# Patient Record
Sex: Male | Born: 1940
Health system: Southern US, Community
[De-identification: ages and names within clinical notes are randomized; demographics above are authoritative.]

## PROBLEM LIST (undated history)

## (undated) DIAGNOSIS — Z87442 Personal history of urinary calculi: Secondary | ICD-10-CM

## (undated) DIAGNOSIS — N400 Enlarged prostate without lower urinary tract symptoms: Secondary | ICD-10-CM

## (undated) DIAGNOSIS — I10 Essential (primary) hypertension: Secondary | ICD-10-CM

## (undated) DIAGNOSIS — N200 Calculus of kidney: Secondary | ICD-10-CM

## (undated) DIAGNOSIS — I2510 Atherosclerotic heart disease of native coronary artery without angina pectoris: Secondary | ICD-10-CM

## (undated) DIAGNOSIS — C801 Malignant (primary) neoplasm, unspecified: Secondary | ICD-10-CM

## (undated) DIAGNOSIS — Z85820 Personal history of malignant melanoma of skin: Secondary | ICD-10-CM

## (undated) DIAGNOSIS — N201 Calculus of ureter: Secondary | ICD-10-CM

## (undated) DIAGNOSIS — E119 Type 2 diabetes mellitus without complications: Secondary | ICD-10-CM

## (undated) DIAGNOSIS — Z9889 Other specified postprocedural states: Secondary | ICD-10-CM

## (undated) DIAGNOSIS — E785 Hyperlipidemia, unspecified: Secondary | ICD-10-CM

## (undated) HISTORY — DX: Calculus of kidney: N20.0

## (undated) HISTORY — PX: CARDIAC CATHETERIZATION: SHX172

## (undated) HISTORY — DX: Hyperlipidemia, unspecified: E78.5

## (undated) HISTORY — PX: TONSILLECTOMY: SUR1361

## (undated) HISTORY — DX: Malignant (primary) neoplasm, unspecified: C80.1

## (undated) HISTORY — DX: Essential (primary) hypertension: I10

## (undated) HISTORY — DX: Benign prostatic hyperplasia without lower urinary tract symptoms: N40.0

## (undated) NOTE — *Deleted (*Deleted)
Advanced Heart Failure Clinic Note   Referring Physician: PCP: Karie Schwalbe, MD PCP-Cardiologist: Nanetta Batty, MD  [facility]: Dr. Gala Romney   Reason for Visit: [facility] F/u, Chronic Systolic Heart Failure/ Ischemic Cardiomyopathy    HPI: Mr. Barret is a 25 y/o male w/ HTN, HLD, T2DM admitted 5/21 for LHC in the setting of new CP and abnormal NST. LHC showed severe multivessel CAD and he was referred for CABG. Preop echo showed moderately reduced LVEF, 35-40%, RV normal. No significant valvular disease. Underwent CABG x 3 (LIMA-LAD, SVG-diag, SVG-PLB) on 5/14 by Dr. Donata Clay.     Post operative course c/b atrial fibrillation + post cardiotomy shock requiring amiodarone and milrinone. Post op echo showed EF ~35% and mildly reduced RV systolic function.   I last saw him in 7/21.  He had been feeling great until he had mechanical fall with head trauma. We sent him for CT which was ok.    Echo 5/21: LVEF 35% w/ diffuse hypokinesis, RV systolic function mildly reduced   Review of Systems: Pertinent +/- outline in HPI    Past Medical History:  Diagnosis Date  . BPH (benign prostatic hypertrophy)   . Cancer (HCC)    melanoma  . Coronary artery disease   . History of kidney stones   . History of melanoma excision OCT 2010  . Hyperlipidemia   . Hypertension   . Left ureteral calculus   . Nephrolithiasis    bilateral  . Type 2 diabetes mellitus (HCC)     Current Outpatient Medications  Medication Sig Dispense Refill  . alum hydroxide-mag trisilicate (GAVISCON) 80-20 MG CHEW chewable tablet Chew 2 tablets by mouth 3 (three) times daily as needed for indigestion or heartburn.    Marland Kitchen aspirin EC 81 MG EC tablet Take 1 tablet (81 mg total) by mouth daily.    . Cholecalciferol (VITAMIN D3) 250 MCG (10000 UT) TABS Take 1 tablet by mouth 2 (two) times daily.    . Cyanocobalamin (VITAMIN B-12) 1000 MCG SUBL Place under the tongue.    . finasteride (PROSCAR) 5 MG tablet Take 1  tablet (5 mg total) by mouth daily. 90 tablet 3  . metFORMIN (GLUCOPHAGE-XR) 500 MG 24 hr tablet Take 1 tablet (500 mg total) by mouth 2 (two) times daily. 180 tablet 3  . Multiple Vitamin (MULTIVITAMIN) tablet Take 1 tablet by mouth daily.    Marland Kitchen omeprazole (PRILOSEC) 20 MG capsule Take 20 mg by mouth as needed.    . Potassium Citrate 15 MEQ (1620 MG) TBCR Take 1 tablet by mouth in the morning and at bedtime.    . rosuvastatin (CRESTOR) 10 MG tablet Take 10 mg by mouth daily.    . sacubitril-valsartan (ENTRESTO) 24-26 MG Take 1 tablet by mouth 2 (two) times daily. 60 tablet 3  . spironolactone (ALDACTONE) 25 MG tablet Take 1 tablet (25 mg total) by mouth daily. 30 tablet 3  . timolol (TIMOPTIC) 0.5 % ophthalmic solution Place 1 drop into both eyes 2 (two) times daily.      No current facility-administered medications for this encounter.    Allergies  Allergen Reactions  . Atorvastatin Other (See Comments)    myalgias  . Morphine And Related Other (See Comments)     hypotension  . Levofloxacin Rash  . Septra [Sulfamethoxazole-Trimethoprim] Rash      Social History   Socioeconomic History  . Marital status: Married    Spouse name: Not on file  . Number of  children: 2  . Years of education: Not on file  . Highest education level: Not on file  Occupational History  . 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 Occupation: Buys very distressed houses and rehabilitates  . Occupation: Does mediation  Tobacco Use  . Smoking status: Never Smoker  . Smokeless tobacco: Never Used  Substance and Sexual Activity  . Alcohol use: No  . Drug use: No  . Sexual activity: Not on file  Other Topics Concern  . Not on file  Social History Narrative   Has living will   Wife is health care POA---alternate is committee of physicians   Would accept resuscitation---wouldn't accept prolonged ventilation   No tube feeds if cognitively unaware   Social Determinants of Health   Financial  Resource Strain:   . Difficulty of Paying Living Expenses: Not on file  Food Insecurity:   . Worried About Programme researcher, broadcasting/film/video in the Last Year: Not on file  . Ran Out of Food in the Last Year: Not on file  Transportation Needs:   . Lack of Transportation (Medical): Not on file  . Lack of Transportation (Non-Medical): Not on file  Physical Activity:   . Days of Exercise per Week: Not on file  . Minutes of Exercise per Session: Not on file  Stress:   . Feeling of Stress : Not on file  Social Connections:   . Frequency of Communication with Friends and Family: Not on file  . Frequency of Social Gatherings with Friends and Family: Not on file  . Attends Religious Services: Not on file  . Active Member of Clubs or Organizations: Not on file  . Attends Banker Meetings: Not on file  . Marital Status: Not on file  Intimate Partner Violence:   . Fear of Current or Ex-Partner: Not on file  . Emotionally Abused: Not on file  . Physically Abused: Not on file  . Sexually Abused: Not on file      Family History  Problem Relation Age of Onset  . Hypertension Mother   . Diabetes Father   . Hypertension Father   . Colon polyps Brother   . Coronary artery disease Neg Hx   . Cancer Neg Hx   . Esophageal cancer Neg Hx   . Stomach cancer Neg Hx   . Rectal cancer Neg Hx     There were no vitals filed for this visit.   PHYSICAL EXAM: General:  Elderly male  HEENT: mild facial asymmetry with bruising on right temporal region Neck: supple. no JVD. Carotids 2+ bilat; no bruits. No lymphadenopathy or thyromegaly appreciated. Cor: PMI nondisplaced. Regular rate & rhythm. No rubs, gallops or murmurs. Lungs: clear Abdomen: soft, nontender, nondistended. No hepatosplenomegaly. No bruits or masses. Good bowel sounds. Extremities: no cyanosis, clubbing, rash, edema Neuro: alert & oriented x 3, cranial nerves grossly intact. moves all 4 extremities w/o difficulty. Affect pleasant.  Mild swaying with Romberg. Difficulty with heel-to-toe walk. FTN ok. No drift    ASSESSMENT & PLAN:  1. CAD - s/p CABG x 3 5/14 (LIMA-LAD, SVG-dig, SVG- PLB) - no s/s angina today - continue ASA + statin  - no ? blocker w/ recent shock. Can likely start soon - Attending CR   2.Chronic Systolic HF/ Ischemic Cardiomyopathy  - Echo 5/21 LVEF 35%-40%, RV systolic function mildly reduced, in the setting of multivessel CAD s/p CABG - Developed Post Cardiotomy Cardiogenic Shock following CABG and required short course of milrinone.   -  Volume status stable. Euvolemic on exam - NYHA Class II (prior to head injury) - Continue digoxin 0.125 mg. - Continue Entresto 24-26 bid. - Continue Spironolactone 25 mg daily  - Continue PRN Lasix 20 mg  - no ? blocker yet w/ recent shock  - consider addition of SGLT2i once Neuro situation stable    4. Post-operative Afib - maintaining NSR on amiodarone  - continue amio 100 daily (continuing for PVC suppression)  - no longer on Eliquis (d/c by PCP due to melena, now resolved). Colonoscopy 0/21 ok,    5. PVCs - noted to have frequent PVCs pos operatively  -  suppressed w/ amiodarone. Continue 100 mg daily   6. Head injury with imbalance  - unclear if due to ICH or post-concussive state. Will need stat head CT today.    Arvilla Meres, MD 05/12/20

---

## 1996-07-17 HISTORY — PX: PROSTATE BIOPSY: SHX241

## 1999-01-10 ENCOUNTER — Encounter: Payer: Self-pay | Admitting: General Surgery

## 1999-01-11 ENCOUNTER — Encounter: Payer: Self-pay | Admitting: General Surgery

## 1999-01-11 ENCOUNTER — Ambulatory Visit (HOSPITAL_COMMUNITY): Admission: RE | Admit: 1999-01-11 | Discharge: 1999-01-12 | Payer: Self-pay | Admitting: General Surgery

## 1999-01-15 HISTORY — PX: EXTRACORPOREAL SHOCK WAVE LITHOTRIPSY: SHX1557

## 1999-01-15 HISTORY — PX: CHOLECYSTECTOMY: SHX55

## 1999-01-28 ENCOUNTER — Encounter: Payer: Self-pay | Admitting: Urology

## 1999-01-28 ENCOUNTER — Ambulatory Visit (HOSPITAL_COMMUNITY): Admission: RE | Admit: 1999-01-28 | Discharge: 1999-01-28 | Payer: Self-pay | Admitting: Urology

## 1999-02-03 ENCOUNTER — Encounter: Payer: Self-pay | Admitting: Urology

## 1999-02-03 ENCOUNTER — Ambulatory Visit (HOSPITAL_COMMUNITY): Admission: RE | Admit: 1999-02-03 | Discharge: 1999-02-03 | Payer: Self-pay | Admitting: Urology

## 1999-04-21 ENCOUNTER — Encounter: Payer: Self-pay | Admitting: Urology

## 1999-04-21 ENCOUNTER — Ambulatory Visit (HOSPITAL_COMMUNITY): Admission: RE | Admit: 1999-04-21 | Discharge: 1999-04-21 | Payer: Self-pay | Admitting: Urology

## 2004-01-04 ENCOUNTER — Ambulatory Visit (HOSPITAL_COMMUNITY): Admission: RE | Admit: 2004-01-04 | Discharge: 2004-01-04 | Payer: Self-pay | Admitting: Urology

## 2004-02-03 ENCOUNTER — Ambulatory Visit (HOSPITAL_COMMUNITY): Admission: RE | Admit: 2004-02-03 | Discharge: 2004-02-03 | Payer: Self-pay | Admitting: *Deleted

## 2004-07-17 HISTORY — PX: ORIF CLAVICLE FRACTURE: SUR924

## 2005-06-15 ENCOUNTER — Emergency Department (HOSPITAL_COMMUNITY): Admission: EM | Admit: 2005-06-15 | Discharge: 2005-06-15 | Payer: Self-pay | Admitting: Emergency Medicine

## 2007-02-20 ENCOUNTER — Encounter: Admission: RE | Admit: 2007-02-20 | Discharge: 2007-02-20 | Payer: Self-pay | Admitting: Urology

## 2007-02-22 ENCOUNTER — Ambulatory Visit (HOSPITAL_BASED_OUTPATIENT_CLINIC_OR_DEPARTMENT_OTHER): Admission: RE | Admit: 2007-02-22 | Discharge: 2007-02-22 | Payer: Self-pay | Admitting: Urology

## 2007-02-22 HISTORY — PX: OTHER SURGICAL HISTORY: SHX169

## 2007-09-11 ENCOUNTER — Encounter: Admission: RE | Admit: 2007-09-11 | Discharge: 2007-09-11 | Payer: Self-pay | Admitting: Orthopaedic Surgery

## 2009-04-16 DIAGNOSIS — Z85820 Personal history of malignant melanoma of skin: Secondary | ICD-10-CM

## 2009-04-16 HISTORY — DX: Other specified postprocedural states: Z85.820

## 2009-09-14 LAB — HM DIABETES EYE EXAM

## 2009-12-17 ENCOUNTER — Encounter: Payer: Self-pay | Admitting: Internal Medicine

## 2010-01-11 ENCOUNTER — Ambulatory Visit: Payer: Self-pay | Admitting: Internal Medicine

## 2010-01-11 DIAGNOSIS — Z87442 Personal history of urinary calculi: Secondary | ICD-10-CM | POA: Insufficient documentation

## 2010-01-11 DIAGNOSIS — E785 Hyperlipidemia, unspecified: Secondary | ICD-10-CM | POA: Insufficient documentation

## 2010-01-11 DIAGNOSIS — I10 Essential (primary) hypertension: Secondary | ICD-10-CM | POA: Insufficient documentation

## 2010-01-11 DIAGNOSIS — E1121 Type 2 diabetes mellitus with diabetic nephropathy: Secondary | ICD-10-CM

## 2010-02-17 ENCOUNTER — Encounter: Payer: Self-pay | Admitting: Internal Medicine

## 2010-07-05 ENCOUNTER — Telehealth (INDEPENDENT_AMBULATORY_CARE_PROVIDER_SITE_OTHER): Payer: Self-pay | Admitting: *Deleted

## 2010-07-06 ENCOUNTER — Ambulatory Visit: Payer: Self-pay | Admitting: Internal Medicine

## 2010-07-06 LAB — CONVERTED CEMR LAB
Albumin: 4 g/dL (ref 3.5–5.2)
Basophils Absolute: 0 10*3/uL (ref 0.0–0.1)
CO2: 32 meq/L (ref 19–32)
Chloride: 102 meq/L (ref 96–112)
Cholesterol: 206 mg/dL — ABNORMAL HIGH (ref 0–200)
Creatinine,U: 182.7 mg/dL
Eosinophils Relative: 2.1 % (ref 0.0–5.0)
GFR calc non Af Amer: 58.47 mL/min — ABNORMAL LOW (ref >60.00)
HCT: 46.2 % (ref 39.0–52.0)
HDL: 33.8 mg/dL — ABNORMAL LOW (ref >39.00)
Hemoglobin: 15.7 g/dL (ref 13.0–17.0)
Lymphocytes Relative: 22.2 % (ref 12.0–46.0)
Lymphs Abs: 1.3 10*3/uL (ref 0.7–4.0)
Microalb Creat Ratio: 1.7 mg/g (ref 0.0–30.0)
Microalb, Ur: 3.1 mg/dL — ABNORMAL HIGH (ref 0.0–1.9)
Monocytes Relative: 9.5 % (ref 3.0–12.0)
Neutro Abs: 4 10*3/uL (ref 1.4–7.7)
PSA: 3.87 ng/mL (ref 0.10–4.00)
Potassium: 3.5 meq/L (ref 3.5–5.1)
RDW: 15.5 % — ABNORMAL HIGH (ref 11.5–14.6)
Sodium: 141 meq/L (ref 135–145)
TSH: 2.12 microintl units/mL (ref 0.35–5.50)
Total CHOL/HDL Ratio: 6
Total Protein: 6.6 g/dL (ref 6.0–8.3)
Triglycerides: 202 mg/dL — ABNORMAL HIGH (ref 0.0–149.0)
VLDL: 40.4 mg/dL — ABNORMAL HIGH (ref 0.0–40.0)
WBC: 6.1 10*3/uL (ref 4.5–10.5)

## 2010-07-13 ENCOUNTER — Telehealth (INDEPENDENT_AMBULATORY_CARE_PROVIDER_SITE_OTHER): Payer: Self-pay | Admitting: *Deleted

## 2010-07-13 ENCOUNTER — Ambulatory Visit: Payer: Self-pay | Admitting: Internal Medicine

## 2010-07-13 DIAGNOSIS — N400 Enlarged prostate without lower urinary tract symptoms: Secondary | ICD-10-CM

## 2010-07-13 LAB — HM DIABETES FOOT EXAM

## 2010-08-10 ENCOUNTER — Telehealth: Payer: Self-pay | Admitting: Internal Medicine

## 2010-08-16 NOTE — Letter (Signed)
Summary: Records Dated 02-20-07 thru 12-21-09/[facility]  Records Dated 02-20-07 thru 12-21-09/[facility]   Imported By: Lanelle Bal 03/17/2010 13:24:35  _____________________________________________________________________  External Attachment:    Type:   Image     Comment:   External Document

## 2010-08-16 NOTE — Assessment & Plan Note (Signed)
Summary: NEW PT TO EST/CLE   Vital Signs:  Patient profile:   70 year old male Height:      67 inches Weight:      171 pounds BMI:     26.88 Temp:     98.3 degrees F oral Pulse rate:   80 / minute Pulse rhythm:   regular BP sitting:   122 / 72  (left arm) Cuff size:   large  Vitals Entered By: Mervin Hack CMA Duncan Dull) (January 11, 2010 11:44 AM) CC: new patient to establish care   History of Present Illness: Lost doctor to retirement Lives close to here and doctor out at [place]  DM for 10-13 years working very hard to keep it under control  had slipped with eating and A1c up to 7.7% No hypoglycemic spells  Last chol showed slightly elevated triglycerides tried fish oil but had myalgias will be trying a different brand now  BPH  prostate biopsy negative in the 1990's unchanged nodule apparently no sig nocturia no sig daytime urgency  Several BP meds wonders if he takes too much No dizziness, etc  Multiple episodes of kidney stones Nothing recent  Preventive Screening-Counseling & Management  Alcohol-Tobacco     Smoking Status: never  Allergies (verified): 1)  ! Levaquin (Levofloxacin)  Past History:  Past Medical History: Diabetes mellitus, type II Hyperlipidemia Hypertension Nephrolithiasis, hx of-------------------------Dr Ottelin Melanoma  Past Surgical History: 1998 Prostate biopsy--negative 2000 Lithotripsy twice 2000 CHolecystectomy 2006 Left clavicle and rib fractures 2008 Kidney stones  2009 Right shoulder injury 2010 Melanoma right temple 2010 UTI  Family History: Dad died of lung problems @77   Had DM Mom died of metastatic breast cancer @79  2 brothers-- 1 died from cigarette, alcohol problems, etc No CAD ??HTN in parents also No colon or prostate cancer  Social History: 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 His daughter killed in MVA Never Smoked Alcohol use-no Occupation:  employed Smoking Status:  never  Review of Systems General:  weight down 5# recently from working harder sleeps fine wears seat belt. Eyes:  Denies double vision and vision loss-1 eye. ENT:  Denies decreased hearing and ringing in ears; teeth okay--regular with dentist. CV:  Denies chest pain or discomfort, difficulty breathing at night, difficulty breathing while lying down, fainting, lightheadness, palpitations, and shortness of breath with exertion. Resp:  Complains of cough; denies shortness of breath; occ cough from PND. GI:  Denies abdominal pain, bloody stools, change in bowel habits, dark tarry stools, indigestion, nausea, and vomiting. GU:  Denies nocturia, urinary frequency, and urinary hesitancy. MS:  Complains of muscle; denies joint pain and joint swelling; left leg pain for years--not clearly claudicaiton. Derm:  Complains of lesion(s); denies rash; new dark spot where melanoma was--going to derm today fungal toenails better with Vick's vaporub. Neuro:  Denies headaches, numbness, tingling, and weakness. Psych:  Denies anxiety and depression. Heme:  Denies abnormal bruising and enlarge lymph nodes. Allergy:  Denies seasonal allergies and sneezing.  Physical Exam  General:  alert and normal appearance.   Eyes:  pupils equal, pupils round, and pupils reactive to light.   Mouth:  no erythema and no exudates.   Neck:  supple, no masses, no thyromegaly, no carotid bruits, and no cervical lymphadenopathy.   Lungs:  normal respiratory effort and normal breath sounds.   Heart:  normal rate, regular rhythm, no murmur, and no  gallop.   Abdomen:  soft, non-tender, and no masses.   Msk:  normal ROM and no joint swelling.   Pulses:  1+ in feet Extremities:  no edema Neurologic:  alert & oriented X3, strength normal in all extremities, and gait normal.   Skin:  no rashes and no ulcerations.   Axillary  Nodes:  No palpable lymphadenopathy Psych:  normally interactive, good eye contact, not anxious appearing, and not depressed appearing.    Diabetes Management Exam:    Foot Exam (with socks and/or shoes not present):       Sensory-Pinprick/Light touch:          Left medial foot (L-4): normal          Left dorsal foot (L-5): normal          Left lateral foot (S-1): normal          Right medial foot (L-4): normal          Right dorsal foot (L-5): normal          Right lateral foot (S-1): normal       Inspection:          Left foot: abnormal             Comments: mild callous          Right foot: abnormal             Comments: mild callous       Nails:          Left foot: fungal infection          Right foot: fungal infection    Eye Exam:       Eye Exam done elsewhere          Date: 09/14/2009          Results: Early glaucoma, 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 Take 1 by mouth once daily    Metformin Hcl 500 Mg Tabs (Metformin hcl) .Marland Kitchen... Take 1 by mouth two times a day    Aspirin 81 Mg Tabs (Aspirin) .Marland Kitchen... Take 1 by mouth once daily  Problem # 2:  HYPERTENSION (ICD-401.9) Assessment: Unchanged good control ??wean in future  His updated medication list for this problem includes:    Triamterene-hctz 37.5-25 Mg Tabs (Triamterene-hctz) .Marland Kitchen... Take 1 by mouth once daily    Micardis 80 Mg Tabs (Telmisartan) .Marland Kitchen... Take 1 by mouth once daily    Dilt-xr 240 Mg Xr24h-cap (Diltiazem hcl) .Marland Kitchen... Take 1 by mouth once daily  BP today: 122/72  Problem # 3:  HYPERLIPIDEMIA (ICD-272.4) Assessment: Unchanged last LDL 81  His updated medication list for this problem includes:    Lipitor 10 Mg Tabs (Atorvastatin calcium)  .Marland Kitchen... Take 1 by mouth once daily  Problem # 4:  NEPHROLITHIASIS, HX OF (ICD-V13.01) Assessment: Comment Only on thiazide lots of fluids sees Dr Vernie Ammons  Complete Medication List: 1)  Triamterene-hctz 37.5-25 Mg Tabs (Triamterene-hctz) .... Take 1 by mouth once daily 2)  Finasteride 5 Mg Tabs (Finasteride) .... Take 1 by mouth once daily 3)  Micardis 80 Mg Tabs (Telmisartan) .... Take 1 by mouth once daily 4)  Metformin Hcl 500 Mg Tabs (  Metformin hcl) .... Take 1 by mouth two times a day 5)  Lipitor 10 Mg Tabs (Atorvastatin calcium) .... Take 1 by mouth once daily 6)  Aspirin 81 Mg Tabs (Aspirin) .... Take 1 by mouth once daily 7)  Dilt-xr 240 Mg Xr24h-cap (Diltiazem hcl) .... Take 1 by mouth once daily 8)  Klor-con 10 10 Meq Cr-tabs (Potassium chloride) .... Take 1 by mouth three times a day  Other Orders: Tdap => 80yrs IM (16109) Admin 1st Vaccine (60454) Admin 1st Vaccine [facility]) [phone])  Patient Instructions: 1)  Please get last 3 years of records from prior physician--plus colonoscopy and other radiology tests 2)  Please schedule a follow-up appointment in 6 months .   Current Allergies (reviewed today): ! LEVAQUIN (LEVOFLOXACIN)   Tetanus/Td Immunization History:    Tetanus/Td # 1:  Tdap (01/11/2010)  Influenza Immunization History:    Influenza # 1:  Historical (05/17/2009)  Pneumovax Immunization History:    Pneumovax # 1:  Historical (02/20/2006)  Hepatitis A Immunization History:    Hep A # 1:  Historical (06/17/2003)    Hep A # 2:  Historical (02/15/2004)  Other Immunization History:    Zostavax # 1:  Zostavax (06/28/2005)  Tetanus/Td Vaccine    Vaccine Type: Tdap    Site: left deltoid    Mfr: GlaxoSmithKline    Dose: 0.5 ml    Route: IM    Given by: Mervin Hack CMA (AAMA)    Exp. Date: 10/08/2011    Lot #: [id number]    VIS given: 06/04/07 version given January 11, 2010.   Colonoscopy  Procedure date:  01/18/2004  Findings:       Results:  Normal.

## 2010-08-16 NOTE — Progress Notes (Signed)
Summary: Patient Problem & Med List  Patient Problem & Med List   Imported By: Lanelle Bal 01/18/2010 12:07:32  _____________________________________________________________________  External Attachment:    Type:   Image     Comment:   External Document

## 2010-08-16 NOTE — Letter (Signed)
Summary: Patient Questionnaire  Patient Questionnaire   Imported By: Beau Fanny 01/12/2010 11:03:45  _____________________________________________________________________  External Attachment:    Type:   Image     Comment:   External Document

## 2010-08-18 NOTE — Progress Notes (Signed)
Summary: refill request for micardis  Phone Note Refill Request Call back at Home Phone [phone] Message from:  Patient  Refills Requested: Medication #1:  MICARDIS 80 MG TABS take 1 by mouth once daily Pt's insurance has changed and he is asking for a written script to send to mail order.  Please call when ready.  Initial call taken by: Lowella Petties CMA, AAMA,  August 10, 2010 4:02 PM  Follow-up for Phone Call        Rx written Follow-up by: Cindee Salt MD,  August 11, 2010 12:33 PM  Additional Follow-up for Phone Call Additional follow up Details #1::        left message on machine that rx ready for pick-up  Additional Follow-up by: DeShannon Smith CMA Duncan Dull),  August 11, 2010 1:56 PM    Prescriptions: MICARDIS 80 MG TABS (TELMISARTAN) take 1 by mouth once daily  #90 x 3   Entered and Authorized by:   Cindee Salt MD   Signed by:   Cindee Salt MD on 08/11/2010   Method used:   Print then Give to Patient   RxID:   [id number]

## 2010-08-18 NOTE — Progress Notes (Signed)
----   Converted from flag ---- ---- 07/04/2010 1:32 PM, Cindee Salt MD wrote: lipid, hepatic--- 272.4 renal, TSH, CBC with diff--  401.9 HgbA1c, urine microal-- 250.00  ---- 07/04/2010 12:46 PM, Liane Comber CMA (AAMA) wrote: Lab orders please! Good Morning! This pt is scheduled for cpx labs Wed, which labs to draw and dx codes to use? Pt says you gave him permission to have labs prior to appt. Thanks Tasha ------------------------------

## 2010-08-18 NOTE — Assessment & Plan Note (Signed)
Summary: 6 MONTH FOLLOW UP/[facility]   Vital Signs:  Patient profile:   70 year old male Weight:      170 pounds Temp:     98.3 degrees F oral BP sitting:   140 / 82  (left arm) Cuff size:   large  Vitals Entered By: Mervin Hack CMA Duncan Dull) (July 13, 2010 11:02 AM) CC: 6  month follow-up   History of Present Illness: Doing fairly well  Has a cluster of kidney stones on right may need surgery to get them out has failed past lithotripsy  Hopes to cut out maltodextrin in sugar free products Doesn't regularly check sugars No hypoglycemia   stopped the lipitor due to myalgias feels better now that he is off that [facility]  Has noted that his semen production has gone down  Wonders about cutting down on proscar Voids fine started years ago as start of prostate prevention strategy  BP has been fairly low (like at other doctors) No chest pain No SOB  Allergies: 1)  ! Levaquin (Levofloxacin) 2)  Lipitor (Atorvastatin Calcium)  Past History:  Past medical, surgical, family and social histories (including risk factors) reviewed for relevance to current acute and chronic problems.  Past Medical History: Diabetes mellitus, type II Hyperlipidemia Hypertension Nephrolithiasis, hx of-------------------------Dr Dahlstedt Melanoma Benign prostatic hypertrophy  Past Surgical History: 1998 Prostate biopsy--negative 2000 Lithotripsy twice 2000 CHolecystectomy 2006 Left clavicle and rib fractures 2008 Kidney stones  2009 Right shoulder injury 2010 Melanoma right temple 2010 UTI 2010 Melanoma -left forehead  Family History: Reviewed history from 01/11/2010 and no changes required. Dad died of lung problems @77   Had DM Mom died of metastatic breast cancer @79  2 brothers-- 1 died from cigarette, alcohol problems, etc No CAD ??HTN in parents also No colon or prostate cancer  Social History: Reviewed history from 01/11/2010  and no changes required. 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 His daughter killed in MVA Never Smoked Alcohol use-no  Review of Systems       weight is stable sleeping well  Physical Exam  General:  alert and normal appearance.   Neck:  supple, no masses, no thyromegaly, no carotid bruits, and no cervical lymphadenopathy.   Lungs:  normal respiratory effort, no intercostal retractions, no accessory muscle use, and normal breath sounds.   Heart:  normal rate, regular rhythm, no murmur, and no gallop.   Pulses:  1+ in feet Extremities:  no edema Skin:  no suspicious lesions and no ulcerations.   Psych:  normally interactive, good eye contact, not anxious appearing, and not depressed appearing.    Diabetes Management Exam:    Foot Exam (with socks and/or shoes not present):       Sensory-Pinprick/Light touch:          Left medial foot (L-4): normal          Left dorsal foot (L-5): normal          Left lateral foot (S-1): normal          Right medial foot (L-4): normal          Right dorsal foot (L-5): normal          Right lateral foot (S-1): normal       Inspection:          Left foot: abnormal  Comments: early plantar callous          Right foot: normal       Nails:          Left foot: thickened          Right foot: thickened   Impression & Recommendations:  Problem # 1:  DIABETES MELLITUS, TYPE II (ICD-250.00) Assessment Unchanged good control he will continue to work on lifestyle  His updated medication list for this problem includes:    Micardis 80 Mg Tabs (Telmisartan) .Marland Kitchen... Take 1 by mouth once daily    Metformin Hcl 500 Mg Tabs (Metformin hcl) .Marland Kitchen... Take 1 by mouth two times a day    Aspirin 81 Mg Tabs (Aspirin) .Marland Kitchen... Take 1 by mouth once daily  Labs Reviewed: Creat: 1.3 (07/06/2010)     Last Eye Exam: Early glaucoma, no  retinopathy (09/14/2009) Reviewed HgBA1c results: 6.8 (07/06/2010)  Problem # 2:  HYPERTENSION (ICD-401.9) Assessment: Unchanged reasonable control no changes  His updated medication list for this problem includes:    Triamterene-hctz 37.5-25 Mg Tabs (Triamterene-hctz) .Marland Kitchen... Take 1 by mouth once daily    Micardis 80 Mg Tabs (Telmisartan) .Marland Kitchen... Take 1 by mouth once daily    Dilt-xr 240 Mg Xr24h-cap (Diltiazem hcl) .Marland Kitchen... Take 1 by mouth once daily  BP today: 140/82 Prior BP: 122/72 (01/11/2010)  Labs Reviewed: K+: 3.5 (07/06/2010) Creat: : 1.3 (07/06/2010)   Chol: 206 (07/06/2010)   HDL: 33.80 (07/06/2010)   TG: 202.0 (07/06/2010)  Problem # 3:  HYPERLIPIDEMIA (ICD-272.4) Assessment: Comment Only clear cut myalgias on lipitor will work on lifestyle  consider pravastatin next time if LDL still >130  The following medications were removed from the medication list:    Lipitor 10 Mg Tabs (Atorvastatin calcium) .Marland Kitchen... Take 1 by mouth once daily  Labs Reviewed: SGOT: 26 (07/06/2010)   SGPT: 28 (07/06/2010)   HDL:33.80 (07/06/2010)  Chol:206 (07/06/2010)  Trig:202.0 (07/06/2010)  Problem # 4:  BENIGN PROSTATIC HYPERTROPHY (ICD-600.00) Assessment: Improved no symptoms will try to wean finasteride  Complete Medication List: 1)  Triamterene-hctz 37.5-25 Mg Tabs (Triamterene-hctz) .... Take 1 by mouth once daily 2)  Finasteride 5 Mg Tabs (Finasteride) .... Take 1 by mouth once every other day 3)  Micardis 80 Mg Tabs (Telmisartan) .... Take 1 by mouth once daily 4)  Metformin Hcl 500 Mg Tabs (Metformin hcl) .... Take 1 by mouth two times a day 5)  Aspirin 81 Mg Tabs (Aspirin) .... Take 1 by mouth once daily 6)  Dilt-xr 240 Mg Xr24h-cap (Diltiazem hcl) .... Take 1 by mouth once daily 7)  Klor-con 10 10 Meq Cr-tabs (Potassium chloride) .... Take 1 by mouth three times a day  Patient Instructions: 1)  Okay to try the finasteride every other day 2)  Please schedule a follow-up  appointment in 6 months for Medicare Wellness visit   Orders Added: 1)  Est. Patient Level IV [14782]    Current Allergies (reviewed today): ! LEVAQUIN (LEVOFLOXACIN) LIPITOR (ATORVASTATIN CALCIUM)

## 2010-08-18 NOTE — Progress Notes (Signed)
Summary: Repeated PSA per pts request-pt aware may hv to pay out of pocke  Phone Note Outgoing Call   Summary of Call: Pt request a PSA repeated in . Per Dr. Alphonsus Sias if not elevated ins will not pay.  Told pt per Dr. Alphonsus Sias, pt wants the test and is willing to pay out of pocket.Daine Gip  July 13, 2010 1:57 PM  Initial call taken by: Daine Gip,  July 13, 2010 1:58 PM

## 2010-08-20 ENCOUNTER — Emergency Department (HOSPITAL_COMMUNITY): Payer: Medicare Other

## 2010-08-20 ENCOUNTER — Emergency Department (HOSPITAL_COMMUNITY)
Admission: EM | Admit: 2010-08-20 | Discharge: 2010-08-20 | Disposition: A | Discharge status: Home / Self Care | Payer: Medicare Other | Attending: Emergency Medicine | Admitting: Emergency Medicine

## 2010-08-20 ENCOUNTER — Encounter: Payer: Self-pay | Admitting: Internal Medicine

## 2010-08-20 DIAGNOSIS — E119 Type 2 diabetes mellitus without complications: Secondary | ICD-10-CM | POA: Insufficient documentation

## 2010-08-20 DIAGNOSIS — I10 Essential (primary) hypertension: Secondary | ICD-10-CM | POA: Insufficient documentation

## 2010-08-20 DIAGNOSIS — N1330 Unspecified hydronephrosis: Secondary | ICD-10-CM | POA: Insufficient documentation

## 2010-08-20 DIAGNOSIS — N201 Calculus of ureter: Secondary | ICD-10-CM | POA: Insufficient documentation

## 2010-08-20 DIAGNOSIS — R109 Unspecified abdominal pain: Secondary | ICD-10-CM | POA: Insufficient documentation

## 2010-08-20 LAB — CBC
MCHC: 35.1 g/dL (ref 30.0–36.0)
Platelets: 311 10*3/uL (ref 150–400)
RDW: 14.8 % (ref 11.5–15.5)
WBC: 10.1 10*3/uL (ref 4.0–10.5)

## 2010-08-20 LAB — POCT I-STAT, CHEM 8
Creatinine, Ser: 1.6 mg/dL — ABNORMAL HIGH (ref 0.4–1.5)
Glucose, Bld: 226 mg/dL — ABNORMAL HIGH (ref 70–99)
HCT: 47 % (ref 39.0–52.0)
Hemoglobin: 16 g/dL (ref 13.0–17.0)
Potassium: 3.6 mEq/L (ref 3.5–5.1)
Sodium: 139 mEq/L (ref 135–145)
TCO2: 25 mmol/L (ref 0–100)

## 2010-08-20 LAB — DIFFERENTIAL
Basophils Absolute: 0 10*3/uL (ref 0.0–0.1)
Basophils Relative: 0 % (ref 0–1)
Eosinophils Relative: 0 % (ref 0–5)
Lymphocytes Relative: 11 % — ABNORMAL LOW (ref 12–46)
Monocytes Absolute: 0.7 10*3/uL (ref 0.1–1.0)

## 2010-08-20 LAB — URINALYSIS, ROUTINE W REFLEX MICROSCOPIC
Nitrite: POSITIVE — AB
Protein, ur: NEGATIVE mg/dL
Specific Gravity, Urine: 1.024 (ref 1.005–1.030)
Urobilinogen, UA: 0.2 mg/dL (ref 0.0–1.0)

## 2010-08-20 LAB — URINE MICROSCOPIC-ADD ON

## 2010-08-21 LAB — URINE CULTURE: Culture: NO GROWTH

## 2010-08-29 ENCOUNTER — Telehealth: Payer: Self-pay | Admitting: Internal Medicine

## 2010-09-05 ENCOUNTER — Other Ambulatory Visit: Payer: Self-pay | Admitting: Urology

## 2010-09-05 DIAGNOSIS — N200 Calculus of kidney: Secondary | ICD-10-CM

## 2010-09-07 NOTE — Progress Notes (Signed)
Summary: CD of tests  Phone Note Call from Patient Call back at Home Phone [phone]   Caller: Patient Call For: Cindee Salt MD Summary of Call: Pt dropped off CD of chest x-ray and CT of abdomen, per pt he would like for Dr.Gelisa Tieken to look at to make sure everything is ok. Pt states he passed a kidney stone and now [facility] say they see something on the CT? pt would like a phone call, CD on your desk.  ( I didn't speak with pt, front desk spoke with pt) Initial call taken by: Mervin Hack CMA Duncan Dull),  August 29, 2010 9:44 AM  Follow-up for Phone Call        called home--wife gave me his cell number--  [phone]   He passed the obstructing stone on the left has urology appt tomorrow to discuss plans (surgery to clean out right, ?lithotripsy on left?)  Was told to hold breath on CT scan, lung abnormalities are probably just atelectasis Would just recheck CXR at some point Follow-up by: Cindee Salt MD,  August 29, 2010 2:12 PM

## 2010-09-21 ENCOUNTER — Encounter (HOSPITAL_COMMUNITY): Payer: Medicare Other

## 2010-09-21 ENCOUNTER — Other Ambulatory Visit: Payer: Self-pay | Admitting: Urology

## 2010-09-21 DIAGNOSIS — Z01810 Encounter for preprocedural cardiovascular examination: Secondary | ICD-10-CM | POA: Insufficient documentation

## 2010-09-21 LAB — BASIC METABOLIC PANEL
CO2: 29 mEq/L (ref 19–32)
Calcium: 9.6 mg/dL (ref 8.4–10.5)
GFR calc Af Amer: >60 mL/min (ref >60)
GFR calc non Af Amer: >60 mL/min (ref >60)
Potassium: 3.7 mEq/L (ref 3.5–5.1)
Sodium: 142 mEq/L (ref 135–145)

## 2010-09-21 LAB — SURGICAL PCR SCREEN: MRSA, PCR: NEGATIVE

## 2010-09-21 LAB — CBC
Hemoglobin: 14.4 g/dL (ref 13.0–17.0)
MCHC: 33.3 g/dL (ref 30.0–36.0)
WBC: 6.1 10*3/uL (ref 4.0–10.5)

## 2010-09-26 ENCOUNTER — Telehealth: Payer: Self-pay | Admitting: Internal Medicine

## 2010-09-28 ENCOUNTER — Ambulatory Visit (HOSPITAL_COMMUNITY): Payer: Medicare Other

## 2010-09-28 ENCOUNTER — Ambulatory Visit (HOSPITAL_COMMUNITY)
Admission: RE | Admit: 2010-09-28 | Discharge: 2010-09-30 | Disposition: A | Discharge status: Home / Self Care | Payer: Medicare Other | Source: Ambulatory Visit | Attending: Urology | Admitting: Urology

## 2010-09-28 ENCOUNTER — Ambulatory Visit (HOSPITAL_COMMUNITY)
Admission: RE | Admit: 2010-09-28 | Discharge: 2010-09-28 | Disposition: A | Discharge status: Home / Self Care | Payer: Medicare Other | Source: Ambulatory Visit | Attending: Urology | Admitting: Urology

## 2010-09-28 DIAGNOSIS — I10 Essential (primary) hypertension: Secondary | ICD-10-CM | POA: Insufficient documentation

## 2010-09-28 DIAGNOSIS — N200 Calculus of kidney: Secondary | ICD-10-CM

## 2010-09-28 DIAGNOSIS — E119 Type 2 diabetes mellitus without complications: Secondary | ICD-10-CM | POA: Insufficient documentation

## 2010-09-28 DIAGNOSIS — Z7982 Long term (current) use of aspirin: Secondary | ICD-10-CM | POA: Insufficient documentation

## 2010-09-28 DIAGNOSIS — Z79899 Other long term (current) drug therapy: Secondary | ICD-10-CM | POA: Insufficient documentation

## 2010-09-28 DIAGNOSIS — R82998 Other abnormal findings in urine: Secondary | ICD-10-CM | POA: Insufficient documentation

## 2010-09-28 DIAGNOSIS — Z01818 Encounter for other preprocedural examination: Secondary | ICD-10-CM | POA: Insufficient documentation

## 2010-09-28 DIAGNOSIS — Z01812 Encounter for preprocedural laboratory examination: Secondary | ICD-10-CM | POA: Insufficient documentation

## 2010-09-28 HISTORY — PX: NEPHROLITHOTOMY: SUR881

## 2010-09-28 LAB — TYPE AND SCREEN: Antibody Screen: NEGATIVE

## 2010-09-28 LAB — GLUCOSE, CAPILLARY
Glucose-Capillary: 138 mg/dL — ABNORMAL HIGH (ref 70–99)
Glucose-Capillary: 196 mg/dL — ABNORMAL HIGH (ref 70–99)

## 2010-09-28 LAB — ABO/RH: ABO/RH(D): O POS

## 2010-09-28 MED ORDER — IOHEXOL 300 MG/ML  SOLN: 50.0000 mL | Freq: Once | INTRAMUSCULAR | Status: AC | PRN

## 2010-09-29 ENCOUNTER — Ambulatory Visit (HOSPITAL_COMMUNITY): Payer: Medicare Other

## 2010-09-29 LAB — CBC
HCT: 35.3 % — ABNORMAL LOW (ref 39.0–52.0)
MCH: 30.1 pg (ref 26.0–34.0)
MCHC: 33.7 g/dL (ref 30.0–36.0)
MCV: 89.4 fL (ref 78.0–100.0)
Platelets: 227 10*3/uL (ref 150–400)
RDW: 15 % (ref 11.5–15.5)
WBC: 9.9 10*3/uL (ref 4.0–10.5)

## 2010-10-04 NOTE — Progress Notes (Signed)
Summary:   DILT-XR 240  Phone Note Refill Request Message [facility]:  [phone] on September 26, 2010 9:12 AM  Refills Requested: Medication #1:  DILT-XR 240 MG XR24H-CAP take 1 by mouth once daily   Last Refilled: 05/27/2010 E-Scribe Request, we've never filled before, ok to refill?   Method Requested: Electronic Initial call taken by: Mervin Hack CMA Duncan Dull),  September 26, 2010 9:13 AM  Follow-up for Phone Call        yes, okay to fill for 1 year Follow-up by: Cindee Salt MD,  September 26, 2010 10:01 AM  Additional Follow-up for Phone Call Additional follow up Details #1::        Rx faxed to pharmacy Additional Follow-up by: DeShannon Smith CMA Duncan Dull),  September 26, 2010 1:57 PM    Prescriptions: DILT-XR 240 MG XR24H-CAP (DILTIAZEM HCL) take 1 by mouth once daily  #90 x 3   Entered by:   Mervin Hack CMA (AAMA)   Authorized by:   Cindee Salt MD   Signed by:   Mervin Hack CMA (AAMA) on 09/26/2010   Method used:   [facility]        [facility]  [address] [phone] ([facility]ail)       [address]       [place], [place]  [address]       Ph: [phone] or [phone]       Fax: [phone]   RxID:   [id number]

## 2010-11-01 NOTE — Op Note (Signed)
NAME:  James Braun, James Braun NO.:  [id number]  MEDICAL RECORD NO.:  [id number]           PATIENT TYPE:  O  LOCATION:  DAYL                         FACILITY:  [facility]  PHYSICIAN:  Bertram Millard. Nikodem Leadbetter, M.D.DATE OF BIRTH:  1940/10/16  DATE OF PROCEDURE: DATE OF DISCHARGE:                              OPERATIVE REPORT   PREOPERATIVE DIAGNOSIS:  Multiple right renal calculi.  POSTOPERATIVE DIAGNOSIS:  Multiple right renal calculi.  PRINCIPAL PROCEDURE:  Right percutaneous nephrolithotomy.  SURGEON:  Bertram Millard. Elveria Lauderbaugh, M.D.  ANESTHESIA:  General endotracheal.  COMPLICATIONS:  None.  ESTIMATED BLOOD LOSS:  Less than 200 mL.  SPECIMENS:  Multiple stones, the family.  BRIEF HISTORY:  This 70 year old male has a history of recurrent urolithiasis.  He has multiple enlarging stones in right upper pole calyces.  He also has chronic pyuria.  It is felt that these stones may well be infected, and he is intermittently symptomatic.  Due to the large number of the stones, it was recommended that he undergo percutaneous nephrolithotomy if he were to be relieved of the stone burden.  I also discussed other options including lithotripsy and ureteroscopic extraction.  Because of the location and the size of the stones, I felt like percutaneous nephrolithotomy was the preferred method.  We talked about risks and complications of the procedure.  He understands these and desires to proceed.  DESCRIPTION OF PROCEDURE:  The patient was identified in the holding area.  He had undergone right percutaneous nephrostomy tube placement in the interventional radiology by Dr. Richarda Overlie.  He was then taken to the operating room where general anesthetic was administered.  The endotracheal device was placed.  He was then placed in the prone position after his bladder was catheterized.  All extremities were padded carefully.  His right flank was prepped and draped.  I then, through the  previously placed nephrostomy tube, eventually placed 2 guide wires through his ureter and into his pelvis using fluoroscopic guidance and a peel-away sheath.  The peel-away sheath was removed, and then I dilated his nephrostomy tract with a Trackmaster or NephroMax balloon under fluoroscopic guidance.  28-French nephrostomy sheath was then placed over top of the balloon and into the upper pole calyces. There was a fair amount of bleeding at first, but once this abated somewhat, multiple calculi were seen.  These were all grasped and extracted.  They were somewhat friable, but after approximately 45 minutes of work, the patient's calyces had been rendered stone free.  I saw no further calcifications using fluoroscopic guidance.  Small clots were picked out, and these had no fragments on them.  Inspection of the renal pelvis, upper ureter and all the calyces that I could get to, revealed no evidence of further stones.  At this point, an 18-French Council-tip catheter was placed over the working guidewire and guided into position in the upper pole calyces.  Antegrade nephrostogram was then performed.  There was minimal extravasation retrograde through the nephrostomy tract.  There was no evidence of extravasation on upper pole calyces or on the pelvis.  There was easy flow of contrast down  the ureter on the right.  At this point, 3 mL of water was placed in the catheter balloon.  The catheter was again found to be adequately positioned within the pelvis using fluoroscopy.  I then sutured the catheter to the skin with 2-0 silk sutures.  The nephrostomy sheath was then removed, and the catheter was left in place.  Dry sterile dressings were placed.  At this point, the procedure was terminated.  The patient was awakened after dry sterile dressing was placed.  Taken to PACU in stable condition.     Bertram Millard. Jazelle Achey, M.D.     SMD/MEDQ  D:  09/28/2010  T:  09/28/2010  Job:   [id number]  Electronically Signed by Marcine Matar M.D. on 11/01/2010 01:16:17 PM

## 2010-11-21 ENCOUNTER — Other Ambulatory Visit: Payer: Self-pay | Admitting: *Deleted

## 2010-11-21 MED ORDER — POTASSIUM CHLORIDE 10 MEQ PO TBCR: 30.0000 meq | EXTENDED_RELEASE_TABLET | Freq: Every day | ORAL | Status: DC

## 2010-11-22 ENCOUNTER — Other Ambulatory Visit: Payer: Self-pay | Admitting: *Deleted

## 2010-11-22 MED ORDER — POTASSIUM CHLORIDE 10 MEQ PO TBCR: 30.0000 meq | EXTENDED_RELEASE_TABLET | Freq: Every day | ORAL | Status: DC

## 2010-11-29 NOTE — Op Note (Signed)
NAME:  James Braun, James Braun                ACCOUNT NO.:  [id number]   MEDICAL RECORD NO.:  [id number]          PATIENT TYPE:  AMB   LOCATION:  [facility]                         FACILITY:  [facility]   PHYSICIAN:  Ronald L. Earlene Plater, M.D.  DATE OF BIRTH:  06-Aug-1940   DATE OF PROCEDURE:  02/22/2007  DATE OF DISCHARGE:  02/22/2007                               OPERATIVE REPORT   PREOPERATIVE DIAGNOSIS:  Left ureteral stone.   POSTOPERATIVE DIAGNOSIS:  Left ureteral stone.   PROCEDURE PERFORMED:  1. Cystoscopy.  2. Left retrograde pyelography.  3. Left uteroscopic stone manipulation with laser lithotripsy and      basket extraction.   SURGEON:  Dr. Gaynelle Arabian.   RESIDENT:  Melina Schools.   ANESTHESIA:  General.   INDICATION FOR PROCEDURE:  [name] is a 70 year old male who is noted  to have a left ureteral stone.  This was symptomatic.  He failed a trial  of conservative management.  He presents for surgical management.   DESCRIPTION OF PROCEDURE IN DETAIL:  The patient brought to the  operating room, identified by his arm band.  Informed consent was  verified, and preoperative time-out was performed.  After the successful  induction of general anesthesia, the patient is moved to the dorsal  lithotomy position.  All appropriate pressure points are padded and  confirmed to avoid compartment syndrome.  Perineum is prepped and  draped.  Surgeon changes gown and gloves.  __________ [place] scope  sheath is used to introduce the 12-degree cystoscope __________ in the  bladder.  Pancystourethroscopy was performed.  Bilateral ureteral  orifices were noted to be in normal anatomic position __________ .  The  remainder of the bladder was free of any mucosal lesions, erythema,  foreign bodies, diverticula, stones.  Attention was turned to the left  ureteral orifice.  It was cannulated with an end-hole catheter.  Contrast was injected, and the left retrograde pyelogram was performed.   Left retrograde  pyelogram revealed a filling defect in the mid ureter  consistent with stone.  There was proximal mild hydroureteronephrosis.  There were no additional filling defects.   Sensor wire was then advanced via the Seldinger technique into the renal  pelvis.  The scope and the end-hole catheter were removed.  Six French  short semi-rigid ureteroscope was driven under direct vision into the  ureter.  It was driven under direct vision to the level of the stone.  The stone was then systematically fragmented with the holmium laser.  The 270 micron fiber was set initially at 0.5 joules at 5 Hz and  increased to 0.62 at 8 Hz from maximum power of 4.8 watts.  Once the  stone was sufficiently fragmented, a nitinol basket was used to retrieve  all the stone fragments.  These were deposited in the bladder.  The  ureteroscope was then driven to a level of the UPJ, and there are no  additional stones seen.  The ureteroscope was removed.  A 6.6 double-J  ureteral stent was advanced over the wire into the renal pelvis, and the  wire was removed.  The bladder was drained.  The procedure was  terminated.  The patient tolerated the procedure well, and there were no  complications.  [name] was the attending primary responsible  physician who was present and participated in all aspects of the  procedure.   DISPOSITION:  The patient was extubated uneventfully and transferred  safely to the PACU.      Terie Purser, MD      Lucrezia Starch. Earlene Plater, M.D.  Electronically Signed    JH/MEDQ  D:  03/04/2007  T:  03/04/2007  Job:  [id number]

## 2010-12-13 ENCOUNTER — Other Ambulatory Visit: Payer: Self-pay | Admitting: *Deleted

## 2010-12-13 MED ORDER — TRIAMTERENE-HCTZ 37.5-25 MG PO TABS: 1.0000 | ORAL_TABLET | Freq: Every day | ORAL | Status: DC

## 2010-12-22 ENCOUNTER — Other Ambulatory Visit: Payer: Self-pay | Admitting: Internal Medicine

## 2010-12-22 DIAGNOSIS — E119 Type 2 diabetes mellitus without complications: Secondary | ICD-10-CM

## 2010-12-22 DIAGNOSIS — N400 Enlarged prostate without lower urinary tract symptoms: Secondary | ICD-10-CM

## 2010-12-22 DIAGNOSIS — I10 Essential (primary) hypertension: Secondary | ICD-10-CM

## 2010-12-22 DIAGNOSIS — E785 Hyperlipidemia, unspecified: Secondary | ICD-10-CM

## 2010-12-27 ENCOUNTER — Other Ambulatory Visit (INDEPENDENT_AMBULATORY_CARE_PROVIDER_SITE_OTHER): Payer: Medicare Other | Admitting: Internal Medicine

## 2010-12-27 DIAGNOSIS — I10 Essential (primary) hypertension: Secondary | ICD-10-CM

## 2010-12-27 DIAGNOSIS — N400 Enlarged prostate without lower urinary tract symptoms: Secondary | ICD-10-CM

## 2010-12-27 DIAGNOSIS — E119 Type 2 diabetes mellitus without complications: Secondary | ICD-10-CM

## 2010-12-27 DIAGNOSIS — E785 Hyperlipidemia, unspecified: Secondary | ICD-10-CM

## 2010-12-27 LAB — LIPID PANEL
Cholesterol: 216 mg/dL — ABNORMAL HIGH (ref 0–200)
Total CHOL/HDL Ratio: 6
VLDL: 36.4 mg/dL (ref 0.0–40.0)

## 2010-12-27 LAB — RENAL FUNCTION PANEL
Albumin: 4.2 g/dL (ref 3.5–5.2)
Chloride: 104 mEq/L (ref 96–112)
GFR: 55.88 mL/min — ABNORMAL LOW (ref >60.00)
Glucose, Bld: 124 mg/dL — ABNORMAL HIGH (ref 70–99)
Phosphorus: 3.1 mg/dL (ref 2.3–4.6)
Potassium: 3.3 mEq/L — ABNORMAL LOW (ref 3.5–5.1)

## 2010-12-27 LAB — HEPATIC FUNCTION PANEL
Albumin: 4.2 g/dL (ref 3.5–5.2)
Bilirubin, Direct: 0.1 mg/dL (ref 0.0–0.3)
Total Protein: 6.8 g/dL (ref 6.0–8.3)

## 2010-12-27 LAB — CBC WITH DIFFERENTIAL/PLATELET
Basophils Relative: 0.7 % (ref 0.0–3.0)
Eosinophils Relative: 2.5 % (ref 0.0–5.0)
Hemoglobin: 13.7 g/dL (ref 13.0–17.0)
Lymphocytes Relative: 25 % (ref 12.0–46.0)
Neutro Abs: 3.1 10*3/uL (ref 1.4–7.7)
Neutrophils Relative %: 62.5 % (ref 43.0–77.0)
RBC: 4.48 Mil/uL (ref 4.22–5.81)
WBC: 4.9 10*3/uL (ref 4.5–10.5)

## 2010-12-27 LAB — PSA: PSA: 1.37 ng/mL (ref 0.10–4.00)

## 2010-12-27 LAB — HEMOGLOBIN A1C: Hgb A1c MFr Bld: 6.7 % — ABNORMAL HIGH (ref 4.6–6.5)

## 2011-01-03 ENCOUNTER — Encounter: Payer: Self-pay | Admitting: Internal Medicine

## 2011-01-04 ENCOUNTER — Encounter: Payer: Self-pay | Admitting: Internal Medicine

## 2011-01-04 ENCOUNTER — Ambulatory Visit (INDEPENDENT_AMBULATORY_CARE_PROVIDER_SITE_OTHER): Payer: Medicare Other | Admitting: Internal Medicine

## 2011-01-04 DIAGNOSIS — N400 Enlarged prostate without lower urinary tract symptoms: Secondary | ICD-10-CM

## 2011-01-04 DIAGNOSIS — Z0000 Encounter for general adult medical examination without abnormal findings: Secondary | ICD-10-CM | POA: Insufficient documentation

## 2011-01-04 DIAGNOSIS — E119 Type 2 diabetes mellitus without complications: Secondary | ICD-10-CM

## 2011-01-04 DIAGNOSIS — I10 Essential (primary) hypertension: Secondary | ICD-10-CM

## 2011-01-04 DIAGNOSIS — E785 Hyperlipidemia, unspecified: Secondary | ICD-10-CM

## 2011-01-04 LAB — POTASSIUM: Potassium: 3.9 mEq/L (ref 3.5–5.1)

## 2011-01-04 NOTE — Assessment & Plan Note (Addendum)
I have personally reviewed the Medicare Annual Wellness questionnaire and have noted 1. The patient's medical and social history 2. Their use of alcohol, tobacco or illicit drugs 3. Their current medications and supplements 4. The patient's functional ability including ADL's, fall risks, home safety risks and hearing or visual             impairment. 5. Diet and physical activities 6. Evidence for depression or mood disorders  The patients weight, height, BMI and visual acuity have been recorded in the chart I have made referrals, counseling and provided education to the patient based review of the above and I have provided the pt with a written personalized care plan for preventive services.  I have provided you with a copy of your personalized plan for preventive services. Please take the time to review along with your updated medication list.  Doing well No concerns on the visit

## 2011-01-04 NOTE — Assessment & Plan Note (Signed)
BP Readings from Last 3 Encounters:  01/04/11 128/70  07/13/10 140/82  01/11/10 122/72   Good control No changes needed Will recheck potassium and increase dose if still low

## 2011-01-04 NOTE — Assessment & Plan Note (Signed)
Last LDL 136 Terrible myalgias on lipitor He will increase red yeast rice to 3 tabs daily On fish oil Consider different statin if LDL still over 130 after next visit

## 2011-01-04 NOTE — Progress Notes (Signed)
Subjective:    Patient ID: James Braun, male    DOB: 05/16/1941, 70 y.o.   MRN: [id number]  HPI Doing well Here for wellness exam Reviewed his form No concerns noted  UTD with imms PSA okay UTD with colon No other concerns based on the review  Went to proscar every other day He lost some hair then No sig sexual changes on lower dose so has gone back to daily  Doesn't check sugars No low sugar reactions No chest pain No SOB No edema  Current Outpatient Prescriptions on File Prior to Visit  Medication Sig Dispense Refill  . aspirin 81 MG tablet Take 81 mg by mouth daily.        Marland Kitchen diltiazem (DILACOR XR) 240 MG 24 hr capsule Take 240 mg by mouth daily.        . finasteride (PROSCAR) 5 MG tablet Take 5 mg by mouth daily.       . metFORMIN (GLUCOPHAGE) 500 MG tablet Take 500 mg by mouth 2 (two) times daily with a meal.        . potassium chloride (KLOR-CON 10) 10 MEQ CR tablet Take 3 tablets (30 mEq total) by mouth daily.  270 tablet  0  . telmisartan (MICARDIS) 80 MG tablet Take 80 mg by mouth daily.        Marland Kitchen triamterene-hydrochlorothiazide (MAXZIDE-25) 37.5-25 MG per tablet Take 1 tablet by mouth daily.  90 tablet  0   Past Medical History  Diagnosis Date  . Diabetes mellitus   . Hyperlipidemia   . Hypertension   . Nephrolithiasis   . Melanoma   . BPH (benign prostatic hypertrophy)   . Shoulder injury     Past Surgical History  Procedure Date  . Prostate biopsy     neg  . Lithotripsy     twice  . Cholecystectomy   . Orif clavicle fracture   . Rib fracture surgery   . Percutaneous nephrolithotripsy 2/12    on right    Family History  Problem Relation Age of Onset  . Hypertension Mother   . Diabetes Father   . Hypertension Father   . Coronary artery disease Neg Hx   . Cancer Neg Hx     History   Social History  . Marital Status: Married    Spouse Name: N/A    Number of Children: 2  . Years of Education: N/A   Occupational History  . 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 Buys very distressed houses and rehabilitates    Social History Main Topics  . Smoking status: Never Smoker   . Smokeless tobacco: Never Used  . Alcohol Use: No  . Drug Use: No  . Sexually Active: Not on file   Other Topics Concern  . Not on file   Social History Narrative  . No narrative on file   Review of Systems Appetite is good Weight is fairly stable Some left foot pain---better with looser shoes    Objective:   Physical Exam  Constitutional: He is oriented to person, place, and time. He appears well-developed and well-nourished. No distress.  Neck: Normal range of motion. Neck supple. No thyromegaly present.  Cardiovascular: Normal rate, regular rhythm, normal heart sounds and intact distal pulses.  Exam reveals no gallop.   No murmur heard. Pulmonary/Chest: Effort normal and breath sounds normal. No respiratory distress. He has no wheezes. He has no rales.  Abdominal: Soft. He exhibits no mass. There is no tenderness.  Musculoskeletal: Normal range of motion. He exhibits no edema and no tenderness.  Lymphadenopathy:    He has no cervical adenopathy.  Neurological: He is alert and oriented to person, place, and time. He exhibits normal muscle tone.       Kyra Searles, [place]" [phone] D-l-r-o-w Recall  3/3  Normal sensation in plantar feet  Skin: No rash noted.       Mycotic toenail on left great No lesions or ulcers  Psychiatric: He has a normal mood and affect. His behavior is normal. Judgment and thought content normal.          Assessment & Plan:

## 2011-01-04 NOTE — Assessment & Plan Note (Signed)
Lab Results  Component Value Date   HGBA1C 6.7* 12/27/2010   Doing well No changes needed

## 2011-01-04 NOTE — Assessment & Plan Note (Signed)
Back to daily finasteride

## 2011-02-28 ENCOUNTER — Telehealth: Payer: Self-pay | Admitting: *Deleted

## 2011-02-28 NOTE — Telephone Encounter (Signed)
Okay to make the switch and send 1 year Rx

## 2011-02-28 NOTE — Telephone Encounter (Signed)
Patient came by the office and left a note stating that he is now taking 3 tabs of 10 mEq Potassium Chloride each day to boost his Potassium levels.  He would like to switch that to 3 tabs of 10 mEq Potassium Citrate since th citrate will help him battle his creation of calcium oxitate kidney stones.  He says he will make an appt if necessary to discuss this if requested.  If you agree with the switch, please prescribe a 90 day prescription for pick up.  Please advise patient.

## 2011-03-01 MED ORDER — POTASSIUM CITRATE ER 10 MEQ (1080 MG) PO TBCR: 10.0000 meq | EXTENDED_RELEASE_TABLET | Freq: Three times a day (TID) | ORAL | Status: DC

## 2011-03-01 NOTE — Telephone Encounter (Signed)
Spoke with patient and advised results rx on your desk to be signed.

## 2011-03-02 NOTE — Telephone Encounter (Signed)
signed

## 2011-03-04 ENCOUNTER — Other Ambulatory Visit: Payer: Self-pay | Admitting: Internal Medicine

## 2011-03-06 ENCOUNTER — Other Ambulatory Visit: Payer: Self-pay | Admitting: *Deleted

## 2011-03-06 MED ORDER — TRIAMTERENE-HCTZ 37.5-25 MG PO TABS: 1.0000 | ORAL_TABLET | Freq: Every day | ORAL | Status: DC

## 2011-04-01 ENCOUNTER — Other Ambulatory Visit: Payer: Self-pay | Admitting: Internal Medicine

## 2011-05-01 LAB — CBC
HCT: 41.2
MCHC: 33.6
MCV: 88
Platelets: 381
RDW: 14.2 — ABNORMAL HIGH
WBC: 9

## 2011-05-01 LAB — BASIC METABOLIC PANEL
BUN: 32 — ABNORMAL HIGH
CO2: 28
Chloride: 93 — ABNORMAL LOW
Glucose, Bld: 115 — ABNORMAL HIGH
Potassium: 3.2 — ABNORMAL LOW

## 2011-05-30 ENCOUNTER — Ambulatory Visit: Payer: Medicare Other

## 2011-05-31 ENCOUNTER — Ambulatory Visit (INDEPENDENT_AMBULATORY_CARE_PROVIDER_SITE_OTHER): Payer: Medicare Other

## 2011-05-31 DIAGNOSIS — Z23 Encounter for immunization: Secondary | ICD-10-CM

## 2011-06-20 ENCOUNTER — Other Ambulatory Visit: Payer: Self-pay | Admitting: Internal Medicine

## 2011-06-20 DIAGNOSIS — E785 Hyperlipidemia, unspecified: Secondary | ICD-10-CM

## 2011-06-20 DIAGNOSIS — E119 Type 2 diabetes mellitus without complications: Secondary | ICD-10-CM

## 2011-06-26 ENCOUNTER — Other Ambulatory Visit (INDEPENDENT_AMBULATORY_CARE_PROVIDER_SITE_OTHER): Payer: Medicare Other

## 2011-06-26 DIAGNOSIS — E119 Type 2 diabetes mellitus without complications: Secondary | ICD-10-CM

## 2011-06-26 DIAGNOSIS — E785 Hyperlipidemia, unspecified: Secondary | ICD-10-CM

## 2011-06-26 LAB — LIPID PANEL: VLDL: 44.6 mg/dL — ABNORMAL HIGH (ref 0.0–40.0)

## 2011-06-26 LAB — LDL CHOLESTEROL, DIRECT: Direct LDL: 114.6 mg/dL

## 2011-07-03 ENCOUNTER — Ambulatory Visit (INDEPENDENT_AMBULATORY_CARE_PROVIDER_SITE_OTHER): Payer: Medicare Other | Admitting: Internal Medicine

## 2011-07-03 ENCOUNTER — Encounter: Payer: Self-pay | Admitting: Internal Medicine

## 2011-07-03 DIAGNOSIS — N400 Enlarged prostate without lower urinary tract symptoms: Secondary | ICD-10-CM

## 2011-07-03 DIAGNOSIS — I10 Essential (primary) hypertension: Secondary | ICD-10-CM

## 2011-07-03 DIAGNOSIS — E119 Type 2 diabetes mellitus without complications: Secondary | ICD-10-CM

## 2011-07-03 DIAGNOSIS — E785 Hyperlipidemia, unspecified: Secondary | ICD-10-CM

## 2011-07-03 NOTE — Progress Notes (Signed)
Subjective:    Patient ID: James Braun, male    DOB: 1941/07/06, 70 y.o.   MRN: [id number]  HPI Doing okay  Has had red rash on left calf---due for derm in 2 weeks  Has tried glucosamine and chondroitin for foot pain Feels it helps  Discussed cholesterol LDL was 114  Prefers no meds after discussion  Not checking sugars Tries to be careful with eating but cheats some Weight is up slightly  No chest pain No SOB Has chronic dry cough without change No sig edema  Voids okay On proscar daily Nocturia 0-1  Current Outpatient Prescriptions on File Prior to Visit  Medication Sig Dispense Refill  . aspirin 81 MG tablet Take 81 mg by mouth daily.        Marland Kitchen diltiazem (DILACOR XR) 240 MG 24 hr capsule Take 240 mg by mouth daily.        . finasteride (PROSCAR) 5 MG tablet TAKE 1 TABLET EVERY DAY  90 tablet  1  . metFORMIN (GLUCOPHAGE) 500 MG tablet Take 500 mg by mouth 2 (two) times daily with a meal.        . metFORMIN (GLUCOPHAGE-XR) 500 MG 24 hr tablet TAKE 1 TABLET TWICE A DAY  180 tablet  2  . potassium citrate (UROCIT-K) 10 MEQ (1080 MG) SR tablet Take 1 tablet (10 mEq total) by mouth 3 (three) times daily with meals.  270 tablet  3  . telmisartan (MICARDIS) 80 MG tablet Take 80 mg by mouth daily.        Marland Kitchen triamterene-hydrochlorothiazide (MAXZIDE-25) 37.5-25 MG per tablet Take 1 tablet by mouth daily.  90 tablet  3    Allergies  Allergen Reactions  . Atorvastatin     REACTION: myalgias  . Levofloxacin     REACTION: Rash    Past Medical History  Diagnosis Date  . Diabetes mellitus   . Hyperlipidemia   . Hypertension   . Nephrolithiasis   . Melanoma   . BPH (benign prostatic hypertrophy)   . Shoulder injury     Past Surgical History  Procedure Date  . Prostate biopsy     neg  . Lithotripsy     twice  . Cholecystectomy   . Orif clavicle fracture   . Rib fracture surgery   . Percutaneous nephrolithotripsy 2/12    on right    Family History  Problem  Relation Age of Onset  . Hypertension Mother   . Diabetes Father   . Hypertension Father   . Coronary artery disease Neg Hx   . Cancer Neg Hx     History   Social History  . Marital Status: Married    Spouse Name: N/A    Number of Children: 2  . Years of Education: N/A   Occupational History  . 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 Buys very distressed houses and rehabilitates    Social History Main Topics  . Smoking status: Never Smoker   . Smokeless tobacco: Never Used  . Alcohol Use: No  . Drug Use: No  . Sexually Active: Not on file   Other Topics Concern  . Not on file   Social History Narrative  . No narrative on file   =Review of Systems Sleeps okay Tries to be careful with eating Goes to gym 1 hour 3 days per week     Objective:   Physical Exam  Constitutional: He appears  well-developed and well-nourished. No distress.  Neck: Normal range of motion. Neck supple.  Cardiovascular: Normal rate, regular rhythm, normal heart sounds and intact distal pulses.  Exam reveals no gallop.   No murmur heard. Pulmonary/Chest: Effort normal and breath sounds normal. No respiratory distress. He has no wheezes. He has no rales.  Musculoskeletal: He exhibits no edema and no tenderness.  Lymphadenopathy:    He has no cervical adenopathy.  Neurological:       Normal sensation in plantar feet  Skin: Rash noted.       15mm circular red area with central thinning and yellowish change (?morphea)---okay to wait for derm  Psychiatric: He has a normal mood and affect. His behavior is normal. Judgment and thought content normal.          Assessment & Plan:

## 2011-07-03 NOTE — Assessment & Plan Note (Signed)
Voiding well on Rx Discussed no more PSA

## 2011-07-03 NOTE — Assessment & Plan Note (Signed)
BP Readings from Last 3 Encounters:  07/03/11 148/80  01/04/11 128/70  07/13/10 140/82   Generally has had good control No changes for now

## 2011-07-03 NOTE — Assessment & Plan Note (Signed)
Trouble with lipitor Doesn't want to try other med for now Concerned about side effects Will work on lifestyle

## 2011-07-03 NOTE — Assessment & Plan Note (Signed)
Still with good control Discussed lifestyle improvements  Lab Results  Component Value Date   HGBA1C 7.2* 06/26/2011

## 2011-07-21 DIAGNOSIS — L820 Inflamed seborrheic keratosis: Secondary | ICD-10-CM | POA: Diagnosis not present

## 2011-07-21 DIAGNOSIS — D235 Other benign neoplasm of skin of trunk: Secondary | ICD-10-CM | POA: Diagnosis not present

## 2011-07-21 DIAGNOSIS — Z85820 Personal history of malignant melanoma of skin: Secondary | ICD-10-CM | POA: Diagnosis not present

## 2011-07-21 DIAGNOSIS — L570 Actinic keratosis: Secondary | ICD-10-CM | POA: Diagnosis not present

## 2011-08-02 DIAGNOSIS — L259 Unspecified contact dermatitis, unspecified cause: Secondary | ICD-10-CM | POA: Diagnosis not present

## 2011-08-16 DIAGNOSIS — L259 Unspecified contact dermatitis, unspecified cause: Secondary | ICD-10-CM | POA: Diagnosis not present

## 2011-09-09 ENCOUNTER — Other Ambulatory Visit: Payer: Self-pay | Admitting: Internal Medicine

## 2011-09-23 ENCOUNTER — Other Ambulatory Visit: Payer: Self-pay | Admitting: Internal Medicine

## 2011-10-24 ENCOUNTER — Other Ambulatory Visit: Payer: Self-pay | Admitting: *Deleted

## 2011-10-24 MED ORDER — TELMISARTAN 80 MG PO TABS: 80.0000 mg | ORAL_TABLET | Freq: Every day | ORAL | Status: DC

## 2011-10-24 NOTE — Telephone Encounter (Signed)
rx coming from pharmacy in [place], Form on your desk to be faxed back manually.

## 2011-10-24 NOTE — Telephone Encounter (Signed)
I cannot prescribe outside this country. I can give him a written prescription and then he can send it to a pharmacy of his choice

## 2011-10-24 NOTE — Telephone Encounter (Signed)
Pt would like rx mailed to his home address, rx mailed

## 2011-11-17 DIAGNOSIS — H25099 Other age-related incipient cataract, unspecified eye: Secondary | ICD-10-CM | POA: Diagnosis not present

## 2011-11-17 DIAGNOSIS — E119 Type 2 diabetes mellitus without complications: Secondary | ICD-10-CM | POA: Diagnosis not present

## 2011-11-23 DIAGNOSIS — R310 Gross hematuria: Secondary | ICD-10-CM | POA: Diagnosis not present

## 2011-11-23 DIAGNOSIS — N200 Calculus of kidney: Secondary | ICD-10-CM | POA: Diagnosis not present

## 2011-12-12 DIAGNOSIS — N134 Hydroureter: Secondary | ICD-10-CM | POA: Diagnosis not present

## 2011-12-12 DIAGNOSIS — R310 Gross hematuria: Secondary | ICD-10-CM | POA: Diagnosis not present

## 2011-12-12 DIAGNOSIS — N1330 Unspecified hydronephrosis: Secondary | ICD-10-CM | POA: Diagnosis not present

## 2011-12-12 DIAGNOSIS — N201 Calculus of ureter: Secondary | ICD-10-CM | POA: Diagnosis not present

## 2011-12-12 DIAGNOSIS — N200 Calculus of kidney: Secondary | ICD-10-CM | POA: Diagnosis not present

## 2011-12-12 DIAGNOSIS — R1084 Generalized abdominal pain: Secondary | ICD-10-CM | POA: Diagnosis not present

## 2011-12-18 ENCOUNTER — Other Ambulatory Visit: Payer: Self-pay | Admitting: Internal Medicine

## 2011-12-20 DIAGNOSIS — R6882 Decreased libido: Secondary | ICD-10-CM | POA: Diagnosis not present

## 2011-12-20 DIAGNOSIS — N200 Calculus of kidney: Secondary | ICD-10-CM | POA: Diagnosis not present

## 2011-12-20 DIAGNOSIS — R310 Gross hematuria: Secondary | ICD-10-CM | POA: Diagnosis not present

## 2011-12-20 DIAGNOSIS — N201 Calculus of ureter: Secondary | ICD-10-CM | POA: Diagnosis not present

## 2011-12-25 ENCOUNTER — Other Ambulatory Visit (INDEPENDENT_AMBULATORY_CARE_PROVIDER_SITE_OTHER): Payer: Medicare Other

## 2011-12-25 DIAGNOSIS — Z125 Encounter for screening for malignant neoplasm of prostate: Secondary | ICD-10-CM | POA: Diagnosis not present

## 2011-12-25 DIAGNOSIS — I10 Essential (primary) hypertension: Secondary | ICD-10-CM

## 2011-12-25 DIAGNOSIS — E119 Type 2 diabetes mellitus without complications: Secondary | ICD-10-CM

## 2011-12-25 DIAGNOSIS — E785 Hyperlipidemia, unspecified: Secondary | ICD-10-CM | POA: Diagnosis not present

## 2011-12-25 LAB — BASIC METABOLIC PANEL
BUN: 24 mg/dL — ABNORMAL HIGH (ref 6–23)
Chloride: 103 mEq/L (ref 96–112)
GFR: 55.72 mL/min — ABNORMAL LOW (ref >60.00)
Glucose, Bld: 130 mg/dL — ABNORMAL HIGH (ref 70–99)
Potassium: 3.3 mEq/L — ABNORMAL LOW (ref 3.5–5.1)

## 2011-12-25 LAB — CBC WITH DIFFERENTIAL/PLATELET
Basophils Absolute: 0 10*3/uL (ref 0.0–0.1)
Eosinophils Absolute: 0.1 10*3/uL (ref 0.0–0.7)
HCT: 43.4 % (ref 39.0–52.0)
Lymphs Abs: 1.2 10*3/uL (ref 0.7–4.0)
MCV: 91.2 fl (ref 78.0–100.0)
Monocytes Absolute: 0.5 10*3/uL (ref 0.1–1.0)
Platelets: 286 10*3/uL (ref 150.0–400.0)
RDW: 15.2 % — ABNORMAL HIGH (ref 11.5–14.6)

## 2011-12-25 LAB — MICROALBUMIN / CREATININE URINE RATIO: Microalb, Ur: 8.8 mg/dL — ABNORMAL HIGH (ref 0.0–1.9)

## 2011-12-25 LAB — HEPATIC FUNCTION PANEL
ALT: 31 U/L (ref 0–53)
AST: 31 U/L (ref 0–37)
Total Bilirubin: 0.6 mg/dL (ref 0.3–1.2)

## 2011-12-25 LAB — LIPID PANEL
Cholesterol: 172 mg/dL (ref 0–200)
Triglycerides: 189 mg/dL — ABNORMAL HIGH (ref 0.0–149.0)
VLDL: 37.8 mg/dL (ref 0.0–40.0)

## 2011-12-26 LAB — PSA, MEDICARE: PSA: 1.88 ng/mL (ref <=4.00)

## 2011-12-29 ENCOUNTER — Encounter: Payer: Self-pay | Admitting: *Deleted

## 2012-01-04 ENCOUNTER — Encounter: Payer: Medicare Other | Admitting: Internal Medicine

## 2012-01-08 ENCOUNTER — Encounter: Payer: Medicare Other | Admitting: Internal Medicine

## 2012-01-12 ENCOUNTER — Other Ambulatory Visit: Payer: Medicare Other

## 2012-01-17 ENCOUNTER — Ambulatory Visit (INDEPENDENT_AMBULATORY_CARE_PROVIDER_SITE_OTHER): Payer: Medicare Other | Admitting: Internal Medicine

## 2012-01-17 ENCOUNTER — Encounter: Payer: Self-pay | Admitting: Internal Medicine

## 2012-01-17 VITALS — BP 118/80 | HR 101 | Temp 98.2°F | Ht 68.0 in | Wt 161.0 lb

## 2012-01-17 DIAGNOSIS — I10 Essential (primary) hypertension: Secondary | ICD-10-CM | POA: Diagnosis not present

## 2012-01-17 DIAGNOSIS — N400 Enlarged prostate without lower urinary tract symptoms: Secondary | ICD-10-CM

## 2012-01-17 DIAGNOSIS — E785 Hyperlipidemia, unspecified: Secondary | ICD-10-CM | POA: Diagnosis not present

## 2012-01-17 DIAGNOSIS — E119 Type 2 diabetes mellitus without complications: Secondary | ICD-10-CM

## 2012-01-17 DIAGNOSIS — Z0000 Encounter for general adult medical examination without abnormal findings: Secondary | ICD-10-CM

## 2012-01-17 NOTE — Assessment & Plan Note (Signed)
Lab Results  Component Value Date   LDLCALC 93 12/25/2011   At goal now with fitness efforts No Rx

## 2012-01-17 NOTE — Assessment & Plan Note (Signed)
Lab Results  Component Value Date   HGBA1C 6.9* 12/25/2011   Excellent control No changes needed

## 2012-01-17 NOTE — Progress Notes (Signed)
Subjective:    Patient ID: James Braun, male    DOB: September 01, 1940, 71 y.o.   MRN: [id number]  HPI Here for Medicare Wellness Vision and hearing are fine. Recent diabetic eye exam No depression or anhedonia Reviewed advanced directives Reviewed all medical providers No falls or instability  Has noted decrease in semen---almost none Went to [facility] Testosterone borderline  PSA was fine I recommended no action  Really exercising well A1c is down Weight is down also Not checking sugars--recommended checking 1-2 times per month  Lab Results  Component Value Date   LDLCALC 93 12/25/2011   Cholesterol levels are fine He doesn't need meds (and wouldn't accept them)  No trouble voiding Did have kidney stone since last visit Got pain meds and he passed Nocturia at most once No urgency or increased frequency  No chest pain No SOB Does have "allergic tickle" in throat. Cough drops help. Relates to sinus drainage. Predates the temisartan  Current Outpatient Prescriptions on File Prior to Visit  Medication Sig Dispense Refill  . aspirin 81 MG tablet Take 81 mg by mouth daily.        Marland Kitchen diltiazem (CARDIZEM CD) 240 MG 24 hr capsule TAKE ONE CAPSULE BY MOUTH EVERY DAY  90 capsule  3  . finasteride (PROSCAR) 5 MG tablet TAKE 1 TABLET EVERY DAY  90 tablet  1  . metFORMIN (GLUCOPHAGE-XR) 500 MG 24 hr tablet TAKE 1 TABLET TWICE A DAY  180 tablet  2  . potassium citrate (UROCIT-K) 10 MEQ (1080 MG) SR tablet Take 1 tablet (10 mEq total) by mouth 3 (three) times daily with meals.  270 tablet  3  . telmisartan (MICARDIS) 80 MG tablet Take 1 tablet (80 mg total) by mouth daily.  90 tablet  3  . triamterene-hydrochlorothiazide (MAXZIDE-25) 37.5-25 MG per tablet Take 1 tablet by mouth daily.  90 tablet  3  . DISCONTD: metFORMIN (GLUCOPHAGE) 500 MG tablet Take 500 mg by mouth 2 (two) times daily with a meal.          Allergies  Allergen Reactions  . Atorvastatin     REACTION:  myalgias  . Levofloxacin     REACTION: Rash    Past Medical History  Diagnosis Date  . Diabetes mellitus   . Hyperlipidemia   . Hypertension   . Nephrolithiasis   . Melanoma   . BPH (benign prostatic hypertrophy)   . Shoulder injury     Past Surgical History  Procedure Date  . Prostate biopsy     neg  . Lithotripsy     twice  . Cholecystectomy   . Orif clavicle fracture   . Rib fracture surgery   . Percutaneous nephrolithotripsy 2/12    on right    Family History  Problem Relation Age of Onset  . Hypertension Mother   . Diabetes Father   . Hypertension Father   . Coronary artery disease Neg Hx   . Cancer Neg Hx     History   Social History  . Marital Status: Married    Spouse Name: N/A    Number of Children: 2  . Years of Education: N/A   Occupational History  . 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 Buys very distressed houses and rehabilitates    Social History Main Topics  . Smoking status: Never Smoker   . Smokeless tobacco: Never Used  . Alcohol Use: No  .  Drug Use: No  . Sexually Active: Not on file   Other Topics Concern  . Not on file   Social History Narrative   Has living willWife is health care POA---alternate is committee of physiciansWould accept resuscitation---wouldn't accept prolonged ventilationNo tube feeds if cognitively unaware   Review of Systems Sleeps fine Bowels are reasonable--no blood    Objective:   Physical Exam  Constitutional: He is oriented to person, place, and time. He appears well-developed and well-nourished. No distress.  HENT:  Mouth/Throat: Oropharynx is clear and moist. No oropharyngeal exudate.  Neck: Normal range of motion. Neck supple. No thyromegaly present.  Cardiovascular: Normal rate, regular rhythm, normal heart sounds and intact distal pulses.  Exam reveals no gallop.   No murmur heard. Pulmonary/Chest: Effort normal and breath sounds normal. No respiratory  distress. He has no wheezes. He has no rales.  Abdominal: Soft. There is no tenderness.  Musculoskeletal: He exhibits no edema and no tenderness.  Lymphadenopathy:    He has no cervical adenopathy.  Neurological: He is alert and oriented to person, place, and time.       President "[employer], [place]" [phone] [id number] Recall 3/3  Skin: No rash noted. No erythema.  Psychiatric: He has a normal mood and affect. His behavior is normal.          Assessment & Plan:

## 2012-01-17 NOTE — Assessment & Plan Note (Signed)
BP Readings from Last 3 Encounters:  01/17/12 118/80  07/03/11 148/80  01/04/11 128/70   Much better since excellent fitness efforts No changes needed Lab Results  Component Value Date   CREATININE 1.3 12/25/2011

## 2012-01-17 NOTE — Assessment & Plan Note (Signed)
Mild No Rx indicated Discussed decreased semen---I don't recommend any intervention

## 2012-01-25 DIAGNOSIS — L570 Actinic keratosis: Secondary | ICD-10-CM | POA: Diagnosis not present

## 2012-01-25 DIAGNOSIS — Z85820 Personal history of malignant melanoma of skin: Secondary | ICD-10-CM | POA: Diagnosis not present

## 2012-01-25 DIAGNOSIS — I781 Nevus, non-neoplastic: Secondary | ICD-10-CM | POA: Diagnosis not present

## 2012-01-25 DIAGNOSIS — D235 Other benign neoplasm of skin of trunk: Secondary | ICD-10-CM | POA: Diagnosis not present

## 2012-02-02 DIAGNOSIS — N200 Calculus of kidney: Secondary | ICD-10-CM | POA: Diagnosis not present

## 2012-03-07 ENCOUNTER — Other Ambulatory Visit: Payer: Self-pay | Admitting: Internal Medicine

## 2012-04-02 ENCOUNTER — Other Ambulatory Visit: Payer: Self-pay | Admitting: *Deleted

## 2012-04-02 MED ORDER — POTASSIUM CITRATE ER 10 MEQ (1080 MG) PO TBCR: 10.0000 meq | EXTENDED_RELEASE_TABLET | Freq: Three times a day (TID) | ORAL | Status: DC

## 2012-04-12 DIAGNOSIS — L570 Actinic keratosis: Secondary | ICD-10-CM | POA: Diagnosis not present

## 2012-04-12 DIAGNOSIS — Z85820 Personal history of malignant melanoma of skin: Secondary | ICD-10-CM | POA: Diagnosis not present

## 2012-04-22 DIAGNOSIS — M7750 Other enthesopathy of unspecified foot: Secondary | ICD-10-CM | POA: Diagnosis not present

## 2012-04-22 DIAGNOSIS — M2140 Flat foot [pes planus] (acquired), unspecified foot: Secondary | ICD-10-CM | POA: Diagnosis not present

## 2012-04-29 DIAGNOSIS — R82998 Other abnormal findings in urine: Secondary | ICD-10-CM | POA: Diagnosis not present

## 2012-04-29 DIAGNOSIS — N200 Calculus of kidney: Secondary | ICD-10-CM | POA: Diagnosis not present

## 2012-05-27 ENCOUNTER — Telehealth: Payer: Self-pay

## 2012-05-27 ENCOUNTER — Ambulatory Visit (INDEPENDENT_AMBULATORY_CARE_PROVIDER_SITE_OTHER): Payer: Medicare Other

## 2012-05-27 DIAGNOSIS — Z23 Encounter for immunization: Secondary | ICD-10-CM

## 2012-05-27 NOTE — Telephone Encounter (Signed)
Pt saw article in magazine(article at Dee's desk);should pt get a hep B vaccination since he is diabetic.Please advise.

## 2012-05-27 NOTE — Telephone Encounter (Signed)
I actually recommend it for anyone Not sure that his diagnosis of diabetes puts him at any increased risk (this is not a general recommendation I don't think unless you are a health care worker)  He should check with his insurance to see if it is covered We may just have the combined Hep B & A--wouldn't be bad it he got both

## 2012-05-27 NOTE — Telephone Encounter (Signed)
Spoke with patient and advised results, he will check with his insurance, the [place] and the health dept for cost of the vaccine. I advised I couldn't quote a price.

## 2012-06-05 DIAGNOSIS — R310 Gross hematuria: Secondary | ICD-10-CM | POA: Diagnosis not present

## 2012-07-03 ENCOUNTER — Ambulatory Visit (INDEPENDENT_AMBULATORY_CARE_PROVIDER_SITE_OTHER): Payer: Medicare Other | Admitting: *Deleted

## 2012-07-03 DIAGNOSIS — Z23 Encounter for immunization: Secondary | ICD-10-CM | POA: Diagnosis not present

## 2012-07-04 DIAGNOSIS — R310 Gross hematuria: Secondary | ICD-10-CM | POA: Diagnosis not present

## 2012-07-24 DIAGNOSIS — B351 Tinea unguium: Secondary | ICD-10-CM | POA: Diagnosis not present

## 2012-07-26 DIAGNOSIS — D235 Other benign neoplasm of skin of trunk: Secondary | ICD-10-CM | POA: Diagnosis not present

## 2012-07-26 DIAGNOSIS — Z85820 Personal history of malignant melanoma of skin: Secondary | ICD-10-CM | POA: Diagnosis not present

## 2012-08-31 ENCOUNTER — Other Ambulatory Visit: Payer: Self-pay | Admitting: Internal Medicine

## 2012-09-03 ENCOUNTER — Ambulatory Visit: Payer: Medicare Other

## 2012-09-04 ENCOUNTER — Ambulatory Visit (INDEPENDENT_AMBULATORY_CARE_PROVIDER_SITE_OTHER): Payer: Medicare Other | Admitting: *Deleted

## 2012-09-04 DIAGNOSIS — Z23 Encounter for immunization: Secondary | ICD-10-CM

## 2012-09-13 ENCOUNTER — Other Ambulatory Visit: Payer: Self-pay | Admitting: Internal Medicine

## 2012-09-30 ENCOUNTER — Other Ambulatory Visit: Payer: Self-pay

## 2012-09-30 MED ORDER — TELMISARTAN 80 MG PO TABS: 80.0000 mg | ORAL_TABLET | Freq: Every day | ORAL | Status: DC

## 2012-09-30 NOTE — Telephone Encounter (Signed)
Pt left v/m requesting written rx for Micardis. pts wife will be in office this afternoon and pt would like for wife to pick up rx then.Please advise.

## 2012-09-30 NOTE — Telephone Encounter (Signed)
Will give to wife at her OV

## 2012-09-30 NOTE — Telephone Encounter (Signed)
Printed but probably can be sent electronically (unless going out of the country)

## 2012-10-21 DIAGNOSIS — N200 Calculus of kidney: Secondary | ICD-10-CM | POA: Diagnosis not present

## 2012-10-21 DIAGNOSIS — R82998 Other abnormal findings in urine: Secondary | ICD-10-CM | POA: Diagnosis not present

## 2012-11-07 DIAGNOSIS — N201 Calculus of ureter: Secondary | ICD-10-CM | POA: Diagnosis not present

## 2012-11-07 DIAGNOSIS — R3129 Other microscopic hematuria: Secondary | ICD-10-CM | POA: Diagnosis not present

## 2012-11-07 DIAGNOSIS — N200 Calculus of kidney: Secondary | ICD-10-CM | POA: Diagnosis not present

## 2012-11-07 DIAGNOSIS — N1330 Unspecified hydronephrosis: Secondary | ICD-10-CM | POA: Diagnosis not present

## 2012-11-11 ENCOUNTER — Other Ambulatory Visit: Payer: Self-pay | Admitting: Urology

## 2012-11-12 ENCOUNTER — Encounter (HOSPITAL_BASED_OUTPATIENT_CLINIC_OR_DEPARTMENT_OTHER): Payer: Self-pay | Admitting: *Deleted

## 2012-11-12 NOTE — Progress Notes (Signed)
NPO  AFTER MN. ARRIVES AT 0600. NEEDS KUB, ISTAT, AND EKG. WILL TAKE CARDIZEM AND MICARDIS AM OF SURG W/ SIP OF WATER.

## 2012-11-13 DIAGNOSIS — N201 Calculus of ureter: Secondary | ICD-10-CM | POA: Diagnosis not present

## 2012-11-13 NOTE — H&P (Signed)
Urology History and Physical Exam  CC: Kidney stone  HPI: 72 year old male presents for anesthetic cystoscopy and left uresteroscopic stone extraction for a symptomatic left distal ureteral stone. I saw him for routine followup in early April, 2014. He had a left lower pole stone at the time that was asymptomatic. He then presented to our office on 4/24 with LLQ pain and LUTS. KUB the and yesterday revealed a new probable left UVJ stone. He presents now for removal. He is aware of risks/complications and desires to proceed.  PMH: Past Medical History  Diagnosis Date  . Diabetes mellitus   . Hyperlipidemia   . Hypertension   . BPH (benign prostatic hypertrophy)   . History of melanoma excision OCT 2010  . History of kidney stones   . Nephrolithiasis BILATERAL    CURRENT RIGHT RENAL CALCULI  . Left ureteral calculus     PSH: Past Surgical History  Procedure Laterality Date  . Prostate biopsy  1998    neg  . Orif clavicle fracture  2006  . Cholecystectomy  july 2000  . Nephrolithotomy Right 09-28-2010    percutaneous  . Left ureteroscopic stone extraction  02-22-2007  . Tonsillectomy    . Extracorporeal shock wave lithotripsy  JULY 2000    X2    Allergies: Allergies  Allergen Reactions  . Atorvastatin Other (See Comments)    REACTION: myalgias  . Morphine And Related Other (See Comments)    HYPOTENTION  . Levofloxacin Rash  . Septra (Sulfamethoxazole-Tmp Ds) Rash    Medications: No prescriptions prior to admission     Social History: History   Social History  . Marital Status: Married    Spouse Name: N/A    Number of Children: 2  . Years of Education: N/A   Occupational History  . 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 Buys very distressed houses and rehabilitates    Social History Main Topics  . Smoking status: Never Smoker   . Smokeless tobacco: Never Used  . Alcohol Use: No  . Drug Use: No  . Sexually  Active: Not on file   Other Topics Concern  . Not on file   Social History Narrative   Has living will   Wife is health care POA---alternate is committee of physicians   Would accept resuscitation---wouldn't accept prolonged ventilation   No tube feeds if cognitively unaware    Family History: Family History  Problem Relation Age of Onset  . Hypertension Mother   . Diabetes Father   . Hypertension Father   . Coronary artery disease Neg Hx   . Cancer Neg Hx     Review of Systems: Positive: LUTS Negative:   A further 10 point review of systems was negative except what is listed in the HPI.  Physical Exam: @VITALS2 @ General: No acute distress.  Awake. Head:  Normocephalic.  Atraumatic. ENT:  EOMI.  Mucous membranes moist Neck:  Supple.  No lymphadenopathy. CV:  S1 present. S2 present. Regular rate. Pulmonary: Equal effort bilaterally.  Clear to auscultation bilaterally. Abdomen: Soft.  Non tender to palpation. Skin:  Normal turgor.  No visible rash. Extremity: No gross deformity of bilateral upper extremities.  No gross deformity of    bilateral lower extremities. Neurologic: Alert. Appropriate mood.    Studies:  No results found for this basename: HGB, WBC, PLT,  in the last 72 hours  No results found for this basename: NA,  K, CL, CO2, BUN, CREATININE, CALCIUM, MAGNESIUM, GFRNONAA, GFRAA,  in the last 72 hours   No results found for this basename: PT, INR, APTT,  in the last 72 hours   No components found with this basename: ABG,     Assessment:  Left UVJ stone  Plan: Left ureteroscopic stone extraction

## 2012-11-14 ENCOUNTER — Ambulatory Visit (HOSPITAL_COMMUNITY): Payer: Medicare Other

## 2012-11-14 ENCOUNTER — Ambulatory Visit (HOSPITAL_BASED_OUTPATIENT_CLINIC_OR_DEPARTMENT_OTHER): Payer: Medicare Other | Admitting: Anesthesiology

## 2012-11-14 ENCOUNTER — Encounter (HOSPITAL_BASED_OUTPATIENT_CLINIC_OR_DEPARTMENT_OTHER): Payer: Self-pay | Admitting: *Deleted

## 2012-11-14 ENCOUNTER — Ambulatory Visit (HOSPITAL_BASED_OUTPATIENT_CLINIC_OR_DEPARTMENT_OTHER)
Admission: RE | Admit: 2012-11-14 | Discharge: 2012-11-14 | Disposition: A | Discharge status: Home / Self Care | Payer: Medicare Other | Source: Ambulatory Visit | Attending: Urology | Admitting: Urology

## 2012-11-14 ENCOUNTER — Encounter (HOSPITAL_BASED_OUTPATIENT_CLINIC_OR_DEPARTMENT_OTHER)
Admission: RE | Disposition: A | Discharge status: Home / Self Care | Payer: Self-pay | Source: Ambulatory Visit | Attending: Urology

## 2012-11-14 ENCOUNTER — Encounter (HOSPITAL_BASED_OUTPATIENT_CLINIC_OR_DEPARTMENT_OTHER): Payer: Self-pay | Admitting: Anesthesiology

## 2012-11-14 ENCOUNTER — Other Ambulatory Visit: Payer: Self-pay

## 2012-11-14 ENCOUNTER — Other Ambulatory Visit (HOSPITAL_COMMUNITY): Payer: Medicare Other

## 2012-11-14 DIAGNOSIS — Z85820 Personal history of malignant melanoma of skin: Secondary | ICD-10-CM | POA: Diagnosis not present

## 2012-11-14 DIAGNOSIS — Z888 Allergy status to other drugs, medicaments and biological substances status: Secondary | ICD-10-CM | POA: Insufficient documentation

## 2012-11-14 DIAGNOSIS — K219 Gastro-esophageal reflux disease without esophagitis: Secondary | ICD-10-CM | POA: Insufficient documentation

## 2012-11-14 DIAGNOSIS — Z885 Allergy status to narcotic agent status: Secondary | ICD-10-CM | POA: Diagnosis not present

## 2012-11-14 DIAGNOSIS — N400 Enlarged prostate without lower urinary tract symptoms: Secondary | ICD-10-CM | POA: Insufficient documentation

## 2012-11-14 DIAGNOSIS — N201 Calculus of ureter: Secondary | ICD-10-CM

## 2012-11-14 DIAGNOSIS — E119 Type 2 diabetes mellitus without complications: Secondary | ICD-10-CM | POA: Insufficient documentation

## 2012-11-14 DIAGNOSIS — N200 Calculus of kidney: Secondary | ICD-10-CM | POA: Diagnosis not present

## 2012-11-14 DIAGNOSIS — I10 Essential (primary) hypertension: Secondary | ICD-10-CM | POA: Insufficient documentation

## 2012-11-14 DIAGNOSIS — Z883 Allergy status to other anti-infective agents status: Secondary | ICD-10-CM | POA: Insufficient documentation

## 2012-11-14 DIAGNOSIS — Z01818 Encounter for other preprocedural examination: Secondary | ICD-10-CM | POA: Diagnosis not present

## 2012-11-14 DIAGNOSIS — E785 Hyperlipidemia, unspecified: Secondary | ICD-10-CM | POA: Diagnosis not present

## 2012-11-14 HISTORY — PX: CYSTOSCOPY/RETROGRADE/URETEROSCOPY/STONE EXTRACTION WITH BASKET: SHX5317

## 2012-11-14 HISTORY — DX: Calculus of ureter: N20.1

## 2012-11-14 HISTORY — PX: CYSTOSCOPY WITH STENT PLACEMENT: SHX5790

## 2012-11-14 HISTORY — DX: Personal history of malignant melanoma of skin: Z85.820

## 2012-11-14 HISTORY — DX: Personal history of urinary calculi: Z87.442

## 2012-11-14 HISTORY — DX: Other specified postprocedural states: Z98.890

## 2012-11-14 HISTORY — PX: HOLMIUM LASER APPLICATION: SHX5852

## 2012-11-14 LAB — POCT I-STAT 4, (NA,K, GLUC, HGB,HCT): Glucose, Bld: 132 mg/dL — ABNORMAL HIGH (ref 70–99)

## 2012-11-14 SURGERY — CYSTOSCOPY, WITH CALCULUS REMOVAL USING BASKET
Anesthesia: General | Site: Ureter | Laterality: Left | Wound class: Clean Contaminated

## 2012-11-14 MED ORDER — SODIUM CHLORIDE 0.9 % IR SOLN
Status: DC | PRN
  Administered 2012-11-14: 6000 mL via INTRAVESICAL

## 2012-11-14 MED ORDER — PROPOFOL 10 MG/ML IV BOLUS
INTRAVENOUS | Status: DC | PRN
  Administered 2012-11-14: 200 mg via INTRAVENOUS

## 2012-11-14 MED ORDER — CEFAZOLIN SODIUM-DEXTROSE 2-3 GM-% IV SOLR
2.0000 g | INTRAVENOUS | Status: AC
  Administered 2012-11-14: 2 g via INTRAVENOUS
  Filled 2012-11-14: qty 50

## 2012-11-14 MED ORDER — FENTANYL CITRATE 0.05 MG/ML IJ SOLN
25.0000 ug | INTRAMUSCULAR | Status: DC | PRN
  Filled 2012-11-14: qty 1

## 2012-11-14 MED ORDER — CEPHALEXIN 500 MG PO CAPS: 500.0000 mg | ORAL_CAPSULE | Freq: Two times a day (BID) | ORAL | Status: DC

## 2012-11-14 MED ORDER — CEFAZOLIN SODIUM 1-5 GM-% IV SOLN
1.0000 g | INTRAVENOUS | Status: DC
  Filled 2012-11-14: qty 50

## 2012-11-14 MED ORDER — LIDOCAINE HCL (CARDIAC) 20 MG/ML IV SOLN
INTRAVENOUS | Status: DC | PRN
  Administered 2012-11-14: 75 mg via INTRAVENOUS

## 2012-11-14 MED ORDER — IOHEXOL 350 MG/ML SOLN
INTRAVENOUS | Status: DC | PRN
  Administered 2012-11-14: 10 mL via URETHRAL

## 2012-11-14 MED ORDER — LACTATED RINGERS IV SOLN
INTRAVENOUS | Status: DC
  Administered 2012-11-14 (×2): via INTRAVENOUS
  Administered 2012-11-14: 100 mL/h via INTRAVENOUS
  Filled 2012-11-14: qty 1000

## 2012-11-14 MED ORDER — FENTANYL CITRATE 0.05 MG/ML IJ SOLN
INTRAMUSCULAR | Status: DC | PRN
  Administered 2012-11-14 (×3): 25 ug via INTRAVENOUS
  Administered 2012-11-14: 50 ug via INTRAVENOUS
  Administered 2012-11-14: 25 ug via INTRAVENOUS

## 2012-11-14 MED ORDER — PROMETHAZINE HCL 25 MG/ML IJ SOLN
6.2500 mg | INTRAMUSCULAR | Status: DC | PRN
  Filled 2012-11-14: qty 1

## 2012-11-14 MED ORDER — EPHEDRINE SULFATE 50 MG/ML IJ SOLN
INTRAMUSCULAR | Status: DC | PRN
  Administered 2012-11-14: 10 mg via INTRAVENOUS

## 2012-11-14 MED ORDER — ONDANSETRON HCL 4 MG/2ML IJ SOLN
INTRAMUSCULAR | Status: DC | PRN
  Administered 2012-11-14: 4 mg via INTRAVENOUS

## 2012-11-14 SURGICAL SUPPLY — 39 items
ADAPTER CATH URET PLST 4-6FR (CATHETERS) IMPLANT
ADPR CATH URET STRL DISP 4-6FR (CATHETERS)
BAG DRAIN URO-CYSTO SKYTR STRL (DRAIN) ×3 IMPLANT
BAG DRN UROCATH (DRAIN) ×2
BASKET LASER NITINOL 1.9FR (BASKET) IMPLANT
BASKET STNLS GEMINI 4WIRE 3FR (BASKET) IMPLANT
BASKET ZERO TIP NITINOL 2.4FR (BASKET) IMPLANT
BRUSH URET BIOPSY 3F (UROLOGICAL SUPPLIES) IMPLANT
BSKT STON RTRVL 120 1.9FR (BASKET)
BSKT STON RTRVL GEM 120X11 3FR (BASKET)
BSKT STON RTRVL ZERO TP 2.4FR (BASKET)
CANISTER SUCT LVC 12 LTR MEDI- (MISCELLANEOUS) ×2 IMPLANT
CATH INTERMIT  6FR 70CM (CATHETERS) ×3 IMPLANT
CATH URET 5FR 28IN CONE TIP (BALLOONS)
CATH URET 5FR 28IN OPEN ENDED (CATHETERS) IMPLANT
CATH URET 5FR 70CM CONE TIP (BALLOONS) IMPLANT
CLOTH BEACON ORANGE TIMEOUT ST (SAFETY) ×3 IMPLANT
DRAPE CAMERA CLOSED 9X96 (DRAPES) ×3 IMPLANT
ELECT REM PT RETURN 9FT ADLT (ELECTROSURGICAL)
ELECTRODE REM PT RTRN 9FT ADLT (ELECTROSURGICAL) IMPLANT
GLOVE BIO SURGEON STRL SZ7 (GLOVE) ×2 IMPLANT
GLOVE BIO SURGEON STRL SZ8 (GLOVE) ×3 IMPLANT
GLOVE BIOGEL PI IND STRL 7.0 (GLOVE) ×1 IMPLANT
GLOVE BIOGEL PI INDICATOR 7.0 (GLOVE) ×1
GOWN PREVENTION PLUS LG XLONG (DISPOSABLE) ×3 IMPLANT
GOWN STRL REIN XL XLG (GOWN DISPOSABLE) ×3 IMPLANT
GUIDEWIRE 0.038 PTFE COATED (WIRE) IMPLANT
GUIDEWIRE ANG ZIPWIRE 038X150 (WIRE) IMPLANT
GUIDEWIRE STR DUAL SENSOR (WIRE) ×2 IMPLANT
IV NS IRRIG 3000ML ARTHROMATIC (IV SOLUTION) ×6 IMPLANT
KIT BALLIN UROMAX 15FX10 (LABEL) IMPLANT
KIT BALLN UROMAX 15FX4 (MISCELLANEOUS) IMPLANT
KIT BALLN UROMAX 26 75X4 (MISCELLANEOUS)
LASER FIBER DISP (UROLOGICAL SUPPLIES) ×4 IMPLANT
PACK CYSTOSCOPY (CUSTOM PROCEDURE TRAY) ×3 IMPLANT
SET HIGH PRES BAL DIL (LABEL)
SHEATH ACCESS URETERAL 38CM (SHEATH) ×2 IMPLANT
SHEATH ACCESS URETERAL 54CM (SHEATH) IMPLANT
STENT CONTOUR 6FRX24X.038 (STENTS) ×2 IMPLANT

## 2012-11-14 NOTE — Anesthesia Postprocedure Evaluation (Signed)
  Anesthesia Post-op Note  Patient: James Braun  Procedure(s) Performed: Procedure(s) (LRB): CYSTOSCOPY/RETROGRADE/URETEROSCOPY/STONE EXTRACTION WITH BASKET (Left) HOLMIUM LASER APPLICATION (Left) CYSTOSCOPY WITH STENT PLACEMENT (Left)  Patient Location: PACU  Anesthesia Type: General  Level of Consciousness: awake and alert   Airway and Oxygen Therapy: Patient Spontanous Breathing  Post-op Pain: mild  Post-op Assessment: Post-op Vital signs reviewed, Patient's Cardiovascular Status Stable, Respiratory Function Stable, Patent Airway and No signs of Nausea or vomiting  Last Vitals:  Filed Vitals:   11/14/12 0845  BP: 146/85  Pulse: 90  Temp:   Resp: 18    Post-op Vital Signs: stable   Complications: No apparent anesthesia complications

## 2012-11-14 NOTE — Anesthesia Preprocedure Evaluation (Addendum)
Anesthesia Evaluation  Patient identified by MRN, date of birth, ID band Patient awake  General Assessment Comment:PMH: Past Medical History   Diagnosis  Date   .  Diabetes mellitus     .  Hyperlipidemia     .  Hypertension     .  BPH (benign prostatic hypertrophy)     .  History of melanoma excision  OCT 2010   .  History of kidney stones     .  Nephrolithiasis  BILATERAL       CURRENT RIGHT RENAL CALCULI   .  Left ureteral calculus     Reviewed: Allergy & Precautions, H&P , NPO status , Patient's Chart, lab work & pertinent test results  Airway Mallampati: II TM Distance: >3 FB Neck ROM: Full    Dental no notable dental hx.    Pulmonary neg pulmonary ROS,  breath sounds clear to auscultation  Pulmonary exam normal       Cardiovascular Exercise Tolerance: Good hypertension, Pt. on medications Rhythm:Regular Rate:Normal  ECG: NSR, Q waves in III, aVf  Works out 3 times per week without symptoms.   Neuro/Psych negative neurological ROS  negative psych ROS   GI/Hepatic negative GI ROS, Neg liver ROS,   Endo/Other  diabetes, Type 2, Oral Hypoglycemic Agents  Renal/GU Renal disease  negative genitourinary   Musculoskeletal negative musculoskeletal ROS (+)   Abdominal   Peds negative pediatric ROS (+)  Hematology negative hematology ROS (+)   Anesthesia Other Findings   Reproductive/Obstetrics negative OB ROS                          Anesthesia Physical Anesthesia Plan  ASA: III  Anesthesia Plan: General   Post-op Pain Management:    Induction: Intravenous  Airway Management Planned: LMA  Additional Equipment:   Intra-op Plan:   Post-operative Plan: Extubation in OR  Informed Consent: I have reviewed the patients History and Physical, chart, labs and discussed the procedure including the risks, benefits and alternatives for the proposed anesthesia with the patient or  authorized representative who has indicated his/her understanding and acceptance.   Dental advisory given  Plan Discussed with: CRNA  Anesthesia Plan Comments: (Discussed his morphine "allergy". This event occurred in July 2000 when he had 3 procedures in one month. And at one time his blood pressure went down for a few minutes following morphine administration. No rash, no wheezing.)       Anesthesia Quick Evaluation

## 2012-11-14 NOTE — Transfer of Care (Signed)
Immediate Anesthesia Transfer of Care Note  Patient: James Braun  Procedure(s) Performed: Procedure(s) (LRB): CYSTOSCOPY/RETROGRADE/URETEROSCOPY/STONE EXTRACTION WITH BASKET (Left) HOLMIUM LASER APPLICATION (Left) CYSTOSCOPY WITH STENT PLACEMENT (Left)  Patient Location: Patient transported to PACU with oxygen via face mask at 4 Liters / Min  Anesthesia Type: General  Level of Consciousness: awake and alert   Airway & Oxygen Therapy: Patient Spontanous Breathing and Patient connected to face mask oxygen  Post-op Assessment: Report given to PACU RN and Post -op Vital signs reviewed and stable  Post vital signs: Reviewed and stable  Dentition: Teeth and oropharynx remain in pre-op condition  Complications: No apparent anesthesia complications

## 2012-11-14 NOTE — Anesthesia Procedure Notes (Signed)
Procedure Name: LMA Insertion Date/Time: 11/14/2012 7:36 AM Performed by: Fran Lowes Pre-anesthesia Checklist: Patient identified, Emergency Drugs available, Suction available and Patient being monitored Patient Re-evaluated:Patient Re-evaluated prior to inductionOxygen Delivery Method: Circle System Utilized Preoxygenation: Pre-oxygenation with 100% oxygen Intubation Type: IV induction Ventilation: Mask ventilation without difficulty LMA: LMA inserted LMA Size: 4.0 Number of attempts: 1 Airway Equipment and Method: bite block Placement Confirmation: positive ETCO2 Tube secured with: Tape Dental Injury: Teeth and Oropharynx as per pre-operative assessment

## 2012-11-14 NOTE — Op Note (Signed)
Preoperative diagnosis: Left distal ureteral stone  Postoperative diagnosis: Same   Procedure: Cystoscopy, left retrograde ureteropyelogram, left ureteroscopy, holmium laser and extraction of left distal ureteral stone, placement of double-J stent, interpretive fluoroscopy    Surgeon: Bertram Millard. Ariq Khamis, M.D.   Anesthesia: Gen.   Complications: None  Specimen(s): Stone fragments, the family  Drain(s): 24 cm x 6 French double-J stent with tether  Indications: 72 year-old male with persistent symptomatic left distal ureteral stone, approximately 6 mm in size. He presents at this time for stone extraction. Risks and complications of the procedure have been discussed with the patient in depth. He understands and desires to proceed.    Technique and findings: The patient was properly identified and marked in the holding area. He received preoperative IV antibiotics. He was taken the operating room where general anesthetic was administered with the LMA. He was placed in the dorsolithotomy position. Genitalia and perineum were prepped and draped. Timeout was then performed.  I then passed a 22 [place] panendoscope under direct vision through the urethra. Urethra was free of lesions, prostate was nonobstructive. Bladder was inspected circumferentially. No tumors trabeculations or foreign bodies were noted. Both ureteral orifices were normal in configuration and location, although there was a hint of some swelling of the left distal ureteral orifice secondary to the stone just proximal to this. I then performed a retrograde ureteropyelogram with a 6 [place] open-ended catheter. This revealed a filling defect in the left distal ureter with proximal hydroureter. No other filling defects were noted. I then negotiated a sensor-tip guidewire up to the left renal pelvis using fluoroscopic guidance. The cystoscope was then removed. I dilated the distal ureter with first the inner core and then the entire  ureteral access sheath over top of the guidewire. The sheath was then removed, and I passed a rigid ureteroscope up through the urethra, into the bladder and up to the ureteral stone. It tried negotiating a Dispensing optician around the stone, which was impossible is a stone was too big and too impacted in the distal ureter. I then removed the basket, and passed a 360  fiber and through this fiber passed laser energy using the holmium laser to fragment the stone into several smaller fragments  which were then extracted with the Nitinol basket.  There was a significant amount of edema at the left ureteral orifice following extraction of all the stones, so I thought placement of a stent temporarily was judicious. I then removed the ureteroscope, and using cystoscopic guidance, passed a 24 cm x 6 French contour stent leaving the string on the end of the stent, under fluoroscopic and cystoscopic guidance. Good proximal and distal curls were seen on the stent following extraction of the guidewire. The bladder was emptied, the scope removed, and the procedure terminated. The patient was awakened and then taken to the PACU in stable condition.

## 2012-11-15 ENCOUNTER — Encounter (HOSPITAL_BASED_OUTPATIENT_CLINIC_OR_DEPARTMENT_OTHER): Payer: Self-pay | Admitting: Urology

## 2012-11-30 ENCOUNTER — Other Ambulatory Visit: Payer: Self-pay | Admitting: Internal Medicine

## 2012-12-13 DIAGNOSIS — N201 Calculus of ureter: Secondary | ICD-10-CM | POA: Diagnosis not present

## 2012-12-13 DIAGNOSIS — R3129 Other microscopic hematuria: Secondary | ICD-10-CM | POA: Diagnosis not present

## 2012-12-25 ENCOUNTER — Ambulatory Visit (INDEPENDENT_AMBULATORY_CARE_PROVIDER_SITE_OTHER): Payer: Medicare Other | Admitting: *Deleted

## 2012-12-25 DIAGNOSIS — Z23 Encounter for immunization: Secondary | ICD-10-CM | POA: Diagnosis not present

## 2013-01-01 ENCOUNTER — Other Ambulatory Visit: Payer: Self-pay | Admitting: Internal Medicine

## 2013-01-01 ENCOUNTER — Ambulatory Visit: Payer: Medicare Other

## 2013-01-01 DIAGNOSIS — E1059 Type 1 diabetes mellitus with other circulatory complications: Secondary | ICD-10-CM

## 2013-01-01 DIAGNOSIS — E785 Hyperlipidemia, unspecified: Secondary | ICD-10-CM

## 2013-01-13 ENCOUNTER — Other Ambulatory Visit: Payer: Medicare Other

## 2013-01-15 DIAGNOSIS — E119 Type 2 diabetes mellitus without complications: Secondary | ICD-10-CM | POA: Diagnosis not present

## 2013-01-20 ENCOUNTER — Other Ambulatory Visit: Payer: Medicare Other

## 2013-01-20 ENCOUNTER — Encounter: Payer: Medicare Other | Admitting: Internal Medicine

## 2013-01-24 ENCOUNTER — Ambulatory Visit (INDEPENDENT_AMBULATORY_CARE_PROVIDER_SITE_OTHER): Payer: Medicare Other | Admitting: Internal Medicine

## 2013-01-24 ENCOUNTER — Encounter: Payer: Self-pay | Admitting: Internal Medicine

## 2013-01-24 VITALS — BP 140/82 | HR 84 | Temp 98.1°F | Wt 155.0 lb

## 2013-01-24 DIAGNOSIS — M25519 Pain in unspecified shoulder: Secondary | ICD-10-CM

## 2013-01-24 DIAGNOSIS — M25511 Pain in right shoulder: Secondary | ICD-10-CM

## 2013-01-24 NOTE — Assessment & Plan Note (Signed)
Seems to be just a strain Discussed heat Could try ibuprofen prn

## 2013-01-24 NOTE — Patient Instructions (Signed)
Please try ibuprofen 400mg  up to three times a day if your shoulder gets worse

## 2013-01-24 NOTE — Progress Notes (Signed)
Subjective:    Patient ID: James Braun, male    DOB: [birth year], 72 y.o.   MRN: [id number]  HPI Coming in later this month for Wellness visit  Has had some ongoing problems with right shoulder pain For about 3 weeks Also having pain in right knee---occasionally it makes him limp Goes back 7-10 days  Using glucosamine/chondroitin--may be helping some Has still been able to walk on treadmill Stopped the upper body machines in case that caused the shoulder problems  No swelling in knee or shoulder No falls or particular injury  Gets intermittent trouble with hiatal hernia About once a month it will "seize up" and keep him from eating (if he starts too quickly) No swallowing problems in general  Current Outpatient Prescriptions on File Prior to Visit  Medication Sig Dispense Refill  . aspirin 81 MG tablet Take 81 mg by mouth daily.       Marland Kitchen diltiazem (CARDIZEM CD) 240 MG 24 hr capsule TAKE ONE CAPSULE BY MOUTH EVERY DAY  90 capsule  0  . finasteride (PROSCAR) 5 MG tablet       . metFORMIN (GLUCOPHAGE-XR) 500 MG 24 hr tablet TAKE 1 TABLET TWICE A DAY  180 tablet  2  . Multiple Vitamin (MULTIVITAMIN) tablet Take 1 tablet by mouth daily.      . potassium citrate (UROCIT-K) 10 MEQ (1080 MG) SR tablet Take 1 tablet (10 mEq total) by mouth 3 (three) times daily with meals.  270 tablet  3  . Red Yeast Rice Extract (RED YEAST RICE PO) Take 1 tablet by mouth daily.       Marland Kitchen telmisartan (MICARDIS) 80 MG tablet Take 80 mg by mouth every morning.      . triamterene-hydrochlorothiazide (MAXZIDE-25) 37.5-25 MG per tablet        No current facility-administered medications on file prior to visit.    Allergies  Allergen Reactions  . Atorvastatin Other (See Comments)    REACTION: myalgias  . Morphine And Related Other (See Comments)    hypotension  . Levofloxacin Rash  . Septra (Sulfamethoxazole-Tmp Ds) Rash    Past Medical History  Diagnosis Date  . Diabetes mellitus   .  Hyperlipidemia   . Hypertension   . BPH (benign prostatic hypertrophy)   . History of melanoma excision OCT 2010  . History of kidney stones   . Nephrolithiasis BILATERAL    CURRENT RIGHT RENAL CALCULI  . Left ureteral calculus     Past Surgical History  Procedure Laterality Date  . Prostate biopsy  1998    neg  . Orif clavicle fracture  2006  . Cholecystectomy  july 2000  . Nephrolithotomy Right 09-28-2010    percutaneous  . Left ureteroscopic stone extraction  02-22-2007  . Tonsillectomy    . Extracorporeal shock wave lithotripsy  JULY 2000    X2  . Cystoscopy/retrograde/ureteroscopy/stone extraction with basket Left 11/14/2012    Procedure: CYSTOSCOPY/RETROGRADE/URETEROSCOPY/STONE EXTRACTION WITH BASKET;  Surgeon: Marcine Matar, MD;  Location[facility];  Service: Urology;  Laterality: Left;  . Holmium laser application Left 11/14/2012    Procedure: HOLMIUM LASER APPLICATION;  Surgeon: Marcine Matar, MD;  Location: [facility];  Service: Urology;  Laterality: Left;  . Cystoscopy with stent placement Left 11/14/2012    Procedure: CYSTOSCOPY WITH STENT PLACEMENT;  Surgeon: Marcine Matar, MD;  Location: [facility];  Service: Urology;  Laterality: Left;    Family History  Problem Relation Age of Onset  .  Hypertension Mother   . Diabetes Father   . Hypertension Father   . Coronary artery disease Neg Hx   . Cancer Neg Hx     History   Social History  . Marital Status: Married    Spouse Name: N/A    Number of Children: 2  . Years of Education: N/A   Occupational History  . 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 Buys very distressed houses and rehabilitates    Social History Main Topics  . Smoking status: Never Smoker   . Smokeless tobacco: Never Used  . Alcohol Use: No  . Drug Use: No  . Sexually Active: Not on file   Other Topics Concern  . Not on file   Social History  Narrative   Has living will   Wife is health care POA---alternate is committee of physicians   Would accept resuscitation---wouldn't accept prolonged ventilation   No tube feeds if cognitively unaware   Review of Systems Some scattered arm pain--nothing consistent Not ill No fever     Objective:   Physical Exam  Constitutional: He appears well-developed and well-nourished. No distress.  Musculoskeletal:  No crepitus in right shoulder Almost full passive ROM, fair abduction actively Pain with resisted adduction  Right knee without effusion, ligament or meniscus findings Normal ROM          Assessment & Plan:

## 2013-02-03 ENCOUNTER — Other Ambulatory Visit (INDEPENDENT_AMBULATORY_CARE_PROVIDER_SITE_OTHER): Payer: Medicare Other

## 2013-02-03 DIAGNOSIS — E785 Hyperlipidemia, unspecified: Secondary | ICD-10-CM | POA: Diagnosis not present

## 2013-02-03 DIAGNOSIS — E119 Type 2 diabetes mellitus without complications: Secondary | ICD-10-CM

## 2013-02-03 DIAGNOSIS — Z0000 Encounter for general adult medical examination without abnormal findings: Secondary | ICD-10-CM | POA: Diagnosis not present

## 2013-02-03 DIAGNOSIS — E1059 Type 1 diabetes mellitus with other circulatory complications: Secondary | ICD-10-CM | POA: Diagnosis not present

## 2013-02-03 DIAGNOSIS — I10 Essential (primary) hypertension: Secondary | ICD-10-CM

## 2013-02-03 LAB — COMPREHENSIVE METABOLIC PANEL
ALT: 22 U/L (ref 0–53)
Albumin: 4.3 g/dL (ref 3.5–5.2)
CO2: 27 mEq/L (ref 19–32)
Calcium: 9.2 mg/dL (ref 8.4–10.5)
Chloride: 104 mEq/L (ref 96–112)
GFR: 56.03 mL/min — ABNORMAL LOW (ref >60.00)
Sodium: 139 mEq/L (ref 135–145)
Total Protein: 7.3 g/dL (ref 6.0–8.3)

## 2013-02-03 LAB — LIPID PANEL: Total CHOL/HDL Ratio: 5

## 2013-02-04 ENCOUNTER — Encounter: Payer: Self-pay | Admitting: Internal Medicine

## 2013-02-04 DIAGNOSIS — B351 Tinea unguium: Secondary | ICD-10-CM | POA: Diagnosis not present

## 2013-02-04 DIAGNOSIS — Z85820 Personal history of malignant melanoma of skin: Secondary | ICD-10-CM | POA: Diagnosis not present

## 2013-02-04 DIAGNOSIS — L821 Other seborrheic keratosis: Secondary | ICD-10-CM | POA: Diagnosis not present

## 2013-02-04 DIAGNOSIS — D235 Other benign neoplasm of skin of trunk: Secondary | ICD-10-CM | POA: Diagnosis not present

## 2013-02-07 ENCOUNTER — Encounter: Payer: Self-pay | Admitting: Internal Medicine

## 2013-02-07 ENCOUNTER — Ambulatory Visit (INDEPENDENT_AMBULATORY_CARE_PROVIDER_SITE_OTHER): Payer: Medicare Other | Admitting: Internal Medicine

## 2013-02-07 VITALS — BP 140/80 | HR 85 | Temp 98.4°F | Wt 156.0 lb

## 2013-02-07 DIAGNOSIS — Z0000 Encounter for general adult medical examination without abnormal findings: Secondary | ICD-10-CM

## 2013-02-07 DIAGNOSIS — E1159 Type 2 diabetes mellitus with other circulatory complications: Secondary | ICD-10-CM

## 2013-02-07 DIAGNOSIS — E785 Hyperlipidemia, unspecified: Secondary | ICD-10-CM

## 2013-02-07 DIAGNOSIS — I10 Essential (primary) hypertension: Secondary | ICD-10-CM

## 2013-02-07 LAB — HM DIABETES FOOT EXAM

## 2013-02-07 NOTE — Patient Instructions (Signed)
I would like you to consider trying pravastatin 40mg  daily for your cholesterol. Please call if you are willing to try this

## 2013-02-07 NOTE — Assessment & Plan Note (Signed)
Lab Results  Component Value Date   HGBA1C 6.4 02/03/2013   Good control No changes needed

## 2013-02-07 NOTE — Progress Notes (Signed)
Subjective:    Patient ID: James Braun, male    DOB: 12-Apr-1941, 72 y.o.   MRN: [id number]  HPI Here for Wellness visit and follow up No falls  No depression or anhedonia Sees eye doctor and urologist Exercises regularly  Independent in all instrumental ADLs Vision and hearing okay No memory issues  His shoulder is better Using heat Has nodule on right forearm--- feels like very small cyst or varicosity  Discussed his cholesterol He is not excited about another statin trial  Doesn't check sugars No hypoglycemic reactions Exercises regularly No neuro symptoms in extremities  No chest pain or SOB No dizziness or syncope No edema  Ureteral stone removed this year Was really tough Is on potassium citrate Voids okay  Current Outpatient Prescriptions on File Prior to Visit  Medication Sig Dispense Refill  . aspirin 81 MG tablet Take 81 mg by mouth daily.       Marland Kitchen diltiazem (CARDIZEM CD) 240 MG 24 hr capsule TAKE ONE CAPSULE BY MOUTH EVERY DAY  90 capsule  0  . finasteride (PROSCAR) 5 MG tablet Take 5 mg by mouth daily.       . metFORMIN (GLUCOPHAGE-XR) 500 MG 24 hr tablet TAKE 1 TABLET TWICE A DAY  180 tablet  2  . Multiple Vitamin (MULTIVITAMIN) tablet Take 1 tablet by mouth daily.      . potassium citrate (UROCIT-K) 10 MEQ (1080 MG) SR tablet Take 1 tablet (10 mEq total) by mouth 3 (three) times daily with meals.  270 tablet  3  . Red Yeast Rice Extract (RED YEAST RICE PO) Take 1 tablet by mouth daily.       Marland Kitchen telmisartan (MICARDIS) 80 MG tablet Take 80 mg by mouth every morning.      . triamterene-hydrochlorothiazide (MAXZIDE-25) 37.5-25 MG per tablet Take 1 tablet by mouth daily.        No current facility-administered medications on file prior to visit.    Allergies  Allergen Reactions  . Atorvastatin Other (See Comments)    REACTION: myalgias  . Morphine And Related Other (See Comments)    hypotension  . Levofloxacin Rash  . Septra (Sulfamethoxazole-Tmp Ds)  Rash    Past Medical History  Diagnosis Date  . Diabetes mellitus   . Hyperlipidemia   . Hypertension   . BPH (benign prostatic hypertrophy)   . History of melanoma excision OCT 2010  . History of kidney stones   . Nephrolithiasis BILATERAL    CURRENT RIGHT RENAL CALCULI  . Left ureteral calculus     Past Surgical History  Procedure Laterality Date  . Prostate biopsy  1998    neg  . Orif clavicle fracture  2006  . Cholecystectomy  july 2000  . Nephrolithotomy Right 09-28-2010    percutaneous  . Left ureteroscopic stone extraction  02-22-2007  . Tonsillectomy    . Extracorporeal shock wave lithotripsy  JULY 2000    X2  . Cystoscopy/retrograde/ureteroscopy/stone extraction with basket Left 11/14/2012    Procedure: CYSTOSCOPY/RETROGRADE/URETEROSCOPY/STONE EXTRACTION WITH BASKET;  Surgeon: Marcine Matar, MD;  Location: [facility];  Service: Urology;  Laterality: Left;  . Holmium laser application Left 11/14/2012    Procedure: HOLMIUM LASER APPLICATION;  Surgeon: Marcine Matar, MD;  Location: [facility];  Service: Urology;  Laterality: Left;  . Cystoscopy with stent placement Left 11/14/2012    Procedure: CYSTOSCOPY WITH STENT PLACEMENT;  Surgeon: Marcine Matar, MD;  Location: [facility];  Service: Urology;  Laterality: Left;    Family History  Problem Relation Age of Onset  . Hypertension Mother   . Diabetes Father   . Hypertension Father   . Coronary artery disease Neg Hx   . Cancer Neg Hx     History   Social History  . Marital Status: Married    Spouse Name: N/A    Number of Children: 2  . Years of Education: N/A   Occupational History  . 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 Buys very distressed houses and rehabilitates    Social History Main Topics  . Smoking status: Never Smoker   . Smokeless tobacco: Never Used  . Alcohol Use: No  . Drug Use: No  . Sexually  Active: Not on file   Other Topics Concern  . Not on file   Social History Narrative   Has living will   Wife is health care POA---alternate is committee of physicians   Would accept resuscitation---wouldn't accept prolonged ventilation   No tube feeds if cognitively unaware   Review of Systems Sleeps well Appetite is fine Weight stable Bowels fine     Objective:   Physical Exam  Constitutional: He is oriented to person, place, and time. He appears well-developed and well-nourished. No distress.  HENT:  Mouth/Throat: Oropharynx is clear and moist. No oropharyngeal exudate.  Neck: Normal range of motion. Neck supple. No thyromegaly present.  Cardiovascular: Normal rate, regular rhythm, normal heart sounds and intact distal pulses.  Exam reveals no gallop.   No murmur heard. Pulmonary/Chest: Effort normal and breath sounds normal. No respiratory distress. He has no wheezes. He has no rales.  Abdominal: Soft. There is no tenderness.  Musculoskeletal: He exhibits no edema and no tenderness.  Lymphadenopathy:    He has no cervical adenopathy.  Neurological: He is alert and oriented to person, place, and time.  President--- "Obama, Bush, Clinton" [phone] [id number] Recall 3/3  Normal fine touch sensation in feet  Skin: No rash noted. No erythema.  No foot lesions  Psychiatric: He has a normal mood and affect. His behavior is normal.          Assessment & Plan:

## 2013-02-07 NOTE — Assessment & Plan Note (Signed)
I recommended trying another statin (pravastatin) He will consider

## 2013-02-07 NOTE — Assessment & Plan Note (Signed)
I have personally reviewed the Medicare Annual Wellness questionnaire and have noted 1. The patient's medical and social history 2. Their use of alcohol, tobacco or illicit drugs 3. Their current medications and supplements 4. The patient's functional ability including ADL's, fall risks, home safety risks and hearing or visual             impairment. 5. Diet and physical activities 6. Evidence for depression or mood disorders  The patients weight, height, BMI and visual acuity have been recorded in the chart I have made referrals, counseling and provided education to the patient based review of the above and I have provided the pt with a written personalized care plan for preventive services.  I have provided you with a copy of your personalized plan for preventive services. Please take the time to review along with your updated medication list.  Due for colon cancer screening next year UTD otherwise

## 2013-02-07 NOTE — Assessment & Plan Note (Signed)
BP Readings from Last 3 Encounters:  02/07/13 140/80  01/24/13 140/82  11/14/12 130/77   Okay control Continue meds Lab Results  Component Value Date   CREATININE 1.3 02/03/2013

## 2013-02-09 ENCOUNTER — Encounter: Payer: Self-pay | Admitting: Internal Medicine

## 2013-02-20 DIAGNOSIS — L608 Other nail disorders: Secondary | ICD-10-CM | POA: Diagnosis not present

## 2013-02-20 DIAGNOSIS — B351 Tinea unguium: Secondary | ICD-10-CM | POA: Diagnosis not present

## 2013-02-20 DIAGNOSIS — B470 Eumycetoma: Secondary | ICD-10-CM | POA: Diagnosis not present

## 2013-02-20 DIAGNOSIS — M79609 Pain in unspecified limb: Secondary | ICD-10-CM | POA: Diagnosis not present

## 2013-02-22 ENCOUNTER — Other Ambulatory Visit: Payer: Self-pay | Admitting: Internal Medicine

## 2013-03-07 ENCOUNTER — Other Ambulatory Visit: Payer: Self-pay | Admitting: Internal Medicine

## 2013-04-08 DIAGNOSIS — M25519 Pain in unspecified shoulder: Secondary | ICD-10-CM | POA: Diagnosis not present

## 2013-04-09 ENCOUNTER — Other Ambulatory Visit: Payer: Self-pay | Admitting: Orthopedic Surgery

## 2013-04-09 DIAGNOSIS — M25511 Pain in right shoulder: Secondary | ICD-10-CM

## 2013-04-14 ENCOUNTER — Ambulatory Visit
Admission: RE | Admit: 2013-04-14 | Discharge: 2013-04-14 | Disposition: A | Discharge status: Home / Self Care | Payer: Medicare Other | Source: Ambulatory Visit | Attending: Orthopedic Surgery | Admitting: Orthopedic Surgery

## 2013-04-14 DIAGNOSIS — M25519 Pain in unspecified shoulder: Secondary | ICD-10-CM | POA: Diagnosis not present

## 2013-04-14 DIAGNOSIS — M25511 Pain in right shoulder: Secondary | ICD-10-CM

## 2013-04-16 DIAGNOSIS — M25519 Pain in unspecified shoulder: Secondary | ICD-10-CM | POA: Diagnosis not present

## 2013-04-23 DIAGNOSIS — N200 Calculus of kidney: Secondary | ICD-10-CM | POA: Diagnosis not present

## 2013-04-25 ENCOUNTER — Encounter: Payer: Self-pay | Admitting: Podiatrist

## 2013-04-25 ENCOUNTER — Ambulatory Visit (INDEPENDENT_AMBULATORY_CARE_PROVIDER_SITE_OTHER): Payer: Medicare Other | Admitting: Podiatrist

## 2013-04-25 VITALS — BP 124/78 | HR 88 | Resp 16

## 2013-04-25 DIAGNOSIS — B351 Tinea unguium: Secondary | ICD-10-CM | POA: Insufficient documentation

## 2013-04-25 NOTE — Progress Notes (Signed)
  Subjective:    Patient ID: James Braun, male    DOB: 1940/12/13, 72 y.o.   MRN: [id number]  HPI left toes seem to be better and on the right track and little less darkness in big toe    Review of Systems  Constitutional: Negative.   HENT: Negative.   Eyes: Negative.   Respiratory: Negative.   Cardiovascular: Negative.   Gastrointestinal: Negative.   Endocrine: Negative.   Genitourinary: Negative.   Musculoskeletal:       Lump on right forearm if it gets bumped/lc  Skin: Negative.   Allergic/Immunologic: Negative.   Hematological: Negative.   Psychiatric/Behavioral: Negative.        Objective:   Physical Exam  GENERAL APPEARANCE: Alert, conversant. Appropriately groomed. No acute distress.  VASCULAR: Pedal pulses palpable and strong bilateral.  Capillary refill time is immediate to all digits,  Proximal to distal cooling it warm to warm.  Digital hair growth is present bilateral  NEUROLOGIC: sensation is intact epicritically and protectively to 5.07 monofilament at 5/5 sites bilateral.  Light touch is intact bilateral, vibratory sensation intact bilateral, achilles tendon reflex is intact bilateral.  MUSCULOSKELETAL: acceptable muscle strength, tone and stability bilateral.  Intrinsic muscluature intact bilateral.  Rectus appearance of foot and digits noted bilateral.   DERMATOLOGIC: Hallux left second and third toenails left continue to be mildly darkened and discolored especially the left hallux. There is some clearing of the proximal nail plate noted second and third digits left improved with laser therapy is noted clinically.       Assessment & Plan:  Onychomycosis being treated with laser therapy  Plan: Laserering of Toenails left 1,2,3 was carried out at today's visit via Q-switch YAG laser by QClear Laser at continuous pulse Patient and staff were wearing appropriate laser protective goggles/eyewear Laser device was tested prior to use and safety protocols were  followed Frequency of 5 Hz, Level 4, Joules 7.5 delivered. Counter start [id number]     Counter finish  [id number]  Patient will be seen back in 2 months for continued therapy he will call if any problems arise.  Marlowe Aschoff DPM

## 2013-05-17 ENCOUNTER — Other Ambulatory Visit: Payer: Self-pay | Admitting: Internal Medicine

## 2013-05-22 ENCOUNTER — Other Ambulatory Visit: Payer: Self-pay

## 2013-05-23 ENCOUNTER — Ambulatory Visit: Payer: Self-pay | Admitting: Podiatrist

## 2013-05-23 ENCOUNTER — Ambulatory Visit (INDEPENDENT_AMBULATORY_CARE_PROVIDER_SITE_OTHER): Payer: Medicare Other | Admitting: Podiatrist

## 2013-05-23 ENCOUNTER — Encounter: Payer: Self-pay | Admitting: Podiatrist

## 2013-05-23 VITALS — BP 96/55 | HR 95 | Resp 16 | Ht 68.0 in | Wt 155.0 lb

## 2013-05-23 DIAGNOSIS — M79609 Pain in unspecified limb: Secondary | ICD-10-CM

## 2013-05-23 DIAGNOSIS — B351 Tinea unguium: Secondary | ICD-10-CM

## 2013-05-23 MED ORDER — EFINACONAZOLE 10 % EX SOLN: 1.0000 [drp] | Freq: Every day | CUTANEOUS | Status: DC

## 2013-05-23 NOTE — Progress Notes (Addendum)
Subjective:  patient presents today for followup of mycotic toenails 1,2 and 3 of the left foot. We're treating him with laser therapy he states he's noticed minimal improvement.   Objective: Mycotic appearance of nails 1, 2, 3 of the left foot is noted. Friability and thickness of the nail is noted with yellowish brownish discoloration present.  A hyperkeratotic lesion is also present submetasal 5 which is painful and symptomatic right foot  Assessment: Mycotic nails 1, 2, 3 left foot being treated with laser therapy.  Plan: Laserering of Toenails left,1,2,3 carried out at today's visit via Q-switch YAG laser by QClear Laser at continuous, Patient and staff were wearing appropriate laser protective goggles/eyewear, Laser device was tested prior to use and safety protocols were followed Frequency of 5 Hz, Level 4, Joules 7.5 delivered.  The patient tolerated the lasering well and will call if any concerns arise. I wrote a prescription for Jublia to start treating his nails.  He will be seen back in 3 months for follow up.

## 2013-05-27 DIAGNOSIS — M6740 Ganglion, unspecified site: Secondary | ICD-10-CM | POA: Diagnosis not present

## 2013-05-28 ENCOUNTER — Ambulatory Visit: Payer: Medicare Other

## 2013-05-28 ENCOUNTER — Ambulatory Visit (INDEPENDENT_AMBULATORY_CARE_PROVIDER_SITE_OTHER): Payer: Medicare Other

## 2013-05-28 DIAGNOSIS — Z23 Encounter for immunization: Secondary | ICD-10-CM

## 2013-05-29 ENCOUNTER — Telehealth: Payer: Self-pay | Admitting: *Deleted

## 2013-05-29 DIAGNOSIS — B351 Tinea unguium: Secondary | ICD-10-CM

## 2013-05-29 NOTE — Telephone Encounter (Addendum)
Pt states that a [place] pharmacy that he routinely uses will contact me for a copy of his Jublia rx.  The Jublia was prescribed 05/23/2013, electronically [facility] to [facility].  I will reprint rx, when requested by [place] pharmacy.  [facility] Com [phone] received and copy of rx faxed

## 2013-05-30 MED ORDER — EFINACONAZOLE 10 % EX SOLN: 1.0000 [drp] | Freq: Every day | CUTANEOUS | Status: DC

## 2013-06-03 ENCOUNTER — Other Ambulatory Visit: Payer: Self-pay | Admitting: *Deleted

## 2013-06-03 NOTE — Telephone Encounter (Signed)
Form on your desk asking for refills to be sen to [facility]

## 2013-06-04 MED ORDER — TELMISARTAN 80 MG PO TABS: 80.0000 mg | ORAL_TABLET | Freq: Every morning | ORAL | Status: DC

## 2013-06-04 NOTE — Telephone Encounter (Signed)
Form signed Please confirm that we can fax to this number

## 2013-06-04 NOTE — Telephone Encounter (Signed)
rx faxed to pharmacy manually [facility] fax# [phone]

## 2013-06-10 ENCOUNTER — Other Ambulatory Visit: Payer: Self-pay | Admitting: Internal Medicine

## 2013-06-18 DIAGNOSIS — D2110 Benign neoplasm of connective and other soft tissue of unspecified upper limb, including shoulder: Secondary | ICD-10-CM | POA: Diagnosis not present

## 2013-06-18 DIAGNOSIS — D367 Benign neoplasm of other specified sites: Secondary | ICD-10-CM | POA: Diagnosis not present

## 2013-06-18 DIAGNOSIS — C4910 Malignant neoplasm of connective and soft tissue of unspecified upper limb, including shoulder: Secondary | ICD-10-CM | POA: Diagnosis not present

## 2013-06-18 DIAGNOSIS — D219 Benign neoplasm of connective and other soft tissue, unspecified: Secondary | ICD-10-CM | POA: Diagnosis not present

## 2013-07-17 HISTORY — PX: SHOULDER ARTHROSCOPY: SHX128

## 2013-07-30 DIAGNOSIS — M19019 Primary osteoarthritis, unspecified shoulder: Secondary | ICD-10-CM | POA: Diagnosis not present

## 2013-07-30 DIAGNOSIS — M25819 Other specified joint disorders, unspecified shoulder: Secondary | ICD-10-CM | POA: Diagnosis not present

## 2013-07-30 DIAGNOSIS — G8918 Other acute postprocedural pain: Secondary | ICD-10-CM | POA: Diagnosis not present

## 2013-08-05 ENCOUNTER — Other Ambulatory Visit: Payer: Self-pay | Admitting: Internal Medicine

## 2013-08-05 DIAGNOSIS — Z9889 Other specified postprocedural states: Secondary | ICD-10-CM | POA: Insufficient documentation

## 2013-08-05 DIAGNOSIS — E1159 Type 2 diabetes mellitus with other circulatory complications: Secondary | ICD-10-CM

## 2013-08-06 DIAGNOSIS — M25619 Stiffness of unspecified shoulder, not elsewhere classified: Secondary | ICD-10-CM | POA: Diagnosis not present

## 2013-08-06 DIAGNOSIS — M259 Joint disorder, unspecified: Secondary | ICD-10-CM | POA: Diagnosis not present

## 2013-08-06 DIAGNOSIS — Z9889 Other specified postprocedural states: Secondary | ICD-10-CM | POA: Diagnosis not present

## 2013-08-11 ENCOUNTER — Other Ambulatory Visit (INDEPENDENT_AMBULATORY_CARE_PROVIDER_SITE_OTHER): Payer: Medicare Other

## 2013-08-11 DIAGNOSIS — E1159 Type 2 diabetes mellitus with other circulatory complications: Secondary | ICD-10-CM | POA: Diagnosis not present

## 2013-08-11 LAB — HEMOGLOBIN A1C: HEMOGLOBIN A1C: 6.4 % (ref 4.6–6.5)

## 2013-08-13 DIAGNOSIS — M25619 Stiffness of unspecified shoulder, not elsewhere classified: Secondary | ICD-10-CM | POA: Diagnosis not present

## 2013-08-13 DIAGNOSIS — M259 Joint disorder, unspecified: Secondary | ICD-10-CM | POA: Diagnosis not present

## 2013-08-13 DIAGNOSIS — Z9889 Other specified postprocedural states: Secondary | ICD-10-CM | POA: Diagnosis not present

## 2013-08-13 DIAGNOSIS — Z85820 Personal history of malignant melanoma of skin: Secondary | ICD-10-CM | POA: Diagnosis not present

## 2013-08-13 DIAGNOSIS — L820 Inflamed seborrheic keratosis: Secondary | ICD-10-CM | POA: Diagnosis not present

## 2013-08-13 DIAGNOSIS — M25519 Pain in unspecified shoulder: Secondary | ICD-10-CM | POA: Diagnosis not present

## 2013-08-13 DIAGNOSIS — L570 Actinic keratosis: Secondary | ICD-10-CM | POA: Diagnosis not present

## 2013-08-13 DIAGNOSIS — D235 Other benign neoplasm of skin of trunk: Secondary | ICD-10-CM | POA: Diagnosis not present

## 2013-08-15 ENCOUNTER — Encounter: Payer: Self-pay | Admitting: Internal Medicine

## 2013-08-15 ENCOUNTER — Ambulatory Visit (INDEPENDENT_AMBULATORY_CARE_PROVIDER_SITE_OTHER): Payer: Medicare Other | Admitting: Internal Medicine

## 2013-08-15 VITALS — BP 120/70 | HR 70 | Temp 98.1°F | Wt 155.0 lb

## 2013-08-15 DIAGNOSIS — E785 Hyperlipidemia, unspecified: Secondary | ICD-10-CM

## 2013-08-15 DIAGNOSIS — I10 Essential (primary) hypertension: Secondary | ICD-10-CM

## 2013-08-15 DIAGNOSIS — E119 Type 2 diabetes mellitus without complications: Secondary | ICD-10-CM | POA: Diagnosis not present

## 2013-08-15 NOTE — Assessment & Plan Note (Signed)
Good control Lab Results  Component Value Date   HGBA1C 6.4 08/11/2013   No changes needed

## 2013-08-15 NOTE — Progress Notes (Signed)
Subjective:    Patient ID: James Braun, male    DOB: July 01, 1941, 73 y.o.   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 aspirin 81 MG tablet Take 81 mg by mouth daily.       Marland Kitchen diltiazem (CARDIZEM CD) 240 MG 24 hr capsule TAKE ONE CAPSULE BY MOUTH EVERY DAY  90 capsule  0  . Efinaconazole 10 % SOLN Apply 1 drop topically daily.  4 mL  2  . finasteride (PROSCAR) 5 MG tablet TAKE 1 TABLET EVERY DAY  90 tablet  3  . metFORMIN (GLUCOPHAGE-XR) 500 MG 24 hr tablet TAKE 1 TABLET TWICE A DAY  180 tablet  2  . Multiple Vitamin (MULTIVITAMIN) tablet Take 1 tablet by mouth daily.      . potassium citrate (UROCIT-K) 10 MEQ (1080 MG) SR tablet TAKE 1 TABLET (10 MEQ TOTAL) BY MOUTH 3 (THREE) TIMES DAILY WITH MEALS.  270 tablet  3  . Red Yeast Rice Extract (RED YEAST RICE PO) Take 1 tablet by mouth daily.       Marland Kitchen telmisartan (MICARDIS) 80 MG tablet Take 1 tablet (80 mg total) by mouth every morning.  90 tablet  3  . triamterene-hydrochlorothiazide (MAXZIDE-25) 37.5-25 MG per tablet TAKE 1 TABLET BY MOUTH EVERY DAY  90 tablet  3   No current facility-administered medications on file prior to visit.    Allergies  Allergen Reactions  . Atorvastatin Other (See Comments)    REACTION: myalgias  . Morphine And Related Other (See Comments)    hypotension  . Levofloxacin Rash  . Septra [Sulfamethoxazole-Tmp Ds] Rash    Past Medical History  Diagnosis Date  . Diabetes mellitus   . Hyperlipidemia   . Hypertension   . BPH (benign  prostatic hypertrophy)   . History of melanoma excision OCT 2010  . History of kidney stones   . Nephrolithiasis BILATERAL    CURRENT RIGHT RENAL CALCULI  . Left ureteral calculus     Past Surgical History  Procedure Laterality Date  . Prostate biopsy  1998    neg  . Orif clavicle fracture  2006  . Cholecystectomy  july 2000  . Nephrolithotomy Right 09-28-2010    percutaneous  . Left ureteroscopic stone extraction  02-22-2007  . Tonsillectomy    . Extracorporeal shock wave lithotripsy  JULY 2000    X2  . Cystoscopy/retrograde/ureteroscopy/stone extraction with basket Left 11/14/2012    Procedure: CYSTOSCOPY/RETROGRADE/URETEROSCOPY/STONE EXTRACTION WITH BASKET;  Surgeon: [name], MD;  Location: [facility];  Service: Urology;  Laterality: Left;  . Holmium laser application Left 01/22/2955    Procedure: HOLMIUM LASER APPLICATION;  Surgeon: [name], MD;  Location: [facility];  Service: Urology;  Laterality: Left;  . Cystoscopy with stent placement Left 11/14/2012    Procedure: CYSTOSCOPY WITH STENT PLACEMENT;  Surgeon: [name], MD;  Location: [facility];  Service: Urology;  Laterality: Left;    Family History  Problem Relation  Age of Onset  . Hypertension Mother   . Diabetes Father   . Hypertension Father   . Coronary artery disease Neg Hx   . Cancer Neg Hx     History   Social History  . Marital Status: Married    Spouse Name: N/A    Number of Children: 2  . Years of Education: N/A   Occupational History  . 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 Buys very distressed houses and rehabilitates    Social History Main Topics  . Smoking status: Never Smoker   . Smokeless tobacco: Never Used  . Alcohol Use: No  . Drug Use: No  . Sexual Activity: Not on file   Other Topics Concern  . Not on file   Social History Narrative   Has living will   Wife is health  care POA---alternate is committee of physicians   Would accept resuscitation---wouldn't accept prolonged ventilation   No tube feeds if cognitively unaware   Review of Systems Had the lump removed from his arm--was a neuroma Weight is stable Sleeps well--very consistent     Objective:   Physical Exam  Constitutional: He appears well-developed and well-nourished. No distress.  Neck: Normal range of motion. Neck supple. No thyromegaly present.  Cardiovascular: Normal rate, regular rhythm, normal heart sounds and intact distal pulses.  Exam reveals no gallop.   No murmur heard. Pulmonary/Chest: Effort normal and breath sounds normal. No respiratory distress. He has no wheezes. He has no rales.  Musculoskeletal: He exhibits no edema and no tenderness.  Lymphadenopathy:    He has no cervical adenopathy.  Psychiatric: He has a normal mood and affect. His behavior is normal.          Assessment & Plan:

## 2013-08-15 NOTE — Assessment & Plan Note (Signed)
BP Readings from Last 3 Encounters:  08/15/13 120/70  05/23/13 96/55  04/25/13 124/78   Great control No changes

## 2013-08-15 NOTE — Progress Notes (Signed)
Pre-visit discussion using our clinic review tool. No additional management support is needed unless otherwise documented below in the visit note.  

## 2013-08-15 NOTE — Assessment & Plan Note (Signed)
He had such a bad reaction to atorvastatin that he will not try another Continue the red yeast rice

## 2013-08-16 ENCOUNTER — Encounter: Payer: Self-pay | Admitting: Internal Medicine

## 2013-08-19 ENCOUNTER — Telehealth: Payer: Self-pay

## 2013-08-19 NOTE — Telephone Encounter (Signed)
Relevant patient education assigned to patient using Emmi. ° °

## 2013-08-20 DIAGNOSIS — M25619 Stiffness of unspecified shoulder, not elsewhere classified: Secondary | ICD-10-CM | POA: Diagnosis not present

## 2013-08-20 DIAGNOSIS — M25519 Pain in unspecified shoulder: Secondary | ICD-10-CM | POA: Diagnosis not present

## 2013-08-20 DIAGNOSIS — M259 Joint disorder, unspecified: Secondary | ICD-10-CM | POA: Diagnosis not present

## 2013-08-20 DIAGNOSIS — Z9889 Other specified postprocedural states: Secondary | ICD-10-CM | POA: Diagnosis not present

## 2013-08-23 ENCOUNTER — Other Ambulatory Visit: Payer: Self-pay | Admitting: Internal Medicine

## 2013-08-27 ENCOUNTER — Ambulatory Visit (INDEPENDENT_AMBULATORY_CARE_PROVIDER_SITE_OTHER): Payer: Medicare Other | Admitting: Podiatrist

## 2013-08-27 ENCOUNTER — Ambulatory Visit: Payer: Medicare Other | Admitting: Podiatrist

## 2013-08-27 ENCOUNTER — Encounter: Payer: Self-pay | Admitting: Podiatrist

## 2013-08-27 VITALS — BP 121/70 | HR 80 | Resp 16

## 2013-08-27 DIAGNOSIS — M216X9 Other acquired deformities of unspecified foot: Secondary | ICD-10-CM

## 2013-08-27 DIAGNOSIS — M259 Joint disorder, unspecified: Secondary | ICD-10-CM | POA: Diagnosis not present

## 2013-08-27 DIAGNOSIS — Q828 Other specified congenital malformations of skin: Secondary | ICD-10-CM | POA: Diagnosis not present

## 2013-08-27 DIAGNOSIS — Z9889 Other specified postprocedural states: Secondary | ICD-10-CM | POA: Diagnosis not present

## 2013-08-27 DIAGNOSIS — M25619 Stiffness of unspecified shoulder, not elsewhere classified: Secondary | ICD-10-CM | POA: Diagnosis not present

## 2013-08-27 DIAGNOSIS — M25519 Pain in unspecified shoulder: Secondary | ICD-10-CM | POA: Diagnosis not present

## 2013-08-27 MED ORDER — EFINACONAZOLE 10 % EX SOLN: 1.0000 [drp] | Freq: Every day | CUTANEOUS | Status: DC

## 2013-08-27 NOTE — Patient Instructions (Signed)
Your prescription has been sent to [facility] as it is the most affordable place to fill your prescription for this particular medication.  Please give them a call at [phone] to confirm insurance benefits and to ensure correct delivery information.  If they have not heard from you, they will contact you to be sure you want them to fill the medication for you. Calling them first greatly speeds up the time it will take for you to receive your medication so it can begin working for you!    Their phone number is [phone].  Their pharmacy hours are Mon-Thurs 8-7; Fri 8-6pm.

## 2013-08-28 NOTE — Progress Notes (Signed)
Subjective: patient presents today for followup of mycotic toenails 1,2 and 3 of the left foot. We've been treating him with laser therapy he states he's noticed minimal improvement. He also relates that the [place] was very expensive to his pharmacy so he found some through [place] and he has been using it. He also has a painful callus submetatarsal 5 of the left and right foot   Objective: Mycotic appearance of nails 1, 2, 3 of the left foot is noted. Friability and thickness of the nail is noted with yellowish brownish discoloration present. A hyperkeratotic lesion is also present submetasal 5 which is painful and symptomatic right foot greater than left foot.  A porokeratotic nature of the lesion and ground glass appearance is present.    Assessment: callus submetatarsal 5 right foot, Mycotic nails 1, 2, 3 left foot being treated with laser therapy.   Plan: Callus was debrided with a 15 blade without complication.  Laserering of Toenails left,1,2,3 carried out at today's visit via Q-switch YAG laser by QClear Laser at continuous,  Patient and staff were wearing appropriate laser protective goggles/eyewear,  Laser device was tested prior to use and safety protocols were followed  Frequency of 5 Hz, Level 4, Joules 7.5 delivered.  The patient tolerated the lasering well and will call if any concerns arise. I called in a prescription at [place] to see if this is less expensive for Jublia to for his nails. He will be seen back in 3 months for follow up.

## 2013-09-03 DIAGNOSIS — M259 Joint disorder, unspecified: Secondary | ICD-10-CM | POA: Diagnosis not present

## 2013-09-03 DIAGNOSIS — Z9889 Other specified postprocedural states: Secondary | ICD-10-CM | POA: Diagnosis not present

## 2013-09-03 DIAGNOSIS — M25619 Stiffness of unspecified shoulder, not elsewhere classified: Secondary | ICD-10-CM | POA: Diagnosis not present

## 2013-09-03 DIAGNOSIS — M25519 Pain in unspecified shoulder: Secondary | ICD-10-CM | POA: Diagnosis not present

## 2013-09-24 ENCOUNTER — Ambulatory Visit (INDEPENDENT_AMBULATORY_CARE_PROVIDER_SITE_OTHER): Payer: Medicare Other | Admitting: Family Medicine

## 2013-09-24 ENCOUNTER — Encounter: Payer: Self-pay | Admitting: Family Medicine

## 2013-09-24 VITALS — BP 130/82 | HR 82 | Temp 98.2°F | Ht 67.0 in | Wt 155.5 lb

## 2013-09-24 DIAGNOSIS — R002 Palpitations: Secondary | ICD-10-CM

## 2013-09-24 NOTE — Progress Notes (Signed)
Subjective:    Patient ID: James Braun, male    DOB: 08/07/1940, 73 y.o.   MRN: [id number]  HPI Here today with palpitations   Off and on through the past week  Happens at rest when still  Feels like heartbeat is fast - but does not think he skips a beat  It tends to last - 10-15 min No chest pressure  No sob or nausea   He drinks some caffeine in am - 2 cups in the am   No more stress than usual Nothing unusual  Is retired and relatively fine mood wise   No hx of heart problems  On diltiazem for HTN and miacardis and maxzide   Had a nl stress test many years ago   Patient Active Problem List   Diagnosis Date Noted  . Palpitations 09/24/2013  . Onychomycosis 04/25/2013  . Routine general medical examination at a health care facility 01/04/2011  . BENIGN PROSTATIC HYPERTROPHY 07/13/2010  . Type II or unspecified type diabetes mellitus without mention of complication, not stated as uncontrolled 01/11/2010  . HYPERLIPIDEMIA 01/11/2010  . HYPERTENSION 01/11/2010  . NEPHROLITHIASIS, HX OF 01/11/2010   Past Medical History  Diagnosis Date  . Diabetes mellitus   . Hyperlipidemia   . Hypertension   . BPH (benign prostatic hypertrophy)   . History of melanoma excision OCT 2010  . History of kidney stones   . Nephrolithiasis BILATERAL    CURRENT RIGHT RENAL CALCULI  . Left ureteral calculus    Past Surgical History  Procedure Laterality Date  . Prostate biopsy  1998    neg  . Orif clavicle fracture  2006  . Cholecystectomy  july 2000  . Nephrolithotomy Right 09-28-2010    percutaneous  . Left ureteroscopic stone extraction  02-22-2007  . Tonsillectomy    . Extracorporeal shock wave lithotripsy  JULY 2000    X2  . Cystoscopy/retrograde/ureteroscopy/stone extraction with basket Left 11/14/2012    Procedure: CYSTOSCOPY/RETROGRADE/URETEROSCOPY/STONE EXTRACTION WITH BASKET;  Surgeon: [name], MD;  Location: [facility];  Service: Urology;   Laterality: Left;  . Holmium laser application Left 10/22/5460    Procedure: HOLMIUM LASER APPLICATION;  Surgeon: [name], MD;  Location: [facility];  Service: Urology;  Laterality: Left;  . Cystoscopy with stent placement Left 11/14/2012    Procedure: CYSTOSCOPY WITH STENT PLACEMENT;  Surgeon: [name], MD;  Location: [facility];  Service: Urology;  Laterality: Left;   History  Substance Use Topics  . Smoking status: Never Smoker   . Smokeless tobacco: Never Used  . Alcohol Use: No   Family History  Problem Relation Age of Onset  . Hypertension Mother   . Diabetes Father   . Hypertension Father   . Coronary artery disease Neg Hx   . Cancer Neg Hx    Allergies  Allergen Reactions  . Atorvastatin Other (See Comments)    REACTION: myalgias  . Morphine And Related Other (See Comments)    hypotension  . Levofloxacin Rash  . Septra [Sulfamethoxazole-Tmp Ds] Rash   Current Outpatient Prescriptions on File Prior to Visit  Medication Sig Dispense Refill  . aspirin 81 MG tablet Take 81 mg by mouth daily.       Marland Kitchen diltiazem (CARDIZEM CD) 240 MG 24 hr capsule TAKE ONE CAPSULE BY MOUTH EVERY DAY  90 capsule  0  . Efinaconazole 10 % SOLN Apply 1 drop topically daily.  4 mL  5  .  finasteride (PROSCAR) 5 MG tablet TAKE 1 TABLET EVERY DAY  90 tablet  3  . metFORMIN (GLUCOPHAGE-XR) 500 MG 24 hr tablet TAKE 1 TABLET TWICE A DAY  180 tablet  2  . Multiple Vitamin (MULTIVITAMIN) tablet Take 1 tablet by mouth daily.      . potassium citrate (UROCIT-K) 10 MEQ (1080 MG) SR tablet TAKE 1 TABLET (10 MEQ TOTAL) BY MOUTH 3 (THREE) TIMES DAILY WITH MEALS.  270 tablet  3  . Red Yeast Rice Extract (RED YEAST RICE PO) Take 1 tablet by mouth daily.       Marland Kitchen telmisartan (MICARDIS) 80 MG tablet Take 1 tablet (80 mg total) by mouth every morning.  90 tablet  3  . triamterene-hydrochlorothiazide (MAXZIDE-25) 37.5-25 MG per tablet TAKE 1 TABLET BY MOUTH EVERY DAY  90  tablet  3   No current facility-administered medications on file prior to visit.    Review of Systems Review of Systems  Constitutional: Negative for fever, appetite change, fatigue and unexpected weight change.  Eyes: Negative for pain and visual disturbance.  Respiratory: Negative for cough and shortness of breath.   Cardiovascular: Negative for cp, sob, sweating, nausea, sob on exertion,pedal edema/PND or orthopnea     Gastrointestinal: Negative for nausea, diarrhea and constipation.  Genitourinary: Negative for urgency and frequency.  Skin: Negative for pallor or rash   Neurological: Negative for weakness, light-headedness, numbness and headaches.  Hematological: Negative for adenopathy. Does not bruise/bleed easily.  Psychiatric/Behavioral: Negative for dysphoric mood. The patient is not nervous/anxious.         Objective:   Physical Exam  Constitutional: He appears well-developed and well-nourished. No distress.  HENT:  Head: Normocephalic and atraumatic.  Right Ear: External ear normal.  Left Ear: External ear normal.  Nose: Nose normal.  Mouth/Throat: Oropharynx is clear and moist.  Eyes: Conjunctivae and EOM are normal. Pupils are equal, round, and reactive to light. Right eye exhibits no discharge. Left eye exhibits no discharge. No scleral icterus.  Neck: Normal range of motion. Neck supple. No JVD present. Carotid bruit is not present. No thyromegaly present.  Cardiovascular: Normal rate, regular rhythm, normal heart sounds and intact distal pulses.  Exam reveals no gallop.   Pulmonary/Chest: Effort normal and breath sounds normal. No respiratory distress. He has no wheezes. He exhibits no tenderness.  Abdominal: Soft. Bowel sounds are normal. He exhibits no distension, no abdominal bruit and no mass. There is no tenderness.  Musculoskeletal: He exhibits no edema and no tenderness.  Lymphadenopathy:    He has no cervical adenopathy.  Neurological: He is alert. He has  normal reflexes. No cranial nerve deficit. He exhibits normal muscle tone. Coordination normal.  Skin: Skin is warm and dry. No rash noted. No erythema. No pallor.  Psychiatric: He has a normal mood and affect.          Assessment & Plan:

## 2013-09-24 NOTE — Progress Notes (Signed)
Pre visit review using our clinic review tool, if applicable. No additional management support is needed unless otherwise documented below in the visit note. 

## 2013-09-24 NOTE — Patient Instructions (Signed)
Cut your caffeine by one serving per week until none  Increase water intake  If you have more episodes of palpitations -please take your pulse (normal is 60-90 beats per minute)  If symptoms continue -let us know so we can initiate a further work up  If you develop new symptoms like chest discomfort or shortness of breath -please go to the ER

## 2013-09-25 NOTE — Assessment & Plan Note (Signed)
Intermittent/ at rest/ without other symptoms Reassuring EKG unchanged from previous  See AVS-pt will wean off caffeine entirely and also check pulse during episodes to see if it is fast  Consider holter/card eval if not imp  inst to go to ER if worse or persistent or if other symptoms

## 2013-10-08 DIAGNOSIS — Z9889 Other specified postprocedural states: Secondary | ICD-10-CM | POA: Diagnosis not present

## 2013-10-08 DIAGNOSIS — M259 Joint disorder, unspecified: Secondary | ICD-10-CM | POA: Diagnosis not present

## 2013-10-08 DIAGNOSIS — M25619 Stiffness of unspecified shoulder, not elsewhere classified: Secondary | ICD-10-CM | POA: Diagnosis not present

## 2013-11-25 ENCOUNTER — Telehealth: Payer: Self-pay | Admitting: *Deleted

## 2013-11-25 NOTE — Telephone Encounter (Signed)
Calling because [facility] filled 2 prescriptions for [employer].  They just informed me insurance company raised co-pay to $290.  They're sending a fax to get her to change the prescription.  I don't know if she wants to deal with this before my appointment on 12/04/2013.  Leave a message.

## 2013-11-26 MED ORDER — EFINACONAZOLE 10 % EX SOLN
1.0000 [drp] | Freq: Every day | CUTANEOUS | Status: DC
Start: 2013-11-26 — End: 2013-11-27

## 2013-11-26 NOTE — Telephone Encounter (Signed)
Call it in to [facility] to see if the co pay is different than [facility].-- tell him they should give him a call to let him know.... It should honor the [employer] copay card.  Ask him to call back [facility] and cancel the refill  Thanks!

## 2013-11-26 NOTE — Telephone Encounter (Signed)
I called and left the patient a message that we're going to send prescription to [facility] out of [place], [place].  [facility] may call you to get insurance information please answer when they call you.  Please call [facility] an cancel the refill per Dr. Valentina Lucks.

## 2013-11-27 ENCOUNTER — Ambulatory Visit: Payer: Medicare Other | Admitting: Podiatrist

## 2013-11-27 ENCOUNTER — Telehealth: Payer: Self-pay | Admitting: *Deleted

## 2013-11-27 MED ORDER — EFINACONAZOLE 10 % EX SOLN: 1.0000 [drp] | Freq: Every day | CUTANEOUS | Status: DC

## 2013-11-27 NOTE — Telephone Encounter (Signed)
Left message informing pt, [facility] could not authorize the [place] prescription.  I informed the pt, our office would order from [place].

## 2013-12-04 ENCOUNTER — Ambulatory Visit (INDEPENDENT_AMBULATORY_CARE_PROVIDER_SITE_OTHER): Payer: Medicare Other | Admitting: Podiatrist

## 2013-12-04 DIAGNOSIS — B351 Tinea unguium: Secondary | ICD-10-CM

## 2013-12-04 DIAGNOSIS — M79609 Pain in unspecified limb: Secondary | ICD-10-CM

## 2013-12-04 MED ORDER — EFINACONAZOLE 10 % EX SOLN: 1.0000 [drp] | Freq: Every day | CUTANEOUS | Status: DC

## 2013-12-04 NOTE — Progress Notes (Deleted)
   Subjective:    Patient ID: James Braun, male    DOB: 1940/12/11, 73 y.o.   MRN: [id number]  HPI Nail debride and callus trim   Review of Systems     Objective:   Physical Exam        Assessment & Plan:

## 2013-12-05 MED ORDER — EFINACONAZOLE 10 % EX SOLN: 1.0000 [drp] | Freq: Every day | CUTANEOUS | Status: DC

## 2013-12-06 MED ORDER — EFINACONAZOLE 10 % EX SOLN: 1.0000 [drp] | Freq: Every day | CUTANEOUS | Status: DC

## 2013-12-06 NOTE — Progress Notes (Signed)
HPI:  Patient presents today for follow up of foot and nail care. We have treated with laser therapy in the past-- he has been using the jublia for the last 3 months and sees improvement.  Today he needs a nail trim  Objective:  Patients chart is reviewed.  Vascular status reveals pedal pulses noted at 2 out of 4 dp and pt bilateral .  Neurological sensation is Normal to [employer] monofilament bilateral.  Patients nails are thickened, discolored, distrophic, friable and brittle with yellow-brown discoloration. Patient subjectively relates they are painful with shoes and with ambulation of bilateral feet.  Assessment:  Symptomatic onychomycosis  Plan:  Discussed treatment options and alternatives.  The symptomatic toenails were debrided through manual an mechanical means without complication.  Return appointment recommended at routine intervals of 3 months.  New rx of jublia called in through [facility].  He will let me know if he has trouble getting the medication.     Trudie Buckler, DPM

## 2013-12-09 ENCOUNTER — Other Ambulatory Visit: Payer: Self-pay | Admitting: Internal Medicine

## 2013-12-22 DIAGNOSIS — Z85820 Personal history of malignant melanoma of skin: Secondary | ICD-10-CM | POA: Diagnosis not present

## 2013-12-22 DIAGNOSIS — L820 Inflamed seborrheic keratosis: Secondary | ICD-10-CM | POA: Diagnosis not present

## 2014-01-19 DIAGNOSIS — E119 Type 2 diabetes mellitus without complications: Secondary | ICD-10-CM | POA: Diagnosis not present

## 2014-01-19 DIAGNOSIS — H25099 Other age-related incipient cataract, unspecified eye: Secondary | ICD-10-CM | POA: Diagnosis not present

## 2014-01-19 LAB — HM DIABETES EYE EXAM

## 2014-01-27 ENCOUNTER — Encounter: Payer: Self-pay | Admitting: Internal Medicine

## 2014-02-04 ENCOUNTER — Other Ambulatory Visit: Payer: Self-pay | Admitting: Internal Medicine

## 2014-02-04 DIAGNOSIS — E119 Type 2 diabetes mellitus without complications: Secondary | ICD-10-CM

## 2014-02-09 ENCOUNTER — Other Ambulatory Visit (INDEPENDENT_AMBULATORY_CARE_PROVIDER_SITE_OTHER): Payer: Medicare Other

## 2014-02-09 DIAGNOSIS — E119 Type 2 diabetes mellitus without complications: Secondary | ICD-10-CM

## 2014-02-09 DIAGNOSIS — Z0000 Encounter for general adult medical examination without abnormal findings: Secondary | ICD-10-CM

## 2014-02-09 DIAGNOSIS — I10 Essential (primary) hypertension: Secondary | ICD-10-CM

## 2014-02-09 DIAGNOSIS — E785 Hyperlipidemia, unspecified: Secondary | ICD-10-CM

## 2014-02-09 LAB — CBC WITH DIFFERENTIAL/PLATELET
BASOS ABS: 0 10*3/uL (ref 0.0–0.1)
Basophils Relative: 0.3 % (ref 0.0–3.0)
Eosinophils Absolute: 0.1 10*3/uL (ref 0.0–0.7)
Eosinophils Relative: 1.7 % (ref 0.0–5.0)
HCT: 45.6 % (ref 39.0–52.0)
HEMOGLOBIN: 15.3 g/dL (ref 13.0–17.0)
Lymphocytes Relative: 19 % (ref 12.0–46.0)
Lymphs Abs: 1.3 10*3/uL (ref 0.7–4.0)
MCHC: 33.4 g/dL (ref 30.0–36.0)
MCV: 90.4 fl (ref 78.0–100.0)
Monocytes Absolute: 0.4 10*3/uL (ref 0.1–1.0)
Monocytes Relative: 5.5 % (ref 3.0–12.0)
Neutro Abs: 5.2 10*3/uL (ref 1.4–7.7)
Neutrophils Relative %: 73.5 % (ref 43.0–77.0)
Platelets: 322 10*3/uL (ref 150.0–400.0)
RBC: 5.05 Mil/uL (ref 4.22–5.81)
RDW: 15.3 % (ref 11.5–15.5)
WBC: 7.1 10*3/uL (ref 4.0–10.5)

## 2014-02-09 LAB — LIPID PANEL
CHOLESTEROL: 185 mg/dL (ref 0–200)
HDL: 39.2 mg/dL (ref >39.00)
LDL Cholesterol: 109 mg/dL — ABNORMAL HIGH (ref 0–99)
NonHDL: 145.8
TRIGLYCERIDES: 184 mg/dL — AB (ref 0.0–149.0)
Total CHOL/HDL Ratio: 5
VLDL: 36.8 mg/dL (ref 0.0–40.0)

## 2014-02-09 LAB — COMPREHENSIVE METABOLIC PANEL
ALT: 24 U/L (ref 0–53)
AST: 28 U/L (ref 0–37)
Albumin: 4.2 g/dL (ref 3.5–5.2)
Alkaline Phosphatase: 54 U/L (ref 39–117)
BUN: 19 mg/dL (ref 6–23)
CO2: 28 meq/L (ref 19–32)
CREATININE: 1.3 mg/dL (ref 0.4–1.5)
Calcium: 9.4 mg/dL (ref 8.4–10.5)
Chloride: 101 mEq/L (ref 96–112)
GFR: 55.87 mL/min — AB (ref >60.00)
GLUCOSE: 138 mg/dL — AB (ref 70–99)
Potassium: 3.5 mEq/L (ref 3.5–5.1)
Sodium: 140 mEq/L (ref 135–145)
Total Bilirubin: 0.9 mg/dL (ref 0.2–1.2)
Total Protein: 7.2 g/dL (ref 6.0–8.3)

## 2014-02-09 LAB — T4, FREE: Free T4: 1.02 ng/dL (ref 0.60–1.60)

## 2014-02-09 LAB — HEMOGLOBIN A1C: Hgb A1c MFr Bld: 6.6 % — ABNORMAL HIGH (ref 4.6–6.5)

## 2014-02-13 ENCOUNTER — Ambulatory Visit (INDEPENDENT_AMBULATORY_CARE_PROVIDER_SITE_OTHER): Payer: Medicare Other | Admitting: Internal Medicine

## 2014-02-13 ENCOUNTER — Encounter: Payer: Self-pay | Admitting: Internal Medicine

## 2014-02-13 VITALS — BP 126/84 | HR 84 | Temp 98.3°F | Ht 68.0 in | Wt 155.8 lb

## 2014-02-13 DIAGNOSIS — Z0000 Encounter for general adult medical examination without abnormal findings: Secondary | ICD-10-CM | POA: Diagnosis not present

## 2014-02-13 DIAGNOSIS — E119 Type 2 diabetes mellitus without complications: Secondary | ICD-10-CM | POA: Diagnosis not present

## 2014-02-13 DIAGNOSIS — Z1211 Encounter for screening for malignant neoplasm of colon: Secondary | ICD-10-CM

## 2014-02-13 DIAGNOSIS — E785 Hyperlipidemia, unspecified: Secondary | ICD-10-CM

## 2014-02-13 DIAGNOSIS — I10 Essential (primary) hypertension: Secondary | ICD-10-CM | POA: Diagnosis not present

## 2014-02-13 DIAGNOSIS — N400 Enlarged prostate without lower urinary tract symptoms: Secondary | ICD-10-CM

## 2014-02-13 DIAGNOSIS — Z7189 Other specified counseling: Secondary | ICD-10-CM

## 2014-02-13 DIAGNOSIS — Z23 Encounter for immunization: Secondary | ICD-10-CM | POA: Diagnosis not present

## 2014-02-13 LAB — HM DIABETES FOOT EXAM

## 2014-02-13 MED ORDER — NEOMYCIN-POLYMYXIN-HC 3.5-10000-1 OT SUSP: 3.0000 [drp] | Freq: Four times a day (QID) | OTIC | Status: DC

## 2014-02-13 NOTE — Progress Notes (Signed)
Pre visit review using our clinic review tool, if applicable. No additional management support is needed unless otherwise documented below in the visit note. 

## 2014-02-13 NOTE — Assessment & Plan Note (Signed)
Voiding well on Rx

## 2014-02-13 NOTE — Assessment & Plan Note (Signed)
Close to goal Given past problems, he prefers not to try another statin

## 2014-02-13 NOTE — Assessment & Plan Note (Signed)
BP Readings from Last 3 Encounters:  02/13/14 126/84  09/24/13 130/82  08/27/13 121/70   Good control No changes needed

## 2014-02-13 NOTE — Assessment & Plan Note (Signed)
I have personally reviewed the Medicare Annual Wellness questionnaire and have noted 1. The patient's medical and social history 2. Their use of alcohol, tobacco or illicit drugs 3. Their current medications and supplements 4. The patient's functional ability including ADL's, fall risks, home safety risks and hearing or visual             impairment. 5. Diet and physical activities 6. Evidence for depression or mood disorders  The patients weight, height, BMI and visual acuity have been recorded in the chart I have made referrals, counseling and provided education to the patient based review of the above and I have provided the pt with a written personalized care plan for preventive services.  I have provided you with a copy of your personalized plan for preventive services. Please take the time to review along with your updated medication list.  Will set up colonoscopy No PSA due to age Yearly flu shot Prevnar today

## 2014-02-13 NOTE — Progress Notes (Signed)
Subjective:    Patient ID: James Braun, male    DOB: 1941-03-28, 73 y.o.   MRN: [id number]  HPI Here for Medicare wellness and follow up Reviewed his form and advanced directives Reviewed his other health care providers Vision is fine--early cataracts. Hearing is fine. No tobacco or alcohol Still works quite a bit--tries to exercise also Independent with all instrumental ADLs No cognitive changes Has shoulder arthroscopy on right 1/15. Has done well with that  Hasn't been checking sugars Did have low sugar reaction when mistakenly took 3rd metformin (in March)--no recurrences. Keeps up with eye exam No foot numbness, pain or sores  Needed to have 31 stones taken out of his right kidney 3/12 Continues on the urocit 1 other stone in 2014. Voids well Rare nocturia and no daytime problems  No chest pain No SOB No dizziness or syncope No edema  Current Outpatient Prescriptions on File Prior to Visit  Medication Sig Dispense Refill  . aspirin 81 MG tablet Take 81 mg by mouth daily.       Marland Kitchen diltiazem (CARDIZEM CD) 240 MG 24 hr capsule TAKE ONE CAPSULE BY MOUTH EVERY DAY  90 capsule  0  . Efinaconazole 10 % SOLN Apply 1 drop topically daily.  8 mL  5  . finasteride (PROSCAR) 5 MG tablet TAKE 1 TABLET EVERY DAY  90 tablet  3  . metFORMIN (GLUCOPHAGE-XR) 500 MG 24 hr tablet TAKE 1 TABLET TWICE A DAY  180 tablet  2  . Multiple Vitamin (MULTIVITAMIN) tablet Take 1 tablet by mouth daily.      . potassium citrate (UROCIT-K) 10 MEQ (1080 MG) SR tablet TAKE 1 TABLET (10 MEQ TOTAL) BY MOUTH 3 (THREE) TIMES DAILY WITH MEALS.  270 tablet  3  . Red Yeast Rice Extract (RED YEAST RICE PO) Take 1 tablet by mouth daily.       Marland Kitchen telmisartan (MICARDIS) 80 MG tablet Take 1 tablet (80 mg total) by mouth every morning.  90 tablet  3  . triamterene-hydrochlorothiazide (MAXZIDE-25) 37.5-25 MG per tablet TAKE 1 TABLET BY MOUTH EVERY DAY  90 tablet  3   No current facility-administered medications on  file prior to visit.    Allergies  Allergen Reactions  . Atorvastatin Other (See Comments)    REACTION: myalgias  . Morphine And Related Other (See Comments)     hypotension  . Levofloxacin Rash  . Septra [Sulfamethoxazole-Tmp Ds] Rash    Past Medical History  Diagnosis Date  . Diabetes mellitus   . Hyperlipidemia   . Hypertension   . BPH (benign prostatic hypertrophy)   . History of melanoma excision OCT 2010  . History of kidney stones   . Nephrolithiasis BILATERAL    CURRENT RIGHT RENAL CALCULI  . Left ureteral calculus     Past Surgical History  Procedure Laterality Date  . Prostate biopsy  1998    neg  . Orif clavicle fracture  2006  . Cholecystectomy  july 2000  . Nephrolithotomy Right 09-28-2010    percutaneous  . Left ureteroscopic stone extraction  02-22-2007  . Tonsillectomy    . Extracorporeal shock wave lithotripsy  JULY 2000    X2  . Cystoscopy/retrograde/ureteroscopy/stone extraction with basket Left 11/14/2012    Procedure: CYSTOSCOPY/RETROGRADE/URETEROSCOPY/STONE EXTRACTION WITH BASKET;  Surgeon: [name], MD;  Location: [facility];  Service: Urology;  Laterality: Left;  . Holmium laser application Left 1/0/1751    Procedure: HOLMIUM LASER APPLICATION;  Surgeon: [name],  MD;  Location: [facility];  Service: Urology;  Laterality: Left;  . Cystoscopy with stent placement Left 11/14/2012    Procedure: CYSTOSCOPY WITH STENT PLACEMENT;  Surgeon: [name], MD;  Location: [facility];  Service: Urology;  Laterality: Left;  . Shoulder arthroscopy  1/15    Dr [name]    Family History  Problem Relation Age of Onset  . Hypertension Mother   . Diabetes Father   . Hypertension Father   . Coronary artery disease Neg Hx   . Cancer Neg Hx     History   Social History  . Marital Status: Married    Spouse Name: N/A    Number of Children: 2  . Years of Education: N/A   Occupational  History  . 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 Buys very distressed houses and rehabilitates   . Does mediation    Social History Main Topics  . Smoking status: Never Smoker   . Smokeless tobacco: Never Used  . Alcohol Use: No  . Drug Use: No  . Sexual Activity: Not on file   Other Topics Concern  . Not on file   Social History Narrative   Has living will   Wife is health care POA---alternate is committee of physicians   Would accept resuscitation---wouldn't accept prolonged ventilation   No tube feeds if cognitively unaware   Review of Systems Has started jublia for his toenails---he feels it is helping Sleeps well Appetite is fine---weight stable Bowels have been fine Gets intermittent ear itching--uses Rx drops with good success     Objective:   Physical Exam  Constitutional: He is oriented to person, place, and time. He appears well-developed and well-nourished. No distress.  HENT:  Mouth/Throat: Oropharynx is clear and moist. No oropharyngeal exudate.  Neck: Normal range of motion. Neck supple. No thyromegaly present.  Cardiovascular: Normal rate, regular rhythm, normal heart sounds and intact distal pulses.  Exam reveals no gallop.   No murmur heard. Pulmonary/Chest: Effort normal and breath sounds normal. No respiratory distress. He has no wheezes. He has no rales.  Abdominal: Soft. There is no tenderness.  Musculoskeletal: He exhibits no edema and no tenderness.  Lymphadenopathy:    He has no cervical adenopathy.  Neurological: He is alert and oriented to person, place, and time.  President -- "Obama, Bush, Clinton" [phone] [id number] Recall 2/3  Normal sensation on plantar feet  Skin: No rash noted. No erythema.  No foot lesions  Psychiatric: He has a normal mood and affect. His behavior is normal.          Assessment & Plan:

## 2014-02-13 NOTE — Assessment & Plan Note (Signed)
See social history 

## 2014-02-13 NOTE — Assessment & Plan Note (Signed)
Lab Results  Component Value Date   HGBA1C 6.6* 02/09/2014   Still with excellent control

## 2014-02-16 ENCOUNTER — Telehealth: Payer: Self-pay | Admitting: Internal Medicine

## 2014-02-16 NOTE — Telephone Encounter (Signed)
Relevant patient education assigned to patient using Emmi. ° °

## 2014-02-21 ENCOUNTER — Other Ambulatory Visit: Payer: Self-pay | Admitting: Internal Medicine

## 2014-02-22 ENCOUNTER — Other Ambulatory Visit: Payer: Self-pay | Admitting: Internal Medicine

## 2014-02-23 DIAGNOSIS — Z85820 Personal history of malignant melanoma of skin: Secondary | ICD-10-CM | POA: Diagnosis not present

## 2014-02-23 DIAGNOSIS — L820 Inflamed seborrheic keratosis: Secondary | ICD-10-CM | POA: Diagnosis not present

## 2014-02-23 DIAGNOSIS — L570 Actinic keratosis: Secondary | ICD-10-CM | POA: Diagnosis not present

## 2014-02-23 DIAGNOSIS — D235 Other benign neoplasm of skin of trunk: Secondary | ICD-10-CM | POA: Diagnosis not present

## 2014-03-05 ENCOUNTER — Ambulatory Visit (INDEPENDENT_AMBULATORY_CARE_PROVIDER_SITE_OTHER): Payer: Medicare Other | Admitting: Podiatrist

## 2014-03-05 VITALS — BP 124/71 | HR 80 | Resp 16

## 2014-03-05 DIAGNOSIS — B351 Tinea unguium: Secondary | ICD-10-CM

## 2014-03-05 DIAGNOSIS — M79675 Pain in left toe(s): Secondary | ICD-10-CM

## 2014-03-05 DIAGNOSIS — M79609 Pain in unspecified limb: Secondary | ICD-10-CM | POA: Diagnosis not present

## 2014-03-05 DIAGNOSIS — E119 Type 2 diabetes mellitus without complications: Secondary | ICD-10-CM

## 2014-03-05 MED ORDER — EFINACONAZOLE 10 % EX SOLN: 1.0000 [drp] | Freq: Every day | CUTANEOUS | Status: DC

## 2014-03-09 NOTE — Progress Notes (Signed)
HPI: Patient presents today for follow up of foot and nail care. We have treated with laser therapy in the past-- he has been using the jublia for the last 3 months and sees improvement. Today he needs a nail trim  Objective: Patients chart is reviewed. Vascular status reveals pedal pulses noted at 2 out of 4 dp and pt bilateral . Neurological sensation is Normal to [employer] monofilament bilateral. Patients nails are thickened, discolored, distrophic, friable and brittle with yellow-brown discoloration. Patient subjectively relates they are painful with shoes and with ambulation of bilateral feet.  Assessment: Symptomatic onychomycosis  Plan: Discussed treatment options and alternatives. The symptomatic toenails were debrided through manual an mechanical means without complication. Return appointment recommended at routine intervals of 3 months. New rx of jublia called in through [facility]. He will let me know if he has trouble getting the medication.

## 2014-03-11 ENCOUNTER — Telehealth: Payer: Self-pay | Admitting: *Deleted

## 2014-03-11 NOTE — Telephone Encounter (Signed)
Yes, [facility] is the other place-- I think he said it is $70 and from what I know that's the cheapest he will be able to get it-- the copay cards won't work for medicare so I'm assuming that's why it was so costly at [facility]

## 2014-03-11 NOTE — Telephone Encounter (Signed)
[facility] at [facility] still not covered, need alternative place or company to get medicine.  I returned his call.  I asked how I could help him.  He stated that Dr. Valentina Lucks had told him to let her know if the Jublia was not covered by the insurance.  He said the cost was going to be $600 plus dollars.  He stated, Dr. Valentina Lucks said she knew of a place to get it cheaper.  She said she had some zero co-pay cards that may work.  I told him we had sent a prescription to [facility] before and you still have refills.  He asked, Is that the same place she was going to send it to?  I told him maybe so.  He said if that's the case I will go ahead and get it from them and pay the double co-pay.  I told him that is fine.

## 2014-03-20 ENCOUNTER — Ambulatory Visit (INDEPENDENT_AMBULATORY_CARE_PROVIDER_SITE_OTHER): Payer: Medicare Other

## 2014-03-20 ENCOUNTER — Ambulatory Visit (INDEPENDENT_AMBULATORY_CARE_PROVIDER_SITE_OTHER): Payer: Medicare Other | Admitting: Podiatrist

## 2014-03-20 ENCOUNTER — Encounter: Payer: Self-pay | Admitting: Podiatrist

## 2014-03-20 VITALS — BP 144/78 | HR 83 | Resp 14 | Ht 66.0 in | Wt 155.0 lb

## 2014-03-20 DIAGNOSIS — M722 Plantar fascial fibromatosis: Secondary | ICD-10-CM | POA: Diagnosis not present

## 2014-03-20 NOTE — Progress Notes (Signed)
   Subjective:    Patient ID: James Braun, male    DOB: 02-08-1941, 73 y.o.   MRN: [id number]  HPI Comments: Pt complains of left heel bruised sensation for about 4 weeks.  Pt states he has to wear shoes all of the time can not go barefooted.  Pt states he walks mostly on the forefoot and wears athletic shoes.     Review of Systems  All other systems reviewed and are negative.      Objective:   Physical Exam Neurovascular status is intact and unchanged. Discrete pain on palpation plantar medial aspect of the left heel is noted no pain with medial to lateral squeeze test is noted. X-rays show no sign of fracture, spurring or dislocation. Findings consistent with plantar fasciitis noted.    Assessment & Plan:  Plantar fasciitis left  Plan: Injected the left heel with Kenalog and Marcaine mixture. Recommended continued use of good supportive shoes and stretching exercises. He will followup as needed this should be self-limiting as his only had it for a couple of months

## 2014-03-20 NOTE — Patient Instructions (Signed)
Plantar Fasciitis Plantar fasciitis is a common condition that causes foot pain. It is soreness (inflammation) of the band of tough fibrous tissue on the bottom of the foot that runs from the heel bone (calcaneus) to the ball of the foot. The cause of this soreness may be from excessive standing, poor fitting shoes, running on hard surfaces, being overweight, having an abnormal walk, or overuse (this is common in runners) of the painful foot or feet. It is also common in aerobic exercise dancers and ballet dancers. SYMPTOMS  Most people with plantar fasciitis complain of:  Severe pain in the morning on the bottom of their foot especially when taking the first steps out of bed. This pain recedes after a few minutes of walking.  Severe pain is experienced also during walking following a long period of inactivity.  Pain is worse when walking barefoot or up stairs DIAGNOSIS   Your caregiver will diagnose this condition by examining and feeling your foot.  Special tests such as X-rays of your foot, are usually not needed. PREVENTION   Consult a sports medicine professional before beginning a new exercise program.  Walking programs offer a good workout. With walking there is a lower chance of overuse injuries common to runners. There is less impact and less jarring of the joints.  Begin all new exercise programs slowly. If problems or pain develop, decrease the amount of time or distance until you are at a comfortable level.  Wear good shoes and replace them regularly.  Stretch your foot and the heel cords at the back of the ankle (Achilles tendon) both before and after exercise.  Run or exercise on even surfaces that are not hard. For example, asphalt is better than pavement.  Do not run barefoot on hard surfaces.  If using a treadmill, vary the incline.  Do not continue to workout if you have foot or joint problems. Seek professional help if they do not improve. HOME CARE INSTRUCTIONS     Avoid activities that cause you pain until you recover.  Use ice or cold packs on the problem or painful areas after working out.  Only take over-the-counter or prescription medicines for pain, discomfort, or fever as directed by your caregiver.  Soft shoe inserts or athletic shoes with air or gel sole cushions may be helpful.  If problems continue or become more severe, consult a sports medicine caregiver or your own health care provider. Cortisone is a potent anti-inflammatory medication that may be injected into the painful area. You can discuss this treatment with your caregiver. MAKE SURE YOU:   Understand these instructions.  Will watch your condition.  Will get help right away if you are not doing well or get worse. Document Released: 03/28/2001 Document Revised: 09/25/2011 Document Reviewed: 05/27/2008 Exit[employer] Patient Information 2015 [employer]. This information is not intended to replace advice given to you by your health care provider. Make sure you discuss any questions you have with your health care provider.   Plantar Fasciitis (Heel Spur Syndrome) with Rehab The plantar fascia is a fibrous, ligament-like, soft-tissue structure that spans the bottom of the foot. Plantar fasciitis is a condition that causes pain in the foot due to inflammation of the tissue. SYMPTOMS   Pain and tenderness on the underneath side of the foot.  Pain that worsens with standing or walking. CAUSES  Plantar fasciitis is caused by irritation and injury to the plantar fascia on the underneath side of the foot. Common mechanisms of injury include:    Direct trauma to bottom of the foot.  Damage to a small nerve that runs under the foot where the main fascia attaches to the heel bone. Stress placed on the plantar fascia due to any mild increased activity or injury RISK INCREASES WITH:   Obesity.  Poor strength and flexibility.  Improperly fitted shoes.  Tight calf muscles.  Flat  feet.  Failure to warm-up properly before activity.  PREVENTION  Warm up and stretch properly before activity.  Strength, flexibility  Maintain a health body weight.  Avoid stress on the plantar fascia.  Wear properly fitted shoes, including arch supports for individuals who have flat feet. PROGNOSIS  If treated properly, then the symptoms of plantar fasciitis usually resolve without surgery. However, occasionally surgery is necessary. RELATED COMPLICATIONS   Recurrent symptoms that may result in a chronic condition.  Problems of the lower back that are caused by compensating for the injury, such as limping.  Pain or weakness of the foot during push-off following surgery.  Chronic inflammation, scarring, and partial or complete fascia tear, occurring more often from repeated injections. TREATMENT  Treatment initially involves the use of ice and medication to help reduce pain and inflammation. The use of strengthening and stretching exercises may help reduce pain with activity, especially stretches of the Achilles tendon.  Your caregiver may recommend that you use arch supports to help reduce stress on the plantar fascia. Often, corticosteroid injections are given to reduce inflammation. If symptoms persist for greater than 6 months despite non-surgical (conservative), then surgery may be recommended.  MEDICATION   If pain medication is necessary, then nonsteroidal anti-inflammatory medications, such as aspirin and ibuprofen, or other minor pain relievers, such as acetaminophen, are often recommended. Corticosteroid injections may be given by your caregiver.  HEAT AND COLD  Cold treatment (icing) relieves pain and reduces inflammation. Cold treatment should be applied for 10 to 15 minutes every 2 to 3 hours for inflammation and pain and immediately after any activity that aggravates your symptoms. Use ice packs or massage the area with a piece of ice (ice massage).  Heat treatment  may be used prior to performing the stretching and strengthening activities prescribed by your caregiver, physical therapist, or athletic trainer. Use a heat pack or soak the injury in warm water. SEEK IMMEDIATE MEDICAL CARE IF:  Treatment seems to offer no benefit, or the condition worsens.  Any medications produce adverse side effects.    EXERCISES-- perform each exercise a total of 10-15 repetitions.  Hold for 30 seconds and perform 3 times per day   RANGE OF MOTION (ROM) AND STRETCHING EXERCISES - Plantar Fasciitis (Heel Spur Syndrome) These exercises may help you when beginning to rehabilitate your injury.   While completing these exercises, remember:   Restoring tissue flexibility helps normal motion to return to the joints. This allows healthier, less painful movement and activity.  An effective stretch should be held for at least 30 seconds.  A stretch should never be painful. You should only feel a gentle lengthening or release in the stretched tissue. RANGE OF MOTION - Toe Extension, Flexion  Sit with your right / left leg crossed over your opposite knee.  Grasp your toes and gently pull them back toward the top of your foot. You should feel a stretch on the bottom of your toes and/or foot.  Hold this stretch for __________ seconds.  Now, gently pull your toes toward the bottom of your foot. You should feel a stretch on the top  of your toes and or foot.  Hold this stretch for __________ seconds. Repeat __________ times. Complete this stretch __________ times per day.  RANGE OF MOTION - Ankle Dorsiflexion, Active Assisted  Remove shoes and sit on a chair that is preferably not on a carpeted surface.  Place right / left foot under knee. Extend your opposite leg for support.  Keeping your heel down, slide your right / left foot back toward the chair until you feel a stretch at your ankle or calf. If you do not feel a stretch, slide your bottom forward to the edge of the  chair, while still keeping your heel down.  Hold this stretch for __________ seconds. Repeat __________ times. Complete this stretch __________ times per day.  STRETCH  Gastroc, Standing  Place hands on wall.  Extend right / left leg, keeping the front knee somewhat bent.  Slightly point your toes inward on your back foot.  Keeping your right / left heel on the floor and your knee straight, shift your weight toward the wall, not allowing your back to arch.  You should feel a gentle stretch in the right / left calf. Hold this position for __________ seconds. Repeat __________ times. Complete this stretch __________ times per day. STRETCH  Soleus, Standing  Place hands on wall.  Extend right / left leg, keeping the other knee somewhat bent.  Slightly point your toes inward on your back foot.  Keep your right / left heel on the floor, bend your back knee, and slightly shift your weight over the back leg so that you feel a gentle stretch deep in your back calf.  Hold this position for __________ seconds. Repeat __________ times. Complete this stretch __________ times per day. STRETCH  Gastrocsoleus, Standing  Note: This exercise can place a lot of stress on your foot and ankle. Please complete this exercise only if specifically instructed by your caregiver.   Place the ball of your right / left foot on a step, keeping your other foot firmly on the same step.  Hold on to the wall or a rail for balance.  Slowly lift your other foot, allowing your body weight to press your heel down over the edge of the step.  You should feel a stretch in your right / left calf.  Hold this position for __________ seconds.  Repeat this exercise with a slight bend in your right / left knee. Repeat __________ times. Complete this stretch __________ times per day.  STRENGTHENING EXERCISES - Plantar Fasciitis (Heel Spur Syndrome)  These exercises may help you when beginning to rehabilitate your  injury. They may resolve your symptoms with or without further involvement from your physician, physical therapist or athletic trainer. While completing these exercises, remember:   Muscles can gain both the endurance and the strength needed for everyday activities through controlled exercises.  Complete these exercises as instructed by your physician, physical therapist or athletic trainer. Progress the resistance and repetitions only as guided.

## 2014-03-21 ENCOUNTER — Other Ambulatory Visit: Payer: Self-pay | Admitting: Internal Medicine

## 2014-04-08 ENCOUNTER — Other Ambulatory Visit: Payer: Self-pay | Admitting: Gastroenterology

## 2014-04-08 DIAGNOSIS — Z1211 Encounter for screening for malignant neoplasm of colon: Secondary | ICD-10-CM | POA: Diagnosis not present

## 2014-04-08 DIAGNOSIS — D126 Benign neoplasm of colon, unspecified: Secondary | ICD-10-CM | POA: Diagnosis not present

## 2014-04-08 DIAGNOSIS — K648 Other hemorrhoids: Secondary | ICD-10-CM | POA: Diagnosis not present

## 2014-04-08 LAB — HM COLONOSCOPY

## 2014-04-29 DIAGNOSIS — N402 Nodular prostate without lower urinary tract symptoms: Secondary | ICD-10-CM | POA: Diagnosis not present

## 2014-04-29 DIAGNOSIS — N200 Calculus of kidney: Secondary | ICD-10-CM | POA: Diagnosis not present

## 2014-05-07 ENCOUNTER — Ambulatory Visit (INDEPENDENT_AMBULATORY_CARE_PROVIDER_SITE_OTHER): Payer: Medicare Other | Admitting: Podiatrist

## 2014-05-07 ENCOUNTER — Encounter: Payer: Self-pay | Admitting: Podiatrist

## 2014-05-07 DIAGNOSIS — B351 Tinea unguium: Secondary | ICD-10-CM

## 2014-05-07 DIAGNOSIS — Q828 Other specified congenital malformations of skin: Secondary | ICD-10-CM

## 2014-05-07 DIAGNOSIS — M79609 Pain in unspecified limb: Principal | ICD-10-CM

## 2014-05-07 DIAGNOSIS — E119 Type 2 diabetes mellitus without complications: Secondary | ICD-10-CM

## 2014-05-07 NOTE — Patient Instructions (Signed)
Diabetes and Foot Care Diabetes may cause you to have problems because of poor blood supply (circulation) to your feet and legs. This may cause the skin on your feet to become thinner, break easier, and heal more slowly. Your skin may become dry, and the skin may peel and crack. You may also have nerve damage in your legs and feet causing decreased feeling in them. You may not notice minor injuries to your feet that could lead to infections or more serious problems. Taking care of your feet is one of the most important things you can do for yourself.  HOME CARE INSTRUCTIONS  Wear shoes at all times, even in the house. Do not go barefoot. Bare feet are easily injured.  Check your feet daily for blisters, cuts, and redness. If you cannot see the bottom of your feet, use a mirror or ask someone for help.  Wash your feet with warm water (do not use hot water) and mild soap. Then pat your feet and the areas between your toes until they are completely dry. Do not soak your feet as this can dry your skin.  Apply a moisturizing lotion or petroleum jelly (that does not contain alcohol and is unscented) to the skin on your feet and to dry, brittle toenails. Do not apply lotion between your toes.  Trim your toenails straight across. Do not dig under them or around the cuticle. File the edges of your nails with an emery board or nail file.  Do not cut corns or calluses or try to remove them with medicine.  Wear clean socks or stockings every day. Make sure they are not too tight. Do not wear knee-high stockings since they may decrease blood flow to your legs.  Wear shoes that fit properly and have enough cushioning. To break in new shoes, wear them for just a few hours a day. This prevents you from injuring your feet. Always look in your shoes before you put them on to be sure there are no objects inside.  Do not cross your legs. This may decrease the blood flow to your feet.  If you find a minor scrape,  cut, or break in the skin on your feet, keep it and the skin around it clean and dry. These areas may be cleansed with mild soap and water. Do not cleanse the area with peroxide, alcohol, or iodine.  When you remove an adhesive bandage, be sure not to damage the skin around it.  If you have a wound, look at it several times a day to make sure it is healing.  Do not use heating pads or hot water bottles. They may burn your skin. If you have lost feeling in your feet or legs, you may not know it is happening until it is too late.  Make sure your health care provider performs a complete foot exam at least annually or more often if you have foot problems. Report any cuts, sores, or bruises to your health care provider immediately. SEEK MEDICAL CARE IF:   You have an injury that is not healing.  You have cuts or breaks in the skin.  You have an ingrown nail.  You notice redness on your legs or feet.  You feel burning or tingling in your legs or feet.  You have pain or cramps in your legs and feet.  Your legs or feet are numb.  Your feet always feel cold. SEEK IMMEDIATE MEDICAL CARE IF:   There is increasing redness,   swelling, or pain in or around a wound.  There is a red line that goes up your leg.  Pus is coming from a wound.  You develop a fever or as directed by your health care provider.  You notice a bad smell coming from an ulcer or wound. Document Released: 06/30/2000 Document Revised: 03/05/2013 Document Reviewed: 12/10/2012 Exit[employer] Patient Information 2015 [employer]. This information is not intended to replace advice given to you by your health care provider. Make sure you discuss any questions you have with your health care provider.  

## 2014-05-07 NOTE — Progress Notes (Signed)
HPI: Patient presents today for follow up of foot and nail care. We have treated with laser therapy in the past-- he has been using the jublia for the last 3 months and sees improvement. Today he needs a nail trim and has painful calluses of bilateral feet.  He is diabetic and has decreased sensation   Objective: Patients chart is reviewed. Vascular status reveals pedal pulses noted at 2 out of 4 dp and pt bilateral . Neurological sensation is decreased to [employer] monofilament bilateral at 2/5 sites. Light touch and vibration are also decreased.. Patients nails are thickened, discolored, distrophic, friable and brittle with yellow-brown discoloration. Patient subjectively relates they are painful with shoes and with ambulation of bilateral feet.   Assessment: Symptomatic onychomycosis , diabetes with neuropathy, porokeratosis  Plan: Discussed treatment options and alternatives. The symptomatic toenails and lesions were debrided through manual an mechanical means without complication. Return appointment recommended at routine intervals of 3 months.  

## 2014-05-23 ENCOUNTER — Other Ambulatory Visit: Payer: Self-pay | Admitting: Internal Medicine

## 2014-05-26 ENCOUNTER — Ambulatory Visit (INDEPENDENT_AMBULATORY_CARE_PROVIDER_SITE_OTHER): Payer: Medicare Other

## 2014-05-26 DIAGNOSIS — Z23 Encounter for immunization: Secondary | ICD-10-CM

## 2014-06-02 ENCOUNTER — Other Ambulatory Visit: Payer: Self-pay | Admitting: Internal Medicine

## 2014-06-15 ENCOUNTER — Telehealth: Payer: Self-pay | Admitting: *Deleted

## 2014-06-15 MED ORDER — EFINACONAZOLE 10 % EX SOLN: 1.0000 [drp] | Freq: Every day | CUTANEOUS | Status: DC

## 2014-06-15 NOTE — Telephone Encounter (Signed)
I called and informed the patient that I sent the prescription to [facility].  You should be able to get it for $35, if not, call and let us know.  "I will call you back if I can't get it for $35?  Your name is?"  Yes sir that is correct, my name is [name].  "Okay thank you."

## 2014-06-15 NOTE — Telephone Encounter (Signed)
Pt states [facility] will not refill the Jublia.

## 2014-06-16 ENCOUNTER — Telehealth: Payer: Self-pay | Admitting: *Deleted

## 2014-06-16 NOTE — Telephone Encounter (Signed)
I called the patient and informed him that we sent the prescription in on yesterday at 5:16pm.  "I went by there on yesterday and they told me it wasn't there.  I went again today to pick it up and was told it's still not there."  I told him I would call and see what the problem is and call him back.  I called the pharmacy.  Truddie Crumble said he has not received the prescription.  He asked if I had a receipt.  I told him yes.  He stated, "Maybe it got lost in cyber space.  Do you want to try it again or just give it to me now?"  I gave him a verbal for Jublia.  "We'll take care of it."  I called the patient back and informed him the prescription was called in.  Not sure what may have happened.  "I just talked to [facility], he said it was going to cost me $1300 out of pocket.  I'm not going to pay that.  Is there any other alternatives?"  I told him I would ask Dr. Valentina Lucks and see what she suggests.  ([facility] would no longer fill it.)

## 2014-06-16 NOTE — Telephone Encounter (Signed)
Pt states the [facility] did not receive the refill request for Jublia.

## 2014-06-17 NOTE — Telephone Encounter (Signed)
Can you call Yvone Neu and see if [facility] is still doing the $35 medicare for jublia-- I thought he said they would fill for that price through January.    Thanks!

## 2014-06-23 ENCOUNTER — Telehealth: Payer: Self-pay | Admitting: *Deleted

## 2014-06-23 DIAGNOSIS — M67441 Ganglion, right hand: Secondary | ICD-10-CM | POA: Diagnosis not present

## 2014-06-23 NOTE — Telephone Encounter (Signed)
Pt asked the status of his Jublia prescription.

## 2014-06-24 NOTE — Telephone Encounter (Signed)
I called and left a message for James Braun to call me back in regards to denial of refills for Jublia from [facility].  What happened to the $35 charge?

## 2014-06-26 NOTE — Telephone Encounter (Signed)
I called the patient to inform him that James Braun is not covered by Medicare and [facility] is no longer doing business with [facility].  "Thank you for the call.  I'm wondering if she can write me a prescription for the Jublia 6 ml.  I can send it to [facility] and get it filled for $139.  This will be the last time that I get it filled because it will be a year and it hasn't gone away yet."  I told him I would have to ask Dr. Valentina Lucks.  "You can put it in the mail or I can swing by there to pick it up.  Better yet, you can scan the prescription and email it to me."  I told him we are not able to send the prescription via email.  I'll call you back with a response.  "Okay, thank you very much."

## 2014-07-08 ENCOUNTER — Encounter: Payer: Self-pay | Admitting: Podiatrist

## 2014-07-08 ENCOUNTER — Ambulatory Visit (INDEPENDENT_AMBULATORY_CARE_PROVIDER_SITE_OTHER): Payer: Medicare Other | Admitting: Podiatrist

## 2014-07-08 ENCOUNTER — Other Ambulatory Visit: Payer: Self-pay | Admitting: Orthopedic Surgery

## 2014-07-08 DIAGNOSIS — E119 Type 2 diabetes mellitus without complications: Secondary | ICD-10-CM | POA: Diagnosis not present

## 2014-07-08 DIAGNOSIS — Q828 Other specified congenital malformations of skin: Secondary | ICD-10-CM

## 2014-07-08 DIAGNOSIS — M79609 Pain in unspecified limb: Principal | ICD-10-CM

## 2014-07-08 DIAGNOSIS — M67441 Ganglion, right hand: Secondary | ICD-10-CM | POA: Diagnosis not present

## 2014-07-08 DIAGNOSIS — M71341 Other bursal cyst, right hand: Secondary | ICD-10-CM | POA: Diagnosis not present

## 2014-07-08 DIAGNOSIS — B351 Tinea unguium: Secondary | ICD-10-CM

## 2014-07-08 DIAGNOSIS — E1140 Type 2 diabetes mellitus with diabetic neuropathy, unspecified: Secondary | ICD-10-CM

## 2014-07-08 NOTE — Patient Instructions (Signed)
Diabetes and Foot Care Diabetes may cause you to have problems because of poor blood supply (circulation) to your feet and legs. This may cause the skin on your feet to become thinner, break easier, and heal more slowly. Your skin may become dry, and the skin may peel and crack. You may also have nerve damage in your legs and feet causing decreased feeling in them. You may not notice minor injuries to your feet that could lead to infections or more serious problems. Taking care of your feet is one of the most important things you can do for yourself.  HOME CARE INSTRUCTIONS  Wear shoes at all times, even in the house. Do not go barefoot. Bare feet are easily injured.  Check your feet daily for blisters, cuts, and redness. If you cannot see the bottom of your feet, use a mirror or ask someone for help.  Wash your feet with warm water (do not use hot water) and mild soap. Then pat your feet and the areas between your toes until they are completely dry. Do not soak your feet as this can dry your skin.  Apply a moisturizing lotion or petroleum jelly (that does not contain alcohol and is unscented) to the skin on your feet and to dry, brittle toenails. Do not apply lotion between your toes.  Trim your toenails straight across. Do not dig under them or around the cuticle. File the edges of your nails with an emery board or nail file.  Do not cut corns or calluses or try to remove them with medicine.  Wear clean socks or stockings every day. Make sure they are not too tight. Do not wear knee-high stockings since they may decrease blood flow to your legs.  Wear shoes that fit properly and have enough cushioning. To break in new shoes, wear them for just a few hours a day. This prevents you from injuring your feet. Always look in your shoes before you put them on to be sure there are no objects inside.  Do not cross your legs. This may decrease the blood flow to your feet.  If you find a minor scrape,  cut, or break in the skin on your feet, keep it and the skin around it clean and dry. These areas may be cleansed with mild soap and water. Do not cleanse the area with peroxide, alcohol, or iodine.  When you remove an adhesive bandage, be sure not to damage the skin around it.  If you have a wound, look at it several times a day to make sure it is healing.  Do not use heating pads or hot water bottles. They may burn your skin. If you have lost feeling in your feet or legs, you may not know it is happening until it is too late.  Make sure your health care provider performs a complete foot exam at least annually or more often if you have foot problems. Report any cuts, sores, or bruises to your health care provider immediately. SEEK MEDICAL CARE IF:   You have an injury that is not healing.  You have cuts or breaks in the skin.  You have an ingrown nail.  You notice redness on your legs or feet.  You feel burning or tingling in your legs or feet.  You have pain or cramps in your legs and feet.  Your legs or feet are numb.  Your feet always feel cold. SEEK IMMEDIATE MEDICAL CARE IF:   There is increasing redness,   swelling, or pain in or around a wound.  There is a red line that goes up your leg.  Pus is coming from a wound.  You develop a fever or as directed by your health care provider.  You notice a bad smell coming from an ulcer or wound. Document Released: 06/30/2000 Document Revised: 03/05/2013 Document Reviewed: 12/10/2012 Exit[employer] Patient Information 2015 [employer]. This information is not intended to replace advice given to you by your health care provider. Make sure you discuss any questions you have with your health care provider.  

## 2014-07-08 NOTE — Progress Notes (Signed)
HPI: Patient presents today for follow up of foot and nail care. We have treated with laser therapy in the past-- he has been using the jublia for the last 3 months and sees improvement. Today he needs a nail trim and has painful calluses of bilateral feet.  He is diabetic and has decreased sensation   Objective: Patients chart is reviewed. Vascular status reveals pedal pulses noted at 2 out of 4 dp and pt bilateral . Neurological sensation is decreased to [employer] monofilament bilateral at 2/5 sites. Light touch and vibration are also decreased.. Patients nails are thickened, discolored, distrophic, friable and brittle with yellow-brown discoloration. Patient subjectively relates they are painful with shoes and with ambulation of bilateral feet.   Assessment: Symptomatic onychomycosis , diabetes with neuropathy, porokeratosis  Plan: Discussed treatment options and alternatives. The symptomatic toenails and lesions were debrided through manual an mechanical means without complication. Return appointment recommended at routine intervals of 3 months.

## 2014-07-21 DIAGNOSIS — D225 Melanocytic nevi of trunk: Secondary | ICD-10-CM | POA: Diagnosis not present

## 2014-07-21 DIAGNOSIS — L820 Inflamed seborrheic keratosis: Secondary | ICD-10-CM | POA: Diagnosis not present

## 2014-07-21 DIAGNOSIS — L818 Other specified disorders of pigmentation: Secondary | ICD-10-CM | POA: Diagnosis not present

## 2014-07-21 DIAGNOSIS — Z85820 Personal history of malignant melanoma of skin: Secondary | ICD-10-CM | POA: Diagnosis not present

## 2014-07-21 DIAGNOSIS — Z08 Encounter for follow-up examination after completed treatment for malignant neoplasm: Secondary | ICD-10-CM | POA: Diagnosis not present

## 2014-07-21 DIAGNOSIS — L814 Other melanin hyperpigmentation: Secondary | ICD-10-CM | POA: Diagnosis not present

## 2014-08-03 ENCOUNTER — Other Ambulatory Visit: Payer: Self-pay | Admitting: *Deleted

## 2014-08-03 MED ORDER — TELMISARTAN 80 MG PO TABS: 80.0000 mg | ORAL_TABLET | Freq: Every morning | ORAL | Status: DC

## 2014-08-03 NOTE — Telephone Encounter (Signed)
Last prescribed 05/2013. Rx was send from [place] [facility]. Ok to print?

## 2014-08-03 NOTE — Telephone Encounter (Signed)
Approved:okay to print for a year

## 2014-08-03 NOTE — Telephone Encounter (Signed)
Rx faxed manually to [email or web address] at [phone].

## 2014-08-10 ENCOUNTER — Other Ambulatory Visit (INDEPENDENT_AMBULATORY_CARE_PROVIDER_SITE_OTHER): Payer: Medicare Other

## 2014-08-10 DIAGNOSIS — E119 Type 2 diabetes mellitus without complications: Secondary | ICD-10-CM

## 2014-08-10 LAB — HEMOGLOBIN A1C: Hgb A1c MFr Bld: 6.7 % — ABNORMAL HIGH (ref 4.6–6.5)

## 2014-08-19 ENCOUNTER — Encounter: Payer: Self-pay | Admitting: Internal Medicine

## 2014-08-19 ENCOUNTER — Ambulatory Visit (INDEPENDENT_AMBULATORY_CARE_PROVIDER_SITE_OTHER): Payer: Medicare Other | Admitting: Internal Medicine

## 2014-08-19 VITALS — BP 124/84 | HR 80 | Temp 98.3°F | Wt 156.5 lb

## 2014-08-19 DIAGNOSIS — I10 Essential (primary) hypertension: Secondary | ICD-10-CM | POA: Diagnosis not present

## 2014-08-19 DIAGNOSIS — N400 Enlarged prostate without lower urinary tract symptoms: Secondary | ICD-10-CM

## 2014-08-19 DIAGNOSIS — E119 Type 2 diabetes mellitus without complications: Secondary | ICD-10-CM

## 2014-08-19 DIAGNOSIS — E785 Hyperlipidemia, unspecified: Secondary | ICD-10-CM | POA: Diagnosis not present

## 2014-08-19 NOTE — Assessment & Plan Note (Signed)
BP Readings from Last 3 Encounters:  08/19/14 124/84  03/20/14 144/78  03/05/14 124/71   Good control No changes needed On ARB

## 2014-08-19 NOTE — Assessment & Plan Note (Signed)
Doing well on the finasteride

## 2014-08-19 NOTE — Progress Notes (Signed)
Subjective:    Patient ID: James Braun, male    DOB: [birth year], 74 y.o.   MRN: [id number]  HPI Here for follow up of diabetes and other medical conditions  Doing well Not checking sugars--discussed checking once a month or so A1c remains fine No hypoglycemic spells No sores or pain in feet  Stays active No chest pain No SOB No dizziness or syncope No edema  Voiding okay Nocturia at most once a night No sig daytime problems  Still on red yeast rice Terrible myalgias on statin No repeat trials after discussion  Current Outpatient Prescriptions on File Prior to Visit  Medication Sig Dispense Refill  . aspirin 81 MG tablet Take 81 mg by mouth daily.     Marland Kitchen diltiazem (CARDIZEM CD) 240 MG 24 hr capsule TAKE ONE CAPSULE BY MOUTH EVERY DAY 90 capsule 0  . Efinaconazole 10 % SOLN Apply 1 drop topically daily. 8 mL 5  . finasteride (PROSCAR) 5 MG tablet TAKE 1 TABLET EVERY DAY 90 tablet 3  . metFORMIN (GLUCOPHAGE-XR) 500 MG 24 hr tablet TAKE 1 TABLET TWICE A DAY 180 tablet 2  . Multiple Vitamin (MULTIVITAMIN) tablet Take 1 tablet by mouth daily.    Marland Kitchen neomycin-polymyxin-hydrocortisone (CORTISPORIN) 3.5-10000-1 otic suspension Place 3 drops into both ears 4 (four) times daily. 10 mL 1  . potassium citrate (UROCIT-K) 10 MEQ (1080 MG) SR tablet TAKE 1 TABLET (10 MEQ TOTAL) BY MOUTH 3 (THREE) TIMES DAILY WITH MEALS. 270 tablet 3  . Red Yeast Rice Extract (RED YEAST RICE PO) Take 1 tablet by mouth daily.     Marland Kitchen telmisartan (MICARDIS) 80 MG tablet Take 1 tablet (80 mg total) by mouth every morning. 90 tablet 3  . triamterene-hydrochlorothiazide (MAXZIDE-25) 37.5-25 MG per tablet TAKE 1 TABLET BY MOUTH EVERY DAY 90 tablet 3   No current facility-administered medications on file prior to visit.    Allergies  Allergen Reactions  . Atorvastatin Other (See Comments)    REACTION: myalgias  . Morphine And Related Other (See Comments)     hypotension  . Levofloxacin Rash  . Septra  [Sulfamethoxazole-Trimethoprim] Rash    Past Medical History  Diagnosis Date  . Diabetes mellitus   . Hyperlipidemia   . Hypertension   . BPH (benign prostatic hypertrophy)   . History of melanoma excision OCT 2010  . History of kidney stones   . Nephrolithiasis BILATERAL    CURRENT RIGHT RENAL CALCULI  . Left ureteral calculus     Past Surgical History  Procedure Laterality Date  . Prostate biopsy  1998    neg  . Orif clavicle fracture  2006  . Cholecystectomy  july 2000  . Nephrolithotomy Right 09-28-2010    percutaneous  . Left ureteroscopic stone extraction  02-22-2007  . Tonsillectomy    . Extracorporeal shock wave lithotripsy  JULY 2000    X2  . Cystoscopy/retrograde/ureteroscopy/stone extraction with basket Left 11/14/2012    Procedure: CYSTOSCOPY/RETROGRADE/URETEROSCOPY/STONE EXTRACTION WITH BASKET;  Surgeon: [name], MD;  Location: [facility];  Service: Urology;  Laterality: Left;  . Holmium laser application Left 08/25/9369    Procedure: HOLMIUM LASER APPLICATION;  Surgeon: [name], MD;  Location: [facility];  Service: Urology;  Laterality: Left;  . Cystoscopy with stent placement Left 11/14/2012    Procedure: CYSTOSCOPY WITH STENT PLACEMENT;  Surgeon: [name], MD;  Location: [facility];  Service: Urology;  Laterality: Left;  . Shoulder arthroscopy  1/15  Dr [name]    Family History  Problem Relation Age of Onset  . Hypertension Mother   . Diabetes Father   . Hypertension Father   . Coronary artery disease Neg Hx   . Cancer Neg Hx     History   Social History  . Marital Status: Married    Spouse Name: N/A    Number of Children: 2  . Years of Education: N/A   Occupational History  . 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 Buys very distressed houses and rehabilitates   . Does mediation    Social History Main Topics  . Smoking status: Never Smoker   . Smokeless tobacco: Never  Used  . Alcohol Use: No  . Drug Use: No  . Sexual Activity: Not on file   Other Topics Concern  . Not on file   Social History Narrative   Has living will   Wife is health care POA---alternate is committee of physicians   Would accept resuscitation---wouldn't accept prolonged ventilation   No tube feeds if cognitively unaware   Review of Systems Sleeps well Appetite good--continues to do his low calorie 2 days per week (600 calories) Weight is stable Very satisfied with podiatric care    Objective:   Physical Exam  Constitutional: He appears well-developed and well-nourished. No distress.  Neck: Normal range of motion. Neck supple. No thyromegaly present.  Cardiovascular: Normal rate, regular rhythm, normal heart sounds and intact distal pulses.  Exam reveals no gallop.   No murmur heard. Pulmonary/Chest: Effort normal and breath sounds normal. No respiratory distress. He has no wheezes. He has no rales.  Musculoskeletal: He exhibits no edema or tenderness.  Lymphadenopathy:    He has no cervical adenopathy.  Skin:  No foot lesions  Psychiatric: He has a normal mood and affect. His behavior is normal.          Assessment & Plan:

## 2014-08-19 NOTE — Assessment & Plan Note (Signed)
Lab Results  Component Value Date   HGBA1C 6.7* 08/10/2014   Still good control Asked him to check monthly

## 2014-08-19 NOTE — Progress Notes (Signed)
Pre visit review using our clinic review tool, if applicable. No additional management support is needed unless otherwise documented below in the visit note. 

## 2014-08-19 NOTE — Assessment & Plan Note (Signed)
Intolerant of statins Pretty good on red yeast rice though

## 2014-08-22 ENCOUNTER — Other Ambulatory Visit: Payer: Self-pay | Admitting: Internal Medicine

## 2014-09-22 ENCOUNTER — Other Ambulatory Visit: Payer: Self-pay

## 2014-09-22 MED ORDER — POTASSIUM CITRATE ER 10 MEQ (1080 MG) PO TBCR: EXTENDED_RELEASE_TABLET | ORAL | Status: DC

## 2014-09-22 MED ORDER — TELMISARTAN 80 MG PO TABS: 80.0000 mg | ORAL_TABLET | Freq: Every morning | ORAL | Status: DC

## 2014-09-22 MED ORDER — TRIAMTERENE-HCTZ 37.5-25 MG PO TABS: 1.0000 | ORAL_TABLET | Freq: Every day | ORAL | Status: DC

## 2014-09-22 MED ORDER — DILTIAZEM HCL ER COATED BEADS 240 MG PO CP24: 240.0000 mg | ORAL_CAPSULE | Freq: Every day | ORAL | Status: DC

## 2014-09-22 MED ORDER — FINASTERIDE 5 MG PO TABS: 5.0000 mg | ORAL_TABLET | Freq: Every day | ORAL | Status: DC

## 2014-09-22 MED ORDER — METFORMIN HCL ER 500 MG PO TB24: 500.0000 mg | ORAL_TABLET | Freq: Two times a day (BID) | ORAL | Status: DC

## 2014-09-22 NOTE — Telephone Encounter (Signed)
Pt left note refilling triamterene-HCTZ,finasteride, telmisartan,diltiazem,metformin and Potassium to new mail order pharmacy, [facility]. Spoke with pt and advised done.(pt has already set up acct with [facility].)

## 2014-10-08 ENCOUNTER — Encounter: Payer: Self-pay | Admitting: Podiatrist

## 2014-10-08 ENCOUNTER — Ambulatory Visit (INDEPENDENT_AMBULATORY_CARE_PROVIDER_SITE_OTHER): Payer: Medicare Other | Admitting: Podiatrist

## 2014-10-08 DIAGNOSIS — B351 Tinea unguium: Secondary | ICD-10-CM

## 2014-10-08 DIAGNOSIS — M79609 Pain in unspecified limb: Principal | ICD-10-CM

## 2014-10-08 DIAGNOSIS — Q828 Other specified congenital malformations of skin: Secondary | ICD-10-CM

## 2014-10-08 DIAGNOSIS — M216X9 Other acquired deformities of unspecified foot: Secondary | ICD-10-CM

## 2014-10-08 DIAGNOSIS — E1140 Type 2 diabetes mellitus with diabetic neuropathy, unspecified: Secondary | ICD-10-CM

## 2014-10-17 NOTE — Progress Notes (Signed)
HPI: Patient presents today for follow up of foot and nail care. We have treated with laser therapy in the past-- he has been using the jublia for the last 3 months and sees improvement. Today he needs a nail trim and has painful calluses of bilateral feet.  He is diabetic and has decreased sensation   Objective: Patients chart is reviewed. Vascular status reveals pedal pulses noted at 2 out of 4 dp and pt bilateral . Neurological sensation is decreased to [employer] monofilament bilateral at 2/5 sites. Light touch and vibration are also decreased.. Patients nails are thickened, discolored, distrophic, friable and brittle with yellow-brown discoloration. Patient subjectively relates they are painful with shoes and with ambulation of bilateral feet.   Assessment: Symptomatic onychomycosis , diabetes with neuropathy, porokeratosis  Plan: Discussed treatment options and alternatives. The symptomatic toenails and lesions were debrided through manual an mechanical means without complication. Return appointment recommended at routine intervals of 3 months.

## 2014-11-11 ENCOUNTER — Telehealth: Payer: Self-pay | Admitting: Internal Medicine

## 2014-11-11 NOTE — Telephone Encounter (Signed)
Probably part of the diabetic bundle initiative I don't see any tests that he needs prior to his already schedule August labs and appts Please apologize for the inconvenience

## 2014-11-11 NOTE — Telephone Encounter (Signed)
Pt got an automated call that said to call to make sure that he is up to date on his tests. Can you please call pt at home number? Thanks.

## 2014-11-11 NOTE — Telephone Encounter (Signed)
Spoke with pt and he let me listen to the msg and it states it's a call from Christian, not sure what tests need to be up to date.

## 2014-11-11 NOTE — Telephone Encounter (Signed)
I did apologize, adrienne explained also.

## 2014-12-30 LAB — HM DIABETES EYE EXAM

## 2015-01-11 ENCOUNTER — Other Ambulatory Visit: Payer: Self-pay

## 2015-01-21 ENCOUNTER — Ambulatory Visit (INDEPENDENT_AMBULATORY_CARE_PROVIDER_SITE_OTHER): Payer: Medicare Other | Admitting: Podiatry

## 2015-01-21 DIAGNOSIS — M79676 Pain in unspecified toe(s): Secondary | ICD-10-CM

## 2015-01-21 DIAGNOSIS — B351 Tinea unguium: Secondary | ICD-10-CM | POA: Diagnosis not present

## 2015-01-21 DIAGNOSIS — E1140 Type 2 diabetes mellitus with diabetic neuropathy, unspecified: Secondary | ICD-10-CM | POA: Diagnosis not present

## 2015-01-21 DIAGNOSIS — M79609 Pain in unspecified limb: Secondary | ICD-10-CM

## 2015-01-21 NOTE — Progress Notes (Addendum)
Patient ID: James Braun, male   DOB: 12-31-1940, 74 y.o.   MRN: [id number] Complaint:  Visit Type: Patient returns to my office for continued preventative foot care services. Complaint: Patient states" my nails have grown long and thick and become painful to walk and wear shoes" Patient has been diagnosed with DM with neuropathy. He presents for preventative foot care services. No changes to ROS  Podiatric Exam: Vascular: dorsalis pedis and posterior tibial pulses are palpable bilateral. Capillary return is immediate. Temperature gradient is WNL. Skin turgor WNL  Sensorium: Normal Semmes Weinstein monofilament test. Normal tactile sensation bilaterally. Nail Exam: Pt has thick disfigured discolored nails with subungual debris noted bilateral entire nail hallux through fifth toenails Ulcer Exam: There is no evidence of ulcer or pre-ulcerative changes or infection. Orthopedic Exam: Muscle tone and strength are WNL. No limitations in general ROM. No crepitus or effusions noted. Foot type and digits show no abnormalities. Bony prominences are unremarkable. Skin: No infection or ulcers, porokeratosis sub fifth  metatarsal B/L  Diagnosis:  Tinea unguium, Pain in right toe, pain in left toes  Treatment & Plan Procedures and Treatment: Consent by patient was obtained for treatment procedures. The patient understood the discussion of treatment and procedures well. All questions were answered thoroughly reviewed. Debridement of mycotic and hypertrophic toenails, 1 through 5 bilateral and clearing of subungual debris. No ulceration, no infection noted.  Return Visit-Office Procedure: Patient instructed to return to the office for a follow up visit 3 months for continued evaluation and treatment.

## 2015-01-29 DIAGNOSIS — E119 Type 2 diabetes mellitus without complications: Secondary | ICD-10-CM | POA: Diagnosis not present

## 2015-01-29 LAB — HM DIABETES EYE EXAM

## 2015-02-11 ENCOUNTER — Other Ambulatory Visit: Payer: Self-pay | Admitting: Internal Medicine

## 2015-02-11 DIAGNOSIS — E119 Type 2 diabetes mellitus without complications: Secondary | ICD-10-CM

## 2015-02-15 ENCOUNTER — Other Ambulatory Visit (INDEPENDENT_AMBULATORY_CARE_PROVIDER_SITE_OTHER): Payer: Medicare Other

## 2015-02-15 ENCOUNTER — Other Ambulatory Visit: Payer: Self-pay | Admitting: Internal Medicine

## 2015-02-15 DIAGNOSIS — E119 Type 2 diabetes mellitus without complications: Secondary | ICD-10-CM

## 2015-02-15 DIAGNOSIS — N400 Enlarged prostate without lower urinary tract symptoms: Secondary | ICD-10-CM

## 2015-02-15 LAB — COMPREHENSIVE METABOLIC PANEL
ALBUMIN: 4.3 g/dL (ref 3.5–5.2)
ALK PHOS: 52 U/L (ref 39–117)
ALT: 19 U/L (ref 0–53)
AST: 17 U/L (ref 0–37)
BUN: 21 mg/dL (ref 6–23)
CHLORIDE: 103 meq/L (ref 96–112)
CO2: 29 mEq/L (ref 19–32)
Calcium: 9.4 mg/dL (ref 8.4–10.5)
Creatinine, Ser: 1.28 mg/dL (ref 0.40–1.50)
GFR: 58.24 mL/min — AB (ref >60.00)
Glucose, Bld: 121 mg/dL — ABNORMAL HIGH (ref 70–99)
POTASSIUM: 3.6 meq/L (ref 3.5–5.1)
Sodium: 143 mEq/L (ref 135–145)
TOTAL PROTEIN: 6.4 g/dL (ref 6.0–8.3)
Total Bilirubin: 0.5 mg/dL (ref 0.2–1.2)

## 2015-02-15 LAB — T4, FREE: FREE T4: 0.95 ng/dL (ref 0.60–1.60)

## 2015-02-15 LAB — LIPID PANEL
CHOLESTEROL: 168 mg/dL (ref 0–200)
HDL: 41.7 mg/dL (ref >39.00)
LDL CALC: 100 mg/dL — AB (ref 0–99)
NonHDL: 126.07
Total CHOL/HDL Ratio: 4
Triglycerides: 131 mg/dL (ref 0.0–149.0)
VLDL: 26.2 mg/dL (ref 0.0–40.0)

## 2015-02-15 LAB — CBC WITH DIFFERENTIAL/PLATELET
BASOS ABS: 0 10*3/uL (ref 0.0–0.1)
BASOS PCT: 0.5 % (ref 0.0–3.0)
Eosinophils Absolute: 0.1 10*3/uL (ref 0.0–0.7)
Eosinophils Relative: 2.3 % (ref 0.0–5.0)
HCT: 43.8 % (ref 39.0–52.0)
Hemoglobin: 14.8 g/dL (ref 13.0–17.0)
LYMPHS ABS: 0.9 10*3/uL (ref 0.7–4.0)
Lymphocytes Relative: 18.8 % (ref 12.0–46.0)
MCHC: 33.7 g/dL (ref 30.0–36.0)
MCV: 90.6 fl (ref 78.0–100.0)
Monocytes Absolute: 0.5 10*3/uL (ref 0.1–1.0)
Monocytes Relative: 9.9 % (ref 3.0–12.0)
Neutro Abs: 3.5 10*3/uL (ref 1.4–7.7)
Neutrophils Relative %: 68.5 % (ref 43.0–77.0)
Platelets: 270 10*3/uL (ref 150.0–400.0)
RBC: 4.84 Mil/uL (ref 4.22–5.81)
RDW: 14.9 % (ref 11.5–15.5)
WBC: 5 10*3/uL (ref 4.0–10.5)

## 2015-02-15 LAB — HEMOGLOBIN A1C: Hgb A1c MFr Bld: 6.3 % (ref 4.6–6.5)

## 2015-02-15 LAB — MICROALBUMIN / CREATININE URINE RATIO
Creatinine,U: 252.6 mg/dL
Microalb Creat Ratio: 4.8 mg/g (ref 0.0–30.0)
Microalb, Ur: 12 mg/dL — ABNORMAL HIGH (ref 0.0–1.9)

## 2015-02-15 LAB — PSA: PSA: 2.37 ng/mL (ref 0.10–4.00)

## 2015-02-15 NOTE — Addendum Note (Signed)
Addended by: Ellamae Sia on: 02/15/2015 02:33 PM   Modules accepted: Orders

## 2015-02-17 ENCOUNTER — Encounter: Payer: Self-pay | Admitting: Internal Medicine

## 2015-02-19 ENCOUNTER — Ambulatory Visit (INDEPENDENT_AMBULATORY_CARE_PROVIDER_SITE_OTHER): Payer: Medicare Other | Admitting: Internal Medicine

## 2015-02-19 ENCOUNTER — Encounter: Payer: Self-pay | Admitting: Internal Medicine

## 2015-02-19 VITALS — BP 140/80 | HR 83 | Temp 97.9°F | Ht 67.0 in | Wt 158.0 lb

## 2015-02-19 DIAGNOSIS — Z0000 Encounter for general adult medical examination without abnormal findings: Secondary | ICD-10-CM

## 2015-02-19 DIAGNOSIS — E785 Hyperlipidemia, unspecified: Secondary | ICD-10-CM | POA: Diagnosis not present

## 2015-02-19 DIAGNOSIS — Z7189 Other specified counseling: Secondary | ICD-10-CM

## 2015-02-19 DIAGNOSIS — N400 Enlarged prostate without lower urinary tract symptoms: Secondary | ICD-10-CM | POA: Diagnosis not present

## 2015-02-19 DIAGNOSIS — I10 Essential (primary) hypertension: Secondary | ICD-10-CM | POA: Diagnosis not present

## 2015-02-19 DIAGNOSIS — E119 Type 2 diabetes mellitus without complications: Secondary | ICD-10-CM | POA: Diagnosis not present

## 2015-02-19 LAB — HM DIABETES FOOT EXAM

## 2015-02-19 MED ORDER — ONETOUCH ULTRASOFT LANCETS MISC: Status: DC

## 2015-02-19 MED ORDER — NEOMYCIN-POLYMYXIN-HC 3.5-10000-1 OT SUSP
3.0000 [drp] | Freq: Four times a day (QID) | OTIC | Status: DC
Start: 2015-02-19 — End: 2015-08-23

## 2015-02-19 MED ORDER — BETAMETHASONE DIPROPIONATE 0.05 % EX LOTN: TOPICAL_LOTION | Freq: Two times a day (BID) | CUTANEOUS | Status: DC

## 2015-02-19 MED ORDER — ONETOUCH ULTRA 2 W/DEVICE KIT: PACK | Status: DC

## 2015-02-19 MED ORDER — GLUCOSE BLOOD VI STRP: ORAL_STRIP | Status: DC

## 2015-02-19 NOTE — Progress Notes (Signed)
Pre visit review using our clinic review tool, if applicable. No additional management support is needed unless otherwise documented below in the visit note. 

## 2015-02-19 NOTE — Assessment & Plan Note (Signed)
Lab Results  Component Value Date   HGBA1C 6.3 02/15/2015   Good control No changes needed

## 2015-02-19 NOTE — Assessment & Plan Note (Signed)
See social history 

## 2015-02-19 NOTE — Assessment & Plan Note (Signed)
Voids okay on the finasteride 

## 2015-02-19 NOTE — Progress Notes (Signed)
Subjective:    Patient ID: James Braun, male    DOB: Jan 07, 1941, 74 y.o.   MRN: [id number]  HPI Here for Medicare wellness and follow up of chronic medical conditions Reviewed form and advanced directives Reviewed other doctors No alcohol or tobacco Exercises regularly Vision and hearing are okay Independent with instrumental ADLs No falls No depression or anhedonia No apparent cognitive problems  Still uses the cortisporin intermittently Prone to otitis externa Has betamethasone lotion for his ears also--uses on q-tip. Discussed limiting this  Not checking sugars Control is excellent Discussed getting a machine to test intermittently No sores, numbness or pain in feet  No chest pain No palpitations No edema No headaches No dizziness or syncope  Voids okay Nocturia is rare No daytime frequency or urgency  Still on red yeast rice No myalgias  Current Outpatient Prescriptions on File Prior to Visit  Medication Sig Dispense Refill  . aspirin 81 MG tablet Take 81 mg by mouth daily.     Marland Kitchen diltiazem (CARDIZEM CD) 240 MG 24 hr capsule Take 1 capsule (240 mg total) by mouth daily. 90 capsule 1  . finasteride (PROSCAR) 5 MG tablet Take 1 tablet (5 mg total) by mouth daily. 90 tablet 1  . metFORMIN (GLUCOPHAGE-XR) 500 MG 24 hr tablet Take 1 tablet (500 mg total) by mouth 2 (two) times daily. 180 tablet 1  . Multiple Vitamin (MULTIVITAMIN) tablet Take 1 tablet by mouth daily.    Marland Kitchen neomycin-polymyxin-hydrocortisone (CORTISPORIN) 3.5-10000-1 otic suspension Place 3 drops into both ears 4 (four) times daily. 10 mL 1  . potassium citrate (UROCIT-K) 10 MEQ (1080 MG) SR tablet TAKE 1 TABLET (10 MEQ TOTAL) BY MOUTH 3 (THREE) TIMES DAILY WITH MEALS. 270 tablet 1  . Red Yeast Rice Extract (RED YEAST RICE PO) Take 1 tablet by mouth daily.     Marland Kitchen telmisartan (MICARDIS) 80 MG tablet Take 1 tablet (80 mg total) by mouth every morning. 90 tablet 1  . triamterene-hydrochlorothiazide  (MAXZIDE-25) 37.5-25 MG per tablet Take 1 tablet by mouth daily. 90 tablet 1   No current facility-administered medications on file prior to visit.    Allergies  Allergen Reactions  . Atorvastatin Other (See Comments)    REACTION: myalgias  . Morphine And Related Other (See Comments)     hypotension  . Levofloxacin Rash  . Septra [Sulfamethoxazole-Trimethoprim] Rash    Past Medical History  Diagnosis Date  . Diabetes mellitus   . Hyperlipidemia   . Hypertension   . BPH (benign prostatic hypertrophy)   . History of melanoma excision OCT 2010  . History of kidney stones   . Nephrolithiasis BILATERAL    CURRENT RIGHT RENAL CALCULI  . Left ureteral calculus     Past Surgical History  Procedure Laterality Date  . Prostate biopsy  1998    neg  . Orif clavicle fracture  2006  . Cholecystectomy  july 2000  . Nephrolithotomy Right 09-28-2010    percutaneous  . Left ureteroscopic stone extraction  02-22-2007  . Tonsillectomy    . Extracorporeal shock wave lithotripsy  JULY 2000    X2  . Cystoscopy/retrograde/ureteroscopy/stone extraction with basket Left 11/14/2012    Procedure: CYSTOSCOPY/RETROGRADE/URETEROSCOPY/STONE EXTRACTION WITH BASKET;  Surgeon: [name], MD;  Location: [facility];  Service: Urology;  Laterality: Left;  . Holmium laser application Left 0/08/5850    Procedure: HOLMIUM LASER APPLICATION;  Surgeon: [name], MD;  Location: [facility];  Service: Urology;  Laterality:  Left;  . Cystoscopy with stent placement Left 11/14/2012    Procedure: CYSTOSCOPY WITH STENT PLACEMENT;  Surgeon: [name], MD;  Location: [facility];  Service: Urology;  Laterality: Left;  . Shoulder arthroscopy  1/15    Dr [name]    Family History  Problem Relation Age of Onset  . Hypertension Mother   . Diabetes Father   . Hypertension Father   . Coronary artery disease Neg Hx   . Cancer Neg Hx     History    Social History  . Marital Status: Married    Spouse Name: N/A  . Number of Children: 2  . Years of Education: N/A   Occupational History  . 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 Buys very distressed houses and rehabilitates   . Does mediation    Social History Main Topics  . Smoking status: Never Smoker   . Smokeless tobacco: Never Used  . Alcohol Use: No  . Drug Use: No  . Sexual Activity: Not on file   Other Topics Concern  . Not on file   Social History Narrative   Has living will   Wife is health care POA---alternate is committee of physicians   Would accept resuscitation---wouldn't accept prolonged ventilation   No tube feeds if cognitively unaware   Review of Systems Early cataracts--- may need right one done Appetite is fine Weight stable Bowels are better with taking prunes daily Wears seat belt Teeth okay-- keeps up with dentist. Chronic left mouth numbness since wisdom teeth removal No arthritis problems Sees dermatologist for regular follow up due to past cancer     Objective:   Physical Exam  Constitutional: He is oriented to person, place, and time. He appears well-developed and well-nourished. No distress.  HENT:  Mouth/Throat: Oropharynx is clear and moist. No oropharyngeal exudate.  Neck: Normal range of motion. Neck supple. No thyromegaly present.  Cardiovascular: Normal rate, regular rhythm, normal heart sounds and intact distal pulses.  Exam reveals no gallop.   No murmur heard. Pulmonary/Chest: Effort normal and breath sounds normal. No respiratory distress. He has no wheezes. He has no rales.  Abdominal: Soft. There is no tenderness.  Musculoskeletal: He exhibits no edema or tenderness.  Lymphadenopathy:    He has no cervical adenopathy.  Neurological: He is alert and oriented to person, place, and time.  President-- "Obama, Bush, CLinton" [phone] [id number] Recall 3/3  Normal fine touch sensation in feet  Skin: No rash noted.  No erythema.  No foot lesions  Psychiatric: He has a normal mood and affect. His behavior is normal.          Assessment & Plan:

## 2015-02-19 NOTE — Addendum Note (Signed)
Addended by: Despina Hidden on: 02/19/2015 12:15 PM   Modules accepted: Orders, Medications

## 2015-02-19 NOTE — Assessment & Plan Note (Signed)
BP Readings from Last 3 Encounters:  02/19/15 140/80  08/19/14 124/84  03/20/14 144/78   Reasonable control

## 2015-02-19 NOTE — Patient Instructions (Signed)
Please get me a copy of your health care power of attorney.

## 2015-02-19 NOTE — Assessment & Plan Note (Signed)
I have personally reviewed the Medicare Annual Wellness questionnaire and have noted 1. The patient's medical and social history 2. Their use of alcohol, tobacco or illicit drugs 3. Their current medications and supplements 4. The patient's functional ability including ADL's, fall risks, home safety risks and hearing or visual             impairment. 5. Diet and physical activities 6. Evidence for depression or mood disorders  The patients weight, height, BMI and visual acuity have been recorded in the chart I have made referrals, counseling and provided education to the patient based review of the above and I have provided the pt with a written personalized care plan for preventive services.  I have provided you with a copy of your personalized plan for preventive services. Please take the time to review along with your updated medication list.  Will probably do another colonoscopy in a few years No more PSAs Yearly flu shot--UTD otherwise

## 2015-02-19 NOTE — Assessment & Plan Note (Signed)
No problems with red yeast rice

## 2015-03-01 DIAGNOSIS — L570 Actinic keratosis: Secondary | ICD-10-CM | POA: Diagnosis not present

## 2015-03-01 DIAGNOSIS — Z1283 Encounter for screening for malignant neoplasm of skin: Secondary | ICD-10-CM | POA: Diagnosis not present

## 2015-03-01 DIAGNOSIS — X32XXXD Exposure to sunlight, subsequent encounter: Secondary | ICD-10-CM | POA: Diagnosis not present

## 2015-03-01 DIAGNOSIS — Z08 Encounter for follow-up examination after completed treatment for malignant neoplasm: Secondary | ICD-10-CM | POA: Diagnosis not present

## 2015-03-01 DIAGNOSIS — Z85820 Personal history of malignant melanoma of skin: Secondary | ICD-10-CM | POA: Diagnosis not present

## 2015-03-10 ENCOUNTER — Encounter: Payer: Self-pay | Admitting: Internal Medicine

## 2015-03-10 ENCOUNTER — Ambulatory Visit (INDEPENDENT_AMBULATORY_CARE_PROVIDER_SITE_OTHER): Payer: Medicare Other | Admitting: Internal Medicine

## 2015-03-10 VITALS — BP 128/70 | HR 71 | Temp 98.3°F | Wt 158.0 lb

## 2015-03-10 DIAGNOSIS — E1169 Type 2 diabetes mellitus with other specified complication: Secondary | ICD-10-CM

## 2015-03-10 DIAGNOSIS — N521 Erectile dysfunction due to diseases classified elsewhere: Secondary | ICD-10-CM

## 2015-03-10 MED ORDER — SILDENAFIL CITRATE 20 MG PO TABS: 60.0000 mg | ORAL_TABLET | Freq: Every day | ORAL | Status: DC | PRN

## 2015-03-10 NOTE — Assessment & Plan Note (Signed)
Will try sildenafil

## 2015-03-10 NOTE — Progress Notes (Signed)
Subjective:    Patient ID: James Braun, male    DOB: Oct 17, 1940, 74 y.o.   MRN: [id number]  HPI Has questions about testing his sugars He hadn't been doing this at all  I asked him to check at least monthly to be sure no significant problem He needed parameters so we discussed  Has seen some ads for new diabetes meds Wonders if he needs them Discussed his excellent control  Also some ED Reassured him that it is not something surprising  Current Outpatient Prescriptions on File Prior to Visit  Medication Sig Dispense Refill  . aspirin 81 MG tablet Take 81 mg by mouth daily.     . betamethasone dipropionate 0.05 % lotion Apply topically 2 (two) times daily. 60 mL 1  . Blood Glucose Monitoring Suppl (ONE TOUCH ULTRA 2) W/DEVICE KIT Use to check blood sugar once daily dx: E11.9 1 each 0  . diltiazem (CARDIZEM CD) 240 MG 24 hr capsule Take 1 capsule (240 mg total) by mouth daily. 90 capsule 1  . finasteride (PROSCAR) 5 MG tablet Take 1 tablet (5 mg total) by mouth daily. 90 tablet 1  . glucose blood (ONE TOUCH ULTRA TEST) test strip Use to check blood sugar once daily dx: E11.9 100 each 1  . Lancets (ONETOUCH ULTRASOFT) lancets Use to check blood sugar once daily dx: E11.9 100 each 1  . metFORMIN (GLUCOPHAGE-XR) 500 MG 24 hr tablet Take 1 tablet (500 mg total) by mouth 2 (two) times daily. 180 tablet 1  . Multiple Vitamin (MULTIVITAMIN) tablet Take 1 tablet by mouth daily.    Marland Kitchen neomycin-polymyxin-hydrocortisone (CORTISPORIN) 3.5-10000-1 otic suspension Place 3 drops into both ears 4 (four) times daily. 10 mL 1  . potassium citrate (UROCIT-K) 10 MEQ (1080 MG) SR tablet TAKE 1 TABLET (10 MEQ TOTAL) BY MOUTH 3 (THREE) TIMES DAILY WITH MEALS. 270 tablet 1  . Red Yeast Rice Extract (RED YEAST RICE PO) Take 1 tablet by mouth daily.     Marland Kitchen telmisartan (MICARDIS) 80 MG tablet Take 1 tablet (80 mg total) by mouth every morning. 90 tablet 1  . triamterene-hydrochlorothiazide (MAXZIDE-25)  37.5-25 MG per tablet Take 1 tablet by mouth daily. 90 tablet 1   No current facility-administered medications on file prior to visit.    Allergies  Allergen Reactions  . Atorvastatin Other (See Comments)    REACTION: myalgias  . Morphine And Related Other (See Comments)     hypotension  . Levofloxacin Rash  . Septra [Sulfamethoxazole-Trimethoprim] Rash    Past Medical History  Diagnosis Date  . Diabetes mellitus   . Hyperlipidemia   . Hypertension   . BPH (benign prostatic hypertrophy)   . History of melanoma excision OCT 2010  . History of kidney stones   . Nephrolithiasis BILATERAL    CURRENT RIGHT RENAL CALCULI  . Left ureteral calculus     Past Surgical History  Procedure Laterality Date  . Prostate biopsy  1998    neg  . Orif clavicle fracture  2006  . Cholecystectomy  july 2000  . Nephrolithotomy Right 09-28-2010    percutaneous  . Left ureteroscopic stone extraction  02-22-2007  . Tonsillectomy    . Extracorporeal shock wave lithotripsy  JULY 2000    X2  . Cystoscopy/retrograde/ureteroscopy/stone extraction with basket Left 11/14/2012    Procedure: CYSTOSCOPY/RETROGRADE/URETEROSCOPY/STONE EXTRACTION WITH BASKET;  Surgeon: [name], MD;  Location: [facility];  Service: Urology;  Laterality: Left;  . Holmium laser application Left  11/14/2012    Procedure: HOLMIUM LASER APPLICATION;  Surgeon: [name], MD;  Location: [facility];  Service: Urology;  Laterality: Left;  . Cystoscopy with stent placement Left 11/14/2012    Procedure: CYSTOSCOPY WITH STENT PLACEMENT;  Surgeon: [name], MD;  Location: [facility];  Service: Urology;  Laterality: Left;  . Shoulder arthroscopy  1/15    Dr [name]    Family History  Problem Relation Age of Onset  . Hypertension Mother   . Diabetes Father   . Hypertension Father   . Coronary artery disease Neg Hx   . Cancer Neg Hx     Social History   Social  History  . Marital Status: Married    Spouse Name: N/A  . Number of Children: 2  . Years of Education: N/A   Occupational History  . 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 Buys very distressed houses and rehabilitates   . Does mediation    Social History Main Topics  . Smoking status: Never Smoker   . Smokeless tobacco: Never Used  . Alcohol Use: No  . Drug Use: No  . Sexual Activity: Not on file   Other Topics Concern  . Not on file   Social History Narrative   Has living will   Wife is health care POA---alternate is committee of physicians   Would accept resuscitation---wouldn't accept prolonged ventilation   No tube feeds if cognitively unaware   Review of Systems     Objective:   Physical Exam        Assessment & Plan:

## 2015-03-10 NOTE — Progress Notes (Signed)
Pre visit review using our clinic review tool, if applicable. No additional management support is needed unless otherwise documented below in the visit note. 

## 2015-03-10 NOTE — Patient Instructions (Signed)
Please let me know if your sugars are consistently over 200 fasting (and hopefully they will be under 120-130 mostly)

## 2015-03-15 ENCOUNTER — Other Ambulatory Visit: Payer: Self-pay | Admitting: Internal Medicine

## 2015-04-06 DIAGNOSIS — H02839 Dermatochalasis of unspecified eye, unspecified eyelid: Secondary | ICD-10-CM | POA: Diagnosis not present

## 2015-04-06 DIAGNOSIS — H2511 Age-related nuclear cataract, right eye: Secondary | ICD-10-CM | POA: Diagnosis not present

## 2015-04-06 DIAGNOSIS — H18411 Arcus senilis, right eye: Secondary | ICD-10-CM | POA: Diagnosis not present

## 2015-04-06 DIAGNOSIS — H18412 Arcus senilis, left eye: Secondary | ICD-10-CM | POA: Diagnosis not present

## 2015-04-29 ENCOUNTER — Ambulatory Visit: Payer: Medicare Other | Admitting: Podiatry

## 2015-04-29 ENCOUNTER — Ambulatory Visit (INDEPENDENT_AMBULATORY_CARE_PROVIDER_SITE_OTHER): Payer: Medicare Other | Admitting: Podiatry

## 2015-04-29 DIAGNOSIS — M79674 Pain in right toe(s): Secondary | ICD-10-CM

## 2015-04-29 DIAGNOSIS — Q828 Other specified congenital malformations of skin: Secondary | ICD-10-CM

## 2015-04-29 DIAGNOSIS — M79675 Pain in left toe(s): Secondary | ICD-10-CM

## 2015-04-29 DIAGNOSIS — B351 Tinea unguium: Secondary | ICD-10-CM | POA: Diagnosis not present

## 2015-04-29 DIAGNOSIS — M79676 Pain in unspecified toe(s): Secondary | ICD-10-CM

## 2015-04-29 NOTE — Progress Notes (Signed)
Patient ID: James Braun, male   DOB: [birth year], 74 y.o.   MRN: [id number]  Subjective: 74 y.o. returns the office today for painful, elongated, thickened toenails which he is unable to trim herself. Denies any redness or drainage around the nails. Denies any acute changes since last appointment and no new complaints today. Denies any systemic complaints such as fevers, chills, nausea, vomiting.   Objective: AAO 3, NAD DP/PT pulses palpable 2/4, CRT less than 3 seconds Nails hypertrophic, dystrophic, elongated, brittle, discolored 10. There is tenderness overlying the nails 1-5 bilaterally. There is no surrounding erythema or drainage along the nail sites. Hyperkeratotic lesions of bilateral submetatarsal one and 5. Upon debridement there is no underlying ulceration, drainage or other signs of infection. No open lesions or other pre-ulcerative lesions are identified. No other areas of tenderness bilateral lower extremities. No overlying edema, erythema, increased warmth. No pain with calf compression, swelling, warmth, erythema.  Assessment: Patient presents with symptomatic onychomycosis  Plan: -Treatment options including alternatives, risks, complications were discussed -Nails sharply debrided 10 without complication/bleeding. -Discussed daily foot inspection. If there are any changes, to call the office immediately.  -Follow-up in 3 months or sooner if any problems are to arise. In the meantime, encouraged to call the office with any questions, concerns, changes symptoms.  Celesta Gentile, DPM

## 2015-05-17 DIAGNOSIS — H524 Presbyopia: Secondary | ICD-10-CM | POA: Diagnosis not present

## 2015-05-17 DIAGNOSIS — H25811 Combined forms of age-related cataract, right eye: Secondary | ICD-10-CM | POA: Diagnosis not present

## 2015-05-17 DIAGNOSIS — H2511 Age-related nuclear cataract, right eye: Secondary | ICD-10-CM | POA: Diagnosis not present

## 2015-05-17 DIAGNOSIS — H5203 Hypermetropia, bilateral: Secondary | ICD-10-CM | POA: Diagnosis not present

## 2015-05-17 DIAGNOSIS — H52223 Regular astigmatism, bilateral: Secondary | ICD-10-CM | POA: Diagnosis not present

## 2015-05-17 DIAGNOSIS — H25041 Posterior subcapsular polar age-related cataract, right eye: Secondary | ICD-10-CM | POA: Diagnosis not present

## 2015-05-17 DIAGNOSIS — H25011 Cortical age-related cataract, right eye: Secondary | ICD-10-CM | POA: Diagnosis not present

## 2015-05-17 DIAGNOSIS — Z961 Presence of intraocular lens: Secondary | ICD-10-CM | POA: Diagnosis not present

## 2015-05-17 DIAGNOSIS — H1820 Unspecified corneal edema: Secondary | ICD-10-CM | POA: Diagnosis not present

## 2015-05-17 DIAGNOSIS — H5211 Myopia, right eye: Secondary | ICD-10-CM | POA: Diagnosis not present

## 2015-06-02 DIAGNOSIS — N200 Calculus of kidney: Secondary | ICD-10-CM | POA: Diagnosis not present

## 2015-06-02 DIAGNOSIS — N402 Nodular prostate without lower urinary tract symptoms: Secondary | ICD-10-CM | POA: Diagnosis not present

## 2015-06-04 ENCOUNTER — Ambulatory Visit (INDEPENDENT_AMBULATORY_CARE_PROVIDER_SITE_OTHER): Payer: Medicare Other

## 2015-06-04 DIAGNOSIS — Z23 Encounter for immunization: Secondary | ICD-10-CM

## 2015-07-26 ENCOUNTER — Ambulatory Visit: Payer: Medicare Other

## 2015-07-26 ENCOUNTER — Encounter: Payer: Self-pay | Admitting: Podiatry

## 2015-07-26 ENCOUNTER — Ambulatory Visit (INDEPENDENT_AMBULATORY_CARE_PROVIDER_SITE_OTHER): Payer: Medicare Other | Admitting: Podiatry

## 2015-07-26 ENCOUNTER — Ambulatory Visit: Payer: Medicare Other | Admitting: Podiatry

## 2015-07-26 DIAGNOSIS — M79672 Pain in left foot: Secondary | ICD-10-CM | POA: Diagnosis not present

## 2015-07-26 DIAGNOSIS — Q828 Other specified congenital malformations of skin: Secondary | ICD-10-CM | POA: Diagnosis not present

## 2015-07-26 DIAGNOSIS — M79671 Pain in right foot: Secondary | ICD-10-CM | POA: Diagnosis not present

## 2015-07-26 DIAGNOSIS — E1142 Type 2 diabetes mellitus with diabetic polyneuropathy: Secondary | ICD-10-CM | POA: Diagnosis not present

## 2015-07-26 DIAGNOSIS — B351 Tinea unguium: Secondary | ICD-10-CM | POA: Diagnosis not present

## 2015-07-26 NOTE — Progress Notes (Signed)
He presents today with a chief complaint of painfully thick elongated nails bilaterally as well as porokeratotic lesions plantar aspect sub-first subsequent metatarsals bilaterally.  Objective: Vital signs are stable he is alert and oriented 3 pulses are strongly palpable bilaterally no open lesions or wounds. Porokeratosis are noted plantar aspect bilateral foot. Toenails are thick yellow dystrophic and clinically mycotic.  Assessment: Pain and limb secondary to onychomycosis 1 through 5 bilateral. Pain in limb secondary to eye medical peripheral neuropathy as well as poor keratoses.  Plan: Debridement of nails 1 through 5 bilateral covered service secondary to pain.

## 2015-08-12 ENCOUNTER — Other Ambulatory Visit: Payer: Self-pay | Admitting: Internal Medicine

## 2015-08-12 DIAGNOSIS — E1169 Type 2 diabetes mellitus with other specified complication: Secondary | ICD-10-CM

## 2015-08-12 DIAGNOSIS — N521 Erectile dysfunction due to diseases classified elsewhere: Principal | ICD-10-CM

## 2015-08-19 ENCOUNTER — Other Ambulatory Visit (INDEPENDENT_AMBULATORY_CARE_PROVIDER_SITE_OTHER): Payer: Medicare Other

## 2015-08-19 ENCOUNTER — Other Ambulatory Visit: Payer: Self-pay | Admitting: Internal Medicine

## 2015-08-19 DIAGNOSIS — Z1159 Encounter for screening for other viral diseases: Secondary | ICD-10-CM | POA: Diagnosis not present

## 2015-08-19 DIAGNOSIS — E1169 Type 2 diabetes mellitus with other specified complication: Secondary | ICD-10-CM | POA: Diagnosis not present

## 2015-08-19 DIAGNOSIS — N521 Erectile dysfunction due to diseases classified elsewhere: Secondary | ICD-10-CM

## 2015-08-19 LAB — HEMOGLOBIN A1C: Hgb A1c MFr Bld: 6.5 % (ref 4.6–6.5)

## 2015-08-20 LAB — HEPATITIS C ANTIBODY: HCV AB: NEGATIVE

## 2015-08-23 ENCOUNTER — Ambulatory Visit (INDEPENDENT_AMBULATORY_CARE_PROVIDER_SITE_OTHER): Payer: Medicare Other | Admitting: Internal Medicine

## 2015-08-23 ENCOUNTER — Encounter: Payer: Self-pay | Admitting: Internal Medicine

## 2015-08-23 VITALS — BP 130/80 | HR 76 | Temp 98.3°F | Wt 161.0 lb

## 2015-08-23 DIAGNOSIS — E785 Hyperlipidemia, unspecified: Secondary | ICD-10-CM

## 2015-08-23 DIAGNOSIS — N400 Enlarged prostate without lower urinary tract symptoms: Secondary | ICD-10-CM | POA: Diagnosis not present

## 2015-08-23 DIAGNOSIS — I10 Essential (primary) hypertension: Secondary | ICD-10-CM | POA: Diagnosis not present

## 2015-08-23 DIAGNOSIS — E119 Type 2 diabetes mellitus without complications: Secondary | ICD-10-CM | POA: Diagnosis not present

## 2015-08-23 NOTE — Progress Notes (Signed)
Pre visit review using our clinic review tool, if applicable. No additional management support is needed unless otherwise documented below in the visit note. 

## 2015-08-23 NOTE — Assessment & Plan Note (Signed)
Lab Results  Component Value Date   LDLCALC 100* 02/15/2015   Okay on just red yeast rice

## 2015-08-23 NOTE — Assessment & Plan Note (Signed)
Good control Again asks about other meds---reassured no changes indicated

## 2015-08-23 NOTE — Progress Notes (Signed)
Subjective:    Patient ID: James Braun, male    DOB: July 16, 1941, 75 y.o.   MRN: [id number]  HPI Here for follow up of diabetes and other chronic medical conditions  Will occasionally check sugars Doing okay--- 180 random No hypoglycemic reactions No foot pain or numbness  Still on red yeast rice No myalgias or GI problems  Tried the sildenafil--but seems to be okay without it Voids okay No nocturia  No chest pain No SOB No dizziness or syncope No edema  Current Outpatient Prescriptions on File Prior to Visit  Medication Sig Dispense Refill  . aspirin 81 MG tablet Take 81 mg by mouth daily.     . betamethasone dipropionate 0.05 % lotion Apply topically 2 (two) times daily. 60 mL 1  . diltiazem (CARDIZEM CD) 240 MG 24 hr capsule Take 1 capsule by mouth  daily 90 capsule 3  . finasteride (PROSCAR) 5 MG tablet Take 1 tablet by mouth  daily 90 tablet 3  . glucose blood (ONE TOUCH ULTRA TEST) test strip Use to check blood sugar once daily dx: E11.9 100 each 1  . Lancets (ONETOUCH ULTRASOFT) lancets Use to check blood sugar once daily dx: E11.9 100 each 1  . metFORMIN (GLUCOPHAGE-XR) 500 MG 24 hr tablet Take 1 tablet by mouth two  times daily 180 tablet 3  . Multiple Vitamin (MULTIVITAMIN) tablet Take 1 tablet by mouth daily.    . Red Yeast Rice Extract (RED YEAST RICE PO) Take 1 tablet by mouth daily.     Marland Kitchen telmisartan (MICARDIS) 80 MG tablet Take 1 tablet by mouth  every morning 90 tablet 3  . triamterene-hydrochlorothiazide (MAXZIDE-25) 37.5-25 MG per tablet Take 1 tablet by mouth  daily 90 tablet 3   No current facility-administered medications on file prior to visit.    Allergies  Allergen Reactions  . Atorvastatin Other (See Comments)    REACTION: myalgias  . Morphine And Related Other (See Comments)     hypotension  . Levofloxacin Rash  . Septra [Sulfamethoxazole-Trimethoprim] Rash  . Sulfamethoxazole Rash    Past Medical History  Diagnosis Date  . Diabetes  mellitus   . Hyperlipidemia   . Hypertension   . BPH (benign prostatic hypertrophy)   . History of melanoma excision OCT 2010  . History of kidney stones   . Nephrolithiasis BILATERAL    CURRENT RIGHT RENAL CALCULI  . Left ureteral calculus     Past Surgical History  Procedure Laterality Date  . Prostate biopsy  1998    neg  . Orif clavicle fracture  2006  . Cholecystectomy  july 2000  . Nephrolithotomy Right 09-28-2010    percutaneous  . Left ureteroscopic stone extraction  02-22-2007  . Tonsillectomy    . Extracorporeal shock wave lithotripsy  JULY 2000    X2  . Cystoscopy/retrograde/ureteroscopy/stone extraction with basket Left 11/14/2012    Procedure: CYSTOSCOPY/RETROGRADE/URETEROSCOPY/STONE EXTRACTION WITH BASKET;  Surgeon: [name], MD;  Location: [facility];  Service: Urology;  Laterality: Left;  . Holmium laser application Left 123456    Procedure: HOLMIUM LASER APPLICATION;  Surgeon: [name], MD;  Location: [facility];  Service: Urology;  Laterality: Left;  . Cystoscopy with stent placement Left 11/14/2012    Procedure: CYSTOSCOPY WITH STENT PLACEMENT;  Surgeon: [name], MD;  Location: [facility];  Service: Urology;  Laterality: Left;  . Shoulder arthroscopy  1/15    Dr [name]    Family History  Problem Relation Age of Onset  . Hypertension Mother   . Diabetes Father   . Hypertension Father   . Coronary artery disease Neg Hx   . Cancer Neg Hx     Social History   Social History  . Marital Status: Married    Spouse Name: N/A  . Number of Children: 2  . Years of Education: N/A   Occupational History  . 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 Buys very distressed houses and rehabilitates   . Does mediation    Social History Main Topics  . Smoking status: Never Smoker   . Smokeless tobacco: Never Used  . Alcohol Use: No  . Drug Use: No  . Sexual Activity: Not on file   Other  Topics Concern  . Not on file   Social History Narrative   Has living will   Wife is health care POA---alternate is committee of physicians   Would accept resuscitation---wouldn't accept prolonged ventilation   No tube feeds if cognitively unaware   Review of Systems  Sleeps well Appetite is great-- still eats only 600 calories a day for 2 days a week Weight stable      Objective:   Physical Exam  Constitutional: He appears well-developed and well-nourished. No distress.  Neck: Normal range of motion. Neck supple.  Cardiovascular: Normal rate, regular rhythm, normal heart sounds and intact distal pulses.  Exam reveals no gallop.   No murmur heard. Pulmonary/Chest: Effort normal and breath sounds normal. No respiratory distress. He has no wheezes. He has no rales.  Musculoskeletal: He exhibits no edema or tenderness.  Lymphadenopathy:    He has no cervical adenopathy.  Skin: No rash noted. No erythema.  No foot lesions  Psychiatric: He has a normal mood and affect. His behavior is normal.          Assessment & Plan:

## 2015-08-23 NOTE — Assessment & Plan Note (Signed)
Voids okay 

## 2015-08-23 NOTE — Assessment & Plan Note (Signed)
BP Readings from Last 3 Encounters:  08/23/15 130/80  03/10/15 128/70  02/19/15 140/80   Good control No changes needed

## 2015-10-18 LAB — HM DIABETES EYE EXAM

## 2015-10-20 ENCOUNTER — Ambulatory Visit (INDEPENDENT_AMBULATORY_CARE_PROVIDER_SITE_OTHER): Payer: Medicare Other | Admitting: Podiatry

## 2015-10-20 ENCOUNTER — Encounter: Payer: Self-pay | Admitting: Podiatry

## 2015-10-20 DIAGNOSIS — Q828 Other specified congenital malformations of skin: Secondary | ICD-10-CM | POA: Diagnosis not present

## 2015-10-20 DIAGNOSIS — M79676 Pain in unspecified toe(s): Secondary | ICD-10-CM | POA: Diagnosis not present

## 2015-10-20 DIAGNOSIS — B351 Tinea unguium: Secondary | ICD-10-CM | POA: Diagnosis not present

## 2015-10-20 DIAGNOSIS — E1142 Type 2 diabetes mellitus with diabetic polyneuropathy: Secondary | ICD-10-CM

## 2015-10-20 NOTE — Progress Notes (Signed)
He presents today with a chief complaint of painful elongated toenails with calluses plantar aspect of the bilateral foot.  Objective: Vital signs are stable he is alert and oriented 3. Pulses are strongly palpable. Neurologic sensorium is intact per Semmes-Weinstein monofilament. Toenails are thick yellow dystrophic onychomycotic particularly on the hallux and lesser digits of the left foot more so than the right. Otherwise he does demonstrate poor keratotic lesion sub-fifth metatarsal heads bilateral with no open lesions or wounds.  Assessment: Pain in limb secondary to onychomycosis and porokeratosis.  Plan: Debridement of all reactive hyperkeratotic tissue and toenails.

## 2015-10-25 ENCOUNTER — Ambulatory Visit: Payer: Medicare Other | Admitting: Podiatry

## 2016-01-06 ENCOUNTER — Telehealth: Payer: Self-pay

## 2016-01-06 NOTE — Telephone Encounter (Signed)
Patient returned [name]'s call.  Please call patient back at [phone].

## 2016-01-06 NOTE — Telephone Encounter (Signed)
I found some forms in my folder for the pt I received  12-07-15. It had fallen between folders and was not seen until now. I called and spoke to the pt's wife, Holley Raring, per DPR. I advised her Dr [name] stated he had no documentation that would warrant the need for Diabetic Shoes. Especially the information they are requiring. He had suggested possibly Dr Milinda Pointer would have this information. She will have the pt call me back.

## 2016-01-06 NOTE — Telephone Encounter (Signed)
He said as a young adult he injured his foot. He was thinking a diabetic shoe would help with the foot pain he is having that is coming from that old injury. He agrees that he may not qualify for the shoes. He has an appointment in August. He will discuss it with Dr [name] than. I will keep the notes with me at my desk until that appointment

## 2016-01-17 ENCOUNTER — Other Ambulatory Visit: Payer: Self-pay | Admitting: Internal Medicine

## 2016-01-19 ENCOUNTER — Ambulatory Visit (INDEPENDENT_AMBULATORY_CARE_PROVIDER_SITE_OTHER): Payer: Medicare Other | Admitting: Podiatry

## 2016-01-19 ENCOUNTER — Encounter: Payer: Self-pay | Admitting: Podiatry

## 2016-01-19 DIAGNOSIS — Q828 Other specified congenital malformations of skin: Secondary | ICD-10-CM

## 2016-01-19 DIAGNOSIS — B351 Tinea unguium: Secondary | ICD-10-CM

## 2016-01-19 DIAGNOSIS — E1142 Type 2 diabetes mellitus with diabetic polyneuropathy: Secondary | ICD-10-CM

## 2016-01-19 DIAGNOSIS — M79676 Pain in unspecified toe(s): Secondary | ICD-10-CM

## 2016-01-20 NOTE — Progress Notes (Signed)
He presents today as a diabetic with a chief complaint of painful elongated toenails with corns and calluses.  Objective: Vital signs are stable he is alert and oriented 3. Pulses are palpable. No erythema edema saline as drainage or odor. Toenails are thick yellow dystrophic onychomycotic reactive porokeratotic lesions plantar aspect of the bilateral foot.  Assessment: Diabetes mellitus with pain in limb secondary to onychomycosis and porokeratosis.  Plan: Debridement of toenails 1 through 5 bilateral. Debridement of all reactive hyperkeratoses no iatrogenic lesions follow up with him in 3 months.

## 2016-02-12 ENCOUNTER — Other Ambulatory Visit: Payer: Self-pay | Admitting: Internal Medicine

## 2016-02-12 DIAGNOSIS — E119 Type 2 diabetes mellitus without complications: Secondary | ICD-10-CM

## 2016-02-14 ENCOUNTER — Other Ambulatory Visit (INDEPENDENT_AMBULATORY_CARE_PROVIDER_SITE_OTHER): Payer: Medicare Other

## 2016-02-14 DIAGNOSIS — E119 Type 2 diabetes mellitus without complications: Secondary | ICD-10-CM | POA: Diagnosis not present

## 2016-02-14 DIAGNOSIS — N400 Enlarged prostate without lower urinary tract symptoms: Secondary | ICD-10-CM | POA: Diagnosis not present

## 2016-02-14 LAB — LIPID PANEL
CHOL/HDL RATIO: 4
Cholesterol: 171 mg/dL (ref 0–200)
HDL: 40.7 mg/dL (ref >39.00)
LDL CALC: 106 mg/dL — AB (ref 0–99)
NONHDL: 130.34
Triglycerides: 122 mg/dL (ref 0.0–149.0)
VLDL: 24.4 mg/dL (ref 0.0–40.0)

## 2016-02-14 LAB — COMPREHENSIVE METABOLIC PANEL
ALBUMIN: 4.4 g/dL (ref 3.5–5.2)
ALT: 22 U/L (ref 0–53)
AST: 19 U/L (ref 0–37)
Alkaline Phosphatase: 55 U/L (ref 39–117)
BUN: 26 mg/dL — ABNORMAL HIGH (ref 6–23)
CHLORIDE: 102 meq/L (ref 96–112)
CO2: 27 mEq/L (ref 19–32)
Calcium: 9.4 mg/dL (ref 8.4–10.5)
Creatinine, Ser: 1.3 mg/dL (ref 0.40–1.50)
GFR: 57.05 mL/min — AB (ref >60.00)
Glucose, Bld: 139 mg/dL — ABNORMAL HIGH (ref 70–99)
POTASSIUM: 3.8 meq/L (ref 3.5–5.1)
Sodium: 141 mEq/L (ref 135–145)
TOTAL PROTEIN: 6.9 g/dL (ref 6.0–8.3)
Total Bilirubin: 0.5 mg/dL (ref 0.2–1.2)

## 2016-02-14 LAB — CBC WITH DIFFERENTIAL/PLATELET
Basophils Absolute: 0 10*3/uL (ref 0.0–0.1)
Basophils Relative: 0.4 % (ref 0.0–3.0)
EOS PCT: 2.2 % (ref 0.0–5.0)
Eosinophils Absolute: 0.1 10*3/uL (ref 0.0–0.7)
HEMATOCRIT: 45 % (ref 39.0–52.0)
HEMOGLOBIN: 15.2 g/dL (ref 13.0–17.0)
LYMPHS PCT: 16 % (ref 12.0–46.0)
Lymphs Abs: 0.9 10*3/uL (ref 0.7–4.0)
MCHC: 33.7 g/dL (ref 30.0–36.0)
MCV: 88.8 fl (ref 78.0–100.0)
MONOS PCT: 9.7 % (ref 3.0–12.0)
Monocytes Absolute: 0.5 10*3/uL (ref 0.1–1.0)
Neutro Abs: 3.9 10*3/uL (ref 1.4–7.7)
Neutrophils Relative %: 71.7 % (ref 43.0–77.0)
Platelets: 285 10*3/uL (ref 150.0–400.0)
RBC: 5.07 Mil/uL (ref 4.22–5.81)
RDW: 15.4 % (ref 11.5–15.5)
WBC: 5.5 10*3/uL (ref 4.0–10.5)

## 2016-02-14 LAB — HEMOGLOBIN A1C: Hgb A1c MFr Bld: 6.6 % — ABNORMAL HIGH (ref 4.6–6.5)

## 2016-02-14 LAB — PSA: PSA: 2.67 ng/mL (ref 0.10–4.00)

## 2016-02-14 LAB — T4, FREE: FREE T4: 1.01 ng/dL (ref 0.60–1.60)

## 2016-02-21 ENCOUNTER — Ambulatory Visit (INDEPENDENT_AMBULATORY_CARE_PROVIDER_SITE_OTHER): Payer: Medicare Other | Admitting: Internal Medicine

## 2016-02-21 ENCOUNTER — Encounter: Payer: Self-pay | Admitting: Internal Medicine

## 2016-02-21 ENCOUNTER — Telehealth: Payer: Self-pay | Admitting: Internal Medicine

## 2016-02-21 VITALS — BP 118/80 | HR 73 | Temp 98.3°F | Ht 65.75 in | Wt 155.5 lb

## 2016-02-21 DIAGNOSIS — I10 Essential (primary) hypertension: Secondary | ICD-10-CM

## 2016-02-21 DIAGNOSIS — Z7189 Other specified counseling: Secondary | ICD-10-CM | POA: Diagnosis not present

## 2016-02-21 DIAGNOSIS — E119 Type 2 diabetes mellitus without complications: Secondary | ICD-10-CM

## 2016-02-21 DIAGNOSIS — Z0000 Encounter for general adult medical examination without abnormal findings: Secondary | ICD-10-CM

## 2016-02-21 DIAGNOSIS — N400 Enlarged prostate without lower urinary tract symptoms: Secondary | ICD-10-CM

## 2016-02-21 LAB — HM DIABETES FOOT EXAM

## 2016-02-21 MED ORDER — TELMISARTAN 80 MG PO TABS: 80.0000 mg | ORAL_TABLET | Freq: Every morning | ORAL | 3 refills | Status: DC

## 2016-02-21 NOTE — Assessment & Plan Note (Signed)
Lab Results  Component Value Date   HGBA1C 6.6 (H) 02/14/2016   Good control still No med changes needed

## 2016-02-21 NOTE — Telephone Encounter (Signed)
Yes he will ---unless he wants me to draw them at the visit (this is what I usually do--but then they are not back for the visit, if that is what he prefers) You can set up blood work 1-2 weeks before if he prefers

## 2016-02-21 NOTE — Assessment & Plan Note (Signed)
BP Readings from Last 3 Encounters:  02/21/16 118/80  08/23/15 130/80  03/10/15 128/70   Good control

## 2016-02-21 NOTE — Progress Notes (Signed)
Subjective:    Patient ID: James Braun, male    DOB: [birth year], 75 y.o.   MRN: [id number]  HPI Here for Medicare wellness and follow up of chronic medical conditions Reviewed form and advanced directives Reviewed other doctors Vision is okay--had trouble after surgery on right. Needs glasses more now. Hearing is fair Exercises regularly No falls No depression or anhedonia Independent in instrumental ADLs No apparent cognitive problems  He has no new concerns Has chronic ear itching Using cortisporin on q-tip to clean around his ears  Checks sugars rarely Always fine No hypoglycemic reactions Left foot is chronically painful--from past injury No apparent neuropathy Does see podiatrist Has kept up with Dr Clyda Greener visit in April  Exercises regularly No chest pain No SOB No dizziness or syncope No edema  No problems with red yeast rice  Voids okay Stream is slow in AM--no real change No urgency during the day  Current Outpatient Prescriptions on File Prior to Visit  Medication Sig Dispense Refill  . aspirin 81 MG tablet Take 81 mg by mouth daily.     Marland Kitchen diltiazem (CARDIZEM CD) 240 MG 24 hr capsule Take 1 capsule by mouth  daily 90 capsule 0  . finasteride (PROSCAR) 5 MG tablet Take 1 tablet by mouth  daily 90 tablet 0  . glucose blood (ONE TOUCH ULTRA TEST) test strip Use to check blood sugar once daily dx: E11.9 100 each 1  . Lancets (ONETOUCH ULTRASOFT) lancets Use to check blood sugar once daily dx: E11.9 100 each 1  . metFORMIN (GLUCOPHAGE-XR) 500 MG 24 hr tablet Take 1 tablet by mouth  twice a day 180 tablet 0  . Multiple Vitamin (MULTIVITAMIN) tablet Take 1 tablet by mouth daily.    . Potassium Citrate 15 MEQ (1620 MG) TBCR     . Red Yeast Rice Extract (RED YEAST RICE PO) Take 1 tablet by mouth daily.     Marland Kitchen telmisartan (MICARDIS) 80 MG tablet Take 1 tablet by mouth  every morning 90 tablet 3  . triamterene-hydrochlorothiazide (MAXZIDE-25) 37.5-25 MG  tablet Take 1 tablet by mouth  daily 90 tablet 0   No current facility-administered medications on file prior to visit.     Allergies  Allergen Reactions  . Atorvastatin Other (See Comments)    REACTION: myalgias  . Morphine And Related Other (See Comments)     hypotension  . Levofloxacin Rash  . Septra [Sulfamethoxazole-Trimethoprim] Rash  . Sulfamethoxazole Rash    Past Medical History:  Diagnosis Date  . BPH (benign prostatic hypertrophy)   . Diabetes mellitus   . History of kidney stones   . History of melanoma excision OCT 2010  . Hyperlipidemia   . Hypertension   . Left ureteral calculus   . Nephrolithiasis BILATERAL   CURRENT RIGHT RENAL CALCULI    Past Surgical History:  Procedure Laterality Date  . CHOLECYSTECTOMY  july 2000  . CYSTOSCOPY WITH STENT PLACEMENT Left 11/14/2012   Procedure: CYSTOSCOPY WITH STENT PLACEMENT;  Surgeon: [name], MD;  Location[facility];  Service: Urology;  Laterality: Left;  . CYSTOSCOPY/RETROGRADE/URETEROSCOPY/STONE EXTRACTION WITH BASKET Left 11/14/2012   Procedure: CYSTOSCOPY/RETROGRADE/URETEROSCOPY/STONE EXTRACTION WITH BASKET;  Surgeon: [name], MD;  Location: [facility];  Service: Urology;  Laterality: Left;  . EXTRACORPOREAL SHOCK WAVE LITHOTRIPSY  JULY 2000   X2  . HOLMIUM LASER APPLICATION Left 123456   Procedure: HOLMIUM LASER APPLICATION;  Surgeon: [name], MD;  Location: [facility];  Service: Urology;  Laterality: Left;  . LEFT URETEROSCOPIC STONE EXTRACTION  02-22-2007  . NEPHROLITHOTOMY Right 09-28-2010   percutaneous  . ORIF CLAVICLE FRACTURE  2006  . PROSTATE BIOPSY  1998   neg  . SHOULDER ARTHROSCOPY  1/15   Dr [name]  . TONSILLECTOMY      Family History  Problem Relation Age of Onset  . Hypertension Mother   . Diabetes Father   . Hypertension Father   . Coronary artery disease Neg Hx   . Cancer Neg Hx     Social History    Social History  . Marital status: Married    Spouse name: N/A  . Number of children: 2  . Years of education: N/A   Occupational History  . 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 Buys very distressed houses and rehabilitates   . Does mediation    Social History Main Topics  . Smoking status: Never Smoker  . Smokeless tobacco: Never Used  . Alcohol use No  . Drug use: No  . Sexual activity: Not on file   Other Topics Concern  . Not on file   Social History Narrative   Has living will   Wife is health care POA---alternate is committee of physicians   Would accept resuscitation---wouldn't accept prolonged ventilation   No tube feeds if cognitively unaware   Review of Systems Appetite is good Weight is stable Sleeps well Wears seat belt No sig arthritis problems or back pain Bowels are okay--feels acidophilus helps Has little skin spots--has derm appt next week (goes yearly)    Objective:   Physical Exam  Constitutional: He is oriented to person, place, and time. He appears well-developed and well-nourished. No distress.  HENT:  Mouth/Throat: Oropharynx is clear and moist. No oropharyngeal exudate.  Neck: Normal range of motion. Neck supple. No thyromegaly present.  Cardiovascular: Normal rate, regular rhythm, normal heart sounds and intact distal pulses.  Exam reveals no gallop.   No murmur heard. Pulmonary/Chest: Effort normal. No respiratory distress. He has no wheezes. He has no rales.  Abdominal: Soft. Bowel sounds are normal. He exhibits no distension. There is no tenderness. There is no rebound and no guarding.  Musculoskeletal: He exhibits no edema or tenderness.  Lymphadenopathy:    He has no cervical adenopathy.  Neurological: He is alert and oriented to person, place, and time.  President-- "Dwaine Deter, Bush" [phone] [id number] Recall 3/3  Normal sensation in plantar feet  Skin: No rash noted. No erythema.  Slight plantar callous No ulcers   Psychiatric: He has a normal mood and affect. His behavior is normal.          Assessment & Plan:

## 2016-02-21 NOTE — Progress Notes (Signed)
Pre visit review using our clinic review tool, if applicable. No additional management support is needed unless otherwise documented below in the visit note. 

## 2016-02-21 NOTE — Assessment & Plan Note (Signed)
Well controlled with the finasteride

## 2016-02-21 NOTE — Telephone Encounter (Signed)
Pt schedule his 6 month follow up and wanted to know if he needed labs prior to that appointment

## 2016-02-21 NOTE — Assessment & Plan Note (Signed)
See social history 

## 2016-02-21 NOTE — Assessment & Plan Note (Signed)
I have personally reviewed the Medicare Annual Wellness questionnaire and have noted 1. The patient's medical and social history 2. Their use of alcohol, tobacco or illicit drugs 3. Their current medications and supplements 4. The patient's functional ability including ADL's, fall risks, home safety risks and hearing or visual             impairment. 5. Diet and physical activities 6. Evidence for depression or mood disorders  The patients weight, height, BMI and visual acuity have been recorded in the chart I have made referrals, counseling and provided education to the patient based review of the above and I have provided the pt with a written personalized care plan for preventive services.  I have provided you with a copy of your personalized plan for preventive services. Please take the time to review along with your updated medication list.  Discussed--no more PSAs Will consider colon once more in a few years (polyps) Yearly flu vaccine

## 2016-02-22 ENCOUNTER — Telehealth: Payer: Self-pay

## 2016-02-22 NOTE — Telephone Encounter (Signed)
Telmisartan was electronically sent to [facility] yesterday by accident. Spoke to [place] at [place] just now and cancelled the refill.

## 2016-02-23 NOTE — Telephone Encounter (Signed)
Left message asking pt to call office  °

## 2016-02-23 NOTE — Telephone Encounter (Signed)
Lab 2/5

## 2016-03-01 DIAGNOSIS — L218 Other seborrheic dermatitis: Secondary | ICD-10-CM | POA: Diagnosis not present

## 2016-03-01 DIAGNOSIS — L821 Other seborrheic keratosis: Secondary | ICD-10-CM | POA: Diagnosis not present

## 2016-03-01 DIAGNOSIS — L820 Inflamed seborrheic keratosis: Secondary | ICD-10-CM | POA: Diagnosis not present

## 2016-03-01 DIAGNOSIS — Z08 Encounter for follow-up examination after completed treatment for malignant neoplasm: Secondary | ICD-10-CM | POA: Diagnosis not present

## 2016-03-01 DIAGNOSIS — Z85820 Personal history of malignant melanoma of skin: Secondary | ICD-10-CM | POA: Diagnosis not present

## 2016-04-10 ENCOUNTER — Other Ambulatory Visit: Payer: Self-pay | Admitting: Internal Medicine

## 2016-04-24 ENCOUNTER — Ambulatory Visit (INDEPENDENT_AMBULATORY_CARE_PROVIDER_SITE_OTHER): Payer: Medicare Other | Admitting: Podiatry

## 2016-04-24 ENCOUNTER — Encounter: Payer: Self-pay | Admitting: Podiatry

## 2016-04-24 DIAGNOSIS — B351 Tinea unguium: Secondary | ICD-10-CM | POA: Diagnosis not present

## 2016-04-24 DIAGNOSIS — E1142 Type 2 diabetes mellitus with diabetic polyneuropathy: Secondary | ICD-10-CM | POA: Diagnosis not present

## 2016-04-24 DIAGNOSIS — Q828 Other specified congenital malformations of skin: Secondary | ICD-10-CM | POA: Diagnosis not present

## 2016-04-24 DIAGNOSIS — M79676 Pain in unspecified toe(s): Secondary | ICD-10-CM | POA: Diagnosis not present

## 2016-04-25 NOTE — Progress Notes (Signed)
He presents today with chief complaint of painful elongated toenails with multiple corns and calluses plantar aspect bilateral foot that are painful as he ambulates.  Objective: Vital signs are stable is alert and oriented 3 toenails are thick yellow dystrophic onychomycotic. Pulses remain palpable. Diminished pulses per posterior tibial tendon. Diminished neurologic sensations are vibratory sensation. Multiple porokeratotic lesions plantar aspect bilateral foot do not demonstrate any type of skin breakdown. Hammertoe deformities are present bilaterally diminished posterior tibial pulses.  Assessment: Diabetes mellitus without complications with pain in limb secondary to onychomycosis porokeratosis. Mild peripheral vascular disease. With hammertoe deformities.  Plan: Reviewed toenails 1 through 5 bilateral. Debridement of all reactive hyperkeratosis. Consider diabetic shoes.

## 2016-05-22 DIAGNOSIS — N402 Nodular prostate without lower urinary tract symptoms: Secondary | ICD-10-CM | POA: Diagnosis not present

## 2016-05-22 DIAGNOSIS — N202 Calculus of kidney with calculus of ureter: Secondary | ICD-10-CM | POA: Diagnosis not present

## 2016-05-29 ENCOUNTER — Ambulatory Visit (INDEPENDENT_AMBULATORY_CARE_PROVIDER_SITE_OTHER): Payer: Medicare Other

## 2016-05-29 ENCOUNTER — Encounter: Payer: Self-pay | Admitting: Internal Medicine

## 2016-05-29 DIAGNOSIS — Z23 Encounter for immunization: Secondary | ICD-10-CM | POA: Diagnosis not present

## 2016-05-29 DIAGNOSIS — E089 Diabetes mellitus due to underlying condition without complications: Secondary | ICD-10-CM | POA: Diagnosis not present

## 2016-05-29 DIAGNOSIS — H524 Presbyopia: Secondary | ICD-10-CM | POA: Diagnosis not present

## 2016-05-29 DIAGNOSIS — E119 Type 2 diabetes mellitus without complications: Secondary | ICD-10-CM | POA: Diagnosis not present

## 2016-05-29 DIAGNOSIS — H1851 Endothelial corneal dystrophy: Secondary | ICD-10-CM | POA: Diagnosis not present

## 2016-05-29 LAB — HM DIABETES EYE EXAM

## 2016-06-02 ENCOUNTER — Ambulatory Visit: Payer: Medicare Other

## 2016-06-15 DIAGNOSIS — H1851 Endothelial corneal dystrophy: Secondary | ICD-10-CM | POA: Diagnosis not present

## 2016-06-16 DIAGNOSIS — N201 Calculus of ureter: Secondary | ICD-10-CM | POA: Diagnosis not present

## 2016-06-19 ENCOUNTER — Other Ambulatory Visit: Payer: Self-pay | Admitting: Urology

## 2016-06-22 ENCOUNTER — Encounter (HOSPITAL_BASED_OUTPATIENT_CLINIC_OR_DEPARTMENT_OTHER): Payer: Self-pay | Admitting: *Deleted

## 2016-06-22 DIAGNOSIS — N201 Calculus of ureter: Secondary | ICD-10-CM | POA: Diagnosis not present

## 2016-06-22 NOTE — Progress Notes (Signed)
NPO AFTER MN.  ARRIVE AT 0600.  NEEDS ISTAT AND EKG.  WILL TAKE PROSCAR, CARDIZEM, AND MICARDIS AM DOS W/ SIPS OF WATER.  PER PT POSS. CANCEL CASE MAY HAVE PASSED STONE , GETTING CT AT OFFICE Friday 06-23-2016.

## 2016-06-23 DIAGNOSIS — N201 Calculus of ureter: Secondary | ICD-10-CM | POA: Diagnosis not present

## 2016-06-26 ENCOUNTER — Ambulatory Visit (HOSPITAL_BASED_OUTPATIENT_CLINIC_OR_DEPARTMENT_OTHER): Admission: RE | Admit: 2016-06-26 | Payer: Medicare Other | Source: Ambulatory Visit | Admitting: Urology

## 2016-06-26 HISTORY — DX: Type 2 diabetes mellitus without complications: E11.9

## 2016-06-26 SURGERY — CYSTOSCOPY, WITH CALCULUS REMOVAL USING BASKET
Anesthesia: General | Laterality: Left

## 2016-07-26 ENCOUNTER — Ambulatory Visit (INDEPENDENT_AMBULATORY_CARE_PROVIDER_SITE_OTHER): Payer: PPO | Admitting: Podiatry

## 2016-07-26 ENCOUNTER — Encounter: Payer: Self-pay | Admitting: Podiatry

## 2016-07-26 DIAGNOSIS — M2041 Other hammer toe(s) (acquired), right foot: Secondary | ICD-10-CM

## 2016-07-26 DIAGNOSIS — E1142 Type 2 diabetes mellitus with diabetic polyneuropathy: Secondary | ICD-10-CM

## 2016-07-26 DIAGNOSIS — M79676 Pain in unspecified toe(s): Secondary | ICD-10-CM | POA: Diagnosis not present

## 2016-07-26 DIAGNOSIS — Q828 Other specified congenital malformations of skin: Secondary | ICD-10-CM | POA: Diagnosis not present

## 2016-07-26 DIAGNOSIS — B351 Tinea unguium: Secondary | ICD-10-CM

## 2016-07-26 DIAGNOSIS — M2042 Other hammer toe(s) (acquired), left foot: Secondary | ICD-10-CM

## 2016-07-26 DIAGNOSIS — M2040 Other hammer toe(s) (acquired), unspecified foot: Secondary | ICD-10-CM

## 2016-07-26 NOTE — Progress Notes (Signed)
He presents today chief complaint of painful elongated toenails is also complaining of painful porokeratotic lesions that are exquisitely painful bilaterally. He states that he would like to consider a pair of diabetic shoes.  Objective: Vital signs are stable alert and oriented 3. Pulses are palpable. This non-insulin-dependent diabetic male presents with strong palpable pulses slightly delayed capillary fill time normal neurologic sensorium. He does have hammertoe deformities with multiple pre-ulcerative lesions beneath the first and fifth metatarsals of the left foot fifth metatarsal of the right foot and the left heel. His toenails are thick yellow dystrophic onychomycotic. No open lesions or wounds at this point.  Assessment: Pain in limb secondary to onychomycosis and hammertoe deformities with porokeratotic lesions.  Plan: Debridement of all reactive hyperkeratotic tissue debridement of nails 1 through 5 bilateral initiated paperwork to be centered to his primary care provider for diabetic shoe.

## 2016-07-28 ENCOUNTER — Telehealth: Payer: Self-pay | Admitting: Podiatry

## 2016-08-12 ENCOUNTER — Other Ambulatory Visit: Payer: Self-pay | Admitting: Internal Medicine

## 2016-08-12 DIAGNOSIS — E119 Type 2 diabetes mellitus without complications: Secondary | ICD-10-CM

## 2016-08-21 ENCOUNTER — Other Ambulatory Visit (INDEPENDENT_AMBULATORY_CARE_PROVIDER_SITE_OTHER): Payer: PPO

## 2016-08-21 DIAGNOSIS — N400 Enlarged prostate without lower urinary tract symptoms: Secondary | ICD-10-CM | POA: Diagnosis not present

## 2016-08-21 DIAGNOSIS — E119 Type 2 diabetes mellitus without complications: Secondary | ICD-10-CM

## 2016-08-21 LAB — PSA, MEDICARE: PSA: 2.77 ng/mL (ref 0.10–4.00)

## 2016-08-21 LAB — HEMOGLOBIN A1C: HEMOGLOBIN A1C: 6.7 % — AB (ref 4.6–6.5)

## 2016-08-21 NOTE — Addendum Note (Signed)
Addended by: Ellamae Sia on: 08/21/2016 10:12 AM   Modules accepted: Orders

## 2016-08-21 NOTE — Addendum Note (Signed)
Addended by: Ellamae Sia on: 08/21/2016 08:30 AM   Modules accepted: Orders

## 2016-08-23 ENCOUNTER — Ambulatory Visit (INDEPENDENT_AMBULATORY_CARE_PROVIDER_SITE_OTHER): Payer: Self-pay | Admitting: *Deleted

## 2016-08-23 DIAGNOSIS — M2042 Other hammer toe(s) (acquired), left foot: Secondary | ICD-10-CM

## 2016-08-23 DIAGNOSIS — E1142 Type 2 diabetes mellitus with diabetic polyneuropathy: Secondary | ICD-10-CM

## 2016-08-23 DIAGNOSIS — M2041 Other hammer toe(s) (acquired), right foot: Secondary | ICD-10-CM

## 2016-08-23 NOTE — Progress Notes (Signed)
Measured for diabetic shoes and insoles today. Will place order and notify patient on arrival. 

## 2016-08-24 NOTE — Telephone Encounter (Signed)
error 

## 2016-08-28 ENCOUNTER — Ambulatory Visit: Payer: Medicare Other | Admitting: Internal Medicine

## 2016-08-30 ENCOUNTER — Encounter: Payer: Self-pay | Admitting: Internal Medicine

## 2016-08-30 ENCOUNTER — Ambulatory Visit (INDEPENDENT_AMBULATORY_CARE_PROVIDER_SITE_OTHER): Payer: PPO | Admitting: Internal Medicine

## 2016-08-30 VITALS — BP 126/88 | HR 80 | Temp 98.1°F | Wt 159.0 lb

## 2016-08-30 DIAGNOSIS — E785 Hyperlipidemia, unspecified: Secondary | ICD-10-CM

## 2016-08-30 DIAGNOSIS — N400 Enlarged prostate without lower urinary tract symptoms: Secondary | ICD-10-CM

## 2016-08-30 DIAGNOSIS — E1140 Type 2 diabetes mellitus with diabetic neuropathy, unspecified: Secondary | ICD-10-CM

## 2016-08-30 DIAGNOSIS — I10 Essential (primary) hypertension: Secondary | ICD-10-CM

## 2016-08-30 LAB — HM DIABETES EYE EXAM

## 2016-08-30 MED ORDER — TELMISARTAN 80 MG PO TABS: 80.0000 mg | ORAL_TABLET | Freq: Every morning | ORAL | 3 refills | Status: DC

## 2016-08-30 MED ORDER — POTASSIUM CITRATE ER 15 MEQ (1620 MG) PO TBCR: 1.0000 | EXTENDED_RELEASE_TABLET | Freq: Three times a day (TID) | ORAL | 3 refills | Status: DC

## 2016-08-30 MED ORDER — METFORMIN HCL ER 500 MG PO TB24: 500.0000 mg | ORAL_TABLET | Freq: Two times a day (BID) | ORAL | 3 refills | Status: DC

## 2016-08-30 MED ORDER — TRIAMTERENE-HCTZ 37.5-25 MG PO TABS: 1.0000 | ORAL_TABLET | Freq: Every day | ORAL | 3 refills | Status: DC

## 2016-08-30 MED ORDER — FINASTERIDE 5 MG PO TABS: 5.0000 mg | ORAL_TABLET | Freq: Every day | ORAL | 3 refills | Status: DC

## 2016-08-30 MED ORDER — DILTIAZEM HCL ER COATED BEADS 240 MG PO CP24: 240.0000 mg | ORAL_CAPSULE | Freq: Every day | ORAL | 3 refills | Status: DC

## 2016-08-30 NOTE — Assessment & Plan Note (Signed)
Voids well  Continues on the finasteride

## 2016-08-30 NOTE — Assessment & Plan Note (Signed)
Control is fair with the red yeast rice Intolerant of statins

## 2016-08-30 NOTE — Assessment & Plan Note (Signed)
Good control Lab Results  Component Value Date   HGBA1C 6.7 (H) 08/21/2016   Early neuropathy--no Rx needed

## 2016-08-30 NOTE — Assessment & Plan Note (Signed)
BP Readings from Last 3 Encounters:  08/30/16 126/88  02/21/16 118/80  08/23/15 130/80   Good control still No change needed

## 2016-08-30 NOTE — Progress Notes (Signed)
Subjective:    Patient ID: James Braun, male    DOB: Oct 28, 1940, 76 y.o.   MRN: [id number]  HPI Here for follow up of diabetes and other chronic health conditions  Wife just came down the flu Actually GI symptoms He asks for tamiflu No cough, fever etc for him as yet She has no respiratory symptoms--so will hold off on Rx  Diabetes seems to be okay Not really checking his sugars--only rarely Considering cutting out bread Podiatrist noted some sensory changes in feet (some pain) A1c was fine  No recent kidney stones Continues on the potassium citrate Urologist requests PSA--discussed my concerns at his age It was normal so reassuring  HTN seems to be controlled He doesn't check No headaches No chest pain or SOB No change in exercise tolerance No dizziness or syncope No edema  Taking the red yeast rice for cholesterol LDL was only 106 He tries to work on healthy lifestyle  Current Outpatient Prescriptions on File Prior to Visit  Medication Sig Dispense Refill  . aspirin 81 MG tablet Take 81 mg by mouth daily.     Marland Kitchen diltiazem (CARDIZEM CD) 240 MG 24 hr capsule TAKE 1 CAPSULE BY MOUTH  DAILY (Patient taking differently: TAKE 1 CAPSULE BY MOUTH  DAILY-- takes in am) 90 capsule 1  . finasteride (PROSCAR) 5 MG tablet TAKE 1 TABLET BY MOUTH  DAILY (Patient taking differently: TAKE 1 TABLET BY MOUTH  DAILY-- takes in am) 90 tablet 1  . metFORMIN (GLUCOPHAGE-XR) 500 MG 24 hr tablet TAKE 1 TABLET BY MOUTH  TWICE A DAY 180 tablet 1  . Multiple Vitamin (MULTIVITAMIN) tablet Take 1 tablet by mouth daily.    . Potassium Citrate 15 MEQ (1620 MG) TBCR Take 1 tablet by mouth 3 (three) times daily.     . Red Yeast Rice Extract (RED YEAST RICE PO) Take 1 tablet by mouth daily.     Marland Kitchen telmisartan (MICARDIS) 80 MG tablet Take 1 tablet (80 mg total) by mouth every morning. 90 tablet 3  . triamterene-hydrochlorothiazide (MAXZIDE-25) 37.5-25 MG tablet TAKE 1 TABLET BY MOUTH  DAILY (Patient  taking differently: TAKE 1 TABLET BY MOUTH  DAILY-- takes in am) 90 tablet 1   No current facility-administered medications on file prior to visit.     Allergies  Allergen Reactions  . Atorvastatin Other (See Comments)    REACTION: myalgias  . Morphine And Related Other (See Comments)     hypotension  . Levofloxacin Rash  . Septra [Sulfamethoxazole-Trimethoprim] Rash  . Sulfamethoxazole Rash    Past Medical History:  Diagnosis Date  . BPH (benign prostatic hypertrophy)   . History of kidney stones   . History of melanoma excision OCT 2010  . Hyperlipidemia   . Hypertension   . Left ureteral calculus   . Nephrolithiasis    bilateral  . Type 2 diabetes mellitus ([place])     Past Surgical History:  Procedure Laterality Date  . CHOLECYSTECTOMY  july 2000  . CYSTOSCOPY WITH STENT PLACEMENT Left 11/14/2012   Procedure: CYSTOSCOPY WITH STENT PLACEMENT;  Surgeon: [name], MD;  Location: [facility];  Service: Urology;  Laterality: Left;  . CYSTOSCOPY/RETROGRADE/URETEROSCOPY/STONE EXTRACTION WITH BASKET Left 11/14/2012   Procedure: CYSTOSCOPY/RETROGRADE/URETEROSCOPY/STONE EXTRACTION WITH BASKET;  Surgeon: [name], MD;  Location: [facility];  Service: Urology;  Laterality: Left;  . EXTRACORPOREAL SHOCK WAVE LITHOTRIPSY  JULY 2000   X2  . HOLMIUM LASER APPLICATION Left [id number]   Procedure:  HOLMIUM LASER APPLICATION;  Surgeon: [name], MD;  Location: [facility];  Service: Urology;  Laterality: Left;  . LEFT URETEROSCOPIC STONE EXTRACTION  02-22-2007  . NEPHROLITHOTOMY Right 09-28-2010   percutaneous  . ORIF CLAVICLE FRACTURE  2006  . PROSTATE BIOPSY  1998   neg  . SHOULDER ARTHROSCOPY  1/15   Dr [name]  . TONSILLECTOMY      Family History  Problem Relation Age of Onset  . Hypertension Mother   . Diabetes Father   . Hypertension Father   . Coronary artery disease Neg Hx   . Cancer Neg Hx     Social  History   Social History  . Marital status: Married    Spouse name: N/A  . Number of children: 2  . Years of education: N/A   Occupational History  . 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 Buys very distressed houses and rehabilitates   . Does mediation    Social History Main Topics  . Smoking status: Never Smoker  . Smokeless tobacco: Never Used  . Alcohol use No  . Drug use: No  . Sexual activity: Not on file   Other Topics Concern  . Not on file   Social History Narrative   Has living will   Wife is health care POA---alternate is committee of physicians   Would accept resuscitation---wouldn't accept prolonged ventilation   No tube feeds if cognitively unaware   Review of Systems Appetite is good Weight stable Sleeps well Bowels are moving fine with prunes    Objective:   Physical Exam  Constitutional: He appears well-nourished. No distress.  Neck: No thyromegaly present.  Cardiovascular: Normal rate, regular rhythm, normal heart sounds and intact distal pulses.  Exam reveals no gallop.   No murmur heard. Pulmonary/Chest: Effort normal and breath sounds normal. No respiratory distress. He has no wheezes. He has no rales.  Musculoskeletal: He exhibits no edema or tenderness.  Lymphadenopathy:    He has no cervical adenopathy.  Skin:  No foot lesions  Psychiatric: He has a normal mood and affect. His behavior is normal.          Assessment & Plan:

## 2016-08-30 NOTE — Progress Notes (Signed)
Pre visit review using our clinic review tool, if applicable. No additional management support is needed unless otherwise documented below in the visit note. 

## 2016-08-31 ENCOUNTER — Other Ambulatory Visit: Payer: Self-pay

## 2016-08-31 MED ORDER — POTASSIUM CITRATE ER 15 MEQ (1620 MG) PO TBCR: 1.0000 | EXTENDED_RELEASE_TABLET | Freq: Three times a day (TID) | ORAL | 3 refills | Status: DC

## 2016-08-31 NOTE — Telephone Encounter (Signed)
Pt was seen 08/30/16 and given 90 day supplies on all meds except the K refill was for # 120 instead of # 270.pt request 3 months K on printed rx so he can take to another pharmacy if needed. Request sent to Dr [name] and pt request cb when ready for pickup.

## 2016-08-31 NOTE — Telephone Encounter (Signed)
Spoke to pt. Rx up front ready for pickup 

## 2016-09-26 DIAGNOSIS — Z961 Presence of intraocular lens: Secondary | ICD-10-CM | POA: Diagnosis not present

## 2016-09-26 DIAGNOSIS — H2512 Age-related nuclear cataract, left eye: Secondary | ICD-10-CM | POA: Diagnosis not present

## 2016-09-26 DIAGNOSIS — H40003 Preglaucoma, unspecified, bilateral: Secondary | ICD-10-CM | POA: Diagnosis not present

## 2016-09-26 DIAGNOSIS — I10 Essential (primary) hypertension: Secondary | ICD-10-CM | POA: Diagnosis not present

## 2016-09-26 DIAGNOSIS — E119 Type 2 diabetes mellitus without complications: Secondary | ICD-10-CM | POA: Diagnosis not present

## 2016-10-10 ENCOUNTER — Encounter: Payer: Self-pay | Admitting: Internal Medicine

## 2016-10-23 ENCOUNTER — Encounter: Payer: Self-pay | Admitting: Podiatry

## 2016-10-23 ENCOUNTER — Ambulatory Visit (INDEPENDENT_AMBULATORY_CARE_PROVIDER_SITE_OTHER): Payer: PPO | Admitting: Podiatry

## 2016-10-23 DIAGNOSIS — B351 Tinea unguium: Secondary | ICD-10-CM

## 2016-10-23 DIAGNOSIS — E1140 Type 2 diabetes mellitus with diabetic neuropathy, unspecified: Secondary | ICD-10-CM

## 2016-10-23 DIAGNOSIS — Q828 Other specified congenital malformations of skin: Secondary | ICD-10-CM

## 2016-10-23 DIAGNOSIS — M79676 Pain in unspecified toe(s): Secondary | ICD-10-CM | POA: Diagnosis not present

## 2016-10-23 DIAGNOSIS — M2040 Other hammer toe(s) (acquired), unspecified foot: Secondary | ICD-10-CM

## 2016-10-23 DIAGNOSIS — M722 Plantar fascial fibromatosis: Secondary | ICD-10-CM

## 2016-10-23 NOTE — Progress Notes (Signed)
Patient ID: James Braun, male   DOB: 03-01-1941, 76 y.o.   MRN: [id number] Patient presents for diabetic shoe pick up, shoes are tried on for good fit.  Patient received 1 Pair and 3 pairs custom molded diabetic inserts.  Verbal and written break in and wear instructions given.  Patient will follow up for scheduled routine care.   Complaint:  Visit Type: Patient returns to my office for continued preventative foot care services. Complaint: Patient states" my nails have grown long and thick and become painful to walk and wear shoes" Patient has been diagnosed with DM with no preulcerative lesions and hammer toes . The patient presents for preventative foot care services. No changes to ROS  Podiatric Exam: Vascular: dorsalis pedis and posterior tibial pulses are palpable bilateral. Capillary return is immediate. Temperature gradient is WNL. Skin turgor WNL  Sensorium: Diminished  Semmes Weinstein monofilament test. Normal tactile sensation bilaterally. Nail Exam: Pt has thick disfigured discolored nails with subungual debris noted bilateral entire nail hallux through fifth toenails Ulcer Exam: There is no evidence of ulcer or pre-ulcerative changes or infection. Orthopedic Exam: Muscle tone and strength are WNL. No limitations in general ROM. No crepitus or effusions noted. Foot type and digits show no abnormalities. Bony prominences are unremarkable. Skin:  Porokeratosis sub 1,5 and heel left  foot. No infection or ulcers  Diagnosis:  Onychomycosis, , Pain in right toe, pain in left toes. Porokeratosis  Left foot  Treatment & Plan Procedures and Treatment: Consent by patient was obtained for treatment procedures. The patient understood the discussion of treatment and procedures well. All questions were answered thoroughly reviewed. Debridement of mycotic and hypertrophic toenails, 1 through 5 bilateral and clearing of subungual debris. No ulceration, no infection noted. Debride porokeratosis left  foot. Return Visit-Office Procedure: Patient instructed to return to the office for a follow up visit 3 months for continued evaluation and treatment. Patient presents today and was dispensed 0ne pair ( two units) of medically necessary extra depth shoes with three pair( six units) of custom molded multiple density inserts. The shoes and the inserts are fitted to the patients ' feet and are noted to fit well and are free of defect.  Length and width of the shoes are also acceptable.  Patient was given written and verbal  instructions for wearing.  If any concerns arrive with the shoes or inserts, the patient is to call the office.Patient is to follow up with doctor in six weeks.    Gardiner Barefoot DPM

## 2016-10-23 NOTE — Patient Instructions (Signed)

## 2016-10-24 DIAGNOSIS — H401131 Primary open-angle glaucoma, bilateral, mild stage: Secondary | ICD-10-CM | POA: Diagnosis not present

## 2016-10-24 LAB — HM DIABETES EYE EXAM

## 2016-10-30 ENCOUNTER — Other Ambulatory Visit: Payer: PPO

## 2016-12-04 DIAGNOSIS — H40003 Preglaucoma, unspecified, bilateral: Secondary | ICD-10-CM | POA: Diagnosis not present

## 2016-12-04 DIAGNOSIS — E119 Type 2 diabetes mellitus without complications: Secondary | ICD-10-CM | POA: Diagnosis not present

## 2016-12-04 LAB — HM DIABETES EYE EXAM

## 2016-12-20 ENCOUNTER — Encounter: Payer: Self-pay | Admitting: Internal Medicine

## 2017-01-15 DIAGNOSIS — H109 Unspecified conjunctivitis: Secondary | ICD-10-CM | POA: Diagnosis not present

## 2017-01-19 ENCOUNTER — Ambulatory Visit (INDEPENDENT_AMBULATORY_CARE_PROVIDER_SITE_OTHER): Payer: PPO | Admitting: Family Medicine

## 2017-01-19 ENCOUNTER — Encounter: Payer: Self-pay | Admitting: Family Medicine

## 2017-01-19 ENCOUNTER — Ambulatory Visit: Payer: PPO | Admitting: Internal Medicine

## 2017-01-19 ENCOUNTER — Ambulatory Visit: Payer: PPO | Admitting: Family Medicine

## 2017-01-19 DIAGNOSIS — R05 Cough: Secondary | ICD-10-CM | POA: Diagnosis not present

## 2017-01-19 DIAGNOSIS — R053 Chronic cough: Secondary | ICD-10-CM

## 2017-01-19 DIAGNOSIS — H1032 Unspecified acute conjunctivitis, left eye: Secondary | ICD-10-CM | POA: Diagnosis not present

## 2017-01-19 MED ORDER — AZITHROMYCIN 250 MG PO TABS: ORAL_TABLET | ORAL | 0 refills | Status: DC

## 2017-01-19 NOTE — Progress Notes (Signed)
   Subjective:    Patient ID: James Braun, male    DOB: August 02, 1940, 76 y.o.   MRN: [id number]  Cough  This is a new problem. The current episode started 1 to 4 weeks ago (3 weeks). The cough is productive of purulent sputum. Associated symptoms include nasal congestion and a sore throat. Pertinent negatives include no chills, ear pain, fever, headaches, postnasal drip, shortness of breath or wheezing. Associated symptoms comments:  Left eye redness, eye discharge, stuck together. The symptoms are aggravated by lying down. Risk factors: nonsmoker. Treatments tried:  zicam, cough drops, ibuprofen. The treatment provided moderate relief. There is no history of asthma, COPD or environmental allergies.  DM  Sore Throat   Associated symptoms include coughing. Pertinent negatives include no ear pain, headaches or shortness of breath.    Saw  Opthalmologic on 7/2... Treated for bacterial conjunctivitis:  Prednisolone,  Moxiflaxin.. Eye symptoms now resolved.   no recent antibitoics.  Review of Systems  Constitutional: Negative for chills and fever.  HENT: Positive for sore throat. Negative for ear pain and postnasal drip.   Respiratory: Positive for cough. Negative for shortness of breath and wheezing.   Allergic/Immunologic: Negative for environmental allergies.  Neurological: Negative for headaches.       Objective:   Physical Exam  Constitutional: Vital signs are normal. He appears well-developed and well-nourished.  Non-toxic appearance. He does not appear ill. No distress.  HENT:  Head: Normocephalic and atraumatic.  Right Ear: Hearing, tympanic membrane, external ear and ear canal normal. No tenderness. No foreign bodies. Tympanic membrane is not retracted and not bulging.  Left Ear: Hearing, tympanic membrane, external ear and ear canal normal. No tenderness. No foreign bodies. Tympanic membrane is not retracted and not bulging.  Nose: Mucosal edema and rhinorrhea present. Right sinus  exhibits no maxillary sinus tenderness and no frontal sinus tenderness. Left sinus exhibits no maxillary sinus tenderness and no frontal sinus tenderness.  Mouth/Throat: Uvula is midline and mucous membranes are normal. Normal dentition. No dental caries. Posterior oropharyngeal erythema present. No oropharyngeal exudate or tonsillar abscesses.  Eyes: Conjunctivae, EOM and lids are normal. Pupils are equal, round, and reactive to light. Lids are everted and swept, no foreign bodies found.  Neck: Trachea normal, normal range of motion and phonation normal. Neck supple. Carotid bruit is not present. No thyroid mass and no thyromegaly present.  Cardiovascular: Normal rate, regular rhythm, S1 normal, S2 normal, normal heart sounds, intact distal pulses and normal pulses.  Exam reveals no gallop.   No murmur heard. Pulmonary/Chest: Effort normal and breath sounds normal. No respiratory distress. He has no wheezes. He has no rhonchi. He has no rales.  Abdominal: Soft. Normal appearance and bowel sounds are normal. There is no hepatosplenomegaly. There is no tenderness. There is no rebound, no guarding and no CVA tenderness. No hernia.  Neurological: He is alert. He has normal reflexes.  Skin: Skin is warm, dry and intact. No rash noted.  Psychiatric: He has a normal mood and affect. His speech is normal and behavior is normal. Judgment normal.          Assessment & Plan:

## 2017-01-19 NOTE — Assessment & Plan Note (Signed)
Concern for atypical bacterial infeciton.  Treat with azithromycin.

## 2017-01-19 NOTE — Patient Instructions (Signed)
Complete course of antibiotics.  Call if fever on antibiotics, shortness of breath or wheezing.

## 2017-01-19 NOTE — Progress Notes (Deleted)
No chief complaint on file.   HPI: James Braun 76 y.o.  SDA   PCP [place] ROS: See pertinent positives and negatives per HPI.  Past Medical History:  Diagnosis Date  . BPH (benign prostatic hypertrophy)   . History of kidney stones   . History of melanoma excision OCT 2010  . Hyperlipidemia   . Hypertension   . Left ureteral calculus   . Nephrolithiasis    bilateral  . Type 2 diabetes mellitus ([place])     Family History  Problem Relation Age of Onset  . Hypertension Mother   . Diabetes Father   . Hypertension Father   . Coronary artery disease Neg Hx   . Cancer Neg Hx     Social History   Social History  . Marital status: Married    Spouse name: N/A  . Number of children: 2  . Years of education: N/A   Occupational History  . 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 Buys very distressed houses and rehabilitates   . Does mediation    Social History Main Topics  . Smoking status: Never Smoker  . Smokeless tobacco: Never Used  . Alcohol use No  . Drug use: No  . Sexual activity: Not on file   Other Topics Concern  . Not on file   Social History Narrative   Has living will   Wife is health care POA---alternate is committee of physicians   Would accept resuscitation---wouldn't accept prolonged ventilation   No tube feeds if cognitively unaware    Outpatient Medications Prior to Visit  Medication Sig Dispense Refill  . aspirin 81 MG tablet Take 81 mg by mouth daily.     Marland Kitchen diltiazem (CARDIZEM CD) 240 MG 24 hr capsule Take 1 capsule (240 mg total) by mouth daily. 90 capsule 3  . finasteride (PROSCAR) 5 MG tablet Take 1 tablet (5 mg total) by mouth daily. 90 tablet 3  . metFORMIN (GLUCOPHAGE-XR) 500 MG 24 hr tablet Take 1 tablet (500 mg total) by mouth 2 (two) times daily. 180 tablet 3  . Multiple Vitamin (MULTIVITAMIN) tablet Take 1 tablet by mouth daily.    . Potassium Citrate 15 MEQ (1620 MG) TBCR Take 1 tablet by mouth 3 (three) times daily. 270 tablet 3    . Red Yeast Rice Extract (RED YEAST RICE PO) Take 1 tablet by mouth daily.     Marland Kitchen telmisartan (MICARDIS) 80 MG tablet Take 1 tablet (80 mg total) by mouth every morning. 90 tablet 3  . triamterene-hydrochlorothiazide (MAXZIDE-25) 37.5-25 MG tablet Take 1 tablet by mouth daily. 90 tablet 3   No facility-administered medications prior to visit.      EXAM:  There were no vitals taken for this visit.  There is no height or weight on file to calculate BMI.  GENERAL: vitals reviewed and listed above, alert, oriented, appears well hydrated and in no acute distress HEENT: atraumatic, conjunctiva  clear, no obvious abnormalities on inspection of external nose and ears OP : no lesion edema or exudate  NECK: no obvious masses on inspection palpation  LUNGS: clear to auscultation bilaterally, no wheezes, rales or rhonchi, good air movement CV: HRRR, no clubbing cyanosis or  peripheral edema nl cap refill  MS: moves all extremities without noticeable focal  abnormality PSYCH: pleasant and cooperative, no obvious depression or anxiety  ASSESSMENT AND PLAN:  Discussed the following assessment and plan:  No diagnosis found.  -Patient advised to return or notify health care  team  if symptoms worsen ,persist or new concerns arise.  There are no Patient Instructions on file for this visit.   Standley Brooking. Cherylann Hobday M.D.

## 2017-01-19 NOTE — Assessment & Plan Note (Signed)
Now resolved s/p vigamox drops.

## 2017-01-22 ENCOUNTER — Ambulatory Visit (INDEPENDENT_AMBULATORY_CARE_PROVIDER_SITE_OTHER): Payer: PPO | Admitting: Podiatry

## 2017-01-22 ENCOUNTER — Encounter: Payer: Self-pay | Admitting: Podiatry

## 2017-01-22 DIAGNOSIS — M79676 Pain in unspecified toe(s): Secondary | ICD-10-CM | POA: Diagnosis not present

## 2017-01-22 DIAGNOSIS — B351 Tinea unguium: Secondary | ICD-10-CM

## 2017-01-22 DIAGNOSIS — Q828 Other specified congenital malformations of skin: Secondary | ICD-10-CM

## 2017-01-22 DIAGNOSIS — E1142 Type 2 diabetes mellitus with diabetic polyneuropathy: Secondary | ICD-10-CM

## 2017-01-22 DIAGNOSIS — E1140 Type 2 diabetes mellitus with diabetic neuropathy, unspecified: Secondary | ICD-10-CM

## 2017-01-22 NOTE — Progress Notes (Signed)
Patient ID: James Braun, male   DOB: 29-Nov-1940, 76 y.o.   MRN: [id number]   Complaint:  Visit Type: Patient returns to my office for continued preventative foot care services. Complaint: Patient states" my nails have grown long and thick and become painful to walk and wear shoes" Patient has been diagnosed with DM with no preulcerative lesions and hammer toes . The patient presents for preventative foot care services. No changes to ROS.  Patient says he is doing well with his diabetic shoes.  Podiatric Exam: Vascular: dorsalis pedis and posterior tibial pulses are palpable bilateral. Capillary return is immediate. Temperature gradient is WNL. Skin turgor WNL  Sensorium: Diminished  Semmes Weinstein monofilament test. Normal tactile sensation bilaterally. Nail Exam: Pt has thick disfigured discolored nails with subungual debris noted bilateral entire nail hallux through fifth toenails Ulcer Exam: There is no evidence of ulcer or pre-ulcerative changes or infection. Orthopedic Exam: Muscle tone and strength are WNL. No limitations in general ROM. No crepitus or effusions noted. Foot type and digits show no abnormalities. Bony prominences are unremarkable. Skin:  Porokeratosis sub 1,5 and heel left  foot. No infection or ulcers  Diagnosis:  Onychomycosis, , Pain in right toe, pain in left toes. Porokeratosis  Left foot  Treatment & Plan Procedures and Treatment: Consent by patient was obtained for treatment procedures. The patient understood the discussion of treatment and procedures well. All questions were answered thoroughly reviewed. Debridement of mycotic and hypertrophic toenails, 1 through 5 bilateral and clearing of subungual debris. No ulceration, no infection noted. Debride porokeratosis left foot. Return Visit-Office Procedure: Patient instructed to return to the office for a follow up visit 3 months for continued evaluation and treatment.     Gardiner Barefoot DPM

## 2017-02-12 ENCOUNTER — Other Ambulatory Visit: Payer: Self-pay | Admitting: Internal Medicine

## 2017-02-12 DIAGNOSIS — E1142 Type 2 diabetes mellitus with diabetic polyneuropathy: Secondary | ICD-10-CM

## 2017-02-22 ENCOUNTER — Other Ambulatory Visit (INDEPENDENT_AMBULATORY_CARE_PROVIDER_SITE_OTHER): Payer: PPO

## 2017-02-22 DIAGNOSIS — E1142 Type 2 diabetes mellitus with diabetic polyneuropathy: Secondary | ICD-10-CM | POA: Diagnosis not present

## 2017-02-22 LAB — COMPREHENSIVE METABOLIC PANEL
ALBUMIN: 4.2 g/dL (ref 3.5–5.2)
ALK PHOS: 52 U/L (ref 39–117)
ALT: 18 U/L (ref 0–53)
AST: 19 U/L (ref 0–37)
BUN: 25 mg/dL — AB (ref 6–23)
CALCIUM: 8.9 mg/dL (ref 8.4–10.5)
CO2: 31 mEq/L (ref 19–32)
Chloride: 104 mEq/L (ref 96–112)
Creatinine, Ser: 1.15 mg/dL (ref 0.40–1.50)
GFR: 65.55 mL/min (ref >60.00)
Glucose, Bld: 133 mg/dL — ABNORMAL HIGH (ref 70–99)
POTASSIUM: 3 meq/L — AB (ref 3.5–5.1)
SODIUM: 142 meq/L (ref 135–145)
TOTAL PROTEIN: 6.6 g/dL (ref 6.0–8.3)
Total Bilirubin: 0.4 mg/dL (ref 0.2–1.2)

## 2017-02-22 LAB — LIPID PANEL
CHOL/HDL RATIO: 5
Cholesterol: 168 mg/dL (ref 0–200)
HDL: 33.1 mg/dL — ABNORMAL LOW (ref >39.00)
LDL Cholesterol: 108 mg/dL — ABNORMAL HIGH (ref 0–99)
NonHDL: 135.04
TRIGLYCERIDES: 135 mg/dL (ref 0.0–149.0)
VLDL: 27 mg/dL (ref 0.0–40.0)

## 2017-02-22 LAB — CBC WITH DIFFERENTIAL/PLATELET
Basophils Absolute: 0 10*3/uL (ref 0.0–0.1)
Basophils Relative: 0.8 % (ref 0.0–3.0)
EOS ABS: 0.2 10*3/uL (ref 0.0–0.7)
EOS PCT: 3.1 % (ref 0.0–5.0)
HEMATOCRIT: 43.4 % (ref 39.0–52.0)
Hemoglobin: 14.3 g/dL (ref 13.0–17.0)
LYMPHS PCT: 22.1 % (ref 12.0–46.0)
Lymphs Abs: 1.1 10*3/uL (ref 0.7–4.0)
MCHC: 33.1 g/dL (ref 30.0–36.0)
MCV: 89.8 fl (ref 78.0–100.0)
MONO ABS: 0.4 10*3/uL (ref 0.1–1.0)
Monocytes Relative: 8.9 % (ref 3.0–12.0)
NEUTROS PCT: 65.1 % (ref 43.0–77.0)
Neutro Abs: 3.2 10*3/uL (ref 1.4–7.7)
Platelets: 262 10*3/uL (ref 150.0–400.0)
RBC: 4.83 Mil/uL (ref 4.22–5.81)
RDW: 15.6 % — ABNORMAL HIGH (ref 11.5–15.5)
WBC: 4.9 10*3/uL (ref 4.0–10.5)

## 2017-02-22 LAB — T4, FREE: Free T4: 1.3 ng/dL (ref 0.60–1.60)

## 2017-02-22 LAB — HEMOGLOBIN A1C: Hgb A1c MFr Bld: 6.9 % — ABNORMAL HIGH (ref 4.6–6.5)

## 2017-03-01 ENCOUNTER — Encounter: Payer: Self-pay | Admitting: Internal Medicine

## 2017-03-01 ENCOUNTER — Ambulatory Visit (INDEPENDENT_AMBULATORY_CARE_PROVIDER_SITE_OTHER): Payer: PPO | Admitting: Internal Medicine

## 2017-03-01 VITALS — BP 136/84 | HR 70 | Temp 98.0°F | Ht 65.5 in | Wt 153.0 lb

## 2017-03-01 DIAGNOSIS — E785 Hyperlipidemia, unspecified: Secondary | ICD-10-CM

## 2017-03-01 DIAGNOSIS — E1140 Type 2 diabetes mellitus with diabetic neuropathy, unspecified: Secondary | ICD-10-CM | POA: Diagnosis not present

## 2017-03-01 DIAGNOSIS — Z7189 Other specified counseling: Secondary | ICD-10-CM | POA: Diagnosis not present

## 2017-03-01 DIAGNOSIS — Z0000 Encounter for general adult medical examination without abnormal findings: Secondary | ICD-10-CM | POA: Diagnosis not present

## 2017-03-01 DIAGNOSIS — I10 Essential (primary) hypertension: Secondary | ICD-10-CM

## 2017-03-01 DIAGNOSIS — N400 Enlarged prostate without lower urinary tract symptoms: Secondary | ICD-10-CM

## 2017-03-01 DIAGNOSIS — Z23 Encounter for immunization: Secondary | ICD-10-CM

## 2017-03-01 LAB — RENAL FUNCTION PANEL
ALBUMIN: 4.1 g/dL (ref 3.5–5.2)
BUN: 24 mg/dL — ABNORMAL HIGH (ref 6–23)
CALCIUM: 9.8 mg/dL (ref 8.4–10.5)
CHLORIDE: 101 meq/L (ref 96–112)
CO2: 30 mEq/L (ref 19–32)
Creatinine, Ser: 1.12 mg/dL (ref 0.40–1.50)
GFR: 67.57 mL/min (ref >60.00)
Glucose, Bld: 121 mg/dL — ABNORMAL HIGH (ref 70–99)
POTASSIUM: 3.7 meq/L (ref 3.5–5.1)
Phosphorus: 3.4 mg/dL (ref 2.3–4.6)
Sodium: 140 mEq/L (ref 135–145)

## 2017-03-01 LAB — HM DIABETES FOOT EXAM

## 2017-03-01 NOTE — Assessment & Plan Note (Signed)
No major problems with feet lately--keeps up with podiatrist  Lab Results  Component Value Date   HGBA1C 6.9 (H) 02/22/2017   Good control

## 2017-03-01 NOTE — Assessment & Plan Note (Signed)
I have personally reviewed the Medicare Annual Wellness questionnaire and have noted 1. The patient's medical and social history 2. Their use of alcohol, tobacco or illicit drugs 3. Their current medications and supplements 4. The patient's functional ability including ADL's, fall risks, home safety risks and hearing or visual             impairment. 5. Diet and physical activities 6. Evidence for depression or mood disorders  The patients weight, height, BMI and visual acuity have been recorded in the chart I have made referrals, counseling and provided education to the patient based review of the above and I have provided the pt with a written personalized care plan for preventive services.  I have provided you with a copy of your personalized plan for preventive services. Please take the time to review along with your updated medication list.  He wishes to continue PSA screening per his urologist Will reconsider colon in 2020 Stays fit Yearly flu vaccine

## 2017-03-01 NOTE — Assessment & Plan Note (Signed)
Good control just with red yeast rice

## 2017-03-01 NOTE — Addendum Note (Signed)
Addended by: Pilar Grammes on: 03/01/2017 03:49 PM   Modules accepted: Orders

## 2017-03-01 NOTE — Progress Notes (Signed)
Subjective:    Patient ID: James Braun, male    DOB: 06/26/1941, 76 y.o.   MRN: [id number]  HPI Here for Medicare wellness visit and follow up of chronic medical conditions Reviewed form and advanced directives Reviewed list of other doctors No alcohol or tobacco Tries to exercise regularly No falls No depression or anhedonia Vision is okay--on 2 drops for right eye (glaucoma suspect) Hearing is fairly good Independent with instrumental ADLs Memory is fine  Doing well Doesn't check sugars regularly No hypoglycemic reactions No sores, numbness or pain in feet Using great insoles from podiatrist  Continues with urologist He requests the PSA every other time Voids okay Rare nocturia Still on med Potassium citrate still for kidney stone prevention (misses doses at times)  Red yeast rice still for cholesterol No problems with this  No chest pain No SOB No dizziness or syncope No edema  Current Outpatient Prescriptions on File Prior to Visit  Medication Sig Dispense Refill  . aspirin 81 MG tablet Take 81 mg by mouth daily.     Marland Kitchen diltiazem (CARDIZEM CD) 240 MG 24 hr capsule Take 1 capsule (240 mg total) by mouth daily. 90 capsule 3  . finasteride (PROSCAR) 5 MG tablet Take 1 tablet (5 mg total) by mouth daily. 90 tablet 3  . metFORMIN (GLUCOPHAGE-XR) 500 MG 24 hr tablet Take 1 tablet (500 mg total) by mouth 2 (two) times daily. 180 tablet 3  . Multiple Vitamin (MULTIVITAMIN) tablet Take 1 tablet by mouth daily.    . Potassium Citrate 15 MEQ (1620 MG) TBCR Take 1 tablet by mouth 3 (three) times daily. 270 tablet 3  . Red Yeast Rice Extract (RED YEAST RICE PO) Take 1 tablet by mouth daily.     Marland Kitchen telmisartan (MICARDIS) 80 MG tablet Take 1 tablet (80 mg total) by mouth every morning. 90 tablet 3  . triamterene-hydrochlorothiazide (MAXZIDE-25) 37.5-25 MG tablet Take 1 tablet by mouth daily. 90 tablet 3   No current facility-administered medications on file prior to visit.       Allergies  Allergen Reactions  . Atorvastatin Other (See Comments)    REACTION: myalgias  . Morphine And Related Other (See Comments)     hypotension  . Levofloxacin Rash  . Septra [Sulfamethoxazole-Trimethoprim] Rash  . Sulfamethoxazole Rash    Past Medical History:  Diagnosis Date  . BPH (benign prostatic hypertrophy)   . History of kidney stones   . History of melanoma excision OCT 2010  . Hyperlipidemia   . Hypertension   . Left ureteral calculus   . Nephrolithiasis    bilateral  . Type 2 diabetes mellitus ([place])     Past Surgical History:  Procedure Laterality Date  . CHOLECYSTECTOMY  july 2000  . CYSTOSCOPY WITH STENT PLACEMENT Left 11/14/2012   Procedure: CYSTOSCOPY WITH STENT PLACEMENT;  Surgeon: [name], MD;  Location[facility];  Service: Urology;  Laterality: Left;  . CYSTOSCOPY/RETROGRADE/URETEROSCOPY/STONE EXTRACTION WITH BASKET Left 11/14/2012   Procedure: CYSTOSCOPY/RETROGRADE/URETEROSCOPY/STONE EXTRACTION WITH BASKET;  Surgeon: [name], MD;  Location: [facility];  Service: Urology;  Laterality: Left;  . EXTRACORPOREAL SHOCK WAVE LITHOTRIPSY  JULY 2000   X2  . HOLMIUM LASER APPLICATION Left 12/21/2092   Procedure: HOLMIUM LASER APPLICATION;  Surgeon: [name], MD;  Location: [facility];  Service: Urology;  Laterality: Left;  . LEFT URETEROSCOPIC STONE EXTRACTION  02-22-2007  . NEPHROLITHOTOMY Right 09-28-2010   percutaneous  . ORIF CLAVICLE FRACTURE  2006  . [place]   neg  . SHOULDER ARTHROSCOPY  1/15   Dr [name]  . TONSILLECTOMY      Family History  Problem Relation Age of Onset  . Hypertension Mother   . Diabetes Father   . Hypertension Father   . Coronary artery disease Neg Hx   . Cancer Neg Hx     Social History   Social History  . Marital status: Married    Spouse name: N/A  . Number of children: 2  . Years of education: N/A   Occupational  History  . 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 Buys very distressed houses and rehabilitates   . Does mediation    Social History Main Topics  . Smoking status: Never Smoker  . Smokeless tobacco: Never Used  . Alcohol use No  . Drug use: No  . Sexual activity: Not on file   Other Topics Concern  . Not on file   Social History Narrative   Has living will   Wife is health care POA---alternate is committee of physicians   Would accept resuscitation---wouldn't accept prolonged ventilation   No tube feeds if cognitively unaware   Review of Systems Sleeps well Appetite is fine--fasts 2 days per week (600 calories) Weight stable Wears seat belt Teeth okay---keeps up with dentist Bowels are fine-- no blood. Prunes and probiotic help. No rash or suspicious lesions.---going for derm visit soon No sig back or joint pain No heartburn or dysphagia    Objective:   Physical Exam  Constitutional: He is oriented to person, place, and time. He appears well-nourished. No distress.  HENT:  Mouth/Throat: Oropharynx is clear and moist. No oropharyngeal exudate.  Neck: No thyromegaly present.  Cardiovascular: Normal rate, regular rhythm, normal heart sounds and intact distal pulses.  Exam reveals no gallop.   No murmur heard. Pulmonary/Chest: Effort normal and breath sounds normal. No respiratory distress. He has no wheezes. He has no rales.  Abdominal: Soft. There is no tenderness.  Musculoskeletal: He exhibits no edema or tenderness.  Lymphadenopathy:    He has no cervical adenopathy.  Neurological: He is alert and oriented to person, place, and time.  President--- "Daisy Floro, Obama, Bush" [phone] [id number] Recall 3/3  Normal sensation in feet  Skin: No rash noted. No erythema.  Mild left foot callous  Psychiatric: He has a normal mood and affect. His behavior is normal.          Assessment & Plan:

## 2017-03-01 NOTE — Assessment & Plan Note (Signed)
See social history 

## 2017-03-01 NOTE — Assessment & Plan Note (Signed)
Okay on finasteride

## 2017-03-01 NOTE — Assessment & Plan Note (Signed)
BP Readings from Last 3 Encounters:  03/01/17 136/84  01/19/17 120/76  08/30/16 126/88   Good control

## 2017-03-05 DIAGNOSIS — Z85820 Personal history of malignant melanoma of skin: Secondary | ICD-10-CM | POA: Diagnosis not present

## 2017-03-05 DIAGNOSIS — Z1283 Encounter for screening for malignant neoplasm of skin: Secondary | ICD-10-CM | POA: Diagnosis not present

## 2017-03-05 DIAGNOSIS — Z08 Encounter for follow-up examination after completed treatment for malignant neoplasm: Secondary | ICD-10-CM | POA: Diagnosis not present

## 2017-03-25 ENCOUNTER — Encounter: Payer: Self-pay | Admitting: Internal Medicine

## 2017-03-26 ENCOUNTER — Ambulatory Visit (INDEPENDENT_AMBULATORY_CARE_PROVIDER_SITE_OTHER): Payer: PPO | Admitting: Podiatry

## 2017-03-26 ENCOUNTER — Encounter: Payer: Self-pay | Admitting: Podiatry

## 2017-03-26 DIAGNOSIS — E1140 Type 2 diabetes mellitus with diabetic neuropathy, unspecified: Secondary | ICD-10-CM | POA: Diagnosis not present

## 2017-03-26 DIAGNOSIS — Q828 Other specified congenital malformations of skin: Secondary | ICD-10-CM | POA: Diagnosis not present

## 2017-03-26 DIAGNOSIS — B351 Tinea unguium: Secondary | ICD-10-CM

## 2017-03-26 DIAGNOSIS — M79676 Pain in unspecified toe(s): Secondary | ICD-10-CM

## 2017-03-26 NOTE — Progress Notes (Signed)
Patient ID: James Braun, male   DOB: 01/26/1941, 76 y.o.   MRN: [id number]   Complaint:  Visit Type: Patient returns to my office for continued preventative foot care services. Complaint: Patient states" my nails have grown long and thick and become painful to walk and wear shoes" Patient has been diagnosed with DM with no preulcerative lesions and hammer toes . The patient presents for preventative foot care services. No changes to ROS.   Podiatric Exam: Vascular: dorsalis pedis and posterior tibial pulses are palpable bilateral. Capillary return is immediate. Temperature gradient is WNL. Skin turgor WNL  Sensorium: Diminished  Semmes Weinstein monofilament test. Normal tactile sensation bilaterally. Nail Exam: Pt has thick disfigured discolored nails with subungual debris noted bilateral entire nail hallux through fifth toenails Ulcer Exam: There is no evidence of ulcer or pre-ulcerative changes or infection. Orthopedic Exam: Muscle tone and strength are WNL. No limitations in general ROM. No crepitus or effusions noted. Foot type and digits show no abnormalities. Bony prominences are unremarkable. Skin:  Porokeratosis sub 1,5 and heel left  foot. No infection or ulcers  Diagnosis:  Onychomycosis, , Pain in right toe, pain in left toes. Porokeratosis  Left foot  Treatment & Plan Procedures and Treatment: Consent by patient was obtained for treatment procedures. The patient understood the discussion of treatment and procedures well. All questions were answered thoroughly reviewed. Debridement of mycotic and hypertrophic toenails, 1 through 5 bilateral and clearing of subungual debris. No ulceration, no infection noted. Debride porokeratosis left foot. Return Visit-Office Procedure: Patient instructed to return to the office for a follow up visit 3 months for continued evaluation and treatment.     Gardiner Barefoot DPM

## 2017-04-02 NOTE — Telephone Encounter (Signed)
Please send him the FIT testing kit

## 2017-04-13 ENCOUNTER — Other Ambulatory Visit (INDEPENDENT_AMBULATORY_CARE_PROVIDER_SITE_OTHER): Payer: PPO

## 2017-04-13 ENCOUNTER — Other Ambulatory Visit: Payer: Self-pay | Admitting: Internal Medicine

## 2017-04-13 DIAGNOSIS — Z1211 Encounter for screening for malignant neoplasm of colon: Secondary | ICD-10-CM

## 2017-04-13 LAB — FECAL OCCULT BLOOD, GUAIAC: Fecal Occult Blood: NEGATIVE

## 2017-04-13 LAB — FECAL OCCULT BLOOD, IMMUNOCHEMICAL: Fecal Occult Bld: NEGATIVE

## 2017-04-25 ENCOUNTER — Encounter: Payer: Self-pay | Admitting: Internal Medicine

## 2017-04-26 ENCOUNTER — Encounter: Payer: Self-pay | Admitting: Internal Medicine

## 2017-04-26 NOTE — Telephone Encounter (Signed)
James Braun, can you help [place] with this?

## 2017-04-26 NOTE — Telephone Encounter (Signed)
I can postpone his flu vaccine to the end of November. I looked in his last CPE note in August and it is not mentioned about the colonoscopy. It is set up to repeat every 3 years. Does that need to be fixed?

## 2017-04-26 NOTE — Telephone Encounter (Signed)
Please take care of this for him. Let Junie Panning know to inform appropriate folks-- he should not trigger colon screening when FIT is appropriately in chart.  Let him know

## 2017-05-17 ENCOUNTER — Ambulatory Visit (INDEPENDENT_AMBULATORY_CARE_PROVIDER_SITE_OTHER): Payer: PPO

## 2017-05-17 DIAGNOSIS — Z23 Encounter for immunization: Secondary | ICD-10-CM | POA: Diagnosis not present

## 2017-06-04 DIAGNOSIS — H1851 Endothelial corneal dystrophy: Secondary | ICD-10-CM | POA: Diagnosis not present

## 2017-06-18 DIAGNOSIS — N402 Nodular prostate without lower urinary tract symptoms: Secondary | ICD-10-CM | POA: Diagnosis not present

## 2017-06-18 DIAGNOSIS — N200 Calculus of kidney: Secondary | ICD-10-CM | POA: Diagnosis not present

## 2017-06-25 ENCOUNTER — Ambulatory Visit: Payer: PPO | Admitting: Podiatry

## 2017-06-28 ENCOUNTER — Encounter: Payer: Self-pay | Admitting: Podiatry

## 2017-06-28 ENCOUNTER — Ambulatory Visit: Payer: PPO | Admitting: Podiatry

## 2017-06-28 DIAGNOSIS — Q828 Other specified congenital malformations of skin: Secondary | ICD-10-CM

## 2017-06-28 DIAGNOSIS — E1142 Type 2 diabetes mellitus with diabetic polyneuropathy: Secondary | ICD-10-CM | POA: Diagnosis not present

## 2017-06-28 DIAGNOSIS — E1140 Type 2 diabetes mellitus with diabetic neuropathy, unspecified: Secondary | ICD-10-CM | POA: Diagnosis not present

## 2017-06-28 DIAGNOSIS — B351 Tinea unguium: Secondary | ICD-10-CM | POA: Diagnosis not present

## 2017-06-28 DIAGNOSIS — M79676 Pain in unspecified toe(s): Secondary | ICD-10-CM

## 2017-06-28 NOTE — Progress Notes (Signed)
Patient ID: James Braun, male   DOB: [birth year], 76 y.o.   MRN: [id number]   Complaint:  Visit Type: Patient returns to my office for continued preventative foot care services. Complaint: Patient states" my nails have grown long and thick and become painful to walk and wear shoes" Patient has been diagnosed with DM with no preulcerative lesions and hammer toes . The patient presents for preventative foot care services. No changes to ROS.   Podiatric Exam: Vascular: dorsalis pedis and posterior tibial pulses are palpable bilateral. Capillary return is immediate. Temperature gradient is WNL. Skin turgor WNL  Sensorium: Diminished  Semmes Weinstein monofilament test. Normal tactile sensation bilaterally. Nail Exam: Pt has thick disfigured discolored nails with subungual debris noted bilateral entire nail hallux through fifth toenails Ulcer Exam: There is no evidence of ulcer or pre-ulcerative changes or infection. Orthopedic Exam: Muscle tone and strength are WNL. No limitations in general ROM. No crepitus or effusions noted. Foot type and digits show no abnormalities. Bony prominences are unremarkable. Skin:  Porokeratosis sub 1,5 and heel left  foot. No infection or ulcers  Diagnosis:  Onychomycosis, , Pain in right toe, pain in left toes. Porokeratosis  Left foot  Treatment & Plan Procedures and Treatment: Consent by patient was obtained for treatment procedures. The patient understood the discussion of treatment and procedures well. All questions were answered thoroughly reviewed. Debridement of mycotic and hypertrophic toenails, 1 through 5 bilateral and clearing of subungual debris. No ulceration, no infection noted. Debride porokeratosis left foot. Return Visit-Office Procedure: Patient instructed to return to the office for a follow up visit 3 months for continued evaluation and treatment.     Gardiner Barefoot DPM

## 2017-07-13 DIAGNOSIS — L820 Inflamed seborrheic keratosis: Secondary | ICD-10-CM | POA: Diagnosis not present

## 2017-07-13 DIAGNOSIS — L570 Actinic keratosis: Secondary | ICD-10-CM | POA: Diagnosis not present

## 2017-07-13 DIAGNOSIS — X32XXXD Exposure to sunlight, subsequent encounter: Secondary | ICD-10-CM | POA: Diagnosis not present

## 2017-08-22 ENCOUNTER — Telehealth: Payer: Self-pay | Admitting: Internal Medicine

## 2017-08-22 MED ORDER — METFORMIN HCL ER 500 MG PO TB24: 500.0000 mg | ORAL_TABLET | Freq: Two times a day (BID) | ORAL | 3 refills | Status: DC

## 2017-08-22 MED ORDER — DILTIAZEM HCL ER COATED BEADS 240 MG PO CP24: 240.0000 mg | ORAL_CAPSULE | Freq: Every day | ORAL | 3 refills | Status: DC

## 2017-08-22 MED ORDER — FINASTERIDE 5 MG PO TABS: 5.0000 mg | ORAL_TABLET | Freq: Every day | ORAL | 3 refills | Status: DC

## 2017-08-22 MED ORDER — TELMISARTAN 80 MG PO TABS: 80.0000 mg | ORAL_TABLET | Freq: Every morning | ORAL | 3 refills | Status: DC

## 2017-08-22 MED ORDER — TRIAMTERENE-HCTZ 37.5-25 MG PO TABS: 1.0000 | ORAL_TABLET | Freq: Every day | ORAL | 3 refills | Status: DC

## 2017-08-22 NOTE — Telephone Encounter (Signed)
Copied from [place] [id number]. Topic: Quick Communication - Rx Refill/Question >> Aug 22, 2017 10:35 AM Celedonio Savage L wrote: Medication: diltiazem (CARDIZEM CD) 240 MG 24 hr capsule 90 day refill    finasteride (PROSCAR) 5 MG tablet 90 day refill  metFORMIN (GLUCOPHAGE-XR) 500 MG 24 hr tablet  180 tablets telmisartan (MICARDIS) 80 MG tablet  90 day refill   triamterene-hydrochlorothiazide (MAXZIDE-25) 37.5-25 MG tablet  90 day refill        Has the patient contacted their pharmacy? Yes.  The pharmacy has been trying to get in touch with the office since the middle of January    (Agent: If no, request that the patient contact the pharmacy for the refill.)   Preferred Pharmacy (with phone number or street name): [facility] [place], [place] [phone] (Phone) [phone] (Fax)     Agent: Please be advised that RX refills may take up to 3 business days. We ask that you follow-up with your pharmacy.

## 2017-08-23 ENCOUNTER — Other Ambulatory Visit: Payer: Self-pay | Admitting: Internal Medicine

## 2017-08-23 DIAGNOSIS — E1140 Type 2 diabetes mellitus with diabetic neuropathy, unspecified: Secondary | ICD-10-CM

## 2017-08-27 ENCOUNTER — Other Ambulatory Visit (INDEPENDENT_AMBULATORY_CARE_PROVIDER_SITE_OTHER): Payer: PPO

## 2017-08-27 DIAGNOSIS — E1140 Type 2 diabetes mellitus with diabetic neuropathy, unspecified: Secondary | ICD-10-CM | POA: Diagnosis not present

## 2017-08-27 LAB — HEMOGLOBIN A1C: HEMOGLOBIN A1C: 6.8 % — AB (ref 4.6–6.5)

## 2017-08-31 ENCOUNTER — Ambulatory Visit (INDEPENDENT_AMBULATORY_CARE_PROVIDER_SITE_OTHER): Payer: PPO | Admitting: Internal Medicine

## 2017-08-31 ENCOUNTER — Encounter: Payer: Self-pay | Admitting: Internal Medicine

## 2017-08-31 VITALS — BP 130/84 | HR 88 | Temp 97.9°F | Wt 160.0 lb

## 2017-08-31 DIAGNOSIS — E1140 Type 2 diabetes mellitus with diabetic neuropathy, unspecified: Secondary | ICD-10-CM

## 2017-08-31 NOTE — Progress Notes (Signed)
Subjective:    Patient ID: James Braun, male    DOB: Jan 26, 1941, 77 y.o.   MRN: [id number]  HPI Here for follow up of diabetes  Doing well His only problem is drying up of semen Gradually decreasing volume of ejaculate Erection is okay and reaches climax (still has some sildenafil)  Checks sugars rarely No hypoglycemic reactions No foot pain or bothersome sensory changes  No chest pain No SOB No dizziness or syncope  Current Outpatient Medications on File Prior to Visit  Medication Sig Dispense Refill  . aspirin 81 MG tablet Take 81 mg by mouth daily.     Marland Kitchen diltiazem (CARDIZEM CD) 240 MG 24 hr capsule Take 1 capsule (240 mg total) by mouth daily. 90 capsule 3  . finasteride (PROSCAR) 5 MG tablet Take 1 tablet (5 mg total) by mouth daily. 90 tablet 3  . metFORMIN (GLUCOPHAGE-XR) 500 MG 24 hr tablet Take 1 tablet (500 mg total) by mouth 2 (two) times daily. 180 tablet 3  . Multiple Vitamin (MULTIVITAMIN) tablet Take 1 tablet by mouth daily.    . Potassium Citrate 15 MEQ (1620 MG) TBCR Take 1 tablet by mouth 3 (three) times daily. 270 tablet 3  . Red Yeast Rice Extract (RED YEAST RICE PO) Take 1 tablet by mouth daily.     Marland Kitchen telmisartan (MICARDIS) 80 MG tablet Take 1 tablet (80 mg total) by mouth every morning. 90 tablet 3  . triamterene-hydrochlorothiazide (MAXZIDE-25) 37.5-25 MG tablet Take 1 tablet by mouth daily. 90 tablet 3   No current facility-administered medications on file prior to visit.     Allergies  Allergen Reactions  . Atorvastatin Other (See Comments)    REACTION: myalgias  . Morphine And Related Other (See Comments)     hypotension  . Levofloxacin Rash  . Septra [Sulfamethoxazole-Trimethoprim] Rash  . Sulfamethoxazole Rash    Past Medical History:  Diagnosis Date  . BPH (benign prostatic hypertrophy)   . History of kidney stones   . History of melanoma excision OCT 2010  . Hyperlipidemia   . Hypertension   . Left ureteral calculus   .  Nephrolithiasis    bilateral  . Type 2 diabetes mellitus ([place])     Past Surgical History:  Procedure Laterality Date  . CHOLECYSTECTOMY  july 2000  . CYSTOSCOPY WITH STENT PLACEMENT Left 11/14/2012   Procedure: CYSTOSCOPY WITH STENT PLACEMENT;  Surgeon: [name], MD;  Location: [facility];  Service: Urology;  Laterality: Left;  . CYSTOSCOPY/RETROGRADE/URETEROSCOPY/STONE EXTRACTION WITH BASKET Left 11/14/2012   Procedure: CYSTOSCOPY/RETROGRADE/URETEROSCOPY/STONE EXTRACTION WITH BASKET;  Surgeon: [name], MD;  Location: [facility];  Service: Urology;  Laterality: Left;  . EXTRACORPOREAL SHOCK WAVE LITHOTRIPSY  JULY 2000   X2  . HOLMIUM LASER APPLICATION Left 03/21/6386   Procedure: HOLMIUM LASER APPLICATION;  Surgeon: [name], MD;  Location: [facility];  Service: Urology;  Laterality: Left;  . LEFT URETEROSCOPIC STONE EXTRACTION  02-22-2007  . NEPHROLITHOTOMY Right 09-28-2010   percutaneous  . ORIF CLAVICLE FRACTURE  2006  . PROSTATE BIOPSY  1998   neg  . SHOULDER ARTHROSCOPY  1/15   Dr [name]  . TONSILLECTOMY      Family History  Problem Relation Age of Onset  . Hypertension Mother   . Diabetes Father   . Hypertension Father   . Coronary artery disease Neg Hx   . Cancer Neg Hx     Social History   Socioeconomic History  .  Marital status: Married    Spouse name: Not on file  . Number of children: 2  . Years of education: Not on file  . Highest education level: Not on file  Social Needs  . Financial resource strain: Not on file  . Food insecurity - worry: Not on file  . Food insecurity - inability: Not on file  . Transportation needs - medical: Not on file  . Transportation needs - non-medical: Not on file  Occupational History  . 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 Occupation: Buys very distressed houses and rehabilitates  . Occupation: Does mediation  Tobacco Use  . Smoking status:  Never Smoker  . Smokeless tobacco: Never Used  Substance and Sexual Activity  . Alcohol use: No  . Drug use: No  . Sexual activity: Not on file  Other Topics Concern  . Not on file  Social History Narrative   Has living will   Wife is health care POA---alternate is committee of physicians   Would accept resuscitation---wouldn't accept prolonged ventilation   No tube feeds if cognitively unaware   Review of Systems  Goes to gym regularly Had some nasal drainage--- may be some better     Objective:   Physical Exam  Constitutional: He appears well-developed. No distress.  Neck: No thyromegaly present.  Cardiovascular: Normal rate, regular rhythm, normal heart sounds and intact distal pulses. Exam reveals no gallop.  No murmur heard. Pulmonary/Chest: Effort normal. No respiratory distress. He has no wheezes. He has no rales.  Musculoskeletal: He exhibits no edema or tenderness.  Lymphadenopathy:    He has no cervical adenopathy.  Skin:  No foot lesions  Psychiatric: He has a normal mood and affect. His behavior is normal.          Assessment & Plan:

## 2017-08-31 NOTE — Assessment & Plan Note (Signed)
Good control Lab Results  Component Value Date   HGBA1C 6.8 (H) 08/27/2017   No changes needed Minimal sensory changes

## 2017-09-03 ENCOUNTER — Ambulatory Visit: Payer: PPO | Admitting: Internal Medicine

## 2017-09-13 ENCOUNTER — Ambulatory Visit (INDEPENDENT_AMBULATORY_CARE_PROVIDER_SITE_OTHER): Payer: PPO | Admitting: Podiatry

## 2017-09-13 ENCOUNTER — Encounter: Payer: Self-pay | Admitting: Podiatry

## 2017-09-13 DIAGNOSIS — B351 Tinea unguium: Secondary | ICD-10-CM

## 2017-09-13 DIAGNOSIS — E1140 Type 2 diabetes mellitus with diabetic neuropathy, unspecified: Secondary | ICD-10-CM

## 2017-09-13 DIAGNOSIS — Q828 Other specified congenital malformations of skin: Secondary | ICD-10-CM | POA: Diagnosis not present

## 2017-09-13 DIAGNOSIS — M79676 Pain in unspecified toe(s): Secondary | ICD-10-CM

## 2017-09-13 DIAGNOSIS — M2041 Other hammer toe(s) (acquired), right foot: Secondary | ICD-10-CM

## 2017-09-13 DIAGNOSIS — M2042 Other hammer toe(s) (acquired), left foot: Secondary | ICD-10-CM

## 2017-09-13 NOTE — Progress Notes (Signed)
Patient ID: James Braun, male   DOB: 07-23-1940, 77 y.o.   MRN: [id number]   Complaint:  Visit Type: Patient returns to my office for continued preventative foot care services. Complaint: Patient states" my nails have grown long and thick and become painful to walk and wear shoes" Patient has been diagnosed with DM with no preulcerative lesions and hammer toes . The patient presents for preventative foot care services. No changes to ROS.   Podiatric Exam: Vascular: dorsalis pedis and posterior tibial pulses are palpable bilateral. Capillary return is immediate. Temperature gradient is WNL. Skin turgor WNL  Sensorium: Diminished  Semmes Weinstein monofilament test. Normal tactile sensation bilaterally. Nail Exam: Pt has thick disfigured discolored nails with subungual debris noted bilateral entire nail hallux through fifth toenails Ulcer Exam: There is no evidence of ulcer or pre-ulcerative changes or infection. Orthopedic Exam: Muscle tone and strength are WNL. No limitations in general ROM. No crepitus or effusions noted. Foot type and digits show no abnormalities.Hammer toes  B/L Skin:  Porokeratosis sub 1,5 and heel left  foot. No infection or ulcers  Diagnosis:  Onychomycosis, , Pain in right toe, pain in left toes. Porokeratosis  Left foot  Treatment & Plan Procedures and Treatment: Consent by patient was obtained for treatment procedures. The patient understood the discussion of treatment and procedures well. All questions were answered thoroughly reviewed. Debridement of mycotic and hypertrophic toenails, 1 through 5 bilateral and clearing of subungual debris. No ulceration, no infection noted. Debride porokeratosis left foot. Patient is to see [facility] for evaluation for diabetic shoes for DPN,  and  hammer toes Return Visit-Office Procedure: Patient instructed to return to the office for a follow up visit 3 months for continued evaluation and treatment.     Gardiner Barefoot DPM

## 2017-09-25 ENCOUNTER — Other Ambulatory Visit: Payer: PPO

## 2017-10-16 ENCOUNTER — Ambulatory Visit: Payer: PPO | Admitting: Orthotics

## 2017-10-16 DIAGNOSIS — M2041 Other hammer toe(s) (acquired), right foot: Secondary | ICD-10-CM | POA: Diagnosis not present

## 2017-10-16 DIAGNOSIS — E1140 Type 2 diabetes mellitus with diabetic neuropathy, unspecified: Secondary | ICD-10-CM | POA: Diagnosis not present

## 2017-10-16 DIAGNOSIS — M2042 Other hammer toe(s) (acquired), left foot: Secondary | ICD-10-CM | POA: Diagnosis not present

## 2017-11-24 ENCOUNTER — Other Ambulatory Visit: Payer: Self-pay | Admitting: Internal Medicine

## 2017-12-03 DIAGNOSIS — H40003 Preglaucoma, unspecified, bilateral: Secondary | ICD-10-CM | POA: Diagnosis not present

## 2017-12-13 ENCOUNTER — Encounter: Payer: Self-pay | Admitting: Podiatry

## 2017-12-13 ENCOUNTER — Ambulatory Visit: Payer: PPO | Admitting: Podiatry

## 2017-12-13 DIAGNOSIS — M79676 Pain in unspecified toe(s): Secondary | ICD-10-CM | POA: Diagnosis not present

## 2017-12-13 DIAGNOSIS — M2042 Other hammer toe(s) (acquired), left foot: Secondary | ICD-10-CM

## 2017-12-13 DIAGNOSIS — E1140 Type 2 diabetes mellitus with diabetic neuropathy, unspecified: Secondary | ICD-10-CM

## 2017-12-13 DIAGNOSIS — Q828 Other specified congenital malformations of skin: Secondary | ICD-10-CM | POA: Diagnosis not present

## 2017-12-13 DIAGNOSIS — B351 Tinea unguium: Secondary | ICD-10-CM

## 2017-12-13 DIAGNOSIS — M2041 Other hammer toe(s) (acquired), right foot: Secondary | ICD-10-CM

## 2017-12-13 NOTE — Progress Notes (Signed)
Patient ID: James Braun, male   DOB: 1941-02-01, 77 y.o.   MRN: [id number]   Complaint:  Visit Type: Patient returns to my office for continued preventative foot care services. Complaint: Patient states" my nails have grown long and thick and become painful to walk and wear shoes" Patient has been diagnosed with DM with no preulcerative lesions and hammer toes . The patient presents for preventative foot care services. No changes to ROS.  Patient likes his new diabetic shoes.  Podiatric Exam: Vascular: dorsalis pedis and posterior tibial pulses are palpable bilateral. Capillary return is immediate. Temperature gradient is WNL. Skin turgor WNL  Sensorium: Diminished  Semmes Weinstein monofilament test. Normal tactile sensation bilaterally. Nail Exam: Pt has thick disfigured discolored nails with subungual debris noted bilateral entire nail hallux through fifth toenails Ulcer Exam: There is no evidence of ulcer or pre-ulcerative changes or infection. Orthopedic Exam: Muscle tone and strength are WNL. No limitations in general ROM. No crepitus or effusions noted. Foot type and digits show no abnormalities.Hammer toes  B/L Skin:  Porokeratosis sub ,5 B/L  and heel left  foot. No infection or ulcers  Diagnosis:  Onychomycosis, , Pain in right toe, pain in left toes. Porokeratosis  Left foot  Treatment & Plan Procedures and Treatment: Consent by patient was obtained for treatment procedures. The patient understood the discussion of treatment and procedures well. All questions were answered thoroughly reviewed. Debridement of mycotic and hypertrophic toenails, 1 through 5 bilateral and clearing of subungual debris. No ulceration, no infection noted. Debride porokeratosis B/L.  Return Visit-Office Procedure: Patient instructed to return to the office for a follow up visit 3 months for continued evaluation and treatment.     Gardiner Barefoot DPM

## 2017-12-21 ENCOUNTER — Telehealth: Payer: Self-pay

## 2017-12-21 ENCOUNTER — Encounter: Payer: Self-pay | Admitting: Internal Medicine

## 2017-12-21 ENCOUNTER — Ambulatory Visit (INDEPENDENT_AMBULATORY_CARE_PROVIDER_SITE_OTHER): Payer: PPO | Admitting: Internal Medicine

## 2017-12-21 VITALS — BP 138/70 | HR 74 | Ht 67.0 in | Wt 157.0 lb

## 2017-12-21 DIAGNOSIS — R079 Chest pain, unspecified: Secondary | ICD-10-CM | POA: Diagnosis not present

## 2017-12-21 NOTE — Telephone Encounter (Signed)
See OV note.  

## 2017-12-21 NOTE — Progress Notes (Signed)
Subjective:    Patient ID: James Braun, male    DOB: 1940-12-11, 77 y.o.   MRN: [id number]  HPI  Here due to chest pain  9-10 days ago--got bad news from his grandson (marital problem) Got chest tightness which last 1-2 hours and went away Some symptoms recurred about 4 days ago Seems to be mildly persistent since then---comes and goes Some sense of SOB---like he can't get a full breath No palpitations No dizziness or syncope No edema  Not related to eating No apparent difference with activity He relates that he feels it is anxiety related  Has not tried anything  Current Outpatient Medications on File Prior to Visit  Medication Sig Dispense Refill  . aspirin 81 MG tablet Take 81 mg by mouth daily.     Marland Kitchen diltiazem (CARDIZEM CD) 240 MG 24 hr capsule Take 1 capsule (240 mg total) by mouth daily. 90 capsule 3  . finasteride (PROSCAR) 5 MG tablet Take 1 tablet (5 mg total) by mouth daily. 90 tablet 3  . metFORMIN (GLUCOPHAGE-XR) 500 MG 24 hr tablet Take 1 tablet (500 mg total) by mouth 2 (two) times daily. 180 tablet 3  . Multiple Vitamin (MULTIVITAMIN) tablet Take 1 tablet by mouth daily.    . Potassium Citrate 15 MEQ (1620 MG) TBCR TAKE 1 TABLET BY MOUTH 3 TIMES A DAY 270 tablet 1  . Red Yeast Rice Extract (RED YEAST RICE PO) Take 1 tablet by mouth daily.     Marland Kitchen telmisartan (MICARDIS) 80 MG tablet Take 1 tablet (80 mg total) by mouth every morning. 90 tablet 3  . triamterene-hydrochlorothiazide (MAXZIDE-25) 37.5-25 MG tablet Take 1 tablet by mouth daily. 90 tablet 3   No current facility-administered medications on file prior to visit.     Allergies  Allergen Reactions  . Atorvastatin Other (See Comments)    REACTION: myalgias  . Morphine And Related Other (See Comments)     hypotension  . Levofloxacin Rash  . Septra [Sulfamethoxazole-Trimethoprim] Rash  . Sulfamethoxazole Rash    Past Medical History:  Diagnosis Date  . BPH (benign prostatic hypertrophy)   .  History of kidney stones   . History of melanoma excision OCT 2010  . Hyperlipidemia   . Hypertension   . Left ureteral calculus   . Nephrolithiasis    bilateral  . Type 2 diabetes mellitus ([place])     Past Surgical History:  Procedure Laterality Date  . CHOLECYSTECTOMY  july 2000  . CYSTOSCOPY WITH STENT PLACEMENT Left 11/14/2012   Procedure: CYSTOSCOPY WITH STENT PLACEMENT;  Surgeon: [name], MD;  Location: [facility];  Service: Urology;  Laterality: Left;  . CYSTOSCOPY/RETROGRADE/URETEROSCOPY/STONE EXTRACTION WITH BASKET Left 11/14/2012   Procedure: CYSTOSCOPY/RETROGRADE/URETEROSCOPY/STONE EXTRACTION WITH BASKET;  Surgeon: [name], MD;  Location: [facility];  Service: Urology;  Laterality: Left;  . EXTRACORPOREAL SHOCK WAVE LITHOTRIPSY  JULY 2000   X2  . HOLMIUM LASER APPLICATION Left 07/18/8784   Procedure: HOLMIUM LASER APPLICATION;  Surgeon: [name], MD;  Location[facility];  Service: Urology;  Laterality: Left;  . LEFT URETEROSCOPIC STONE EXTRACTION  02-22-2007  . NEPHROLITHOTOMY Right 09-28-2010   percutaneous  . ORIF CLAVICLE FRACTURE  2006  . PROSTATE BIOPSY  1998   neg  . SHOULDER ARTHROSCOPY  1/15   Dr [name]  . TONSILLECTOMY      Family History  Problem Relation Age of Onset  . Hypertension Mother   . Diabetes Father   .  Hypertension Father   . Coronary artery disease Neg Hx   . Cancer Neg Hx     Social History   Socioeconomic History  . Marital status: Married    Spouse name: Not on file  . Number of children: 2  . Years of education: Not on file  . Highest education level: Not on file  Occupational History  . 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 Occupation: Buys very distressed houses and rehabilitates  . Occupation: Does mediation  Social Needs  . Financial resource strain: Not on file  . Food insecurity:    Worry: Not on file    Inability: Not on file  .  Transportation needs:    Medical: Not on file    Non-medical: Not on file  Tobacco Use  . Smoking status: Never Smoker  . Smokeless tobacco: Never Used  Substance and Sexual Activity  . Alcohol use: No  . Drug use: No  . Sexual activity: Not on file  Lifestyle  . Physical activity:    Days per week: Not on file    Minutes per session: Not on file  . Stress: Not on file  Relationships  . Social connections:    Talks on phone: Not on file    Gets together: Not on file    Attends religious service: Not on file    Active member of club or organization: Not on file    Attends meetings of clubs or organizations: Not on file    Relationship status: Not on file  . Intimate partner violence:    Fear of current or ex partner: Not on file    Emotionally abused: Not on file    Physically abused: Not on file    Forced sexual activity: Not on file  Other Topics Concern  . Not on file  Social History Narrative   Has living will   Wife is health care POA---alternate is committee of physicians   Would accept resuscitation---wouldn't accept prolonged ventilation   No tube feeds if cognitively unaware   Review of Systems No fever Chronic cough which hasn't changed Appetite is okay No nausea--but gets sense of stomach upset    Objective:   Physical Exam  Constitutional: He appears well-developed. No distress.  Neck: No thyromegaly present.  Cardiovascular: Normal rate, regular rhythm, normal Braun sounds and intact distal pulses. Exam reveals no gallop.  No murmur heard. Respiratory: Effort normal and breath sounds normal. No respiratory distress. He has no wheezes. He has no rales.  GI: Soft. There is no tenderness.  Musculoskeletal: He exhibits no edema or tenderness.  Lymphadenopathy:    He has no cervical adenopathy.  Psychiatric: He has a normal mood and affect. His behavior is normal.           Assessment & Plan:

## 2017-12-21 NOTE — Telephone Encounter (Signed)
Pt walked in with lt sided chest pain and last 2 days tightness in chest, soreness in lt shoulder; SOB intermittent; SOB not worse with exertion, seems to have SOB when worries about something. Pt has been under stress this week. Pt never had feelings like this before.No dizziness or h/a. Pt has not missed any BP meds. On 12/19/17 BP 140/97; today BP 156/90, T 98.2 oral and Pulse 74. Pulse ox 98 % room air and wt 157 lbs.Dr [name] said he would work pt in and do EKG. Pt taken to room and [facility] CMA with pt.

## 2017-12-21 NOTE — Patient Instructions (Signed)
Please try over the counter omeprazole 20mg  daily on an empty stomach for the next 1-2 weeks, to see if it helps. If you have significant chest pressure again, call 911.

## 2017-12-21 NOTE — Assessment & Plan Note (Signed)
His history is somewhat concerning Not clearly GI Stress related--but could be coronary ischemia EKG shows sinus at 74, non diagnostic inferior Q waves. Otherwise normal intervals and no acute ischemic changes. No change from 09/24/13  Will try empiric omeprazole I am not comfortable setting up ETT in setting of mild persistent symptoms Will ask cardiology to see

## 2017-12-25 ENCOUNTER — Ambulatory Visit: Payer: PPO | Admitting: Cardiovascular Disease

## 2017-12-25 ENCOUNTER — Encounter: Payer: Self-pay | Admitting: Cardiovascular Disease

## 2017-12-25 VITALS — BP 136/82 | HR 69 | Ht 67.0 in | Wt 156.0 lb

## 2017-12-25 DIAGNOSIS — R072 Precordial pain: Secondary | ICD-10-CM | POA: Diagnosis not present

## 2017-12-25 NOTE — Progress Notes (Signed)
12/25/2017 [name]   10/28/1940  [id number]  Primary Physician [name], MD Primary Cardiologist: [name] MD Lupe Carney, [place]  HPI:  James Braun is a 77 y.o. 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  He has a 96% success rate.  He was referred by Dr. Annamarie Major for cardiovascular evaluation because of new onset chest pain.  He does have a history of treated hypertension, diabetes and hyperlipidemia.  He is never had a heart attack or stroke.  He has no other cardiac risk factors.  He had a stressful event in his life 2 and half weeks ago chest pain subsequent to that and daily since.  The pain is not necessarily occur with exertion.  It occurs randomly lasts minutes at a time without radiation.  He does exercise 3 days a week to 30 minutes at a time on the treadmill and does intermittent fasting as well.   Current Meds  Medication Sig  . aspirin 81 MG tablet Take 81 mg by mouth daily.   Marland Kitchen diltiazem (CARDIZEM CD) 240 MG 24 hr capsule Take 1 capsule (240 mg total) by mouth daily.  . finasteride (PROSCAR) 5 MG tablet Take 1 tablet (5 mg total) by mouth daily.  . metFORMIN (GLUCOPHAGE-XR) 500 MG 24 hr tablet Take 1 tablet (500 mg total) by mouth 2 (two) times daily.  . Multiple Vitamin (MULTIVITAMIN) tablet Take 1 tablet by mouth daily.  . Potassium Citrate 15 MEQ (1620 MG) TBCR TAKE 1 TABLET BY MOUTH 3 TIMES A DAY  . Red Yeast Rice Extract (RED YEAST RICE PO) Take 1 tablet by mouth daily.   Marland Kitchen telmisartan (MICARDIS) 80 MG tablet Take 1 tablet (80 mg total) by mouth every morning.  . triamterene-hydrochlorothiazide (MAXZIDE-25) 37.5-25 MG tablet Take 1 tablet by mouth daily.     Allergies  Allergen Reactions  . Atorvastatin Other (See Comments)    REACTION: myalgias  . Morphine And Related Other (See Comments)     hypotension  . Levofloxacin Rash  .  Septra [Sulfamethoxazole-Trimethoprim] Rash  . Sulfamethoxazole Rash    Social History   Socioeconomic History  . Marital status: Married    Spouse name: Not on file  . Number of children: 2  . Years of education: Not on file  . Highest education level: Not on file  Occupational History  . 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 Occupation: Buys very distressed houses and rehabilitates  . Occupation: Does mediation  Social Needs  . Financial resource strain: Not on file  . Food insecurity:    Worry: Not on file    Inability: Not on file  . Transportation needs:    Medical: Not on file    Non-medical: Not on file  Tobacco Use  . Smoking status: Never Smoker  . Smokeless tobacco: Never Used  Substance and Sexual Activity  . Alcohol use: No  . Drug use: No  . Sexual activity: Not on file  Lifestyle  . Physical activity:    Days per week: Not on file    Minutes per session: Not on file  . Stress: Not on file  Relationships  . Social connections:    Talks on phone: Not on file    Gets together: Not on file    Attends religious service: Not on file    Active member  of club or organization: Not on file    Attends meetings of clubs or organizations: Not on file    Relationship status: Not on file  . Intimate partner violence:    Fear of current or ex partner: Not on file    Emotionally abused: Not on file    Physically abused: Not on file    Forced sexual activity: Not on file  Other Topics Concern  . Not on file  Social History Narrative   Has living will   Wife is health care POA---alternate is committee of physicians   Would accept resuscitation---wouldn't accept prolonged ventilation   No tube feeds if cognitively unaware     Review of Systems: General: negative for chills, fever, night sweats or weight changes.  Cardiovascular: negative for chest pain, dyspnea on exertion, edema, orthopnea, palpitations, paroxysmal nocturnal dyspnea or shortness of  breath Dermatological: negative for rash Respiratory: negative for cough or wheezing Urologic: negative for hematuria Abdominal: negative for nausea, vomiting, diarrhea, bright red blood per rectum, melena, or hematemesis Neurologic: negative for visual changes, syncope, or dizziness All other systems reviewed and are otherwise negative except as noted above.    Blood pressure 136/82, pulse 69, height 5\' 7"  (1.702 m), weight 156 lb (70.8 kg), SpO2 97 %.  General appearance: alert and no distress Neck: no adenopathy, no carotid bruit, no JVD, supple, symmetrical, trachea midline and thyroid not enlarged, symmetric, no tenderness/mass/nodules Lungs: clear to auscultation bilaterally Heart: regular rate and rhythm, S1, S2 normal, no murmur, click, rub or gallop Extremities: extremities normal, atraumatic, no cyanosis or edema Pulses: 2+ and symmetric Skin: Skin color, texture, turgor normal. No rashes or lesions Neurologic: Alert and oriented X 3, normal strength and tone. Normal symmetric reflexes. Normal coordination and gait  EKG not performed today  ASSESSMENT AND PLAN:   Hyperlipemia History of hyperlipidemia not on statin therapy with recent lipid profile performed 02/22/2017 revealing total cholesterol 168, LDL 108 and HDL of 33.  This is acceptable at this time for primary prevention.  Essential hypertension, benign History of essential hypertension her blood pressure measured today at 136/82.  He is on diltiazem and Maxide.  Continue current meds at current dosing.  Chest pain New onset chest pain approximately 2 weeks ago which has been occurring on a daily basis since.  The pain is somewhat atypical.  It does not radiate.  He is also not noticed diaphoresis and nausea.  I am going to get an exercise Myoview stress test and 2D echo to further evaluate.      [name] MD FACP,FACC,FAHA, [facility] 12/25/2017 8:05 AM

## 2017-12-25 NOTE — Assessment & Plan Note (Signed)
New onset chest pain approximately 2 weeks ago which has been occurring on a daily basis since.  The pain is somewhat atypical.  It does not radiate.  He is also not noticed diaphoresis and nausea.  I am going to get an exercise Myoview stress test and 2D echo to further evaluate.

## 2017-12-25 NOTE — Patient Instructions (Signed)
Medication Instructions:   NO CHANGE  Testing/Procedures:  Your physician has requested that you have an echocardiogram. Echocardiography is a painless test that uses sound waves to create images of your heart. It provides your doctor with information about the size and shape of your heart and how well your heart's chambers and valves are working. This procedure takes approximately one hour. There are no restrictions for this procedure.   Your physician has requested that you have en exercise stress myoview. For further information please visit [email or web address]. Please follow instruction sheet, as given.TAKE ALL MEDICATIONS    Follow-Up:  Your physician recommends that you schedule a follow-up appointment in:  [place]

## 2017-12-25 NOTE — Assessment & Plan Note (Signed)
History of hyperlipidemia not on statin therapy with recent lipid profile performed 02/22/2017 revealing total cholesterol 168, LDL 108 and HDL of 33.  This is acceptable at this time for primary prevention.

## 2017-12-25 NOTE — Assessment & Plan Note (Signed)
History of essential hypertension her blood pressure measured today at 136/82.  He is on diltiazem and Maxide.  Continue current meds at current dosing.

## 2018-01-04 ENCOUNTER — Telehealth (HOSPITAL_COMMUNITY): Payer: Self-pay

## 2018-01-04 NOTE — Telephone Encounter (Signed)
Encounter complete. 

## 2018-01-07 ENCOUNTER — Other Ambulatory Visit: Payer: Self-pay

## 2018-01-07 ENCOUNTER — Ambulatory Visit (HOSPITAL_COMMUNITY): Payer: PPO | Attending: Cardiology

## 2018-01-07 DIAGNOSIS — R072 Precordial pain: Secondary | ICD-10-CM | POA: Diagnosis not present

## 2018-01-07 DIAGNOSIS — E785 Hyperlipidemia, unspecified: Secondary | ICD-10-CM | POA: Diagnosis not present

## 2018-01-07 DIAGNOSIS — I10 Essential (primary) hypertension: Secondary | ICD-10-CM | POA: Insufficient documentation

## 2018-01-07 DIAGNOSIS — E119 Type 2 diabetes mellitus without complications: Secondary | ICD-10-CM | POA: Insufficient documentation

## 2018-01-09 ENCOUNTER — Ambulatory Visit (HOSPITAL_COMMUNITY)
Admission: RE | Admit: 2018-01-09 | Discharge: 2018-01-09 | Disposition: A | Discharge status: Home / Self Care | Payer: PPO | Source: Ambulatory Visit | Attending: Cardiovascular Disease | Admitting: Cardiovascular Disease

## 2018-01-09 DIAGNOSIS — R072 Precordial pain: Secondary | ICD-10-CM

## 2018-01-09 LAB — MYOCARDIAL PERFUSION IMAGING
CHL CUP NUCLEAR SRS: 0
CHL CUP RESTING HR STRESS: 75 {beats}/min
CSEPEDS: 31 s
CSEPHR: 85 %
CSEPPHR: 122 {beats}/min
Estimated workload: 11.1 METS
Exercise duration (min): 10 min
LV sys vol: 28 mL
LVDIAVOL: 70 mL (ref 62–150)
MPHR: 143 {beats}/min
RPE: 19
SDS: 2
SSS: 2
TID: 0.82

## 2018-01-09 MED ORDER — TECHNETIUM TC 99M TETROFOSMIN IV KIT
32.0000 | PACK | Freq: Once | INTRAVENOUS | Status: AC | PRN
  Administered 2018-01-09: 32 via INTRAVENOUS
  Filled 2018-01-09: qty 32

## 2018-01-09 MED ORDER — TECHNETIUM TC 99M TETROFOSMIN IV KIT
10.6000 | PACK | Freq: Once | INTRAVENOUS | Status: AC | PRN
  Administered 2018-01-09: 10.6 via INTRAVENOUS
  Filled 2018-01-09: qty 11

## 2018-02-19 ENCOUNTER — Other Ambulatory Visit: Payer: Self-pay | Admitting: Internal Medicine

## 2018-02-19 DIAGNOSIS — E1140 Type 2 diabetes mellitus with diabetic neuropathy, unspecified: Secondary | ICD-10-CM

## 2018-02-23 ENCOUNTER — Encounter (HOSPITAL_COMMUNITY): Payer: Self-pay | Admitting: Emergency Medicine

## 2018-02-23 ENCOUNTER — Other Ambulatory Visit: Payer: Self-pay

## 2018-02-23 ENCOUNTER — Encounter (HOSPITAL_COMMUNITY): Payer: Self-pay

## 2018-02-23 ENCOUNTER — Emergency Department (HOSPITAL_COMMUNITY): Payer: PPO

## 2018-02-23 ENCOUNTER — Emergency Department (HOSPITAL_COMMUNITY)
Admission: EM | Admit: 2018-02-23 | Discharge: 2018-02-23 | Disposition: A | Discharge status: Home / Self Care | Payer: PPO | Attending: Emergency Medicine | Admitting: Emergency Medicine

## 2018-02-23 ENCOUNTER — Ambulatory Visit (HOSPITAL_COMMUNITY)
Admission: EM | Admit: 2018-02-23 | Discharge: 2018-02-23 | Disposition: A | Discharge status: Home / Self Care | Payer: PPO | Source: Home / Self Care | Attending: Internal Medicine | Admitting: Internal Medicine

## 2018-02-23 DIAGNOSIS — Z7984 Long term (current) use of oral hypoglycemic drugs: Secondary | ICD-10-CM | POA: Insufficient documentation

## 2018-02-23 DIAGNOSIS — R509 Fever, unspecified: Secondary | ICD-10-CM | POA: Diagnosis not present

## 2018-02-23 DIAGNOSIS — I10 Essential (primary) hypertension: Secondary | ICD-10-CM | POA: Diagnosis not present

## 2018-02-23 DIAGNOSIS — E119 Type 2 diabetes mellitus without complications: Secondary | ICD-10-CM | POA: Insufficient documentation

## 2018-02-23 DIAGNOSIS — L03115 Cellulitis of right lower limb: Secondary | ICD-10-CM | POA: Diagnosis not present

## 2018-02-23 DIAGNOSIS — Z79899 Other long term (current) drug therapy: Secondary | ICD-10-CM | POA: Insufficient documentation

## 2018-02-23 DIAGNOSIS — Z7982 Long term (current) use of aspirin: Secondary | ICD-10-CM | POA: Diagnosis not present

## 2018-02-23 DIAGNOSIS — Z85820 Personal history of malignant melanoma of skin: Secondary | ICD-10-CM | POA: Diagnosis not present

## 2018-02-23 DIAGNOSIS — R21 Rash and other nonspecific skin eruption: Secondary | ICD-10-CM

## 2018-02-23 LAB — COMPREHENSIVE METABOLIC PANEL
ALBUMIN: 3.7 g/dL (ref 3.5–5.0)
ALK PHOS: 57 U/L (ref 38–126)
ALT: 42 U/L (ref 0–44)
ANION GAP: 14 (ref 5–15)
AST: 53 U/L — ABNORMAL HIGH (ref 15–41)
BUN: 32 mg/dL — ABNORMAL HIGH (ref 8–23)
CALCIUM: 9.6 mg/dL (ref 8.9–10.3)
CHLORIDE: 99 mmol/L (ref 98–111)
CO2: 26 mmol/L (ref 22–32)
Creatinine, Ser: 2.15 mg/dL — ABNORMAL HIGH (ref 0.61–1.24)
GFR calc Af Amer: 32 mL/min — ABNORMAL LOW (ref >60)
GFR calc non Af Amer: 28 mL/min — ABNORMAL LOW (ref >60)
Glucose, Bld: 169 mg/dL — ABNORMAL HIGH (ref 70–99)
Potassium: 3.8 mmol/L (ref 3.5–5.1)
Sodium: 139 mmol/L (ref 135–145)
Total Bilirubin: 0.7 mg/dL (ref 0.3–1.2)
Total Protein: 6.7 g/dL (ref 6.5–8.1)

## 2018-02-23 LAB — URINALYSIS, ROUTINE W REFLEX MICROSCOPIC
BACTERIA UA: NONE SEEN
BILIRUBIN URINE: NEGATIVE
Glucose, UA: 150 mg/dL — AB
Hgb urine dipstick: NEGATIVE
KETONES UR: NEGATIVE mg/dL
LEUKOCYTES UA: NEGATIVE
Nitrite: NEGATIVE
PROTEIN: 30 mg/dL — AB
Specific Gravity, Urine: 1.012 (ref 1.005–1.030)
pH: 5 (ref 5.0–8.0)

## 2018-02-23 LAB — I-STAT CG4 LACTIC ACID, ED
Lactic Acid, Venous: 2.22 mmol/L (ref 0.5–1.9)
Lactic Acid, Venous: 1.37 mmol/L (ref 0.5–1.9)

## 2018-02-23 LAB — CBC WITH DIFFERENTIAL/PLATELET
ABS IMMATURE GRANULOCYTES: 0 10*3/uL (ref 0.0–0.1)
BASOS PCT: 0 %
Basophils Absolute: 0 10*3/uL (ref 0.0–0.1)
Eosinophils Absolute: 0 10*3/uL (ref 0.0–0.7)
Eosinophils Relative: 1 %
HEMATOCRIT: 41.7 % (ref 39.0–52.0)
Hemoglobin: 14.1 g/dL (ref 13.0–17.0)
Immature Granulocytes: 0 %
LYMPHS PCT: 7 %
Lymphs Abs: 0.4 10*3/uL — ABNORMAL LOW (ref 0.7–4.0)
MCH: 29.9 pg (ref 26.0–34.0)
MCHC: 33.8 g/dL (ref 30.0–36.0)
MCV: 88.5 fL (ref 78.0–100.0)
MONO ABS: 0.6 10*3/uL (ref 0.1–1.0)
Monocytes Relative: 12 %
NEUTROS ABS: 4.3 10*3/uL (ref 1.7–7.7)
Neutrophils Relative %: 80 %
Platelets: 245 10*3/uL (ref 150–400)
RBC: 4.71 MIL/uL (ref 4.22–5.81)
RDW: 14.6 % (ref 11.5–15.5)
WBC: 5.4 10*3/uL (ref 4.0–10.5)

## 2018-02-23 MED ORDER — DOXYCYCLINE HYCLATE 100 MG PO TABS
100.0000 mg | ORAL_TABLET | Freq: Once | ORAL | Status: AC
  Administered 2018-02-23: 100 mg via ORAL
  Filled 2018-02-23: qty 1

## 2018-02-23 MED ORDER — SODIUM CHLORIDE 0.9 % IV BOLUS
1000.0000 mL | Freq: Once | INTRAVENOUS | Status: AC
  Administered 2018-02-23: 1000 mL via INTRAVENOUS

## 2018-02-23 MED ORDER — DOXYCYCLINE HYCLATE 100 MG PO CAPS: 100.0000 mg | ORAL_CAPSULE | Freq: Two times a day (BID) | ORAL | 0 refills | Status: DC

## 2018-02-23 MED ORDER — ACETAMINOPHEN 500 MG PO TABS
1000.0000 mg | ORAL_TABLET | Freq: Once | ORAL | Status: AC
  Administered 2018-02-23: 1000 mg via ORAL
  Filled 2018-02-23: qty 2

## 2018-02-23 MED ORDER — SODIUM CHLORIDE 0.9 % IV SOLN
1.0000 g | Freq: Once | INTRAVENOUS | Status: AC
  Administered 2018-02-23: 1 g via INTRAVENOUS
  Filled 2018-02-23: qty 10

## 2018-02-23 NOTE — ED Notes (Signed)
Patient transported to X-ray 

## 2018-02-23 NOTE — Discharge Instructions (Signed)
Thank you for allowing me to care for you today in the Emergency Department.   Please call and schedule follow-up appointment with your primary care provider for a recheck in the clinic on Monday or Tuesday.  You have been treated in the emergency department with fluids, IV antibiotics, and your first dose of an antibiotic called doxycycline.  Starting tomorrow morning, take 1 tablet of doxycycline 2 times daily.   Take 1000 mg of Tylenol every 8 hours for fever or chills.  I would recommend avoiding Aleve, NSAIDs, and ibuprofen since your kidney function was somewhat elevated today.  Return to the emergency department if you develop a fever despite taking Tylenol, worsening redness, pain, or swelling of your right lower leg, or new, concerning symptoms.

## 2018-02-23 NOTE — Discharge Instructions (Signed)
Please go to ER for more thorough evaluation of your symptoms.

## 2018-02-23 NOTE — ED Provider Notes (Signed)
[facility]    CSN: [id number] Arrival date & time: 02/23/18  1551     History   Chief Complaint Chief Complaint  Patient presents with  . Weakness    HPI James Braun is a 77 y.o. male.   [name] presents with complaints of fatigue, "not feeling well," chills, fever, rash to right leg, nausea. Has had abdominal symptoms for a few weeks, had a cardiac evaluation with echo and stress test completed due to a sensation of palpitations, without findings. Denies chest pain or palpitations. States he stopped drinking caffeine which helped. No vomiting. No diaphoresis. Starting yesterday he developed fatigue and fever as well as leg rash. Rash is not painful. No known injury or exposure. Denies any previous similar. Rash does not itch. Denies urinary symptoms. Denies URI symptoms. Took advil this morning which seemed to help some. Hx of kidney stones, htn, dm, bph.    ROS per HPI.      Past Medical History:  Diagnosis Date  . BPH (benign prostatic hypertrophy)   . History of kidney stones   . History of melanoma excision OCT 2010  . Hyperlipidemia   . Hypertension   . Left ureteral calculus   . Nephrolithiasis    bilateral  . Type 2 diabetes mellitus [facility])     Patient Active Problem List   Diagnosis Date Noted  . Chest pain 12/21/2017  . Erectile dysfunction associated with type 2 diabetes mellitus ([place]) 03/10/2015  . Counseling regarding advanced directives 02/13/2014  . Onychomycosis 04/25/2013  . Routine general medical examination at a health care facility 01/04/2011  . BPH without obstruction/lower urinary tract symptoms 07/13/2010  . Type 2 diabetes, controlled, with neuropathy ([place]) 01/11/2010  . Hyperlipemia 01/11/2010  . Essential hypertension, benign 01/11/2010  . NEPHROLITHIASIS, HX OF 01/11/2010    Past Surgical History:  Procedure Laterality Date  . CHOLECYSTECTOMY  july 2000  . CYSTOSCOPY WITH STENT PLACEMENT Left 11/14/2012   Procedure:  CYSTOSCOPY WITH STENT PLACEMENT;  Surgeon: [name], MD;  Location: [facility];  Service: Urology;  Laterality: Left;  . CYSTOSCOPY/RETROGRADE/URETEROSCOPY/STONE EXTRACTION WITH BASKET Left 11/14/2012   Procedure: CYSTOSCOPY/RETROGRADE/URETEROSCOPY/STONE EXTRACTION WITH BASKET;  Surgeon: [name], MD;  Location: [facility];  Service: Urology;  Laterality: Left;  . EXTRACORPOREAL SHOCK WAVE LITHOTRIPSY  JULY 2000   X2  . HOLMIUM LASER APPLICATION Left 11/17/2704   Procedure: HOLMIUM LASER APPLICATION;  Surgeon: [name], MD;  Location: [facility];  Service: Urology;  Laterality: Left;  . LEFT URETEROSCOPIC STONE EXTRACTION  02-22-2007  . NEPHROLITHOTOMY Right 09-28-2010   percutaneous  . ORIF CLAVICLE FRACTURE  2006  . PROSTATE BIOPSY  1998   neg  . SHOULDER ARTHROSCOPY  1/15   Dr [name]  . TONSILLECTOMY         Home Medications    Prior to Admission medications   Medication Sig Start Date End Date Taking? Authorizing Provider  aspirin 81 MG tablet Take 81 mg by mouth daily.    Yes [provider]  diltiazem (CARDIZEM CD) 240 MG 24 hr capsule Take 1 capsule (240 mg total) by mouth daily. 08/22/17  Yes [name], MD  finasteride (PROSCAR) 5 MG tablet Take 1 tablet (5 mg total) by mouth daily. 08/22/17  Yes [name], MD  metFORMIN (GLUCOPHAGE-XR) 500 MG 24 hr tablet Take 1 tablet (500 mg total) by mouth 2 (two) times daily. 08/22/17  Yes [name], MD  Multiple Vitamin (MULTIVITAMIN) tablet Take 1 tablet by mouth daily.   Yes [provider]  Potassium Citrate 15 MEQ (1620 MG) TBCR TAKE 1 TABLET BY MOUTH 3 TIMES A DAY 11/26/17  Yes [name], MD  Red Yeast Rice Extract (RED YEAST RICE PO) Take 1 tablet by mouth daily.    Yes [provider]  telmisartan (MICARDIS) 80 MG tablet Take 1 tablet (80 mg total) by mouth every morning. 08/22/17  Yes [name], MD    timolol (BETIMOL) 0.25 % ophthalmic solution 1-2 drops 2 (two) times daily.   Yes [provider]  triamterene-hydrochlorothiazide (MAXZIDE-25) 37.5-25 MG tablet Take 1 tablet by mouth daily. 08/22/17  Yes [name], MD    Family History Family History  Problem Relation Age of Onset  . Hypertension Mother   . Diabetes Father   . Hypertension Father   . Coronary artery disease Neg Hx   . Cancer Neg Hx     Social History Social History   Tobacco Use  . Smoking status: Never Smoker  . Smokeless tobacco: Never Used  Substance Use Topics  . Alcohol use: No  . Drug use: No     Allergies   Atorvastatin; Morphine and related; Levofloxacin; Septra [sulfamethoxazole-trimethoprim]; and Sulfamethoxazole   Review of Systems Review of Systems   Physical Exam Triage Vital Signs ED Triage Vitals  Enc Vitals Group     BP 02/23/18 1705 (!) 153/83     Pulse Rate 02/23/18 1705 94     Resp 02/23/18 1705 14     Temp 02/23/18 1705 100.2 F (37.9 C)     Temp Source 02/23/18 1705 Oral     SpO2 02/23/18 1705 95 %     Weight --      Height --      Head Circumference --      Peak Flow --      Pain Score 02/23/18 1707 0     Pain Loc --      Pain Edu? --      Excl. in [place]? --    No data found.  Updated Vital Signs BP (!) 153/83 (BP Location: Left Arm)   Pulse 94   Temp 100.2 F (37.9 C) (Oral)   Resp 14   SpO2 95%   Visual Acuity Right Eye Distance:   Left Eye Distance:   Bilateral Distance:    Right Eye Near:   Left Eye Near:    Bilateral Near:     Physical Exam  Constitutional: He is oriented to person, place, and time. He appears well-developed and well-nourished.  Cardiovascular: Normal rate and regular rhythm.  Pulmonary/Chest: Effort normal and breath sounds normal.  Neurological: He is alert and oriented to person, place, and time.  Skin: Skin is warm and dry. Rash noted.     Right lower lateral leg with rash, blanches very slightly; non  tender; red, confluent, circular, large approximately 8 cm in diameter      UC Treatments / Results  Labs (all labs ordered are listed, but only abnormal results are displayed) Labs Reviewed - No data to display  EKG None  Radiology No results found.  Procedures Procedures (including critical care time)  Medications Ordered in UC Medications - No data to display  Initial Impression / Assessment and Plan / UC Course  I have reviewed the triage vital signs and the nursing notes.  Pertinent labs & imaging results that were available during my care  of the patient were reviewed by me and considered in my medical decision making (see chart for details).     Unclear source of fever and hr in 90's. No uri symptoms. Has had intermittent chest symptoms without acute findings. New rash. No specific abdominal or gu symptoms. In this 77 yo patient with htn and diabetes recommended a complete and thorough evaluation in er for source of fever. Patient verbalized understanding and agreeable to plan.  Patient transported by wife to Er.  Final Clinical Impressions(s) / UC Diagnoses   Final diagnoses:  Fever, unspecified fever cause  Rash and nonspecific skin eruption     Discharge Instructions     Please go to ER for more thorough evaluation of your symptoms.    ED Prescriptions    None     Controlled Substance Prescriptions  Controlled Substance Registry consulted? Not Applicable   Zigmund Gottron, NP 02/23/18 1735

## 2018-02-23 NOTE — ED Notes (Signed)
EDP at bedside  

## 2018-02-23 NOTE — ED Triage Notes (Signed)
Pt presents with fever, chills, fatigue, and skin rash to lateral R lower leg since Friday

## 2018-02-23 NOTE — ED Triage Notes (Signed)
Pt presents today with fever, weakness, chills, and rash on right leg that has been going on within the last 4-5 days. States he has not been feeling well for a while.

## 2018-02-23 NOTE — ED Notes (Signed)
I [place] results of 2.22 reported to Dr. Lindajo Royal.

## 2018-02-23 NOTE — ED Notes (Signed)
Patient verbalizes understanding of discharge instructions. Opportunity for questioning and answers were provided. Armband removed by staff, pt discharged from ED.  

## 2018-02-23 NOTE — ED Provider Notes (Signed)
[facility] EMERGENCY DEPARTMENT Provider Note   CSN: [id number] Arrival date & time: 02/23/18  1731     History   Chief Complaint Chief Complaint  Patient presents with  . Fever  . Chills  . Fatigue  . Rash    HPI James Braun is a 77 y.o. male with a history of diabetes mellitus type 2, nephrolithiasis, melanoma, and HTN who presents to the emergency department with a chief complaint of fever.  The patient endorses fever with associated chills, generalized weakness and generalized malaise and soreness for the last 5 days.  His wife reports that she returned home from a trip 5 days ago and felt that the patient appeared more "unsteady on his feet like he might topple over."  He reports he did not actually check his temperature at home until last night when it was found to be 101.2, it resolved after taking ibuprofen.  He denies chest pain, dyspnea, cough, URI symptoms, nausea, vomiting, diarrhea, abdominal pain, flank pain, or back pain.  He also endorses a rash to his right lower leg that he first noticed last night, which has continued to enlarge.   He reports that he has been outside more over the last few days while he has been mowing grass and picking blackberries.  The area is not pruritic.  No history of similar.  No known tick bites.   The patient is established with Dr. Diona Fanti with urology and has required ureteral stent placement for stone removal in the past.  He reports that he has several known stones that have not passed.  The history is provided by the patient. No language interpreter was used.    Past Medical History:  Diagnosis Date  . BPH (benign prostatic hypertrophy)   . History of kidney stones   . History of melanoma excision OCT 2010  . Hyperlipidemia   . Hypertension   . Left ureteral calculus   . Nephrolithiasis    bilateral  . Type 2 diabetes mellitus [facility])     Patient Active Problem List   Diagnosis Date Noted  .  Chest pain 12/21/2017  . Erectile dysfunction associated with type 2 diabetes mellitus ([place]) 03/10/2015  . Counseling regarding advanced directives 02/13/2014  . Onychomycosis 04/25/2013  . Routine general medical examination at a health care facility 01/04/2011  . BPH without obstruction/lower urinary tract symptoms 07/13/2010  . Type 2 diabetes, controlled, with neuropathy ([place]) 01/11/2010  . Hyperlipemia 01/11/2010  . Essential hypertension, benign 01/11/2010  . NEPHROLITHIASIS, HX OF 01/11/2010    Past Surgical History:  Procedure Laterality Date  . CHOLECYSTECTOMY  july 2000  . CYSTOSCOPY WITH STENT PLACEMENT Left 11/14/2012   Procedure: CYSTOSCOPY WITH STENT PLACEMENT;  Surgeon: [name], MD;  [facility];  Service: Urology;  Laterality: Left;  . CYSTOSCOPY/RETROGRADE/URETEROSCOPY/STONE EXTRACTION WITH BASKET Left 11/14/2012   Procedure: CYSTOSCOPY/RETROGRADE/URETEROSCOPY/STONE EXTRACTION WITH BASKET;  Surgeon: [name], MD;  Location: [facility];  Service: Urology;  Laterality: Left;  . EXTRACORPOREAL SHOCK WAVE LITHOTRIPSY  JULY 2000   X2  . HOLMIUM LASER APPLICATION Left 03/25/3569   Procedure: HOLMIUM LASER APPLICATION;  Surgeon: [name], MD;  Location: [facility];  Service: Urology;  Laterality: Left;  . LEFT URETEROSCOPIC STONE EXTRACTION  02-22-2007  . NEPHROLITHOTOMY Right 09-28-2010   percutaneous  . ORIF CLAVICLE FRACTURE  2006  . PROSTATE BIOPSY  1998   neg  . SHOULDER ARTHROSCOPY  1/15   Dr  [place] Medications    Prior to Admission medications   Medication Sig Start Date End Date Taking? Authorizing Provider  aspirin 81 MG tablet Take 81 mg by mouth daily.     [provider]  diltiazem (CARDIZEM CD) 240 MG 24 hr capsule Take 1 capsule (240 mg total) by mouth daily. 08/22/17   [name], MD  doxycycline (VIBRAMYCIN) 100 MG capsule Take  1 capsule (100 mg total) by mouth 2 (two) times daily. 02/23/18   Shandi Godfrey A, PA-C  finasteride (PROSCAR) 5 MG tablet Take 1 tablet (5 mg total) by mouth daily. 08/22/17   [name], MD  metFORMIN (GLUCOPHAGE-XR) 500 MG 24 hr tablet Take 1 tablet (500 mg total) by mouth 2 (two) times daily. 08/22/17   [name], MD  Multiple Vitamin (MULTIVITAMIN) tablet Take 1 tablet by mouth daily.    [provider]  Potassium Citrate 15 MEQ (1620 MG) TBCR TAKE 1 TABLET BY MOUTH 3 TIMES A DAY 11/26/17   [name], MD  Red Yeast Rice Extract (RED YEAST RICE PO) Take 1 tablet by mouth daily.     [provider]  telmisartan (MICARDIS) 80 MG tablet Take 1 tablet (80 mg total) by mouth every morning. 08/22/17   [name], MD  timolol (BETIMOL) 0.25 % ophthalmic solution 1-2 drops 2 (two) times daily.    [provider]  triamterene-hydrochlorothiazide (MAXZIDE-25) 37.5-25 MG tablet Take 1 tablet by mouth daily. 08/22/17   [name], MD    Family History Family History  Problem Relation Age of Onset  . Hypertension Mother   . Diabetes Father   . Hypertension Father   . Coronary artery disease Neg Hx   . Cancer Neg Hx     Social History Social History   Tobacco Use  . Smoking status: Never Smoker  . Smokeless tobacco: Never Used  Substance Use Topics  . Alcohol use: No  . Drug use: No     Allergies   Atorvastatin; Morphine and related; Levofloxacin; Septra [sulfamethoxazole-trimethoprim]; and Sulfamethoxazole   Review of Systems Review of Systems  Constitutional: Positive for chills and fever. Negative for appetite change.  HENT: Negative for congestion and sore throat.   Eyes: Negative for visual disturbance.  Respiratory: Negative for cough, shortness of breath and wheezing.   Cardiovascular: Negative for chest pain.  Gastrointestinal: Negative for abdominal pain, blood in stool, diarrhea, nausea and vomiting.  Genitourinary:  Negative for dysuria, flank pain, hematuria and urgency.  Musculoskeletal: Negative for back pain.  Skin: Positive for color change and rash.  Allergic/Immunologic: Negative for immunocompromised state.  Neurological: Positive for weakness. Negative for dizziness, light-headedness and headaches.  Psychiatric/Behavioral: Negative for confusion.   Physical Exam Updated Vital Signs BP (!) 123/56   Pulse 85   Temp (!) 100.6 F (38.1 C) (Rectal)   Resp (!) 23   Ht 5\' 7"  (1.702 m)   Wt 70.3 kg   SpO2 95%   BMI 24.28 kg/m   Physical Exam  Constitutional: He appears well-developed.  HENT:  Head: Normocephalic.  Eyes: Conjunctivae are normal.  Neck: Neck supple.  Cardiovascular: Normal rate, regular rhythm, normal heart sounds and intact distal pulses. Exam reveals no gallop and no friction rub.  No murmur heard. Pulmonary/Chest: Effort normal and breath sounds normal. No stridor. No respiratory distress. He has no wheezes. He has no rales. He exhibits  no tenderness.  Abdominal: Soft. Bowel sounds are normal. He exhibits no distension and no mass. There is tenderness. There is no rebound and no guarding. No hernia.  Minimal discomfort in the left upper quadrant with palpation.  No CVA tenderness bilaterally.  Abdomen is soft, nondistended.  No peritoneal signs.  No rebound or guarding.  Neurological: He is alert.  Skin: Skin is warm and dry. Rash noted.  There is a large, nonblanching area of erythema and warmth to the right lower extremity.  No edema.  Psychiatric: His behavior is normal.  Nursing note and vitals reviewed.      ED Treatments / Results  Labs (all labs ordered are listed, but only abnormal results are displayed) Labs Reviewed  COMPREHENSIVE METABOLIC PANEL - Abnormal; Notable for the following components:      Result Value   Glucose, Bld 169 (*)    BUN 32 (*)    Creatinine, Ser 2.15 (*)    AST 53 (*)    GFR calc non Af Amer 28 (*)    GFR calc Af Amer 32 (*)     All other components within normal limits  CBC WITH DIFFERENTIAL/PLATELET - Abnormal; Notable for the following components:   Lymphs Abs 0.4 (*)    All other components within normal limits  URINALYSIS, ROUTINE W REFLEX MICROSCOPIC - Abnormal; Notable for the following components:   Glucose, UA 150 (*)    Protein, ur 30 (*)    All other components within normal limits  I-STAT CG4 LACTIC ACID, ED - Abnormal; Notable for the following components:   Lactic Acid, Venous 2.22 (*)    All other components within normal limits  CULTURE, BLOOD (ROUTINE X 2)  CULTURE, BLOOD (ROUTINE X 2)  ROCKY MTN SPOTTED FVR ABS PNL(IGG+IGM)  I-STAT CG4 LACTIC ACID, ED    EKG None  Radiology Dg Chest 2 View  Result Date: 02/23/2018 CLINICAL DATA:  Fever and fatigue. EXAM: CHEST - 2 VIEW COMPARISON:  September 29, 2010 FINDINGS: The heart size and mediastinal contours are within normal limits. Both lungs are clear. The visualized skeletal structures are unremarkable. IMPRESSION: No active cardiopulmonary disease. Electronically Signed   By: Dorise Bullion III M.D   On: 02/23/2018 18:24    Procedures Procedures (including critical care time)  Medications Ordered in ED Medications  sodium chloride 0.9 % bolus 1,000 mL (1,000 mLs Intravenous New Bag/Given 02/23/18 2019)  sodium chloride 0.9 % bolus 1,000 mL (0 mLs Intravenous Stopped 02/23/18 2013)  acetaminophen (TYLENOL) tablet 1,000 mg (1,000 mg Oral Given 02/23/18 1900)  cefTRIAXone (ROCEPHIN) 1 g in sodium chloride 0.9 % 100 mL IVPB (0 g Intravenous Stopped 02/23/18 2013)  doxycycline (VIBRA-TABS) tablet 100 mg (100 mg Oral Given 02/23/18 2019)     Initial Impression / Assessment and Plan / ED Course  I have reviewed the triage vital signs and the nursing notes.  Pertinent labs & imaging results that were available during my care of the patient were reviewed by me and considered in my medical decision making (see chart for details).     77 year old  male history of diabetes mellitus type 2, nephrolithiasis, melanoma, and HTN presenting from urgent care with fever, generalized weakness, and malaise for the last 5 days.  There is also a non-blanching lesion that he first noticed to the right lower leg last night.  The area is erythematous and warm, but does not appear like a typical cellulitis.  The patient was seen and evaluated by  Dr. Lacinda Axon, attending physician.  Rectal temp 101.7 on arrival.  Tylenol given.  IV Rocephin given in the ED.  He is otherwise hemodynamically stable.  Labs are notable for UA with mild glucosuria and mild proteinuria, but not concerning for infection.  Creatinine is 2.15, increased from the patient's baseline of around 1.0.  No leukocytosis.  Initial lactate 2.22, improving after 2 L of IV fluids.  Chest x-ray is negative.  Blood cultures x2 are pending.  The patient is nontoxic-appearing.  Doubt urosepsis or pneumonia.  Fever resolved following Tylenol.  The patient reports that he is feeling much better.  He has good follow-up with his primary care provider, and I recommended he schedule a follow-up appointment in the clinic when they reopen in 2 days.  Will treat the patient with doxycycline to cover for [facility] spotted fever and cellulitis.   Strict return precautions given to the ED.  The patient is hemodynamically stable and in no acute distress.  He is safe for discharge to home with outpatient follow-up.  Final Clinical Impressions(s) / ED Diagnoses   Final diagnoses:  Cellulitis of right lower extremity    ED Discharge Orders         Ordered    doxycycline (VIBRAMYCIN) 100 MG capsule  2 times daily     02/23/18 2103           Joline Maxcy A, PA-C 02/23/18 2118    Nat Christen, MD 02/24/18 [phone]

## 2018-02-25 ENCOUNTER — Telehealth: Payer: Self-pay

## 2018-02-25 NOTE — Telephone Encounter (Signed)
Per chart review tab pt was seen James Braun on 02/23/18. Pt has ED f/u already scheduled on 03/01/18. FYI to DR [name].

## 2018-02-25 NOTE — Telephone Encounter (Signed)
He is feeling better. The leg is itching and the rash is fading. He is fine to wait until 8-16.

## 2018-02-25 NOTE — Telephone Encounter (Signed)
Please make sure his leg is improving and he is feeling better---then we can wait till follow up 8/16

## 2018-02-25 NOTE — Telephone Encounter (Signed)
PLEASE NOTE: All timestamps contained within this report are represented as [place] Standard Time. CONFIDENTIALTY NOTICE: This fax transmission is intended only for the addressee. It contains information that is legally privileged, confidential or otherwise protected from use or disclosure. If you are not the intended recipient, you are strictly prohibited from reviewing, disclosing, copying using or disseminating any of this information or taking any action in reliance on or regarding this information. If you have received this fax in error, please notify us immediately by telephone so that we can arrange for its return to [place]. Phone: [phone], Toll-Free: [phone], Fax: [phone] Page: 1 of 2 Call Id: [id number] [place] Patient Name: ANTWAIN CALIENDO Gender: Male DOB: 1941-05-30 Age: 77 Y 11 M 21 D Return Phone Number: [phone] (Primary), [phone] (Secondary) Address: City/State/Zip: [place] [address] Client [facility] Night - Client Client Site [place] Physician Viviana Simpler - MD Contact Type Call Who Is Calling Patient / Member / Family / Caregiver Call Type Triage / Clinical Caller Name Laval Cafaro Relationship To Patient Spouse Return Phone Number [phone] (Primary) Chief Complaint Fever (non urgent symptom) (> THREE MONTHS) Reason for Call Symptomatic / Request for Sharon states her husband is running a fever of 101.2 taken orally and he has a discoloration on his right leg. CNWN: caller called and said she may just take him up to the clinic. Caller stated she would wait a few more minutes. Translation No Nurse Assessment Nurse: [name], RN, Estill Bamberg Date/Time (Eastern Time): 02/23/2018 3:24:59 PM Confirm and document reason for call. If symptomatic, describe  symptoms. ---Caller states her husband has a fever of 101.2 and discoloration on his right lower leg (bigger than a baseball). He is able to walk, and there is no pain. He feels weak, chills, hot flashes, and fatigued. He feels some fluttering in the chest at times with nausea. He has been feeling "not himself" for several weeks. He had his heart checked recently due to some SOB. No SOB at this time. Does the patient have any new or worsening symptoms? ---Yes Will a triage be completed? ---Yes Related visit to physician within the last 2 weeks? ---Yes Does the PT have any chronic conditions? (i.e. diabetes, asthma, this includes High risk factors for pregnancy, etc.) ---Yes List chronic conditions. ---diabetes Is this a behavioral health or substance abuse call? ---No Guidelines Guideline Title Affirmed Question Affirmed Notes Nurse Date/Time (Eastern Time) Weakness (Generalized) and Fatigue [1] MODERATE weakness (i.e., interferes with work, school, normal activities) Brooks, RN, Estill Bamberg 02/23/2018 3:27:11 PM PLEASE NOTE: All timestamps contained within this report are represented as [place] Standard Time. CONFIDENTIALTY NOTICE: This fax transmission is intended only for the addressee. It contains information that is legally privileged, confidential or otherwise protected from use or disclosure. If you are not the intended recipient, you are strictly prohibited from reviewing, disclosing, copying using or disseminating any of this information or taking any action in reliance on or regarding this information. If you have received this fax in error, please notify us immediately by telephone so that we can arrange for its return to [place]. Phone: [phone], Toll-Free: [phone], Fax: [phone] Page: 2 of 2 Call Id: [id number] Guidelines Guideline Title Affirmed Question Affirmed Notes Nurse Date/Time Eilene Ghazi Time) [2] cause unknown (Exceptions: weakness with acute minor  illness, or weakness from poor fluid intake) Disp. Time Eilene Ghazi  Time) Disposition Final User 02/23/2018 3:19:34 PM Send To Call Back Waiting For Nurse Rogue Jury 02/23/2018 3:31:14 PM See HCP within 4 Hours (or PCP triage) Yes [name], RN, Shelly Coss Disagree/Comply Comply Caller Understands Yes PreDisposition Go to Urgent Care/Walk-In Clinic Care Advice Given Per Guideline SEE HCP WITHIN 4 HOURS (OR PCP TRIAGE): * IF OFFICE WILL BE OPEN: You need to be seen within the next 3 or 4 hours. Call your doctor (or NP/PA) now or as soon as the office opens. BRING MEDICINES: * Please bring a list of your current medicines when you go to see the doctor. CALL BACK IF: * You become worse. CARE ADVICE given per Weakness and Fatigue (Adult) guideline. Comments User: Rudean Haskell, RN Date/Time Eilene Ghazi Time): 02/23/2018 3:27:14 PM HTN. User: Rudean Haskell, RN Date/Time Eilene Ghazi Time): 02/23/2018 3:29:04 PM Hx of kidney stones and UTI's. Referrals [facility] [place]

## 2018-02-26 LAB — ROCKY MTN SPOTTED FVR ABS PNL(IGG+IGM)
RMSF IGG: NEGATIVE
RMSF IgM: 0.23 index (ref 0.00–0.89)

## 2018-02-28 LAB — CULTURE, BLOOD (ROUTINE X 2)
CULTURE: NO GROWTH
Culture: NO GROWTH
SPECIAL REQUESTS: ADEQUATE
Special Requests: ADEQUATE

## 2018-03-01 ENCOUNTER — Encounter: Payer: Self-pay | Admitting: Internal Medicine

## 2018-03-01 ENCOUNTER — Ambulatory Visit (INDEPENDENT_AMBULATORY_CARE_PROVIDER_SITE_OTHER): Payer: PPO | Admitting: Internal Medicine

## 2018-03-01 ENCOUNTER — Other Ambulatory Visit (INDEPENDENT_AMBULATORY_CARE_PROVIDER_SITE_OTHER): Payer: PPO

## 2018-03-01 VITALS — BP 138/88 | HR 92 | Temp 98.0°F | Ht 67.0 in | Wt 154.0 lb

## 2018-03-01 DIAGNOSIS — L03115 Cellulitis of right lower limb: Secondary | ICD-10-CM | POA: Diagnosis not present

## 2018-03-01 DIAGNOSIS — R110 Nausea: Secondary | ICD-10-CM

## 2018-03-01 DIAGNOSIS — L03119 Cellulitis of unspecified part of limb: Secondary | ICD-10-CM | POA: Insufficient documentation

## 2018-03-01 DIAGNOSIS — E1140 Type 2 diabetes mellitus with diabetic neuropathy, unspecified: Secondary | ICD-10-CM | POA: Diagnosis not present

## 2018-03-01 LAB — LIPID PANEL
Cholesterol: 158 mg/dL (ref 0–200)
HDL: 32.2 mg/dL — ABNORMAL LOW (ref >39.00)
LDL Cholesterol: 91 mg/dL (ref 0–99)
NONHDL: 125.51
Total CHOL/HDL Ratio: 5
Triglycerides: 174 mg/dL — ABNORMAL HIGH (ref 0.0–149.0)
VLDL: 34.8 mg/dL (ref 0.0–40.0)

## 2018-03-01 LAB — COMPREHENSIVE METABOLIC PANEL
ALT: 61 U/L — AB (ref 0–53)
AST: 41 U/L — ABNORMAL HIGH (ref 0–37)
Albumin: 3.9 g/dL (ref 3.5–5.2)
Alkaline Phosphatase: 48 U/L (ref 39–117)
BILIRUBIN TOTAL: 0.5 mg/dL (ref 0.2–1.2)
BUN: 30 mg/dL — AB (ref 6–23)
CO2: 32 meq/L (ref 19–32)
Calcium: 9.7 mg/dL (ref 8.4–10.5)
Chloride: 102 mEq/L (ref 96–112)
Creatinine, Ser: 1.31 mg/dL (ref 0.40–1.50)
GFR: 56.25 mL/min — AB (ref >60.00)
GLUCOSE: 143 mg/dL — AB (ref 70–99)
Potassium: 3.5 mEq/L (ref 3.5–5.1)
Sodium: 142 mEq/L (ref 135–145)
Total Protein: 6.7 g/dL (ref 6.0–8.3)

## 2018-03-01 LAB — CBC
HEMATOCRIT: 37.5 % — AB (ref 39.0–52.0)
Hemoglobin: 12.9 g/dL — ABNORMAL LOW (ref 13.0–17.0)
MCHC: 34.4 g/dL (ref 30.0–36.0)
MCV: 87.7 fl (ref 78.0–100.0)
Platelets: 402 10*3/uL — ABNORMAL HIGH (ref 150.0–400.0)
RBC: 4.28 Mil/uL (ref 4.22–5.81)
RDW: 15.3 % (ref 11.5–15.5)
WBC: 6.3 10*3/uL (ref 4.0–10.5)

## 2018-03-01 LAB — LIPASE: Lipase: 25 U/L (ref 11.0–59.0)

## 2018-03-01 LAB — HEMOGLOBIN A1C: HEMOGLOBIN A1C: 7 % — AB (ref 4.6–6.5)

## 2018-03-01 LAB — T4, FREE: Free T4: 1.43 ng/dL (ref 0.60–1.60)

## 2018-03-01 NOTE — Progress Notes (Signed)
Subjective:    Patient ID: James Braun, male    DOB: 02/25/1941, 77 y.o.   MRN: [id number]  HPI Here for ER follow up--6 days ago Had redness on right calf Got really sick--- chills, fatigue, etc No known trauma  Reviewed records Got IV fluids and antibiotics Still on oral antibiotics  No fever Feels better  Still having nausea (light) and then "something wrong in my abdomen" Had chest pain at some point and fluttering feeling Stress test and echo were fine "I think I have digestive problems" Occurs randomly Gets sense that "I am eating way too much" Not always food related---notices it at night in bed at times  Current Outpatient Medications on File Prior to Visit  Medication Sig Dispense Refill  . aspirin 81 MG tablet Take 81 mg by mouth daily.     Marland Kitchen diltiazem (CARDIZEM CD) 240 MG 24 hr capsule Take 1 capsule (240 mg total) by mouth daily. 90 capsule 3  . doxycycline (VIBRAMYCIN) 100 MG capsule Take 1 capsule (100 mg total) by mouth 2 (two) times daily. 20 capsule 0  . finasteride (PROSCAR) 5 MG tablet Take 1 tablet (5 mg total) by mouth daily. 90 tablet 3  . metFORMIN (GLUCOPHAGE-XR) 500 MG 24 hr tablet Take 1 tablet (500 mg total) by mouth 2 (two) times daily. 180 tablet 3  . Multiple Vitamin (MULTIVITAMIN) tablet Take 1 tablet by mouth daily.    . Potassium Citrate 15 MEQ (1620 MG) TBCR TAKE 1 TABLET BY MOUTH 3 TIMES A DAY 270 tablet 1  . Red Yeast Rice Extract (RED YEAST RICE PO) Take 1 tablet by mouth daily.     Marland Kitchen telmisartan (MICARDIS) 80 MG tablet Take 1 tablet (80 mg total) by mouth every morning. 90 tablet 3  . timolol (BETIMOL) 0.25 % ophthalmic solution 1-2 drops 2 (two) times daily.    Marland Kitchen triamterene-hydrochlorothiazide (MAXZIDE-25) 37.5-25 MG tablet Take 1 tablet by mouth daily. 90 tablet 3   No current facility-administered medications on file prior to visit.     Allergies  Allergen Reactions  . Atorvastatin Other (See Comments)    REACTION: myalgias    . Morphine And Related Other (See Comments)     hypotension  . Levofloxacin Rash  . Septra [Sulfamethoxazole-Trimethoprim] Rash  . Sulfamethoxazole Rash    Past Medical History:  Diagnosis Date  . BPH (benign prostatic hypertrophy)   . History of kidney stones   . History of melanoma excision OCT 2010  . Hyperlipidemia   . Hypertension   . Left ureteral calculus   . Nephrolithiasis    bilateral  . Type 2 diabetes mellitus ([place])     Past Surgical History:  Procedure Laterality Date  . CHOLECYSTECTOMY  july 2000  . CYSTOSCOPY WITH STENT PLACEMENT Left 11/14/2012   Procedure: CYSTOSCOPY WITH STENT PLACEMENT;  Surgeon: [name], MD;  Location[facility];  Service: Urology;  Laterality: Left;  . CYSTOSCOPY/RETROGRADE/URETEROSCOPY/STONE EXTRACTION WITH BASKET Left 11/14/2012   Procedure: CYSTOSCOPY/RETROGRADE/URETEROSCOPY/STONE EXTRACTION WITH BASKET;  Surgeon: [name], MD;  [facility];  Service: Urology;  Laterality: Left;  . EXTRACORPOREAL SHOCK WAVE LITHOTRIPSY  JULY 2000   X2  . HOLMIUM LASER APPLICATION Left 01/17/2594   Procedure: HOLMIUM LASER APPLICATION;  Surgeon: [name], MD;  Location: [facility];  Service: Urology;  Laterality: Left;  . LEFT URETEROSCOPIC STONE EXTRACTION  02-22-2007  . NEPHROLITHOTOMY Right 09-28-2010   percutaneous  . ORIF CLAVICLE FRACTURE  2006  . [place]   neg  . SHOULDER ARTHROSCOPY  1/15   Dr [name]  . TONSILLECTOMY      Family History  Problem Relation Age of Onset  . Hypertension Mother   . Diabetes Father   . Hypertension Father   . Coronary artery disease Neg Hx   . Cancer Neg Hx     Social History   Socioeconomic History  . Marital status: Married    Spouse name: Not on file  . Number of children: 2  . Years of education: Not on file  . Highest education level: Not on file  Occupational History  . 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 Occupation: Buys very distressed houses and rehabilitates  . Occupation: Does mediation  Social Needs  . Financial resource strain: Not on file  . Food insecurity:    Worry: Not on file    Inability: Not on file  . Transportation needs:    Medical: Not on file    Non-medical: Not on file  Tobacco Use  . Smoking status: Never Smoker  . Smokeless tobacco: Never Used  Substance and Sexual Activity  . Alcohol use: No  . Drug use: No  . Sexual activity: Not on file  Lifestyle  . Physical activity:    Days per week: Not on file    Minutes per session: Not on file  . Stress: Not on file  Relationships  . Social connections:    Talks on phone: Not on file    Gets together: Not on file    Attends religious service: Not on file    Active member of club or organization: Not on file    Attends meetings of clubs or organizations: Not on file    Relationship status: Not on file  . Intimate partner violence:    Fear of current or ex partner: Not on file    Emotionally abused: Not on file    Physically abused: Not on file    Forced sexual activity: Not on file  Other Topics Concern  . Not on file  Social History Narrative   Has living will   Wife is health care POA---alternate is committee of physicians   Would accept resuscitation---wouldn't accept prolonged ventilation   No tube feeds if cognitively unaware   Review of Systems  Appetite off some Weight is stable Bowels are fine No heartburn or dysphagia    Objective:   Physical Exam  Constitutional: He appears well-developed. No distress.  Neck: No thyromegaly present.  Cardiovascular: Normal rate, regular rhythm and normal heart sounds. Exam reveals no gallop.  No murmur heard. Respiratory: Effort normal and breath sounds normal. No respiratory distress. He has no wheezes. He has no rales.  GI: Soft. He exhibits no distension and no mass. There is no tenderness. There is no rebound and no guarding.    Musculoskeletal: He exhibits no edema.  Lymphadenopathy:    He has no cervical adenopathy.           Assessment & Plan:

## 2018-03-01 NOTE — Addendum Note (Signed)
Addended by: Ellamae Sia on: 03/01/2018 08:34 AM   Modules accepted: Orders

## 2018-03-01 NOTE — Assessment & Plan Note (Signed)
This seems to be completely resolved Likely insect bite as cause (small skin defect noted on anterior right calf) Okay to stop the doxy

## 2018-03-01 NOTE — Patient Instructions (Addendum)
Please take omeprazole 20mg  daily on an empty stomach to see if that helps you symptoms. Stop the doxycycline

## 2018-03-01 NOTE — Assessment & Plan Note (Signed)
And some discomfort Could be gastritis---will check H pylori and trial of PPI choledocholithiasis less likely Pancreatitis? Check lipase also If ongoing symptoms, will check abdominal ultrasound

## 2018-03-05 LAB — H PYLORI, IGM, IGG, IGA AB: H. pylori, IgG AbS: <0.8 Index Value (ref 0.00–0.79)

## 2018-03-06 ENCOUNTER — Encounter: Payer: Self-pay | Admitting: Internal Medicine

## 2018-03-06 ENCOUNTER — Ambulatory Visit (INDEPENDENT_AMBULATORY_CARE_PROVIDER_SITE_OTHER): Payer: PPO | Admitting: Internal Medicine

## 2018-03-06 VITALS — BP 122/70 | HR 60 | Temp 98.0°F | Ht 67.0 in | Wt 157.5 lb

## 2018-03-06 DIAGNOSIS — Z1211 Encounter for screening for malignant neoplasm of colon: Secondary | ICD-10-CM | POA: Diagnosis not present

## 2018-03-06 DIAGNOSIS — E1121 Type 2 diabetes mellitus with diabetic nephropathy: Secondary | ICD-10-CM

## 2018-03-06 DIAGNOSIS — I10 Essential (primary) hypertension: Secondary | ICD-10-CM | POA: Diagnosis not present

## 2018-03-06 DIAGNOSIS — Z7189 Other specified counseling: Secondary | ICD-10-CM | POA: Diagnosis not present

## 2018-03-06 DIAGNOSIS — N183 Chronic kidney disease, stage 3 unspecified: Secondary | ICD-10-CM

## 2018-03-06 DIAGNOSIS — Z0000 Encounter for general adult medical examination without abnormal findings: Secondary | ICD-10-CM | POA: Diagnosis not present

## 2018-03-06 DIAGNOSIS — N400 Enlarged prostate without lower urinary tract symptoms: Secondary | ICD-10-CM

## 2018-03-06 DIAGNOSIS — N1831 Chronic kidney disease, stage 3a: Secondary | ICD-10-CM | POA: Insufficient documentation

## 2018-03-06 LAB — HM DIABETES FOOT EXAM

## 2018-03-06 NOTE — Assessment & Plan Note (Signed)
I have personally reviewed the Medicare Annual Wellness questionnaire and have noted 1. The patient's medical and social history 2. Their use of alcohol, tobacco or illicit drugs 3. Their current medications and supplements 4. The patient's functional ability including ADL's, fall risks, home safety risks and hearing or visual             impairment. 5. Diet and physical activities 6. Evidence for depression or mood disorders  The patients weight, height, BMI and visual acuity have been recorded in the chart I have made referrals, counseling and provided education to the patient based review of the above and I have provided the pt with a written personalized care plan for preventive services.  I have provided you with a copy of your personalized plan for preventive services. Please take the time to review along with your updated medication list.  Yearly flu vaccine Will check FIT till 48 No PSA due to age 16 fit Consider shingrix

## 2018-03-06 NOTE — Assessment & Plan Note (Signed)
BP Readings from Last 3 Encounters:  03/06/18 122/70  03/01/18 138/88  02/23/18 (!) 123/56   Good control

## 2018-03-06 NOTE — Assessment & Plan Note (Signed)
Lab Results  Component Value Date   HGBA1C 7.0 (H) 03/01/2018   Good control Early nephropathy

## 2018-03-06 NOTE — Assessment & Plan Note (Signed)
See social history He is doing his advanced directives

## 2018-03-06 NOTE — Assessment & Plan Note (Signed)
Voids okay on finasteride

## 2018-03-06 NOTE — Assessment & Plan Note (Signed)
Borderline low GFR Is on ARB

## 2018-03-06 NOTE — Patient Instructions (Addendum)
Please stop the omeprazole after the 2 weeks. If you worsen again, restart and take another month before you try going off it again. If you remain okay off it, stay off it. Check with your pharmacist about the shingrix vaccine.

## 2018-03-06 NOTE — Progress Notes (Signed)
Subjective:    Patient ID: James Braun, male    DOB: Feb 02, 1941, 77 y.o.   MRN: [id number]  HPI Here for Medicare wellness visit and follow up of chronic health conditions Reviewed form and advanced directives Reviewed other doctors No alcohol or tobacco Exercises regularly Vision is not great on right---since the cataract Hearing is fine No falls No depression or anhedonia Independent with instrumental ADLs No sig memory problems  Feels better Close to back to normal Is taking the omeprazole No nausea Appetite has really picked up.  Checks sugars rarely No low sugar reactions No foot numbness, pain or ulcers  No chest pain  No SOB No dizziness or syncope No palpitations now No edema  Voiding okay Stream okay on proscar Nocturia 0-1 only  Reviewed labs GFR mostly in high 50's Is on ARB  Current Outpatient Medications on File Prior to Visit  Medication Sig Dispense Refill  . aspirin 81 MG tablet Take 81 mg by mouth daily.     Marland Kitchen diltiazem (CARDIZEM CD) 240 MG 24 hr capsule Take 1 capsule (240 mg total) by mouth daily. 90 capsule 3  . finasteride (PROSCAR) 5 MG tablet Take 1 tablet (5 mg total) by mouth daily. 90 tablet 3  . metFORMIN (GLUCOPHAGE-XR) 500 MG 24 hr tablet Take 1 tablet (500 mg total) by mouth 2 (two) times daily. 180 tablet 3  . Multiple Vitamin (MULTIVITAMIN) tablet Take 1 tablet by mouth daily.    . Potassium Citrate 15 MEQ (1620 MG) TBCR TAKE 1 TABLET BY MOUTH 3 TIMES A DAY 270 tablet 1  . Red Yeast Rice Extract (RED YEAST RICE PO) Take 1 tablet by mouth daily.     Marland Kitchen telmisartan (MICARDIS) 80 MG tablet Take 1 tablet (80 mg total) by mouth every morning. 90 tablet 3  . timolol (BETIMOL) 0.25 % ophthalmic solution 1-2 drops 2 (two) times daily.    Marland Kitchen triamterene-hydrochlorothiazide (MAXZIDE-25) 37.5-25 MG tablet Take 1 tablet by mouth daily. 90 tablet 3   No current facility-administered medications on file prior to visit.     Allergies    Allergen Reactions  . Atorvastatin Other (See Comments)    REACTION: myalgias  . Morphine And Related Other (See Comments)     hypotension  . Levofloxacin Rash  . Septra [Sulfamethoxazole-Trimethoprim] Rash  . Sulfamethoxazole Rash    Past Medical History:  Diagnosis Date  . BPH (benign prostatic hypertrophy)   . History of kidney stones   . History of melanoma excision OCT 2010  . Hyperlipidemia   . Hypertension   . Left ureteral calculus   . Nephrolithiasis    bilateral  . Type 2 diabetes mellitus ([place])     Past Surgical History:  Procedure Laterality Date  . CHOLECYSTECTOMY  july 2000  . CYSTOSCOPY WITH STENT PLACEMENT Left 11/14/2012   Procedure: CYSTOSCOPY WITH STENT PLACEMENT;  Surgeon: [name], MD;  Location: [facility];  Service: Urology;  Laterality: Left;  . CYSTOSCOPY/RETROGRADE/URETEROSCOPY/STONE EXTRACTION WITH BASKET Left 11/14/2012   Procedure: CYSTOSCOPY/RETROGRADE/URETEROSCOPY/STONE EXTRACTION WITH BASKET;  Surgeon: [name], MD;  Location: [facility];  Service: Urology;  Laterality: Left;  . EXTRACORPOREAL SHOCK WAVE LITHOTRIPSY  JULY 2000   X2  . HOLMIUM LASER APPLICATION Left 11/21/8467   Procedure: HOLMIUM LASER APPLICATION;  Surgeon: [name], MD;  Location: [facility];  Service: Urology;  Laterality: Left;  . LEFT URETEROSCOPIC STONE EXTRACTION  02-22-2007  . NEPHROLITHOTOMY Right 09-28-2010  percutaneous  . ORIF CLAVICLE FRACTURE  2006  . PROSTATE BIOPSY  1998   neg  . SHOULDER ARTHROSCOPY  1/15   Dr [name]  . TONSILLECTOMY      Family History  Problem Relation Age of Onset  . Hypertension Mother   . Diabetes Father   . Hypertension Father   . Coronary artery disease Neg Hx   . Cancer Neg Hx     Social History   Socioeconomic History  . Marital status: Married    Spouse name: Not on file  . Number of children: 2  . Years of education: Not on file  . Highest  education level: Not on file  Occupational History  . 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 Occupation: Buys very distressed houses and rehabilitates  . Occupation: Does mediation  Social Needs  . Financial resource strain: Not on file  . Food insecurity:    Worry: Not on file    Inability: Not on file  . Transportation needs:    Medical: Not on file    Non-medical: Not on file  Tobacco Use  . Smoking status: Never Smoker  . Smokeless tobacco: Never Used  Substance and Sexual Activity  . Alcohol use: No  . Drug use: No  . Sexual activity: Not on file  Lifestyle  . Physical activity:    Days per week: Not on file    Minutes per session: Not on file  . Stress: Not on file  Relationships  . Social connections:    Talks on phone: Not on file    Gets together: Not on file    Attends religious service: Not on file    Active member of club or organization: Not on file    Attends meetings of clubs or organizations: Not on file    Relationship status: Not on file  . Intimate partner violence:    Fear of current or ex partner: Not on file    Emotionally abused: Not on file    Physically abused: Not on file    Forced sexual activity: Not on file  Other Topics Concern  . Not on file  Social History Narrative   Has living will   Wife is health care POA---alternate is committee of physicians   Would accept resuscitation---wouldn't accept prolonged ventilation   No tube feeds if cognitively unaware   Review of Systems Weight is up slightly Sleeps well Wears seat belt Teeth are good---keeps up with dentist Keeps up with derm every 6 months--past cancer Bowels are fine--no blood No sig back or joint pain No heartburn in general. No dysphagia     Objective:   Physical Exam  Constitutional: He is oriented to person, place, and time. He appears well-developed. No distress.  HENT:  Mouth/Throat: Oropharynx is clear and moist. No oropharyngeal exudate.  Neck: No  thyromegaly present.  Cardiovascular: Normal rate, regular rhythm, normal heart sounds and intact distal pulses. Exam reveals no gallop.  No murmur heard. Respiratory: Effort normal and breath sounds normal. No respiratory distress. He has no wheezes. He has no rales.  GI: Soft. There is no tenderness.  Musculoskeletal: He exhibits no edema or tenderness.  Lymphadenopathy:    He has no cervical adenopathy.  Neurological: He is alert and oriented to person, place, and time.  President--- "Daisy Floro, [place]---- Bush" [phone] [id number] Recall 3/3  Normal sensation in feet  Skin: No rash noted. No erythema.  No foot lesions  Psychiatric: He has a normal mood  and affect. His behavior is normal.           Assessment & Plan:

## 2018-03-12 DIAGNOSIS — L821 Other seborrheic keratosis: Secondary | ICD-10-CM | POA: Diagnosis not present

## 2018-03-12 DIAGNOSIS — X32XXXD Exposure to sunlight, subsequent encounter: Secondary | ICD-10-CM | POA: Diagnosis not present

## 2018-03-12 DIAGNOSIS — Z85820 Personal history of malignant melanoma of skin: Secondary | ICD-10-CM | POA: Diagnosis not present

## 2018-03-12 DIAGNOSIS — Z08 Encounter for follow-up examination after completed treatment for malignant neoplasm: Secondary | ICD-10-CM | POA: Diagnosis not present

## 2018-03-12 DIAGNOSIS — Z1283 Encounter for screening for malignant neoplasm of skin: Secondary | ICD-10-CM | POA: Diagnosis not present

## 2018-03-12 DIAGNOSIS — L820 Inflamed seborrheic keratosis: Secondary | ICD-10-CM | POA: Diagnosis not present

## 2018-03-12 DIAGNOSIS — L570 Actinic keratosis: Secondary | ICD-10-CM | POA: Diagnosis not present

## 2018-03-14 ENCOUNTER — Ambulatory Visit: Payer: PPO | Admitting: Podiatry

## 2018-03-14 ENCOUNTER — Encounter: Payer: Self-pay | Admitting: Podiatry

## 2018-03-14 DIAGNOSIS — M79676 Pain in unspecified toe(s): Secondary | ICD-10-CM | POA: Diagnosis not present

## 2018-03-14 DIAGNOSIS — E1140 Type 2 diabetes mellitus with diabetic neuropathy, unspecified: Secondary | ICD-10-CM | POA: Diagnosis not present

## 2018-03-14 DIAGNOSIS — Q828 Other specified congenital malformations of skin: Secondary | ICD-10-CM | POA: Diagnosis not present

## 2018-03-14 DIAGNOSIS — B351 Tinea unguium: Secondary | ICD-10-CM

## 2018-03-14 NOTE — Progress Notes (Signed)
Patient ID: James Braun, male   DOB: 1941/07/16, 77 y.o.   MRN: [id number]   Complaint:  Visit Type: Patient returns to my office for continued preventative foot care services. Complaint: Patient states" my nails have grown long and thick and become painful to walk and wear shoes" Patient has been diagnosed with DM with no preulcerative lesions and hammer toes . The patient presents for preventative foot care services. No changes to ROS.  Patient likes his new diabetic shoes.  Podiatric Exam: Vascular: dorsalis pedis and posterior tibial pulses are palpable bilateral. Capillary return is immediate. Temperature gradient is WNL. Skin turgor WNL  Sensorium: Diminished  Semmes Weinstein monofilament test. Normal tactile sensation bilaterally. Nail Exam: Pt has thick disfigured discolored nails with subungual debris noted bilateral entire nail hallux through fifth toenails Ulcer Exam: There is no evidence of ulcer or pre-ulcerative changes or infection. Orthopedic Exam: Muscle tone and strength are WNL. No limitations in general ROM. No crepitus or effusions noted. Foot type and digits show no abnormalities.Hammer toes  B/L Skin:  Porokeratosis sub ,5 B/L  and heel left  foot. No infection or ulcers  Diagnosis:  Onychomycosis, , Pain in right toe, pain in left toes. Porokeratosis  Left foot  Treatment & Plan Procedures and Treatment: Consent by patient was obtained for treatment procedures. The patient understood the discussion of treatment and procedures well. All questions were answered thoroughly reviewed. Debridement of mycotic and hypertrophic toenails, 1 through 5 bilateral and clearing of subungual debris. No ulceration, no infection noted. Debride porokeratosis B/L.  Return Visit-Office Procedure: Patient instructed to return to the office for a follow up visit 3 months for continued evaluation and treatment.     Gardiner Barefoot DPM

## 2018-03-15 ENCOUNTER — Other Ambulatory Visit (INDEPENDENT_AMBULATORY_CARE_PROVIDER_SITE_OTHER): Payer: PPO

## 2018-03-15 DIAGNOSIS — Z1211 Encounter for screening for malignant neoplasm of colon: Secondary | ICD-10-CM | POA: Diagnosis not present

## 2018-03-15 LAB — FECAL OCCULT BLOOD, IMMUNOCHEMICAL: Fecal Occult Bld: NEGATIVE

## 2018-04-03 ENCOUNTER — Encounter: Payer: Self-pay | Admitting: Cardiovascular Disease

## 2018-04-03 ENCOUNTER — Ambulatory Visit: Payer: PPO | Admitting: Cardiovascular Disease

## 2018-04-03 DIAGNOSIS — I208 Other forms of angina pectoris: Secondary | ICD-10-CM | POA: Diagnosis not present

## 2018-04-03 NOTE — Progress Notes (Signed)
Mr. Findling returns today for follow-up of his outpatient diagnostic studies performed 6/24 and 01/09/2018 for chest pain.  His echo was entirely normal as was a stress test.  His chest pain has since resolved.  He does have a full feeling after eating and certainly possible that his symptoms are gastrointestinal in nature.  I have reassured him I will see him back in 6 months for follow-up.  [name], M.D., [place], [facility], [place] [address] [place], [place]  [address]  [phone] 04/03/2018 11:38 AM

## 2018-04-03 NOTE — Assessment & Plan Note (Signed)
Mr. Kolton returns today for follow-up of his outpatient diagnostic studies performed 6/24 and 01/09/2018 for chest pain.  His echo was entirely normal as was a stress test.  His chest pain has since resolved.  He does have a full feeling after eating and certainly possible that his symptoms are gastrointestinal in nature.  I have reassured him I will see him back in 6 months for follow-up.

## 2018-04-03 NOTE — Patient Instructions (Addendum)
Medication Instructions:  Your physician recommends that you continue on your current medications as directed. Please refer to the Current Medication list given to you today.   Labwork: none  Testing/Procedures: none  Follow-Up: Your physician recommends that you schedule a follow-up appointment in: 6 months with Dr. [name]    Any Other Special Instructions Will Be Listed Below (If Applicable).     If you need a refill on your cardiac medications before your next appointment, please call your pharmacy.

## 2018-05-27 ENCOUNTER — Telehealth: Payer: Self-pay | Admitting: Internal Medicine

## 2018-05-27 NOTE — Telephone Encounter (Signed)
Left message on VM per DPR advising them Dr [name] does not prefer one over the other. He just likes to make pts aware that with the high dose vaccines comes the higher risk of side effects like fever. Advised them they did not need a rx from [place] to get it at the local pharmacy.

## 2018-05-27 NOTE — Telephone Encounter (Signed)
Pt came in today wanting to know if dr [name] recommended him to get high dose flu vaccine or the regular vaccine  Best number [phone]  If high dose is recommended  And if we don't have this please send to [facility]

## 2018-05-30 ENCOUNTER — Ambulatory Visit (INDEPENDENT_AMBULATORY_CARE_PROVIDER_SITE_OTHER): Payer: PPO | Admitting: Internal Medicine

## 2018-05-30 DIAGNOSIS — Z23 Encounter for immunization: Secondary | ICD-10-CM

## 2018-06-03 DIAGNOSIS — H40003 Preglaucoma, unspecified, bilateral: Secondary | ICD-10-CM | POA: Diagnosis not present

## 2018-06-03 LAB — HM DIABETES EYE EXAM

## 2018-06-08 ENCOUNTER — Other Ambulatory Visit: Payer: Self-pay | Admitting: Internal Medicine

## 2018-06-17 ENCOUNTER — Encounter: Payer: Self-pay | Admitting: Podiatry

## 2018-06-17 ENCOUNTER — Ambulatory Visit: Payer: PPO | Admitting: Podiatry

## 2018-06-17 DIAGNOSIS — E1140 Type 2 diabetes mellitus with diabetic neuropathy, unspecified: Secondary | ICD-10-CM | POA: Diagnosis not present

## 2018-06-17 DIAGNOSIS — Q828 Other specified congenital malformations of skin: Secondary | ICD-10-CM | POA: Diagnosis not present

## 2018-06-17 DIAGNOSIS — B351 Tinea unguium: Secondary | ICD-10-CM | POA: Diagnosis not present

## 2018-06-17 DIAGNOSIS — M79676 Pain in unspecified toe(s): Secondary | ICD-10-CM | POA: Diagnosis not present

## 2018-06-17 NOTE — Progress Notes (Signed)
Patient ID: James Braun, male   DOB: 1940-10-29, 77 y.o.   MRN: [id number]   Complaint:  Visit Type: Patient returns to my office for continued preventative foot care services. Complaint: Patient states" my nails have grown long and thick and become painful to walk and wear shoes" Patient has been diagnosed with DM with  preulcerative lesions and hammer toes . The patient presents for preventative foot care services. No changes to ROS.    Podiatric Exam: Vascular: dorsalis pedis and posterior tibial pulses are palpable bilateral. Capillary return is immediate. Temperature gradient is WNL. Skin turgor WNL  Sensorium: Diminished  Semmes Weinstein monofilament test. Normal tactile sensation bilaterally. Nail Exam: Pt has thick disfigured discolored nails with subungual debris noted bilateral entire nail hallux through fifth toenails Ulcer Exam: There is no evidence of ulcer or pre-ulcerative changes or infection. Orthopedic Exam: Muscle tone and strength are WNL. No limitations in general ROM. No crepitus or effusions noted. Foot type and digits show no abnormalities.Hammer toes  B/L Skin:  Porokeratosis sub ,5 B/L  and heel left  foot. No infection or ulcers.  Porokeratosis 1st MPJ left foot.  Diagnosis:  Onychomycosis, , Pain in right toe, pain in left toes. Porokeratosis  B/L  Treatment & Plan Procedures and Treatment: Consent by patient was obtained for treatment procedures. The patient understood the discussion of treatment and procedures well. All questions were answered thoroughly reviewed. Debridement of mycotic and hypertrophic toenails, 1 through 5 bilateral and clearing of subungual debris. No ulceration, no infection noted. Debride porokeratosis B/L. Patient desires new diabetic shoes next visit. Return Visit-Office Procedure: Patient instructed to return to the office for a follow up visit 3 months for continued evaluation and treatment.     Gardiner Barefoot DPM

## 2018-07-05 DIAGNOSIS — N200 Calculus of kidney: Secondary | ICD-10-CM | POA: Diagnosis not present

## 2018-07-05 DIAGNOSIS — N402 Nodular prostate without lower urinary tract symptoms: Secondary | ICD-10-CM | POA: Diagnosis not present

## 2018-07-24 DIAGNOSIS — L570 Actinic keratosis: Secondary | ICD-10-CM | POA: Diagnosis not present

## 2018-07-24 DIAGNOSIS — L820 Inflamed seborrheic keratosis: Secondary | ICD-10-CM | POA: Diagnosis not present

## 2018-07-24 DIAGNOSIS — L308 Other specified dermatitis: Secondary | ICD-10-CM | POA: Diagnosis not present

## 2018-07-24 DIAGNOSIS — X32XXXD Exposure to sunlight, subsequent encounter: Secondary | ICD-10-CM | POA: Diagnosis not present

## 2018-08-01 ENCOUNTER — Encounter: Payer: Self-pay | Admitting: Emergency Medicine

## 2018-08-28 ENCOUNTER — Other Ambulatory Visit: Payer: Self-pay | Admitting: Internal Medicine

## 2018-09-02 ENCOUNTER — Other Ambulatory Visit: Payer: PPO

## 2018-09-05 ENCOUNTER — Encounter: Payer: Self-pay | Admitting: Internal Medicine

## 2018-09-05 ENCOUNTER — Ambulatory Visit (INDEPENDENT_AMBULATORY_CARE_PROVIDER_SITE_OTHER): Payer: PPO | Admitting: Internal Medicine

## 2018-09-05 VITALS — BP 136/80 | HR 74 | Temp 98.0°F | Ht 70.0 in | Wt 161.0 lb

## 2018-09-05 DIAGNOSIS — N183 Chronic kidney disease, stage 3 unspecified: Secondary | ICD-10-CM

## 2018-09-05 DIAGNOSIS — E1121 Type 2 diabetes mellitus with diabetic nephropathy: Secondary | ICD-10-CM

## 2018-09-05 DIAGNOSIS — N189 Chronic kidney disease, unspecified: Secondary | ICD-10-CM

## 2018-09-05 DIAGNOSIS — D631 Anemia in chronic kidney disease: Secondary | ICD-10-CM | POA: Diagnosis not present

## 2018-09-05 LAB — POCT GLYCOSYLATED HEMOGLOBIN (HGB A1C): HEMOGLOBIN A1C: 6.8 % — AB (ref 4.0–5.6)

## 2018-09-05 MED ORDER — TELMISARTAN 80 MG PO TABS: 80.0000 mg | ORAL_TABLET | Freq: Every morning | ORAL | 3 refills | Status: DC

## 2018-09-05 MED ORDER — TRIAMTERENE-HCTZ 37.5-25 MG PO TABS: 1.0000 | ORAL_TABLET | Freq: Every day | ORAL | 3 refills | Status: DC

## 2018-09-05 MED ORDER — METFORMIN HCL ER 500 MG PO TB24: 500.0000 mg | ORAL_TABLET | Freq: Two times a day (BID) | ORAL | 3 refills | Status: DC

## 2018-09-05 MED ORDER — FINASTERIDE 5 MG PO TABS: 5.0000 mg | ORAL_TABLET | Freq: Every day | ORAL | 3 refills | Status: DC

## 2018-09-05 MED ORDER — DILTIAZEM HCL ER COATED BEADS 240 MG PO CP24: 240.0000 mg | ORAL_CAPSULE | Freq: Every day | ORAL | 3 refills | Status: DC

## 2018-09-05 MED ORDER — SILDENAFIL CITRATE 20 MG PO TABS: 60.0000 mg | ORAL_TABLET | Freq: Every day | ORAL | 11 refills | Status: DC | PRN

## 2018-09-05 NOTE — Assessment & Plan Note (Signed)
On ARB Will check at next visit

## 2018-09-05 NOTE — Assessment & Plan Note (Signed)
Lab Results  Component Value Date   HGBA1C 6.8 (A) 09/05/2018   Great control No changes needed

## 2018-09-05 NOTE — Assessment & Plan Note (Signed)
Mild No action needed

## 2018-09-05 NOTE — Progress Notes (Addendum)
Subjective:    Patient ID: James Braun, male    DOB: 1940-11-04, 78 y.o.   MRN: [id number]  HPI Here for follow up of diabetes and other chronic medical conditions  Has been working hard on eating right and exercising Fasts with only 600 calories a day for 2 days in a row/week Checks sugars rarely No numbness or tingling in feet  Mild CKD with GFR in 50's Also had slight anemia   Current Outpatient Medications on File Prior to Visit  Medication Sig Dispense Refill  . aspirin 81 MG tablet Take 81 mg by mouth daily.     Marland Kitchen diltiazem (CARDIZEM CD) 240 MG 24 hr capsule Take 1 capsule (240 mg total) by mouth daily. 90 capsule 3  . finasteride (PROSCAR) 5 MG tablet Take 1 tablet (5 mg total) by mouth daily. 90 tablet 3  . metFORMIN (GLUCOPHAGE-XR) 500 MG 24 hr tablet Take 1 tablet (500 mg total) by mouth 2 (two) times daily. 180 tablet 3  . Multiple Vitamin (MULTIVITAMIN) tablet Take 1 tablet by mouth daily.    . Potassium Citrate 15 MEQ (1620 MG) TBCR TAKE 1 TABLET BY MOUTH 3 TIMES A DAY 270 tablet 3  . Red Yeast Rice Extract (RED YEAST RICE PO) Take 1 tablet by mouth daily.     Marland Kitchen telmisartan (MICARDIS) 80 MG tablet Take 1 tablet (80 mg total) by mouth every morning. 90 tablet 3  . timolol (BETIMOL) 0.25 % ophthalmic solution 1-2 drops 2 (two) times daily.    Marland Kitchen triamterene-hydrochlorothiazide (MAXZIDE-25) 37.5-25 MG tablet Take 1 tablet by mouth daily. 90 tablet 3   No current facility-administered medications on file prior to visit.     Allergies  Allergen Reactions  . Atorvastatin Other (See Comments)    REACTION: myalgias  . Morphine And Related Other (See Comments)     hypotension  . Levofloxacin Rash  . Septra [Sulfamethoxazole-Trimethoprim] Rash  . Sulfamethoxazole Rash    Past Medical History:  Diagnosis Date  . BPH (benign prostatic hypertrophy)   . History of kidney stones   . History of melanoma excision OCT 2010  . Hyperlipidemia   . Hypertension   . Left  ureteral calculus   . Nephrolithiasis    bilateral  . Type 2 diabetes mellitus ([employer])     Past Surgical History:  Procedure Laterality Date  . CHOLECYSTECTOMY  july 2000  . CYSTOSCOPY WITH STENT PLACEMENT Left 11/14/2012   Procedure: CYSTOSCOPY WITH STENT PLACEMENT;  Surgeon: [name], MD;  Location[facility];  Service: Urology;  Laterality: Left;  . CYSTOSCOPY/RETROGRADE/URETEROSCOPY/STONE EXTRACTION WITH BASKET Left 11/14/2012   Procedure: CYSTOSCOPY/RETROGRADE/URETEROSCOPY/STONE EXTRACTION WITH BASKET;  Surgeon: [name], MD;  Location: [facility];  Service: Urology;  Laterality: Left;  . EXTRACORPOREAL SHOCK WAVE LITHOTRIPSY  JULY 2000   X2  . HOLMIUM LASER APPLICATION Left 12/17/1306   Procedure: HOLMIUM LASER APPLICATION;  Surgeon: [name], MD;  Location: [facility];  Service: Urology;  Laterality: Left;  . LEFT URETEROSCOPIC STONE EXTRACTION  02-22-2007  . NEPHROLITHOTOMY Right 09-28-2010   percutaneous  . ORIF CLAVICLE FRACTURE  2006  . PROSTATE BIOPSY  1998   neg  . SHOULDER ARTHROSCOPY  1/15   Dr [name]  . TONSILLECTOMY      Family History  Problem Relation Age of Onset  . Hypertension Mother   . Diabetes Father   . Hypertension Father   . Coronary artery disease Neg Hx   .  Cancer Neg Hx     Social History   Socioeconomic History  . Marital status: Married    Spouse name: Not on file  . Number of children: 2  . Years of education: Not on file  . Highest education level: Not on file  Occupational History  . 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 Occupation: Buys very distressed houses and rehabilitates  . Occupation: Does mediation  Social Needs  . Financial resource strain: Not on file  . Food insecurity:    Worry: Not on file    Inability: Not on file  . Transportation needs:    Medical: Not on file    Non-medical: Not on file  Tobacco Use  . Smoking status: Never Smoker    . Smokeless tobacco: Never Used  Substance and Sexual Activity  . Alcohol use: No  . Drug use: No  . Sexual activity: Not on file  Lifestyle  . Physical activity:    Days per week: Not on file    Minutes per session: Not on file  . Stress: Not on file  Relationships  . Social connections:    Talks on phone: Not on file    Gets together: Not on file    Attends religious service: Not on file    Active member of club or organization: Not on file    Attends meetings of clubs or organizations: Not on file    Relationship status: Not on file  . Intimate partner violence:    Fear of current or ex partner: Not on file    Emotionally abused: Not on file    Physically abused: Not on file    Forced sexual activity: Not on file  Other Topics Concern  . Not on file  Social History Narrative   Has living will   Wife is health care POA---alternate is committee of physicians   Would accept resuscitation---wouldn't accept prolonged ventilation   No tube feeds if cognitively unaware   Review of Systems Is interested in a refill for sildenafil--this has helped Appetite is good Weight is stable Sleeping really well---usually no nocturia     Objective:   Physical Exam  Constitutional: He appears well-developed. No distress.  Neck: No thyromegaly present.  Cardiovascular: Normal rate, regular rhythm and intact distal pulses. Exam reveals no gallop.  Soft aortic systolic murmur  Respiratory: Effort normal and breath sounds normal. No respiratory distress. He has no wheezes. He has no rales.  Musculoskeletal:        General: No tenderness or edema.  Lymphadenopathy:    He has no cervical adenopathy.  Skin:  No foot lesions  Psychiatric: He has a normal mood and affect. His behavior is normal.           Assessment & Plan:

## 2018-09-06 ENCOUNTER — Encounter: Payer: Self-pay | Admitting: Internal Medicine

## 2018-09-16 ENCOUNTER — Ambulatory Visit: Payer: PPO | Admitting: Podiatry

## 2018-09-16 ENCOUNTER — Encounter: Payer: Self-pay | Admitting: Podiatry

## 2018-09-16 DIAGNOSIS — E1140 Type 2 diabetes mellitus with diabetic neuropathy, unspecified: Secondary | ICD-10-CM | POA: Diagnosis not present

## 2018-09-16 DIAGNOSIS — M79676 Pain in unspecified toe(s): Secondary | ICD-10-CM

## 2018-09-16 DIAGNOSIS — B351 Tinea unguium: Secondary | ICD-10-CM | POA: Diagnosis not present

## 2018-09-16 DIAGNOSIS — Q828 Other specified congenital malformations of skin: Secondary | ICD-10-CM | POA: Diagnosis not present

## 2018-09-16 NOTE — Progress Notes (Signed)
Patient ID: James Braun, male   DOB: 13-Jun-1941, 78 y.o.   MRN: [id number]   Complaint:  Visit Type: Patient returns to my office for continued preventative foot care services. Complaint: Patient states" my nails have grown long and thick and become painful to walk and wear shoes" Patient has been diagnosed with DM with  preulcerative lesions and hammer toes . The patient presents for preventative foot care services. No changes to ROS.    Podiatric Exam: Vascular: dorsalis pedis and posterior tibial pulses are palpable bilateral. Capillary return is immediate. Temperature gradient is WNL. Skin turgor WNL  Sensorium: Diminished  Semmes Weinstein monofilament test. Normal tactile sensation bilaterally. Nail Exam: Pt has thick disfigured discolored nails with subungual debris noted bilateral entire nail hallux through fifth toenails Ulcer Exam: There is no evidence of ulcer or pre-ulcerative changes or infection. Orthopedic Exam: Muscle tone and strength are WNL. No limitations in general ROM. No crepitus or effusions noted. Foot type and digits show no abnormalities.Hammer toes  B/L Skin:  Porokeratosis sub 5 B/L  and heel left  foot. No infection or ulcers.  Porokeratosis 1st MPJ left foot.  Diagnosis:  Onychomycosis, , Pain in right toe, pain in left toes. Porokeratosis  B/L  Treatment & Plan Procedures and Treatment: Consent by patient was obtained for treatment procedures. The patient understood the discussion of treatment and procedures well. All questions were answered thoroughly reviewed. Debridement of mycotic and hypertrophic toenails, 1 through 5 bilateral and clearing of subungual debris. No ulceration, no infection noted. Debride porokeratosis B/L. Qualifies for diabetic shoes due to DPN and hammer toes and pre-ulcerative lesions. Patient to make an appointment with [name]. Return Visit-Office Procedure: Patient instructed to return to the office for a follow up visit 3 months for  continued evaluation and treatment.     Gardiner Barefoot DPM

## 2018-10-02 ENCOUNTER — Ambulatory Visit: Payer: PPO | Admitting: Orthotics

## 2018-10-02 ENCOUNTER — Other Ambulatory Visit: Payer: Self-pay

## 2018-10-02 DIAGNOSIS — M2041 Other hammer toe(s) (acquired), right foot: Secondary | ICD-10-CM

## 2018-10-02 DIAGNOSIS — M79676 Pain in unspecified toe(s): Principal | ICD-10-CM

## 2018-10-02 DIAGNOSIS — E1140 Type 2 diabetes mellitus with diabetic neuropathy, unspecified: Secondary | ICD-10-CM

## 2018-10-02 DIAGNOSIS — B351 Tinea unguium: Secondary | ICD-10-CM

## 2018-10-02 DIAGNOSIS — E1142 Type 2 diabetes mellitus with diabetic polyneuropathy: Secondary | ICD-10-CM

## 2018-10-02 DIAGNOSIS — M2042 Other hammer toe(s) (acquired), left foot: Secondary | ICD-10-CM

## 2018-10-02 NOTE — Progress Notes (Signed)

## 2018-11-04 ENCOUNTER — Other Ambulatory Visit: Payer: Self-pay

## 2018-11-04 ENCOUNTER — Ambulatory Visit (INDEPENDENT_AMBULATORY_CARE_PROVIDER_SITE_OTHER): Payer: PPO | Admitting: Orthotics

## 2018-11-04 VITALS — Temp 97.9°F

## 2018-11-04 DIAGNOSIS — M2042 Other hammer toe(s) (acquired), left foot: Secondary | ICD-10-CM | POA: Diagnosis not present

## 2018-11-04 DIAGNOSIS — B351 Tinea unguium: Secondary | ICD-10-CM

## 2018-11-04 DIAGNOSIS — M79676 Pain in unspecified toe(s): Secondary | ICD-10-CM | POA: Diagnosis not present

## 2018-11-04 DIAGNOSIS — M2041 Other hammer toe(s) (acquired), right foot: Secondary | ICD-10-CM | POA: Diagnosis not present

## 2018-11-04 DIAGNOSIS — E1142 Type 2 diabetes mellitus with diabetic polyneuropathy: Secondary | ICD-10-CM

## 2018-11-04 DIAGNOSIS — E1140 Type 2 diabetes mellitus with diabetic neuropathy, unspecified: Secondary | ICD-10-CM | POA: Diagnosis not present

## 2018-11-04 NOTE — Progress Notes (Signed)

## 2018-11-18 ENCOUNTER — Ambulatory Visit: Payer: PPO | Admitting: Podiatry

## 2018-11-28 ENCOUNTER — Other Ambulatory Visit: Payer: Self-pay

## 2018-11-28 ENCOUNTER — Ambulatory Visit: Payer: PPO | Admitting: Podiatry

## 2018-11-28 ENCOUNTER — Encounter: Payer: Self-pay | Admitting: Podiatry

## 2018-11-28 VITALS — Temp 98.0°F

## 2018-11-28 DIAGNOSIS — B351 Tinea unguium: Secondary | ICD-10-CM

## 2018-11-28 DIAGNOSIS — M79676 Pain in unspecified toe(s): Secondary | ICD-10-CM | POA: Diagnosis not present

## 2018-11-28 DIAGNOSIS — Q828 Other specified congenital malformations of skin: Secondary | ICD-10-CM

## 2018-11-28 DIAGNOSIS — E1142 Type 2 diabetes mellitus with diabetic polyneuropathy: Secondary | ICD-10-CM | POA: Diagnosis not present

## 2018-11-28 NOTE — Progress Notes (Signed)
Patient ID: James Braun, male   DOB: 25-Apr-1941, 78 y.o.   MRN: [id number]   Complaint:  Visit Type: Patient returns to my office for continued preventative foot care services. Complaint: Patient states" my nails have grown long and thick and become painful to walk and wear shoes" Patient has been diagnosed with DM with  preulcerative lesions and hammer toes . The patient presents for preventative foot care services. No changes to ROS.    Podiatric Exam: Vascular: dorsalis pedis and posterior tibial pulses are palpable bilateral. Capillary return is immediate. Temperature gradient is WNL. Skin turgor WNL  Sensorium: Diminished  Semmes Weinstein monofilament test. Normal tactile sensation bilaterally. Nail Exam: Pt has thick disfigured discolored nails with subungual debris noted bilateral entire nail hallux through fifth toenails Ulcer Exam: There is no evidence of ulcer or pre-ulcerative changes or infection. Orthopedic Exam: Muscle tone and strength are WNL. No limitations in general ROM. No crepitus or effusions noted. Foot type and digits show no abnormalities.Hammer toes  B/L Skin:  Porokeratosis sub 5 B/L  and heel left  foot. No infection or ulcers.  Porokeratosis 1st MPJ left foot.  Diagnosis:  Onychomycosis, , Pain in right toe, pain in left toes. Porokeratosis  B/L  Treatment & Plan Procedures and Treatment: Consent by patient was obtained for treatment procedures. The patient understood the discussion of treatment and procedures well. All questions were answered thoroughly reviewed. Debridement of mycotic and hypertrophic toenails, 1 through 5 bilateral and clearing of subungual debris. No ulceration, no infection noted. Debride porokeratosis B/L. Return Visit-Office Procedure: Patient instructed to return to the office for a follow up visit 3 months for continued evaluation and treatment.     Gardiner Barefoot DPM

## 2018-12-30 DIAGNOSIS — H40003 Preglaucoma, unspecified, bilateral: Secondary | ICD-10-CM | POA: Diagnosis not present

## 2018-12-30 LAB — HM DIABETES EYE EXAM

## 2019-01-08 ENCOUNTER — Ambulatory Visit: Payer: PPO | Admitting: Family Medicine

## 2019-01-08 ENCOUNTER — Encounter: Payer: Self-pay | Admitting: Family Medicine

## 2019-02-26 ENCOUNTER — Other Ambulatory Visit: Payer: Self-pay | Admitting: Internal Medicine

## 2019-02-26 DIAGNOSIS — E1121 Type 2 diabetes mellitus with diabetic nephropathy: Secondary | ICD-10-CM

## 2019-03-03 ENCOUNTER — Encounter: Payer: Self-pay | Admitting: Podiatry

## 2019-03-03 ENCOUNTER — Other Ambulatory Visit: Payer: Self-pay | Admitting: Internal Medicine

## 2019-03-03 ENCOUNTER — Ambulatory Visit: Payer: PPO | Admitting: Podiatry

## 2019-03-03 ENCOUNTER — Other Ambulatory Visit: Payer: Self-pay

## 2019-03-03 VITALS — Temp 98.4°F

## 2019-03-03 DIAGNOSIS — B351 Tinea unguium: Secondary | ICD-10-CM

## 2019-03-03 DIAGNOSIS — Q828 Other specified congenital malformations of skin: Secondary | ICD-10-CM | POA: Diagnosis not present

## 2019-03-03 DIAGNOSIS — M79676 Pain in unspecified toe(s): Secondary | ICD-10-CM

## 2019-03-03 DIAGNOSIS — E1142 Type 2 diabetes mellitus with diabetic polyneuropathy: Secondary | ICD-10-CM

## 2019-03-03 DIAGNOSIS — N400 Enlarged prostate without lower urinary tract symptoms: Secondary | ICD-10-CM

## 2019-03-03 NOTE — Progress Notes (Signed)
Patient ID: James Braun, male   DOB: 08/17/1940, 78 y.o.   MRN: [id number]   Complaint:  Visit Type: Patient returns to my office for continued preventative foot care services. Complaint: Patient states" my nails have grown long and thick and become painful to walk and wear shoes" Patient has been diagnosed with DM with  preulcerative lesions and hammer toes . The patient presents for preventative foot care services. No changes to ROS.    Podiatric Exam: Vascular: dorsalis pedis and posterior tibial pulses are palpable bilateral. Capillary return is immediate. Temperature gradient is WNL. Skin turgor WNL  Sensorium: Diminished  Semmes Weinstein monofilament test. Normal tactile sensation bilaterally. Nail Exam: Pt has thick disfigured discolored nails with subungual debris noted bilateral entire nail hallux through fifth toenails Ulcer Exam: There is no evidence of ulcer or pre-ulcerative changes or infection. Orthopedic Exam: Muscle tone and strength are WNL. No limitations in general ROM. No crepitus or effusions noted. Foot type and digits show no abnormalities.Hammer toes  B/L Skin:  Porokeratosis sub 5 B/L  and heel left  foot. No infection or ulcers.  Porokeratosis 1st MPJ left foot.  Diagnosis:  Onychomycosis, , Pain in right toe, pain in left toes. Porokeratosis  B/L  Treatment & Plan Procedures and Treatment: Consent by patient was obtained for treatment procedures. The patient understood the discussion of treatment and procedures well. All questions were answered thoroughly reviewed. Debridement of mycotic and hypertrophic toenails, 1 through 5 bilateral and clearing of subungual debris. No ulceration, no infection noted. Debride porokeratosis B/L. Return Visit-Office Procedure: Patient instructed to return to the office for a follow up visit 3 months for continued evaluation and treatment.     Gardiner Barefoot DPM

## 2019-03-04 ENCOUNTER — Encounter: Payer: Self-pay | Admitting: Internal Medicine

## 2019-03-04 ENCOUNTER — Telehealth: Payer: Self-pay

## 2019-03-04 DIAGNOSIS — Z0279 Encounter for issue of other medical certificate: Secondary | ICD-10-CM

## 2019-03-04 NOTE — Telephone Encounter (Signed)
pts wife (DPR signed) left v/m requesting a letter for pt to go to gym for exercising. Pt can pick up letter on 03/05/19 when has lab appt and pt has annual exam scheduled for 03/12/19.Please advise. pts wife request cb when letter ready for pickup.

## 2019-03-04 NOTE — Telephone Encounter (Signed)
Patient notified letter ready for pick up and $20 charge. °

## 2019-03-04 NOTE — Telephone Encounter (Signed)
Letter done $20 charge 

## 2019-03-05 ENCOUNTER — Other Ambulatory Visit: Payer: Self-pay

## 2019-03-05 ENCOUNTER — Other Ambulatory Visit (INDEPENDENT_AMBULATORY_CARE_PROVIDER_SITE_OTHER): Payer: PPO

## 2019-03-05 ENCOUNTER — Other Ambulatory Visit: Payer: Self-pay | Admitting: Internal Medicine

## 2019-03-05 DIAGNOSIS — N400 Enlarged prostate without lower urinary tract symptoms: Secondary | ICD-10-CM | POA: Diagnosis not present

## 2019-03-05 DIAGNOSIS — E1121 Type 2 diabetes mellitus with diabetic nephropathy: Secondary | ICD-10-CM | POA: Diagnosis not present

## 2019-03-05 LAB — CBC
HCT: 36.5 % — ABNORMAL LOW (ref 39.0–52.0)
Hemoglobin: 11.7 g/dL — ABNORMAL LOW (ref 13.0–17.0)
MCHC: 32.2 g/dL (ref 30.0–36.0)
MCV: 81.6 fl (ref 78.0–100.0)
Platelets: 346 10*3/uL (ref 150.0–400.0)
RBC: 4.47 Mil/uL (ref 4.22–5.81)
RDW: 18 % — ABNORMAL HIGH (ref 11.5–15.5)
WBC: 4.5 10*3/uL (ref 4.0–10.5)

## 2019-03-05 LAB — LIPID PANEL
Cholesterol: 192 mg/dL (ref 0–200)
HDL: 37.2 mg/dL — ABNORMAL LOW (ref >39.00)
LDL Cholesterol: 121 mg/dL — ABNORMAL HIGH (ref 0–99)
NonHDL: 155.2
Total CHOL/HDL Ratio: 5
Triglycerides: 172 mg/dL — ABNORMAL HIGH (ref 0.0–149.0)
VLDL: 34.4 mg/dL (ref 0.0–40.0)

## 2019-03-05 LAB — COMPREHENSIVE METABOLIC PANEL
ALT: 16 U/L (ref 0–53)
AST: 16 U/L (ref 0–37)
Albumin: 4.2 g/dL (ref 3.5–5.2)
Alkaline Phosphatase: 50 U/L (ref 39–117)
BUN: 27 mg/dL — ABNORMAL HIGH (ref 6–23)
CO2: 29 mEq/L (ref 19–32)
Calcium: 8.8 mg/dL (ref 8.4–10.5)
Chloride: 103 mEq/L (ref 96–112)
Creatinine, Ser: 1.35 mg/dL (ref 0.40–1.50)
GFR: 50.98 mL/min — ABNORMAL LOW (ref >60.00)
Glucose, Bld: 135 mg/dL — ABNORMAL HIGH (ref 70–99)
Potassium: 3.3 mEq/L — ABNORMAL LOW (ref 3.5–5.1)
Sodium: 141 mEq/L (ref 135–145)
Total Bilirubin: 0.3 mg/dL (ref 0.2–1.2)
Total Protein: 6.3 g/dL (ref 6.0–8.3)

## 2019-03-05 LAB — PSA: PSA: 3.76 ng/mL (ref 0.10–4.00)

## 2019-03-05 LAB — HEMOGLOBIN A1C: Hgb A1c MFr Bld: 7.4 % — ABNORMAL HIGH (ref 4.6–6.5)

## 2019-03-12 ENCOUNTER — Other Ambulatory Visit: Payer: Self-pay

## 2019-03-12 ENCOUNTER — Encounter: Payer: Self-pay | Admitting: Internal Medicine

## 2019-03-12 ENCOUNTER — Ambulatory Visit (INDEPENDENT_AMBULATORY_CARE_PROVIDER_SITE_OTHER): Payer: PPO | Admitting: Internal Medicine

## 2019-03-12 VITALS — BP 128/80 | HR 75 | Temp 98.3°F | Ht 66.0 in | Wt 155.0 lb

## 2019-03-12 DIAGNOSIS — Z1211 Encounter for screening for malignant neoplasm of colon: Secondary | ICD-10-CM

## 2019-03-12 DIAGNOSIS — E785 Hyperlipidemia, unspecified: Secondary | ICD-10-CM | POA: Diagnosis not present

## 2019-03-12 DIAGNOSIS — Z7189 Other specified counseling: Secondary | ICD-10-CM | POA: Diagnosis not present

## 2019-03-12 DIAGNOSIS — Z0000 Encounter for general adult medical examination without abnormal findings: Secondary | ICD-10-CM

## 2019-03-12 DIAGNOSIS — I10 Essential (primary) hypertension: Secondary | ICD-10-CM | POA: Diagnosis not present

## 2019-03-12 DIAGNOSIS — N183 Chronic kidney disease, stage 3 unspecified: Secondary | ICD-10-CM

## 2019-03-12 DIAGNOSIS — N400 Enlarged prostate without lower urinary tract symptoms: Secondary | ICD-10-CM | POA: Diagnosis not present

## 2019-03-12 DIAGNOSIS — E1121 Type 2 diabetes mellitus with diabetic nephropathy: Secondary | ICD-10-CM

## 2019-03-12 LAB — HM DIABETES FOOT EXAM

## 2019-03-12 NOTE — Assessment & Plan Note (Signed)
See social history 

## 2019-03-12 NOTE — Progress Notes (Signed)
Hearing Screening   Method: Audiometry   125Hz  250Hz  500Hz  1000Hz  2000Hz  3000Hz  4000Hz  6000Hz  8000Hz   Right ear:   20 20 20  20     Left ear:   20 20 20  25     Vision Screening Comments: June 2020

## 2019-03-12 NOTE — Assessment & Plan Note (Signed)
I have personally reviewed the Medicare Annual Wellness questionnaire and have noted 1. The patient's medical and social history 2. Their use of alcohol, tobacco or illicit drugs 3. Their current medications and supplements 4. The patient's functional ability including ADL's, fall risks, home safety risks and hearing or visual             impairment. 5. Diet and physical activities 6. Evidence for depression or mood disorders  The patients weight, height, BMI and visual acuity have been recorded in the chart I have made referrals, counseling and provided education to the patient based review of the above and I have provided the pt with a written personalized care plan for preventive services.  I have provided you with a copy of your personalized plan for preventive services. Please take the time to review along with your updated medication list.  Will get flu vaccine later in fall Recommended shingrix at pharmacy FIT Recent PSA at his request--normal Stays fit

## 2019-03-12 NOTE — Assessment & Plan Note (Signed)
Lab Results  Component Value Date   HGBA1C 7.4 (H) 03/05/2019   Good control still Mild CKD

## 2019-03-12 NOTE — Assessment & Plan Note (Signed)
Stable on ARB 

## 2019-03-12 NOTE — Assessment & Plan Note (Signed)
BP Readings from Last 3 Encounters:  03/12/19 128/80  09/05/18 136/80  04/03/18 (!) 142/82   Good control

## 2019-03-12 NOTE — Progress Notes (Signed)
Subjective:    Patient ID: James Braun, male    DOB: Nov 25, 1940, 78 y.o.   MRN: [id number]  HPI Here for Medicare wellness and follow up of chronic health conditions Reviewed form and advanced directives Reviewed other doctors No alcohol or tobacco Exercises regularly Hearing is fine Vision seems fine---still some changes after right eye cataract surgery Did trip once---no injury No depression or anhedonia Independent with instrumental ADLs No memory issues of concern  Concerned about his A1c up over 7% Did eat a lot of watermelon--wonders if this caused it Eating healthy still Checks very infrequently--wondered about CGM (probably not appropriate for him)  Has known CKD On ARB  No chest pain or SOB No dizziness or syncope No palpitations No edema No headaches  Sees urology for BPH Flow is generally okay on finasteride Seems to empty fine (other than when constipated) Nocturia is rare No kidney stones on K-citrate (and sugar free lemonade)  Current Outpatient Medications on File Prior to Visit  Medication Sig Dispense Refill  . aspirin 81 MG tablet Take 81 mg by mouth daily.     Marland Kitchen diltiazem (CARDIZEM CD) 240 MG 24 hr capsule Take 1 capsule (240 mg total) by mouth daily. 90 capsule 3  . finasteride (PROSCAR) 5 MG tablet Take 1 tablet (5 mg total) by mouth daily. 90 tablet 3  . metFORMIN (GLUCOPHAGE-XR) 500 MG 24 hr tablet Take 1 tablet (500 mg total) by mouth 2 (two) times daily. 180 tablet 3  . Multiple Vitamin (MULTIVITAMIN) tablet Take 1 tablet by mouth daily.    . Potassium Citrate 15 MEQ (1620 MG) TBCR TAKE 1 TABLET BY MOUTH 3 TIMES A DAY 270 tablet 3  . Red Yeast Rice Extract (RED YEAST RICE PO) Take 1 tablet by mouth daily.     . sildenafil (REVATIO) 20 MG tablet Take 3-5 tablets (60-100 mg total) by mouth daily as needed. 50 tablet 11  . telmisartan (MICARDIS) 80 MG tablet Take 1 tablet (80 mg total) by mouth every morning. 90 tablet 3  . timolol (TIMOPTIC)  0.5 % ophthalmic solution INSTILL 1 DROP TWICE DAILY INTO BOTH EYES    . triamterene-hydrochlorothiazide (MAXZIDE-25) 37.5-25 MG tablet Take 1 tablet by mouth daily. 90 tablet 3   No current facility-administered medications on file prior to visit.     Allergies  Allergen Reactions  . Atorvastatin Other (See Comments)    REACTION: myalgias  . Morphine And Related Other (See Comments)     hypotension  . Levofloxacin Rash  . Septra [Sulfamethoxazole-Trimethoprim] Rash  . Sulfamethoxazole Rash    Past Medical History:  Diagnosis Date  . BPH (benign prostatic hypertrophy)   . History of kidney stones   . History of melanoma excision OCT 2010  . Hyperlipidemia   . Hypertension   . Left ureteral calculus   . Nephrolithiasis    bilateral  . Type 2 diabetes mellitus ([place])     Past Surgical History:  Procedure Laterality Date  . CHOLECYSTECTOMY  july 2000  . CYSTOSCOPY WITH STENT PLACEMENT Left 11/14/2012   Procedure: CYSTOSCOPY WITH STENT PLACEMENT;  Surgeon: [name], MD;  Location: [facility];  Service: Urology;  Laterality: Left;  . CYSTOSCOPY/RETROGRADE/URETEROSCOPY/STONE EXTRACTION WITH BASKET Left 11/14/2012   Procedure: CYSTOSCOPY/RETROGRADE/URETEROSCOPY/STONE EXTRACTION WITH BASKET;  Surgeon: [name], MD;  Location[facility];  Service: Urology;  Laterality: Left;  . EXTRACORPOREAL SHOCK WAVE LITHOTRIPSY  JULY 2000   X2  . HOLMIUM LASER APPLICATION  Left 11/14/2012   Procedure: HOLMIUM LASER APPLICATION;  Surgeon: [name], MD;  Location: [facility];  Service: Urology;  Laterality: Left;  . LEFT URETEROSCOPIC STONE EXTRACTION  02-22-2007  . NEPHROLITHOTOMY Right 09-28-2010   percutaneous  . ORIF CLAVICLE FRACTURE  2006  . PROSTATE BIOPSY  1998   neg  . SHOULDER ARTHROSCOPY  1/15   Dr [name]  . TONSILLECTOMY      Family History  Problem Relation Age of Onset  . Hypertension Mother   . Diabetes  Father   . Hypertension Father   . Coronary artery disease Neg Hx   . Cancer Neg Hx     Social History   Socioeconomic History  . Marital status: Married    Spouse name: Not on file  . Number of children: 2  . Years of education: Not on file  . Highest education level: Not on file  Occupational History  . 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 Occupation: Buys very distressed houses and rehabilitates  . Occupation: Does mediation  Social Needs  . Financial resource strain: Not on file  . Food insecurity    Worry: Not on file    Inability: Not on file  . Transportation needs    Medical: Not on file    Non-medical: Not on file  Tobacco Use  . Smoking status: Never Smoker  . Smokeless tobacco: Never Used  Substance and Sexual Activity  . Alcohol use: No  . Drug use: No  . Sexual activity: Not on file  Lifestyle  . Physical activity    Days per week: Not on file    Minutes per session: Not on file  . Stress: Not on file  Relationships  . Social Herbalist on phone: Not on file    Gets together: Not on file    Attends religious service: Not on file    Active member of club or organization: Not on file    Attends meetings of clubs or organizations: Not on file    Relationship status: Not on file  . Intimate partner violence    Fear of current or ex partner: Not on file    Emotionally abused: Not on file    Physically abused: Not on file    Forced sexual activity: Not on file  Other Topics Concern  . Not on file  Social History Narrative   Has living will   Wife is health care POA---alternate is committee of physicians   Would accept resuscitation---wouldn't accept prolonged ventilation   No tube feeds if cognitively unaware    Review of Systems Appetite is fine---fasts (600 calories) twice a week still Weight down slightly  Sleeps well Wears seat belt Teeth okay--keeps up with dentist No heartburn or dysphagia (unless he eats late at  night--gaviscon will help) Bowels generally okay--no blood 2 suspicious lesions on temple/scalp--has derm appt this week    Objective:   Physical Exam  Constitutional: He is oriented to person, place, and time. He appears well-developed. No distress.  HENT:  Mouth/Throat: Oropharynx is clear and moist. No oropharyngeal exudate.  Neck: No thyromegaly present.  Cardiovascular: Normal rate, regular rhythm, normal heart sounds and intact distal pulses. Exam reveals no gallop.  No murmur heard. Respiratory: Effort normal and breath sounds normal. No respiratory distress. He has no wheezes. He has no rales.  GI: Soft. There is no abdominal tenderness.  Musculoskeletal:        General: No tenderness or  edema.  Lymphadenopathy:    He has no cervical adenopathy.  Neurological: He is alert and oriented to person, place, and time.  President--- "Daisy Floro, Obama, Bush" [id number] Recall 3/3  Normal fine touch sensation in feet  Skin: No rash noted.  No foot lesions  Psychiatric: He has a normal mood and affect. His behavior is normal.           Assessment & Plan:

## 2019-03-12 NOTE — Assessment & Plan Note (Signed)
Takes red yeast rice only

## 2019-03-12 NOTE — Assessment & Plan Note (Signed)
Does okay with finasteride

## 2019-03-14 DIAGNOSIS — Z85820 Personal history of malignant melanoma of skin: Secondary | ICD-10-CM | POA: Diagnosis not present

## 2019-03-14 DIAGNOSIS — D225 Melanocytic nevi of trunk: Secondary | ICD-10-CM | POA: Diagnosis not present

## 2019-03-14 DIAGNOSIS — Z08 Encounter for follow-up examination after completed treatment for malignant neoplasm: Secondary | ICD-10-CM | POA: Diagnosis not present

## 2019-03-14 DIAGNOSIS — Z1283 Encounter for screening for malignant neoplasm of skin: Secondary | ICD-10-CM | POA: Diagnosis not present

## 2019-03-14 DIAGNOSIS — B078 Other viral warts: Secondary | ICD-10-CM | POA: Diagnosis not present

## 2019-03-27 ENCOUNTER — Other Ambulatory Visit (INDEPENDENT_AMBULATORY_CARE_PROVIDER_SITE_OTHER): Payer: PPO

## 2019-03-27 DIAGNOSIS — Z1211 Encounter for screening for malignant neoplasm of colon: Secondary | ICD-10-CM | POA: Diagnosis not present

## 2019-03-27 LAB — FECAL OCCULT BLOOD, IMMUNOCHEMICAL: Fecal Occult Bld: NEGATIVE

## 2019-04-02 ENCOUNTER — Ambulatory Visit: Payer: PPO | Admitting: Cardiovascular Disease

## 2019-05-16 ENCOUNTER — Other Ambulatory Visit: Payer: Self-pay | Admitting: Internal Medicine

## 2019-05-29 ENCOUNTER — Ambulatory Visit (INDEPENDENT_AMBULATORY_CARE_PROVIDER_SITE_OTHER): Payer: PPO

## 2019-05-29 DIAGNOSIS — Z23 Encounter for immunization: Secondary | ICD-10-CM

## 2019-06-02 ENCOUNTER — Encounter: Payer: Self-pay | Admitting: Podiatry

## 2019-06-02 ENCOUNTER — Ambulatory Visit: Payer: PPO | Admitting: Podiatry

## 2019-06-02 ENCOUNTER — Other Ambulatory Visit: Payer: Self-pay

## 2019-06-02 DIAGNOSIS — E1140 Type 2 diabetes mellitus with diabetic neuropathy, unspecified: Secondary | ICD-10-CM

## 2019-06-02 DIAGNOSIS — B351 Tinea unguium: Secondary | ICD-10-CM

## 2019-06-02 DIAGNOSIS — Q828 Other specified congenital malformations of skin: Secondary | ICD-10-CM

## 2019-06-02 DIAGNOSIS — M79676 Pain in unspecified toe(s): Secondary | ICD-10-CM | POA: Diagnosis not present

## 2019-06-02 NOTE — Progress Notes (Signed)
Patient ID: James Braun, male   DOB: 09/19/1940, 78 y.o.   MRN: [id number]   Complaint:  Visit Type: Patient returns to my office for continued preventative foot care services. Complaint: Patient states" my nails have grown long and thick and become painful to walk and wear shoes" Patient has been diagnosed with DM with  preulcerative lesions and hammer toes . The patient presents for preventative foot care services. No changes to ROS.    Podiatric Exam: Vascular: dorsalis pedis and posterior tibial pulses are palpable bilateral. Capillary return is immediate. Temperature gradient is WNL. Skin turgor WNL  Sensorium: Diminished  Semmes Weinstein monofilament test. Normal tactile sensation bilaterally. Nail Exam: Pt has thick disfigured discolored nails with subungual debris noted bilateral entire nail hallux through fifth toenails Ulcer Exam: There is no evidence of ulcer or pre-ulcerative changes or infection. Orthopedic Exam: Muscle tone and strength are WNL. No limitations in general ROM. No crepitus or effusions noted. Foot type and digits show no abnormalities.Hammer toes  B/L Skin:  Porokeratosis sub 5 B/L  and heel left  foot. No infection or ulcers.  Porokeratosis 1st MPJ left foot. Heel callus left heel.    Diagnosis:  Onychomycosis, , Pain in right toe, pain in left toes. Porokeratosis  B/L  Treatment & Plan Procedures and Treatment: Consent by patient was obtained for treatment procedures. The patient understood the discussion of treatment and procedures well. All questions were answered thoroughly reviewed. Debridement of mycotic and hypertrophic toenails, 1 through 5 bilateral and clearing of subungual debris. No ulceration, no infection noted. Debride porokeratosis B/L. Return Visit-Office Procedure: Patient instructed to return to the office for a follow up visit 3 months for continued evaluation and treatment.     Gardiner Barefoot DPM

## 2019-06-04 DIAGNOSIS — H0012 Chalazion right lower eyelid: Secondary | ICD-10-CM | POA: Diagnosis not present

## 2019-06-17 ENCOUNTER — Ambulatory Visit (INDEPENDENT_AMBULATORY_CARE_PROVIDER_SITE_OTHER): Payer: PPO | Admitting: Cardiovascular Disease

## 2019-06-17 ENCOUNTER — Encounter: Payer: Self-pay | Admitting: Cardiovascular Disease

## 2019-06-17 ENCOUNTER — Other Ambulatory Visit: Payer: Self-pay

## 2019-06-17 VITALS — BP 132/72 | HR 73 | Temp 97.3°F | Ht 66.0 in | Wt 160.0 lb

## 2019-06-17 DIAGNOSIS — R072 Precordial pain: Secondary | ICD-10-CM

## 2019-06-17 DIAGNOSIS — E782 Mixed hyperlipidemia: Secondary | ICD-10-CM | POA: Diagnosis not present

## 2019-06-17 DIAGNOSIS — I10 Essential (primary) hypertension: Secondary | ICD-10-CM | POA: Diagnosis not present

## 2019-06-17 NOTE — Assessment & Plan Note (Signed)
History of hyperlipidemia intolerant to statin therapy on red yeast rice for lipid profile performed 03/05/2019 revealing total cholesterol of 192, LDL of 121 and HDL of 37.

## 2019-06-17 NOTE — Progress Notes (Signed)
06/17/2019 James Braun   16-Feb-1941  [id number]  Primary Physician [name], MD Primary Cardiologist: [name] MD Lupe Carney, [place]  HPI:  James Braun is a 78 y.o.  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  He has a 96% success rate.  He was referred by Dr. Annamarie Major for cardiovascular evaluation because of new onset chest pain.  I last saw him in the office 12/25/2017. He does have a history of treated hypertension, diabetes and hyperlipidemia.  He is never had a heart attack or stroke.  He has no other cardiac risk factors.  He had a stressful event in his life 2 and half weeks ago chest pain subsequent to that and daily since.  The pain is not necessarily occur with exertion.  It occurred randomly lasted minutes at a time without radiation.  He does exercise 3 days a week to 30 minutes at a time on the treadmill and does intermittent fasting as well.  I performed nuclear stress testing 01/09/2018 which was low risk and nonischemic and obtain a 2D echocardiogram 01/07/2018 which was essentially unremarkable as well.  He continues to exercise, and is active without limitation.  His only complaint is he does not have the energy that he used to.   Current Meds  Medication Sig  . aspirin 81 MG tablet Take 81 mg by mouth daily.   Marland Kitchen diltiazem (CARDIZEM CD) 240 MG 24 hr capsule Take 1 capsule (240 mg total) by mouth daily.  . finasteride (PROSCAR) 5 MG tablet Take 1 tablet (5 mg total) by mouth daily.  . metFORMIN (GLUCOPHAGE-XR) 500 MG 24 hr tablet Take 1 tablet (500 mg total) by mouth 2 (two) times daily.  . Multiple Vitamin (MULTIVITAMIN) tablet Take 1 tablet by mouth daily.  . Potassium Citrate 15 MEQ (1620 MG) TBCR TAKE 1 TABLET BY MOUTH 3 TIMES A DAY  . Red Yeast Rice Extract (RED YEAST RICE PO) Take 1 tablet by mouth daily.   . sildenafil (REVATIO) 20 MG tablet  Take 3-5 tablets (60-100 mg total) by mouth daily as needed.  Marland Kitchen telmisartan (MICARDIS) 80 MG tablet Take 1 tablet (80 mg total) by mouth every morning.  . timolol (TIMOPTIC) 0.5 % ophthalmic solution INSTILL 1 DROP TWICE DAILY INTO BOTH EYES  . triamterene-hydrochlorothiazide (MAXZIDE-25) 37.5-25 MG tablet Take 1 tablet by mouth daily.     Allergies  Allergen Reactions  . Atorvastatin Other (See Comments)    REACTION: myalgias  . Morphine And Related Other (See Comments)     hypotension  . Levofloxacin Rash  . Septra [Sulfamethoxazole-Trimethoprim] Rash  . Sulfamethoxazole Rash    Social History   Socioeconomic History  . Marital status: Married    Spouse name: Not on file  . Number of children: 2  . Years of education: Not on file  . Highest education level: Not on file  Occupational History  . 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 Occupation: Buys very distressed houses and rehabilitates  . Occupation: Does mediation  Social Needs  . Financial resource strain: Not on file  . Food insecurity    Worry: Not on file    Inability: Not on file  . Transportation needs    Medical: Not on file    Non-medical: Not on file  Tobacco Use  . Smoking status: Never Smoker  .  Smokeless tobacco: Never Used  Substance and Sexual Activity  . Alcohol use: No  . Drug use: No  . Sexual activity: Not on file  Lifestyle  . Physical activity    Days per week: Not on file    Minutes per session: Not on file  . Stress: Not on file  Relationships  . Social Herbalist on phone: Not on file    Gets together: Not on file    Attends religious service: Not on file    Active member of club or organization: Not on file    Attends meetings of clubs or organizations: Not on file    Relationship status: Not on file  . Intimate partner violence    Fear of current or ex partner: Not on file    Emotionally abused: Not on file    Physically abused: Not on file    Forced sexual  activity: Not on file  Other Topics Concern  . Not on file  Social History Narrative   Has living will   Wife is health care POA---alternate is committee of physicians   Would accept resuscitation---wouldn't accept prolonged ventilation   No tube feeds if cognitively unaware     Review of Systems: General: negative for chills, fever, night sweats or weight changes.  Cardiovascular: negative for chest pain, dyspnea on exertion, edema, orthopnea, palpitations, paroxysmal nocturnal dyspnea or shortness of breath Dermatological: negative for rash Respiratory: negative for cough or wheezing Urologic: negative for hematuria Abdominal: negative for nausea, vomiting, diarrhea, bright red blood per rectum, melena, or hematemesis Neurologic: negative for visual changes, syncope, or dizziness All other systems reviewed and are otherwise negative except as noted above.    Blood pressure 132/72, pulse 73, temperature (!) 97.3 F (36.3 C), height 5\' 6"  (1.676 m), weight 160 lb (72.6 kg).  General appearance: alert and no distress Neck: no adenopathy, no carotid bruit, no JVD, supple, symmetrical, trachea midline and thyroid not enlarged, symmetric, no tenderness/mass/nodules Lungs: clear to auscultation bilaterally Heart: regular rate and rhythm, S1, S2 normal, no murmur, click, rub or gallop Extremities: extremities normal, atraumatic, no cyanosis or edema Pulses: 2+ and symmetric Skin: Skin color, texture, turgor normal. No rashes or lesions Neurologic: Alert and oriented X 3, normal strength and tone. Normal symmetric reflexes. Normal coordination and gait  EKG sinus rhythm at 73 without ST or T wave changes.  I personally reviewed this EKG  ASSESSMENT AND PLAN:   Hyperlipemia History of hyperlipidemia intolerant to statin therapy on red yeast rice for lipid profile performed 03/05/2019 revealing total cholesterol of 192, LDL of 121 and HDL of 37.  Essential hypertension, benign History  of essential hypertension with blood pressure measured today at 132/72.  He is on diltiazem and Micardis as well as Maxide.      [name] MD FACP,FACC,FAHA, [facility] 06/17/2019 4:31 PM

## 2019-06-17 NOTE — Patient Instructions (Signed)
Medication Instructions:  Your physician recommends that you continue on your current medications as directed. Please refer to the Current Medication list given to you today.  If you need a refill on your cardiac medications before your next appointment, please call your pharmacy.   Lab work: NONE  Testing/Procedures: NONE  Follow-Up: At [facility], you and your health needs are our priority.  As part of our continuing mission to provide you with exceptional heart care, we have created designated Provider Care Teams.  These Care Teams include your primary Cardiologist (physician) and Advanced Practice Providers (APPs -  Physician Assistants and Nurse Practitioners) who all work together to provide you with the care you need, when you need it. You may see Jonathan Berry, MD or one of the following Advanced Practice Providers on your designated Care Team:    Luke Kilroy, PA-C  Callie Goodrich, PA-C  Jesse Cleaver, FNP Your physician wants you to follow-up in 1 year        

## 2019-06-17 NOTE — Assessment & Plan Note (Signed)
History of essential hypertension with blood pressure measured today at 132/72.  He is on diltiazem and Micardis as well as Maxide.

## 2019-06-30 ENCOUNTER — Ambulatory Visit (INDEPENDENT_AMBULATORY_CARE_PROVIDER_SITE_OTHER): Payer: PPO | Admitting: Internal Medicine

## 2019-06-30 ENCOUNTER — Encounter: Payer: Self-pay | Admitting: Internal Medicine

## 2019-06-30 ENCOUNTER — Other Ambulatory Visit: Payer: Self-pay

## 2019-06-30 DIAGNOSIS — K219 Gastro-esophageal reflux disease without esophagitis: Secondary | ICD-10-CM | POA: Diagnosis not present

## 2019-06-30 DIAGNOSIS — K625 Hemorrhage of anus and rectum: Secondary | ICD-10-CM | POA: Diagnosis not present

## 2019-06-30 MED ORDER — OMEPRAZOLE 20 MG PO CPDR: 20.0000 mg | DELAYED_RELEASE_CAPSULE | Freq: Every day | ORAL | 3 refills | Status: DC

## 2019-06-30 NOTE — Assessment & Plan Note (Signed)
No hemorrhoids or clear fissure---but history suggests external cause Did have multiple polyps on colon 5 years ago---may need to consider repeating if recurrent---but the amount of bleeding he describes doesn't go along with a polyp Nothing to suggest IBD If recurrent, will set back up with GI

## 2019-06-30 NOTE — Assessment & Plan Note (Signed)
New problem No dysphagia No clear etiology for these symptoms coming on now Will Rx with omeprazole for 2-6 weeks depending on response. Then can be prn

## 2019-06-30 NOTE — Patient Instructions (Signed)
Let me know if you have recurrences of the rectal bleeding---I would set you up with a GI specialist. Please take the omeprazole prescription daily on an empty stomach. Hopefully you won't need the gaviscon within a few days. If your symptoms go away quickly, you can take it for just 2 weeks every day--and then use it only as needed. If you continue to have even mild symptoms, continue the omeprazole daily for 6 weeks.

## 2019-06-30 NOTE — Progress Notes (Signed)
Subjective:    Patient ID: James Braun, male    DOB: 12/08/1940, 78 y.o.   MRN: [id number]  HPI Here due to rectal bleeding  This visit occurred during the SARS-CoV-2 public health emergency.  Safety protocols were in place, including screening questions prior to the visit, additional usage of staff PPE, and extensive cleaning of exam room while observing appropriate contact time as indicated for disinfecting solutions.   Has had rare red spots on toilet paper Last night--no straining but had bowl full of blood last night after a normal stool Considerable blood on toilet paper and still dripping He packed it with toilet paper and went to bed Went to bathroom this morning---used bidet and cleaned up Had normal stool this morning  No abdominal pain  Current Outpatient Medications on File Prior to Visit  Medication Sig Dispense Refill  . aspirin 81 MG tablet Take 81 mg by mouth daily.     Marland Kitchen diltiazem (CARDIZEM CD) 240 MG 24 hr capsule Take 1 capsule (240 mg total) by mouth daily. 90 capsule 3  . finasteride (PROSCAR) 5 MG tablet Take 1 tablet (5 mg total) by mouth daily. 90 tablet 3  . metFORMIN (GLUCOPHAGE-XR) 500 MG 24 hr tablet Take 1 tablet (500 mg total) by mouth 2 (two) times daily. 180 tablet 3  . Multiple Vitamin (MULTIVITAMIN) tablet Take 1 tablet by mouth daily.    . Potassium Citrate 15 MEQ (1620 MG) TBCR TAKE 1 TABLET BY MOUTH 3 TIMES A DAY 270 tablet 3  . Red Yeast Rice Extract (RED YEAST RICE PO) Take 1 tablet by mouth daily.     . sildenafil (REVATIO) 20 MG tablet Take 3-5 tablets (60-100 mg total) by mouth daily as needed. 50 tablet 11  . telmisartan (MICARDIS) 80 MG tablet Take 1 tablet (80 mg total) by mouth every morning. 90 tablet 3  . timolol (TIMOPTIC) 0.5 % ophthalmic solution INSTILL 1 DROP TWICE DAILY INTO BOTH EYES    . triamterene-hydrochlorothiazide (MAXZIDE-25) 37.5-25 MG tablet Take 1 tablet by mouth daily. 90 tablet 3   No current facility-administered  medications on file prior to visit.    Allergies  Allergen Reactions  . Atorvastatin Other (See Comments)    REACTION: myalgias  . Morphine And Related Other (See Comments)     hypotension  . Levofloxacin Rash  . Septra [Sulfamethoxazole-Trimethoprim] Rash  . Sulfamethoxazole Rash    Past Medical History:  Diagnosis Date  . BPH (benign prostatic hypertrophy)   . History of kidney stones   . History of melanoma excision OCT 2010  . Hyperlipidemia   . Hypertension   . Left ureteral calculus   . Nephrolithiasis    bilateral  . Type 2 diabetes mellitus ([place])     Past Surgical History:  Procedure Laterality Date  . CHOLECYSTECTOMY  july 2000  . CYSTOSCOPY WITH STENT PLACEMENT Left 11/14/2012   Procedure: CYSTOSCOPY WITH STENT PLACEMENT;  Surgeon: [name], MD;  Location: [facility];  Service: Urology;  Laterality: Left;  . CYSTOSCOPY/RETROGRADE/URETEROSCOPY/STONE EXTRACTION WITH BASKET Left 11/14/2012   Procedure: CYSTOSCOPY/RETROGRADE/URETEROSCOPY/STONE EXTRACTION WITH BASKET;  Surgeon: [name], MD;  [facility];  Service: Urology;  Laterality: Left;  . EXTRACORPOREAL SHOCK WAVE LITHOTRIPSY  JULY 2000   X2  . HOLMIUM LASER APPLICATION Left [id number]   Procedure: HOLMIUM LASER APPLICATION;  Surgeon: [name], MD;  Location: [facility];  Service: Urology;  Laterality: Left;  . LEFT URETEROSCOPIC STONE  EXTRACTION  02-22-2007  . NEPHROLITHOTOMY Right 09-28-2010   percutaneous  . ORIF CLAVICLE FRACTURE  2006  . PROSTATE BIOPSY  1998   neg  . SHOULDER ARTHROSCOPY  1/15   Dr [name]  . TONSILLECTOMY      Family History  Problem Relation Age of Onset  . Hypertension Mother   . Diabetes Father   . Hypertension Father   . Coronary artery disease Neg Hx   . Cancer Neg Hx     Social History   Socioeconomic History  . Marital status: Married    Spouse name: Not on file  . Number of children: 2    . Years of education: Not on file  . Highest education level: Not on file  Occupational History  . 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 Occupation: Buys very distressed houses and rehabilitates  . Occupation: Does mediation  Tobacco Use  . Smoking status: Never Smoker  . Smokeless tobacco: Never Used  Substance and Sexual Activity  . Alcohol use: No  . Drug use: No  . Sexual activity: Not on file  Other Topics Concern  . Not on file  Social History Narrative   Has living will   Wife is health care POA---alternate is committee of physicians   Would accept resuscitation---wouldn't accept prolonged ventilation   No tube feeds if cognitively unaware   Social Determinants of Health   Financial Resource Strain:   . Difficulty of Paying Living Expenses: Not on file  Food Insecurity:   . Worried About Charity fundraiser in the Last Year: Not on file  . Ran Out of Food in the Last Year: Not on file  Transportation Needs:   . Lack of Transportation (Medical): Not on file  . Lack of Transportation (Non-Medical): Not on file  Physical Activity:   . Days of Exercise per Week: Not on file  . Minutes of Exercise per Session: Not on file  Stress:   . Feeling of Stress : Not on file  Social Connections:   . Frequency of Communication with Friends and Family: Not on file  . Frequency of Social Gatherings with Friends and Family: Not on file  . Attends Religious Services: Not on file  . Active Member of Clubs or Organizations: Not on file  . Attends Archivist Meetings: Not on file  . Marital Status: Not on file  Intimate Partner Violence:   . Fear of Current or Ex-Partner: Not on file  . Emotionally Abused: Not on file  . Physically Abused: Not on file  . Sexually Abused: Not on file   Review of Systems 1 month of "gastric disorder" ---using gaviscon nightly. Heartburn Had follow up with cardiology since then--cleared by him Appetite is fine Weight is  stable    Objective:   Physical Exam  Constitutional: He appears well-developed. No distress.  GI: Soft. Bowel sounds are normal. He exhibits no distension and no mass. There is no abdominal tenderness. There is no rebound and no guarding.  Genitourinary:    Genitourinary Comments: No hemorrhoids No clear fissure No internal lesions--prostate feels normal Brown secretions which are trace heme positive            Assessment & Plan:

## 2019-07-26 ENCOUNTER — Ambulatory Visit: Payer: Medicare Other | Attending: Internal Medicine

## 2019-07-26 DIAGNOSIS — Z23 Encounter for immunization: Secondary | ICD-10-CM | POA: Insufficient documentation

## 2019-07-26 NOTE — Progress Notes (Signed)
   Covid-19 Vaccination Clinic  Name:  REGGINALD MOSCOSO    MRN: [id number] DOB: 1940/07/31  07/26/2019  Mr. Swierk was observed post Covid-19 immunization for 15 minutes without incidence. He was provided with Vaccine Information Sheet and instruction to access the V-Safe system.   Mr. Nabong was instructed to call 911 with any severe reactions post vaccine: Marland Kitchen Difficulty breathing  . Swelling of your face and throat  . A fast heartbeat  . A bad rash all over your body  . Dizziness and weakness    Immunizations Administered    Name Date Dose VIS Date Route   Pfizer COVID-19 Vaccine 07/26/2019 12:52 PM 0.3 mL 06/27/2019 Intramuscular   Manufacturer: [place]   Lot: [id number]   [place]: [id number]

## 2019-08-04 ENCOUNTER — Encounter: Payer: Self-pay | Admitting: Podiatry

## 2019-08-04 ENCOUNTER — Other Ambulatory Visit: Payer: Self-pay

## 2019-08-04 ENCOUNTER — Ambulatory Visit: Payer: PPO | Admitting: Podiatry

## 2019-08-04 DIAGNOSIS — M79676 Pain in unspecified toe(s): Secondary | ICD-10-CM

## 2019-08-04 DIAGNOSIS — B351 Tinea unguium: Secondary | ICD-10-CM | POA: Diagnosis not present

## 2019-08-04 DIAGNOSIS — Q828 Other specified congenital malformations of skin: Secondary | ICD-10-CM | POA: Diagnosis not present

## 2019-08-04 DIAGNOSIS — E1140 Type 2 diabetes mellitus with diabetic neuropathy, unspecified: Secondary | ICD-10-CM

## 2019-08-04 NOTE — Progress Notes (Signed)
Patient ID: IMTIAZ ISAKSEN, male   DOB: July 10, 1941, 79 y.o.   MRN: [id number]   Complaint:  Visit Type: Patient returns to my office for continued preventative foot care services. Complaint: Patient states" my nails have grown long and thick and become painful to walk and wear shoes" Patient has been diagnosed with DM with  preulcerative lesions and hammer toes . The patient presents for preventative foot care services. No changes to ROS.    Podiatric Exam: Vascular: dorsalis pedis and posterior tibial pulses are palpable bilateral. Capillary return is immediate. Temperature gradient is WNL. Skin turgor WNL  Sensorium: Diminished  Semmes Weinstein monofilament test. Normal tactile sensation bilaterally. Nail Exam: Pt has thick disfigured discolored nails with subungual debris noted bilateral entire nail hallux through fifth toenails.  Plantar flex 5thmet  B/L. Ulcer Exam: There is no evidence of ulcer or pre-ulcerative changes or infection. Orthopedic Exam: Muscle tone and strength are WNL. No limitations in general ROM. No crepitus or effusions noted. Foot type and digits show no abnormalities.Hammer toes  B/L Skin:  Porokeratosis sub 5 B/L  and heel  Left foot.   Diagnosis:  Onychomycosis, , Pain in right toe, pain in left toes. Porokeratosis  B/L  Treatment & Plan Procedures and Treatment: Consent by patient was obtained for treatment procedures. The patient understood the discussion of treatment and procedures well. All questions were answered thoroughly reviewed. Debridement of mycotic and hypertrophic toenails, 1 through 5 bilateral and clearing of subungual debris. No ulceration, no infection noted. Debride porokeratosis B/L. Return Visit-Office Procedure: Patient instructed to return to the office for a follow up visit 3 months for continued evaluation and treatment.   Gardiner Barefoot DPM

## 2019-08-16 ENCOUNTER — Ambulatory Visit: Payer: PPO | Attending: Internal Medicine

## 2019-08-16 ENCOUNTER — Ambulatory Visit: Payer: PPO

## 2019-08-16 DIAGNOSIS — Z23 Encounter for immunization: Secondary | ICD-10-CM | POA: Insufficient documentation

## 2019-08-16 NOTE — Progress Notes (Signed)
   Covid-19 Vaccination Clinic  Name:  KAIMEN BRUSS    MRN: [id number] DOB: 1941-04-13  08/16/2019  Mr. Brand was observed post Covid-19 immunization for 15 minutes without incidence. He was provided with Vaccine Information Sheet and instruction to access the V-Safe system.   Mr. Duel was instructed to call 911 with any severe reactions post vaccine: Marland Kitchen Difficulty breathing  . Swelling of your face and throat  . A fast heartbeat  . A bad rash all over your body  . Dizziness and weakness    Immunizations Administered    Name Date Dose VIS Date Route   Pfizer COVID-19 Vaccine 08/16/2019 10:46 AM 0.3 mL 06/27/2019 Intramuscular   Manufacturer: [place]   Lot: [id number]   [place]: [id number]

## 2019-09-08 ENCOUNTER — Telehealth: Payer: Self-pay | Admitting: Internal Medicine

## 2019-09-08 MED ORDER — FINASTERIDE 5 MG PO TABS: 5.0000 mg | ORAL_TABLET | Freq: Every day | ORAL | 3 refills | Status: DC

## 2019-09-08 MED ORDER — DILTIAZEM HCL ER BEADS 240 MG PO CP24: 240.0000 mg | ORAL_CAPSULE | Freq: Every day | ORAL | 3 refills | Status: DC

## 2019-09-08 MED ORDER — METFORMIN HCL ER 500 MG PO TB24: 500.0000 mg | ORAL_TABLET | Freq: Two times a day (BID) | ORAL | 3 refills | Status: DC

## 2019-09-08 MED ORDER — TELMISARTAN 80 MG PO TABS: 80.0000 mg | ORAL_TABLET | Freq: Every morning | ORAL | 3 refills | Status: DC

## 2019-09-08 MED ORDER — TRIAMTERENE-HCTZ 37.5-25 MG PO TABS: 1.0000 | ORAL_TABLET | Freq: Every day | ORAL | 3 refills | Status: DC

## 2019-09-08 NOTE — Telephone Encounter (Signed)
Rxs sent electronically to [place].Marland Kitchen

## 2019-09-08 NOTE — Telephone Encounter (Signed)
Patient stated that he reached out to [facility] [facility] order about his prescription. He stated they reached out to our office about his medication refills but they have not heard back from [place]. I did not see a refill request  Patient stated he is needing the following sent in   Diltiazem 240 mg Metformin 500 mg Finasteride 5 mg Telmisartan 80 mg Triamterene-Hydrochlorothiazide 37.5-25 mg    [facility]

## 2019-09-12 ENCOUNTER — Other Ambulatory Visit: Payer: Self-pay | Admitting: Internal Medicine

## 2019-09-12 DIAGNOSIS — E1121 Type 2 diabetes mellitus with diabetic nephropathy: Secondary | ICD-10-CM

## 2019-09-18 ENCOUNTER — Telehealth: Payer: Self-pay

## 2019-09-18 NOTE — Telephone Encounter (Signed)
LVM w COVID screen, front door and back lab 3.4.2021 TLJ

## 2019-09-22 ENCOUNTER — Other Ambulatory Visit (INDEPENDENT_AMBULATORY_CARE_PROVIDER_SITE_OTHER): Payer: PPO

## 2019-09-22 ENCOUNTER — Other Ambulatory Visit: Payer: Self-pay

## 2019-09-22 DIAGNOSIS — Z129 Encounter for screening for malignant neoplasm, site unspecified: Secondary | ICD-10-CM

## 2019-09-22 DIAGNOSIS — E1121 Type 2 diabetes mellitus with diabetic nephropathy: Secondary | ICD-10-CM | POA: Diagnosis not present

## 2019-09-22 LAB — CBC
HCT: 33.5 % — ABNORMAL LOW (ref 39.0–52.0)
Hemoglobin: 10.7 g/dL — ABNORMAL LOW (ref 13.0–17.0)
MCHC: 31.9 g/dL (ref 30.0–36.0)
MCV: 73 fl — ABNORMAL LOW (ref 78.0–100.0)
Platelets: 375 10*3/uL (ref 150.0–400.0)
RBC: 4.6 Mil/uL (ref 4.22–5.81)
RDW: 20.4 % — ABNORMAL HIGH (ref 11.5–15.5)
WBC: 4.6 10*3/uL (ref 4.0–10.5)

## 2019-09-22 LAB — RENAL FUNCTION PANEL
Albumin: 3.9 g/dL (ref 3.5–5.2)
BUN: 18 mg/dL (ref 6–23)
CO2: 31 mEq/L (ref 19–32)
Calcium: 9 mg/dL (ref 8.4–10.5)
Chloride: 100 mEq/L (ref 96–112)
Creatinine, Ser: 1.2 mg/dL (ref 0.40–1.50)
GFR: 58.32 mL/min — ABNORMAL LOW (ref >60.00)
Glucose, Bld: 165 mg/dL — ABNORMAL HIGH (ref 70–99)
Phosphorus: 3.4 mg/dL (ref 2.3–4.6)
Potassium: 3.6 mEq/L (ref 3.5–5.1)
Sodium: 139 mEq/L (ref 135–145)

## 2019-09-22 LAB — LIPID PANEL
Cholesterol: 172 mg/dL (ref 0–200)
HDL: 32.4 mg/dL — ABNORMAL LOW (ref >39.00)
LDL Cholesterol: 107 mg/dL — ABNORMAL HIGH (ref 0–99)
NonHDL: 139.53
Total CHOL/HDL Ratio: 5
Triglycerides: 164 mg/dL — ABNORMAL HIGH (ref 0.0–149.0)
VLDL: 32.8 mg/dL (ref 0.0–40.0)

## 2019-09-22 LAB — PSA: PSA: 4.11 ng/mL — ABNORMAL HIGH (ref 0.10–4.00)

## 2019-09-22 LAB — HEMOGLOBIN A1C: Hgb A1c MFr Bld: 7.5 % — ABNORMAL HIGH (ref 4.6–6.5)

## 2019-09-24 ENCOUNTER — Ambulatory Visit (INDEPENDENT_AMBULATORY_CARE_PROVIDER_SITE_OTHER): Payer: PPO | Admitting: Internal Medicine

## 2019-09-24 ENCOUNTER — Other Ambulatory Visit: Payer: Self-pay

## 2019-09-24 ENCOUNTER — Encounter: Payer: Self-pay | Admitting: Internal Medicine

## 2019-09-24 DIAGNOSIS — I10 Essential (primary) hypertension: Secondary | ICD-10-CM | POA: Diagnosis not present

## 2019-09-24 DIAGNOSIS — K2100 Gastro-esophageal reflux disease with esophagitis, without bleeding: Secondary | ICD-10-CM | POA: Diagnosis not present

## 2019-09-24 DIAGNOSIS — N1831 Chronic kidney disease, stage 3a: Secondary | ICD-10-CM | POA: Diagnosis not present

## 2019-09-24 DIAGNOSIS — E1121 Type 2 diabetes mellitus with diabetic nephropathy: Secondary | ICD-10-CM | POA: Diagnosis not present

## 2019-09-24 NOTE — Assessment & Plan Note (Signed)
BP Readings from Last 3 Encounters:  09/24/19 122/76  06/30/19 116/76  06/17/19 132/72   Good control

## 2019-09-24 NOTE — Assessment & Plan Note (Signed)
This seems to be the reason for his ongoing chest pain Had negative stress test 6/19--this is reassuring but the pain is concerning He stopped the PPI and only restarted it 10 days ago Will try bid for 2 weeks--then stay on daily till our next visit If symptoms aren't better in 1 week or so--will send to GI (he asks for [place])

## 2019-09-24 NOTE — Assessment & Plan Note (Signed)
Lab Results  Component Value Date   HGBA1C 7.5 (H) 09/22/2019   Control is fine Asked him to check occasionally Is on ARB

## 2019-09-24 NOTE — Patient Instructions (Addendum)
Please continue the omeprazole--but increase to twice a day for the next 2 weeks. If your symptoms are gone within a week, you can then decrease the omeprazole to daily---but do not stop it.  If your symptoms aren't gone within the next 1 week, I need to set you up with a GI specialist.

## 2019-09-24 NOTE — Assessment & Plan Note (Signed)
Stable function on ARB

## 2019-09-24 NOTE — Progress Notes (Signed)
Subjective:    Patient ID: James Braun, male    DOB: 1941/01/18, 79 y.o.   MRN: [id number]  HPI Here for follow up of diabetes and other chronic health conditions This visit occurred during the SARS-CoV-2 public health emergency.  Safety protocols were in place, including screening questions prior to the visit, additional usage of staff PPE, and extensive cleaning of exam room while observing appropriate contact time as indicated for disinfecting solutions.   He is still having troubles with GERD Goes back for a year--but worsening Went to cardiologist--not heart Then here--started omeprazole and improved It then recurred but worse Working on healthy diet, cut out lemonade/juice and cut down K citrate (in case that was causing some troubles) Still some chest pain at night--substernal (and gets gas that is irritating) Got so bad he needed hydrocodone at night He did stop the omeprazole after symptoms resolved--and then it came back Now back on the omeprazole for 10 days (is taking on empty stomach) No dysphagia   Not checking sugars No hypoglycemic spells Known CKD 3---is on ARB  No other chest pain Not really exercising currently----will get the heartburn if he is on the treadmill for a while No SOB  Current Outpatient Medications on File Prior to Visit  Medication Sig Dispense Refill  . diltiazem (CARDIZEM CD) 240 MG 24 hr capsule Take 1 capsule (240 mg total) by mouth daily. 90 capsule 3  . diltiazem (TIAZAC) 240 MG 24 hr capsule Take 1 capsule (240 mg total) by mouth daily. 90 capsule 3  . finasteride (PROSCAR) 5 MG tablet Take 1 tablet (5 mg total) by mouth daily. 90 tablet 3  . metFORMIN (GLUCOPHAGE-XR) 500 MG 24 hr tablet Take 1 tablet (500 mg total) by mouth 2 (two) times daily. 180 tablet 3  . Multiple Vitamin (MULTIVITAMIN) tablet Take 1 tablet by mouth daily.    Marland Kitchen omeprazole (PRILOSEC) 20 MG capsule Take 1 capsule (20 mg total) by mouth daily. 30 capsule 3  .  Potassium Citrate 15 MEQ (1620 MG) TBCR TAKE 1 TABLET BY MOUTH 3 TIMES A DAY 270 tablet 3  . Red Yeast Rice Extract (RED YEAST RICE PO) Take 1 tablet by mouth daily.     . sildenafil (REVATIO) 20 MG tablet Take 3-5 tablets (60-100 mg total) by mouth daily as needed. 50 tablet 11  . telmisartan (MICARDIS) 80 MG tablet Take 1 tablet (80 mg total) by mouth every morning. 90 tablet 3  . timolol (TIMOPTIC) 0.5 % ophthalmic solution INSTILL 1 DROP TWICE DAILY INTO BOTH EYES    . triamterene-hydrochlorothiazide (MAXZIDE-25) 37.5-25 MG tablet Take 1 tablet by mouth daily. 90 tablet 3   No current facility-administered medications on file prior to visit.    Allergies  Allergen Reactions  . Atorvastatin Other (See Comments)    REACTION: myalgias  . Morphine And Related Other (See Comments)     hypotension  . Levofloxacin Rash  . Septra [Sulfamethoxazole-Trimethoprim] Rash  . Sulfamethoxazole Rash    Past Medical History:  Diagnosis Date  . BPH (benign prostatic hypertrophy)   . History of kidney stones   . History of melanoma excision OCT 2010  . Hyperlipidemia   . Hypertension   . Left ureteral calculus   . Nephrolithiasis    bilateral  . Type 2 diabetes mellitus ([place])     Past Surgical History:  Procedure Laterality Date  . CHOLECYSTECTOMY  july 2000  . CYSTOSCOPY WITH STENT PLACEMENT Left 11/14/2012   Procedure:  CYSTOSCOPY WITH STENT PLACEMENT;  Surgeon: [name], MD;  Location: [facility];  Service: Urology;  Laterality: Left;  . CYSTOSCOPY/RETROGRADE/URETEROSCOPY/STONE EXTRACTION WITH BASKET Left 11/14/2012   Procedure: CYSTOSCOPY/RETROGRADE/URETEROSCOPY/STONE EXTRACTION WITH BASKET;  Surgeon: [name], MD;  [facility];  Service: Urology;  Laterality: Left;  . EXTRACORPOREAL SHOCK WAVE LITHOTRIPSY  JULY 2000   X2  . HOLMIUM LASER APPLICATION Left [id number]   Procedure: HOLMIUM LASER APPLICATION;  Surgeon: [name],  MD;  Location: [facility];  Service: Urology;  Laterality: Left;  . LEFT URETEROSCOPIC STONE EXTRACTION  02-22-2007  . NEPHROLITHOTOMY Right 09-28-2010   percutaneous  . ORIF CLAVICLE FRACTURE  2006  . PROSTATE BIOPSY  1998   neg  . SHOULDER ARTHROSCOPY  1/15   Dr [name]  . TONSILLECTOMY      Family History  Problem Relation Age of Onset  . Hypertension Mother   . Diabetes Father   . Hypertension Father   . Coronary artery disease Neg Hx   . Cancer Neg Hx     Social History   Socioeconomic History  . Marital status: Married    Spouse name: Not on file  . Number of children: 2  . Years of education: Not on file  . Highest education level: Not on file  Occupational History  . 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 Occupation: Buys very distressed houses and rehabilitates  . Occupation: Does mediation  Tobacco Use  . Smoking status: Never Smoker  . Smokeless tobacco: Never Used  Substance and Sexual Activity  . Alcohol use: No  . Drug use: No  . Sexual activity: Not on file  Other Topics Concern  . Not on file  Social History Narrative   Has living will   Wife is health care POA---alternate is committee of physicians   Would accept resuscitation---wouldn't accept prolonged ventilation   No tube feeds if cognitively unaware   Social Determinants of Health   Financial Resource Strain:   . Difficulty of Paying Living Expenses: Not on file  Food Insecurity:   . Worried About Charity fundraiser in the Last Year: Not on file  . Ran Out of Food in the Last Year: Not on file  Transportation Needs:   . Lack of Transportation (Medical): Not on file  . Lack of Transportation (Non-Medical): Not on file  Physical Activity:   . Days of Exercise per Week: Not on file  . Minutes of Exercise per Session: Not on file  Stress:   . Feeling of Stress : Not on file  Social Connections:   . Frequency of Communication with Friends and Family: Not on file    . Frequency of Social Gatherings with Friends and Family: Not on file  . Attends Religious Services: Not on file  . Active Member of Clubs or Organizations: Not on file  . Attends Archivist Meetings: Not on file  . Marital Status: Not on file  Intimate Partner Violence:   . Fear of Current or Ex-Partner: Not on file  . Emotionally Abused: Not on file  . Physically Abused: Not on file  . Sexually Abused: Not on file   Review of Systems  Has had chronic cough which is now better on the omeprazole Appetite is okay      Objective:   Physical Exam  Constitutional: He appears well-developed. No distress.  Neck: No thyromegaly present.  Cardiovascular: Normal rate, regular rhythm, normal heart sounds and intact  distal pulses. Exam reveals no gallop.  No murmur heard. Respiratory: Effort normal and breath sounds normal. No respiratory distress. He has no wheezes. He has no rales.  GI: Soft. There is no abdominal tenderness.  Musculoskeletal:        General: No edema.  Lymphadenopathy:    He has no cervical adenopathy.  Psychiatric: He has a normal mood and affect. His behavior is normal.           Assessment & Plan:

## 2019-10-01 ENCOUNTER — Telehealth: Payer: Self-pay | Admitting: Internal Medicine

## 2019-10-01 DIAGNOSIS — K2100 Gastro-esophageal reflux disease with esophagitis, without bleeding: Secondary | ICD-10-CM

## 2019-10-01 NOTE — Telephone Encounter (Signed)
Spoke to pt. He said he tried the omeprazole for the last week and his GERD is not getting any better. He is ready to see GI. He said he wants whoever Dr [name] thinks is best either [facility] or [place].

## 2019-10-01 NOTE — Telephone Encounter (Signed)
Pt called to update dr [name]  Pt symptoms has not gone away. Pt would like know what the next step he need to do

## 2019-10-02 NOTE — Telephone Encounter (Signed)
Please let him know I referred him to [facility] in [place] and he should hear from them in the next few days

## 2019-10-02 NOTE — Telephone Encounter (Signed)
Spoke to pt. He said he was feeling a little better today but still wants to establish with GI.

## 2019-10-06 ENCOUNTER — Other Ambulatory Visit: Payer: Self-pay

## 2019-10-06 ENCOUNTER — Encounter: Payer: Self-pay | Admitting: Podiatry

## 2019-10-06 ENCOUNTER — Ambulatory Visit (INDEPENDENT_AMBULATORY_CARE_PROVIDER_SITE_OTHER): Payer: PPO | Admitting: Podiatry

## 2019-10-06 ENCOUNTER — Encounter: Payer: Self-pay | Admitting: Gastroenterology

## 2019-10-06 DIAGNOSIS — N1831 Chronic kidney disease, stage 3a: Secondary | ICD-10-CM

## 2019-10-06 DIAGNOSIS — M79676 Pain in unspecified toe(s): Secondary | ICD-10-CM

## 2019-10-06 DIAGNOSIS — Q828 Other specified congenital malformations of skin: Secondary | ICD-10-CM | POA: Diagnosis not present

## 2019-10-06 DIAGNOSIS — B351 Tinea unguium: Secondary | ICD-10-CM

## 2019-10-06 DIAGNOSIS — E1121 Type 2 diabetes mellitus with diabetic nephropathy: Secondary | ICD-10-CM | POA: Diagnosis not present

## 2019-10-06 NOTE — Progress Notes (Signed)
This patient returns to my office for at risk foot care.  This patient requires this care by a professional since this patient will be at risk due to having  Diabetes and CKD.    This patient is unable to cut nails himself since the patient cannot reach his nails.These nails are painful walking and wearing shoes.  This patient presents for at risk foot care today.  General Appearance  Alert, conversant and in no acute stress.  Vascular  Dorsalis pedis and posterior tibial  pulses are palpable  bilaterally.  Capillary return is within normal limits  bilaterally. Temperature is within normal limits  bilaterally.  Neurologic  Senn-Weinstein monofilament wire test diminished  bilaterally. Muscle power within normal limits bilaterally.  Nails Thick disfigured discolored nails with subungual debris  from hallux to fifth toes bilaterally. No evidence of bacterial infection or drainage bilaterally.  Orthopedic  No limitations of motion  feet .  No crepitus or effusions noted.  No bony pathology or digital deformities noted.  Skin  normotropic skin with no porokeratosis noted bilaterally.  No signs of infections or ulcers noted.   Porokeratosis sub 5th  B/L and left hee.  Onychomycosis  Pain in right toes  Pain in left toes  Porokeratosis  B/L.  Consent was obtained for treatment procedures.   Mechanical debridement of nails 1-5  bilaterally performed with a nail nipper.  Filed with dremel without incident. No infection or ulcer.  Debride callus with # 15 blade.   Return office visit    3 months                  Told patient to return for periodic foot care and evaluation due to potential at risk complications.   Gardiner Barefoot DPM

## 2019-10-23 ENCOUNTER — Ambulatory Visit: Payer: PPO | Admitting: Orthotics

## 2019-10-23 ENCOUNTER — Other Ambulatory Visit: Payer: Self-pay

## 2019-10-23 DIAGNOSIS — B351 Tinea unguium: Secondary | ICD-10-CM

## 2019-10-23 DIAGNOSIS — M79676 Pain in unspecified toe(s): Secondary | ICD-10-CM

## 2019-10-23 NOTE — Progress Notes (Signed)
Order confirm [employer]  [id number]

## 2019-11-04 ENCOUNTER — Other Ambulatory Visit: Payer: Self-pay

## 2019-11-04 ENCOUNTER — Encounter: Payer: Self-pay | Admitting: Gastroenterology

## 2019-11-04 ENCOUNTER — Ambulatory Visit: Payer: PPO | Admitting: Gastroenterology

## 2019-11-04 VITALS — BP 142/66 | HR 84 | Temp 98.0°F | Ht 67.0 in | Wt 156.0 lb

## 2019-11-04 DIAGNOSIS — R12 Heartburn: Secondary | ICD-10-CM

## 2019-11-04 MED ORDER — OMEPRAZOLE 20 MG PO CPDR: 20.0000 mg | DELAYED_RELEASE_CAPSULE | Freq: Two times a day (BID) | ORAL | 1 refills | Status: DC

## 2019-11-04 NOTE — Progress Notes (Signed)
[facility] Consult Note:  History: James Braun 11/04/2019  Referring provider: [name], MD  Reason for consult/chief complaint: Gastroesophageal Reflux (pt states he has chronic heartburn. Pt states he has some soreness that radiates to right arm. )   Subjective  HPI:  This is a very pleasant 79 year old man referred by primary care at patient's request for chronic heartburn/chest pain.  For at least the last year he has had a burning chest discomfort that he most often notices after walking or exercising at the gym.  It is relieved and sometimes prevented by taking Gaviscon.  The pain will occasionally radiate to the right arm.  He was having this pain when he saw his cardiologist several months ago.  Cardiology clinic note by Dr. [name] on 06/17/2019 reviewed -intermittent chest pain, not necessarily exertional.  Low risk stress test June 2019, normal 2D echocardiogram at that time.  Chest pain not felt to be cardiac in nature.  Colonoscopy report by Dr. Wilford Corner September 2015 with 4 polyps, centimeter or less, one not retrieved.  No pathology report available.  He denies dysphagia or odynophagia.  Denies nausea vomiting early satiety or weight loss.James Braun has been taking omeprazole 20 mg daily, then more recently increased to twice daily before meals.  It helps reduce the quantity and severity of the episodes, but has not eliminated them. He also has the same burning discomfort laying down for sleep or during the night, and that occurs "about half the time".  He sometimes has to get up and sit in a chair for a while, and symptoms seem less frequent if he does not eat within several hours of bed.  He has been engaging in multiple diet and lifestyle changes and eating a fairly bland diet to hopefully reduce symptoms. Lastly, James Braun seems to recall being told years ago that he could have a hiatal hernia when he was having some intermittent dysphagia.  He  does not recall having had an upper endoscopy with Dr. Michail Sermon.  ROS:  Review of Systems  Constitutional: Negative for appetite change and unexpected weight change.  HENT: Negative for mouth sores and voice change.   Eyes: Negative for pain and redness.  Respiratory: Negative for cough and shortness of breath.   Cardiovascular: Negative for chest pain and palpitations.  Genitourinary: Negative for dysuria and hematuria.  Musculoskeletal: Negative for arthralgias and myalgias.  Skin: Negative for pallor and rash.  Neurological: Negative for weakness and headaches.  Hematological: Negative for adenopathy.     Past Medical History: Past Medical History:  Diagnosis Date  . BPH (benign prostatic hypertrophy)   . History of kidney stones   . History of melanoma excision OCT 2010  . Hyperlipidemia   . Hypertension   . Left ureteral calculus   . Nephrolithiasis    bilateral  . Type 2 diabetes mellitus ([place])      Past Surgical History: Past Surgical History:  Procedure Laterality Date  . CHOLECYSTECTOMY  july 2000  . CYSTOSCOPY WITH STENT PLACEMENT Left 11/14/2012   Procedure: CYSTOSCOPY WITH STENT PLACEMENT;  Surgeon: [name], MD;  Location: [facility];  Service: Urology;  Laterality: Left;  . CYSTOSCOPY/RETROGRADE/URETEROSCOPY/STONE EXTRACTION WITH BASKET Left 11/14/2012   Procedure: CYSTOSCOPY/RETROGRADE/URETEROSCOPY/STONE EXTRACTION WITH BASKET;  Surgeon: [name], MD;  Location: [facility];  Service: Urology;  Laterality: Left;  . EXTRACORPOREAL SHOCK WAVE LITHOTRIPSY  JULY 2000   X2  . HOLMIUM LASER APPLICATION Left [id number]  Procedure: HOLMIUM LASER APPLICATION;  Surgeon: [name], MD;  Location: [facility];  Service: Urology;  Laterality: Left;  . LEFT URETEROSCOPIC STONE EXTRACTION  02-22-2007  . NEPHROLITHOTOMY Right 09-28-2010   percutaneous  . ORIF CLAVICLE FRACTURE  2006  . PROSTATE BIOPSY   1998   neg  . SHOULDER ARTHROSCOPY  1/15   Dr [name]  . TONSILLECTOMY       Family History: Family History  Problem Relation Age of Onset  . Hypertension Mother   . Diabetes Father   . Hypertension Father   . Coronary artery disease Neg Hx   . Cancer Neg Hx     Social History: Social History   Socioeconomic History  . Marital status: Married    Spouse name: Not on file  . Number of children: 2  . Years of education: Not on file  . Highest education level: Not on file  Occupational History  . 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 Occupation: Buys very distressed houses and rehabilitates  . Occupation: Does mediation  Tobacco Use  . Smoking status: Never Smoker  . Smokeless tobacco: Never Used  Substance and Sexual Activity  . Alcohol use: No  . Drug use: No  . Sexual activity: Not on file  Other Topics Concern  . Not on file  Social History Narrative   Has living will   Wife is health care POA---alternate is committee of physicians   Would accept resuscitation---wouldn't accept prolonged ventilation   No tube feeds if cognitively unaware   Social Determinants of Health   Financial Resource Strain:   . Difficulty of Paying Living Expenses:   Food Insecurity:   . Worried About Charity fundraiser in the Last Year:   . Arboriculturist in the Last Year:   Transportation Needs:   . Film/video editor (Medical):   Marland Kitchen Lack of Transportation (Non-Medical):   Physical Activity:   . Days of Exercise per Week:   . Minutes of Exercise per Session:   Stress:   . Feeling of Stress :   Social Connections:   . Frequency of Communication with Friends and Family:   . Frequency of Social Gatherings with Friends and Family:   . Attends Religious Services:   . Active Member of Clubs or Organizations:   . Attends Archivist Meetings:   Marland Kitchen Marital Status:     Allergies: Allergies  Allergen Reactions  . Atorvastatin Other (See Comments)     REACTION: myalgias  . Morphine And Related Other (See Comments)     hypotension  . Levofloxacin Rash  . Septra [Sulfamethoxazole-Trimethoprim] Rash  . Sulfamethoxazole Rash    Outpatient Meds: Current Outpatient Medications  Medication Sig Dispense Refill  . diltiazem (CARDIZEM CD) 240 MG 24 hr capsule Take 1 capsule (240 mg total) by mouth daily. 90 capsule 3  . diltiazem (TIAZAC) 240 MG 24 hr capsule Take 1 capsule (240 mg total) by mouth daily. 90 capsule 3  . finasteride (PROSCAR) 5 MG tablet Take 1 tablet (5 mg total) by mouth daily. 90 tablet 3  . metFORMIN (GLUCOPHAGE-XR) 500 MG 24 hr tablet Take 1 tablet (500 mg total) by mouth 2 (two) times daily. 180 tablet 3  . Multiple Vitamin (MULTIVITAMIN) tablet Take 1 tablet by mouth daily.    Marland Kitchen omeprazole (PRILOSEC) 20 MG capsule Take 1 capsule (20 mg total) by mouth 2 (two) times daily before a meal. 60 capsule 1  . Potassium Citrate  15 MEQ (1620 MG) TBCR TAKE 1 TABLET BY MOUTH 3 TIMES A DAY 270 tablet 3  . Red Yeast Rice Extract (RED YEAST RICE PO) Take 1 tablet by mouth daily.     . sildenafil (REVATIO) 20 MG tablet Take 3-5 tablets (60-100 mg total) by mouth daily as needed. 50 tablet 11  . telmisartan (MICARDIS) 80 MG tablet Take 1 tablet (80 mg total) by mouth every morning. 90 tablet 3  . timolol (TIMOPTIC) 0.5 % ophthalmic solution INSTILL 1 DROP TWICE DAILY INTO BOTH EYES    . triamterene-hydrochlorothiazide (MAXZIDE-25) 37.5-25 MG tablet Take 1 tablet by mouth daily. 90 tablet 3   No current facility-administered medications for this visit.      ___________________________________________________________________ Objective   Exam:  BP (!) 142/66   Pulse 84   Temp 98 F (36.7 C)   Ht 5\' 7"  (1.702 m)   Wt 156 lb (70.8 kg)   BMI 24.43 kg/m    General: Well-appearing, normal vocal quality  Eyes: sclera anicteric, no redness  ENT: oral mucosa moist without lesions, no cervical or supraclavicular lymphadenopathy.   Good dentition  CV: RRR without murmur, S1/S2, no JVD, no peripheral edema  Resp: clear to auscultation bilaterally, normal RR and effort noted  GI: soft, no tenderness, with active bowel sounds. No guarding or palpable organomegaly noted.  Skin; warm and dry, no rash or jaundice noted  Neuro: awake, alert and oriented x 3. Normal gross motor function and fluent speech  Labs: CBC Latest Ref Rng & Units 09/22/2019 03/05/2019 03/01/2018  WBC 4.0 - 10.5 K/uL 4.6 4.5 6.3  Hemoglobin 13.0 - 17.0 g/dL 10.7 Repeated and verified X2.(L) 11.7(L) 12.9(L)  Hematocrit 39.0 - 52.0 % 33.5(L) 36.5(L) 37.5(L)  Platelets 150.0 - 400.0 K/uL 375.0 346.0 402.0(H)  MCV 73  No iron levels on file  CMP Latest Ref Rng & Units 09/22/2019 03/05/2019 03/01/2018  Glucose 70 - 99 mg/dL 165(H) 135(H) 143(H)  BUN 6 - 23 mg/dL 18 27(H) 30(H)  Creatinine 0.40 - 1.50 mg/dL 1.20 1.35 1.31  Sodium 135 - 145 mEq/L 139 141 142  Potassium 3.5 - 5.1 mEq/L 3.6 3.3(L) 3.5  Chloride 96 - 112 mEq/L 100 103 102  CO2 19 - 32 mEq/L 31 29 32  Calcium 8.4 - 10.5 mg/dL 9.0 8.8 9.7  Total Protein 6.0 - 8.3 g/dL - 6.3 6.7  Total Bilirubin 0.2 - 1.2 mg/dL - 0.3 0.5  Alkaline Phos 39 - 117 U/L - 50 48  AST 0 - 37 U/L - 16 41(H)  ALT 0 - 53 U/L - 16 61(H)     Most recent EKG without ischemic changes (during December cardiology visit)  Assessment: Encounter Diagnosis  Name Primary?  . Heartburn Yes   Many elements of his symptoms sound like reflux that persist despite diet lifestyle changes and twice daily acid suppression.  However, the exertional nature is less typical for reflux.  Perhaps with increased abdominal pressure it is precipitating reflux episodes.  Unknown if he has hiatal hernia, but currently no dysphagia. The nature of this microcytic anemia and its potential relation to the symptoms is uncertain.  It is not clear if that has yet been addressed.  I feel we need to understand these symptoms better, and recommended  upper endoscopy. Plan:  He was agreeable after discussion of procedure and risks, and wished to proceed this week.   The benefits and risks of the planned procedure were described in detail with the patient or (  when appropriate) their health care proxy.  Risks were outlined as including, but not limited to, bleeding, infection, perforation, adverse medication reaction leading to cardiac or pulmonary decompensation, pancreatitis (if ERCP).  The limitation of incomplete mucosal visualization was also discussed.  No guarantees or warranties were given.  Based on results, we can then better determine how much of this is reflux and also whether repeat cardiology work-up may be warranted ( given the exertional component)  Patient requested refill of omeprazole 20 mg twice daily, refilled with 60 tablets, 1 refill.  We will forward my note to cardiology and also the primary care, with particular attention to the microcytic anemia.   Thank you for the courtesy of this consult.  Please call me with any questions or concerns.  Nelida Meuse III  CC: Referring provider noted above

## 2019-11-04 NOTE — Progress Notes (Signed)
Needs return office visit with me in the next month or 2

## 2019-11-04 NOTE — Patient Instructions (Signed)
If you are age 80 or older, your body mass index should be between 23-30. Your Body mass index is 24.43 kg/m. If this is out of the aforementioned range listed, please consider follow up with your Primary Care Provider.  If you are age 9 or younger, your body mass index should be between 19-25. Your Body mass index is 24.43 kg/m. If this is out of the aformentioned range listed, please consider follow up with your Primary Care Provider.   You have been scheduled for an endoscopy. Please follow written instructions given to you at your visit today. If you use inhalers (even only as needed), please bring them with you on the day of your procedure.  It was a pleasure to see you today!  Dr. [name]

## 2019-11-06 ENCOUNTER — Telehealth: Payer: Self-pay

## 2019-11-06 ENCOUNTER — Other Ambulatory Visit: Payer: Self-pay

## 2019-11-06 ENCOUNTER — Ambulatory Visit (AMBULATORY_SURGERY_CENTER): Payer: PPO | Admitting: Gastroenterology

## 2019-11-06 ENCOUNTER — Encounter: Payer: Self-pay | Admitting: Gastroenterology

## 2019-11-06 VITALS — BP 127/76 | HR 67 | Temp 98.2°F | Resp 16 | Ht 67.0 in | Wt 156.0 lb

## 2019-11-06 DIAGNOSIS — K449 Diaphragmatic hernia without obstruction or gangrene: Secondary | ICD-10-CM

## 2019-11-06 DIAGNOSIS — R12 Heartburn: Secondary | ICD-10-CM

## 2019-11-06 MED ORDER — SODIUM CHLORIDE 0.9 % IV SOLN: 500.0000 mL | Freq: Once | INTRAVENOUS | Status: DC

## 2019-11-06 NOTE — Patient Instructions (Signed)
Handout given for hiatal hernia and GERD/anti-reflux measures.  YOU HAD AN ENDOSCOPIC PROCEDURE TODAY AT [facility]:   Refer to the procedure report that was given to you for any specific questions about what was found during the examination.  If the procedure report does not answer your questions, please call your gastroenterologist to clarify.  If you requested that your care partner not be given the details of your procedure findings, then the procedure report has been included in a sealed envelope for you to review at your convenience later.  YOU SHOULD EXPECT: Some feelings of bloating in the abdomen. Passage of more gas than usual.  Walking can help get rid of the air that was put into your GI tract during the procedure and reduce the bloating. If you had a lower endoscopy (such as a colonoscopy or flexible sigmoidoscopy) you may notice spotting of blood in your stool or on the toilet paper. If you underwent a bowel prep for your procedure, you may not have a normal bowel movement for a few days.  Please Note:  You might notice some irritation and congestion in your nose or some drainage.  This is from the oxygen used during your procedure.  There is no need for concern and it should clear up in a day or so.  SYMPTOMS TO REPORT IMMEDIATELY:   Following upper endoscopy (EGD)  Vomiting of blood or coffee ground material  New chest pain or pain under the shoulder blades  Painful or persistently difficult swallowing  New shortness of breath  Fever of 100F or higher  Black, tarry-looking stools  For urgent or emergent issues, a gastroenterologist can be reached at any hour by calling [phone]. Do not use MyChart messaging for urgent concerns.    DIET:  We do recommend a small meal at first, but then you may proceed to your regular diet.  Drink plenty of fluids but you should avoid alcoholic beverages for 24 hours.  ACTIVITY:  You should plan to take it easy for the  rest of today and you should NOT DRIVE or use heavy machinery until tomorrow (because of the sedation medicines used during the test).    FOLLOW UP: Our staff will call the number listed on your records 48-72 hours following your procedure to check on you and address any questions or concerns that you may have regarding the information given to you following your procedure. If we do not reach you, we will leave a message.  We will attempt to reach you two times.  During this call, we will ask if you have developed any symptoms of COVID 19. If you develop any symptoms (ie: fever, flu-like symptoms, shortness of breath, cough etc.) before then, please call [phone].  If you test positive for Covid 19 in the 2 weeks post procedure, please call and report this information to [place].    If any biopsies were taken you will be contacted by phone or by letter within the next 1-3 weeks.  Please call us at [phone] if you have not heard about the biopsies in 3 weeks.    SIGNATURES/CONFIDENTIALITY: You and/or your care partner have signed paperwork which will be entered into your electronic medical record.  These signatures attest to the fact that that the information above on your After Visit Summary has been reviewed and is understood.  Full responsibility of the confidentiality of this discharge information lies with you and/or your care-partner.

## 2019-11-06 NOTE — Telephone Encounter (Signed)
Called patient Dr.Berry advised to schedule appointment with him in the next month or 2.Patient stated he has been advised to have surgery for hiatal hernia.Stated he wants to wait and see how the surgery does.He will call back to schedule appointment if needed.Advised I will make Dr.Berry aware.

## 2019-11-06 NOTE — Op Note (Signed)
[facility] Patient Name: James Braun Procedure Date: 11/06/2019 10:54 AM MRN: [id number] Endoscopist: Mallie Mussel L. [name] , MD Age: 79 Referring MD:  Date of Birth: 03-21-1941 Gender: Male Account #: [id number] Procedure:                Upper GI endoscopy Indications:              Heartburn (see 11/04/19 office consult note for                            details) - persists despite twice daily PPI. no                            dysphagia. Exertional trigger at times -                            cardiologist felt it is not angina - stress test                            12/2017) Medicines:                Monitored Anesthesia Care Procedure:                Pre-Anesthesia Assessment:                           - Prior to the procedure, a History and Physical                            was performed, and patient medications and                            allergies were reviewed. The patient's tolerance of                            previous anesthesia was also reviewed. The risks                            and benefits of the procedure and the sedation                            options and risks were discussed with the patient.                            All questions were answered, and informed consent                            was obtained. Prior Anticoagulants: The patient has                            taken no previous anticoagulant or antiplatelet                            agents. ASA Grade Assessment: II - A patient with  mild systemic disease. After reviewing the risks                            and benefits, the patient was deemed in                            satisfactory condition to undergo the procedure.                           After obtaining informed consent, the endoscope was                            passed under direct vision. Throughout the                            procedure, the patient's blood pressure, pulse, and              oxygen saturations were monitored continuously. The                            Endoscope was introduced through the mouth, and                            advanced to the second part of duodenum. The upper                            GI endoscopy was accomplished without difficulty.                            The patient tolerated the procedure well. Scope In: Scope Out: Findings:                 The lower third of the esophagus was mildly                            tortuous. (due to hiatal hernia) EGJ 33 cm from                            teeth                           A large hiatal hernia was present. Approximately                            one-third of the stomach is herniated. No erosions                            or ulcer seen within hernia sac.                           The exam of the stomach was otherwise normal.                           The examined duodenum was normal. Complications:  No immediate complications. Estimated Blood Loss:     Estimated blood loss: none. Impression:               - Tortuous esophagus.                           - Large hiatal hernia.                           - Normal examined duodenum.                           - No specimens collected. Recommendation:           - Patient has a contact number available for                            emergencies. The signs and symptoms of potential                            delayed complications were discussed with the                            patient. Return to normal activities tomorrow.                            Written discharge instructions were provided to the                            patient.                           - Resume previous diet.                           - Continue present medications.                           - Follow an antireflux regimen indefinitely.                           - Refer to a surgeon at appointment to be                            scheduled. Consult  to discuss hiatal hernia repair. Daneen Volcy L. [name], MD 11/06/2019 11:28:01 AM This report has been signed electronically.

## 2019-11-06 NOTE — Telephone Encounter (Signed)
-----   Message from Doran Stabler, MD sent at 11/06/2019 12:10 PM EDT ----- Vaughan Basta,    Please send a referral to Dr. Alphonsa Overall at [facility] for this patient. Symptomatic hiatal hernia.  Thanks  - HD

## 2019-11-06 NOTE — Telephone Encounter (Signed)
Referral faxed to [facility] 

## 2019-11-06 NOTE — Progress Notes (Signed)
PT taken to PACU. Monitors in place. VSS. Report given to RN. 

## 2019-11-07 ENCOUNTER — Telehealth: Payer: Self-pay | Admitting: Cardiovascular Disease

## 2019-11-07 NOTE — Telephone Encounter (Signed)
See previous phone note with James Braun. Patient is requesting to speak with James Braun again. He would like her advice on whether he should schedule his appt with Dr. [name] before or after his hernia surgery.

## 2019-11-07 NOTE — Telephone Encounter (Signed)
I spoke with patient. He reports he has been having heartburn with exercise. Upper GI showed large hiatal hernia and surgery is recommended. Patient thinks Dr [name] and Dr [name] have spoken about this. Patient is asking if he needs appointment with Dr [name] prior to hiatal hernia surgery

## 2019-11-10 ENCOUNTER — Telehealth: Payer: Self-pay | Admitting: *Deleted

## 2019-11-10 NOTE — Telephone Encounter (Signed)
  Follow up Call-  Call back number 11/06/2019  Post procedure Call Back phone  # [phone]  Permission to leave phone message Yes  Some recent data might be hidden     Patient questions:  Do you have a fever, pain , or abdominal swelling? No. Pain Score  0 *  Have you tolerated food without any problems? Yes.    Have you been able to return to your normal activities? Yes.    Do you have any questions about your discharge instructions: Diet   No. Medications  No. Follow up visit  No.  Do you have questions or concerns about your Care? No.  Actions: * If pain score is 4 or above: No action needed, pain <4.  1. Have you developed a fever since your procedure? no  2.   Have you had an respiratory symptoms (SOB or cough) since your procedure? no  3.   Have you tested positive for COVID 19 since your procedure no  4.   Have you had any family members/close contacts diagnosed with the COVID 19 since your procedure?  no   If yes to any of these questions please route to Joylene John, RN and Erenest Rasher, RN

## 2019-11-11 ENCOUNTER — Other Ambulatory Visit: Payer: Self-pay

## 2019-11-11 ENCOUNTER — Other Ambulatory Visit: Payer: Self-pay | Admitting: Internal Medicine

## 2019-11-11 ENCOUNTER — Encounter: Payer: Self-pay | Admitting: Cardiovascular Disease

## 2019-11-11 ENCOUNTER — Ambulatory Visit (INDEPENDENT_AMBULATORY_CARE_PROVIDER_SITE_OTHER): Payer: PPO | Admitting: Cardiovascular Disease

## 2019-11-11 DIAGNOSIS — D649 Anemia, unspecified: Secondary | ICD-10-CM

## 2019-11-11 DIAGNOSIS — I10 Essential (primary) hypertension: Secondary | ICD-10-CM | POA: Diagnosis not present

## 2019-11-11 DIAGNOSIS — E782 Mixed hyperlipidemia: Secondary | ICD-10-CM

## 2019-11-11 DIAGNOSIS — R079 Chest pain, unspecified: Secondary | ICD-10-CM | POA: Diagnosis not present

## 2019-11-11 MED ORDER — METOPROLOL TARTRATE 50 MG PO TABS
ORAL_TABLET | ORAL | 0 refills | Status: DC
Start: 2019-11-11 — End: 2019-12-08

## 2019-11-11 NOTE — Assessment & Plan Note (Signed)
History of essential hypertension blood pressure measured at 116/71.  He is on diltiazem and Micardis as well as Maxide.

## 2019-11-11 NOTE — Progress Notes (Signed)
Spoke to pt. He is asking if potassium could be checked because he has only been taking 2 potassium tablets daily due to his stomach issues.

## 2019-11-11 NOTE — Patient Instructions (Addendum)
Medication Instructions:  Take Metoprolol 50 mg 2 hours before CT when scheduled   *If you need a refill on your cardiac medications before your next appointment, please call your pharmacy*   Lab Work: BMET one week before CT when scheduled   If you have labs (blood work) drawn today and your tests are completely normal, you will receive your results only by: Marland Kitchen MyChart Message (if you have MyChart) OR . A paper copy in the mail If you have any lab test that is abnormal or we need to change your treatment, we will call you to review the results.   Testing/Procedures: Your physician has requested that you have cardiac CT. Cardiac computed tomography (CT) is a painless test that uses an x-ray machine to take clear, detailed pictures of your heart. For further information please visit [email or web address]. Please follow instruction sheet as given.   Follow-Up: At [facility], you and your health needs are our priority.  As part of our continuing mission to provide you with exceptional heart care, we have created designated Provider Care Teams.  These Care Teams include your primary Cardiologist (physician) and Advanced Practice Providers (APPs -  Physician Assistants and Nurse Practitioners) who all work together to provide you with the care you need, when you need it.  We recommend signing up for the patient portal called "MyChart".  Sign up information is provided on this After Visit Summary.  MyChart is used to connect with patients for Virtual Visits (Telemedicine).  Patients are able to view lab/test results, encounter notes, upcoming appointments, etc.  Non-urgent messages can be sent to your provider as well.   To learn more about what you can do with MyChart, go to [email or web address].    Your next appointment:   6 month(s)  The format for your next appointment:   In Person  Provider:   Quay Burow, MD   Other Instructions   Your cardiac CT will be scheduled at one  of the below locations:   [facility] [address] [place], [place] [address] [phone]  [place] [address] [place], [place] [address] [phone]  If scheduled at [facility], please arrive at the [facility] main entrance of [facility] 30 minutes prior to test start time. Proceed to the [facility] Radiology Department (first floor) to check-in and test prep.  If scheduled at [facility], please arrive 15 mins early for check-in and test prep.  Please follow these instructions carefully (unless otherwise directed):  Hold all erectile dysfunction medications at least 3 days (72 hrs) prior to test.  On the Night Before the Test: . Be sure to Drink plenty of water. . Do not consume any caffeinated/decaffeinated beverages or chocolate 12 hours prior to your test. . Do not take any antihistamines 12 hours prior to your test. . If you take Metformin do not take 24 hours prior to test.  On the Day of the Test: . Drink plenty of water. Do not drink any water within one hour of the test. . Do not eat any food 4 hours prior to the test. . You may take your regular medications prior to the test.  . Take metoprolol (Lopressor) two hours prior to test. . HOLD Furosemide/Hydrochlorothiazide morning of the test. . FEMALES- please wear underwire-free bra if available       After the Test: . Drink plenty of water. . After receiving IV  contrast, you may experience a mild flushed feeling. This is normal. . On occasion, you may experience a mild rash up to 24 hours after the test. This is not dangerous. If this occurs, you can take Benadryl 25 mg and increase your fluid intake. . If you experience trouble breathing, this can be serious. If it is severe call 911 IMMEDIATELY. If it is mild, please call our office. . If you take any of these medications: Glipizide/Metformin,  Avandament, Glucavance, please do not take 48 hours after completing test unless otherwise instructed.   Once we have confirmed authorization from your insurance company, we will call you to set up a date and time for your test.   For non-scheduling related questions, please contact the cardiac imaging nurse navigator should you have any questions/concerns: Marchia Bond, RN Navigator Cardiac Imaging [facility] [phone] office  For scheduling needs, including cancellations and rescheduling, please call [phone].

## 2019-11-11 NOTE — Progress Notes (Signed)
11/11/2019 James Braun   06/25/1941  ZN:[id number]  Primary Physician [name], MD Primary Cardiologist: [name] MD Lupe Carney, [place]  HPI:  James Braun is a 79 y.o.  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 He has a 96% success rate. He was referred by Dr. Annamarie Major for cardiovascular evaluation because of new onset chest pain.  I last saw him in the office 06/17/2019.He does have a history of treated hypertension, diabetes and hyperlipidemia. He is never had a heart attack or stroke. He has no other cardiac risk factors. He had a stressful event in his life 2 and half weeks ago chest pain subsequent to that and daily since. The pain is not necessarily occur with exertion. It occurred randomly lasted minutes at a time without radiation. He does exercise 3 days a week to 30 minutes at a time on the treadmill and does intermittent fasting as well.  I performed nuclear stress testing 01/09/2018 which was low risk and nonischemic and obtain a 2D echocardiogram 01/07/2018 which was essentially unremarkable as well.  He continues to exercise, and is active without limitation.  His only complaint is he does not have the energy that he used to.  He was recently evaluated underwent endoscopy revealing a large hiatal hernia.  Is being considered for laparoscopic hiatal hernia repair.  He was referred back to me by his gastroenterologist because of an exertional component to his chest pain to rule out an ischemic etiology.   Current Meds  Medication Sig  . diltiazem (CARDIZEM CD) 240 MG 24 hr capsule Take 1 capsule (240 mg total) by mouth daily.  Marland Kitchen diltiazem (TIAZAC) 240 MG 24 hr capsule Take 1 capsule (240 mg total) by mouth daily.  . finasteride (PROSCAR) 5 MG tablet Take 1 tablet (5 mg total) by mouth daily.  . metFORMIN (GLUCOPHAGE-XR) 500 MG 24 hr tablet Take 1  tablet (500 mg total) by mouth 2 (two) times daily.  . Multiple Vitamin (MULTIVITAMIN) tablet Take 1 tablet by mouth daily.  Marland Kitchen omeprazole (PRILOSEC) 20 MG capsule Take 1 capsule (20 mg total) by mouth 2 (two) times daily before a meal.  . Potassium Citrate 15 MEQ (1620 MG) TBCR TAKE 1 TABLET BY MOUTH 3 TIMES A DAY  . Red Yeast Rice Extract (RED YEAST RICE PO) Take 1 tablet by mouth daily.   . sildenafil (REVATIO) 20 MG tablet Take 3-5 tablets (60-100 mg total) by mouth daily as needed.  Marland Kitchen telmisartan (MICARDIS) 80 MG tablet Take 1 tablet (80 mg total) by mouth every morning.  . timolol (TIMOPTIC) 0.5 % ophthalmic solution INSTILL 1 DROP TWICE DAILY INTO BOTH EYES  . triamterene-hydrochlorothiazide (MAXZIDE-25) 37.5-25 MG tablet Take 1 tablet by mouth daily.     Allergies  Allergen Reactions  . Atorvastatin Other (See Comments)    REACTION: myalgias  . Morphine And Related Other (See Comments)     hypotension  . Levofloxacin Rash  . Septra [Sulfamethoxazole-Trimethoprim] Rash  . Sulfamethoxazole Rash    Social History   Socioeconomic History  . Marital status: Married    Spouse name: Not on file  . Number of children: 2  . Years of education: Not on file  . Highest education level: Not on file  Occupational History  . 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 Occupation: Buys very distressed houses and  rehabilitates  . Occupation: Does mediation  Tobacco Use  . Smoking status: Never Smoker  . Smokeless tobacco: Never Used  Substance and Sexual Activity  . Alcohol use: No  . Drug use: No  . Sexual activity: Not on file  Other Topics Concern  . Not on file  Social History Narrative   Has living will   Wife is health care POA---alternate is committee of physicians   Would accept resuscitation---wouldn't accept prolonged ventilation   No tube feeds if cognitively unaware   Social Determinants of Health   Financial Resource Strain:   . Difficulty of Paying Living  Expenses:   Food Insecurity:   . Worried About Charity fundraiser in the Last Year:   . Arboriculturist in the Last Year:   Transportation Needs:   . Film/video editor (Medical):   Marland Kitchen Lack of Transportation (Non-Medical):   Physical Activity:   . Days of Exercise per Week:   . Minutes of Exercise per Session:   Stress:   . Feeling of Stress :   Social Connections:   . Frequency of Communication with Friends and Family:   . Frequency of Social Gatherings with Friends and Family:   . Attends Religious Services:   . Active Member of Clubs or Organizations:   . Attends Archivist Meetings:   Marland Kitchen Marital Status:   Intimate Partner Violence:   . Fear of Current or Ex-Partner:   . Emotionally Abused:   Marland Kitchen Physically Abused:   . Sexually Abused:      Review of Systems: General: negative for chills, fever, night sweats or weight changes.  Cardiovascular: negative for chest pain, dyspnea on exertion, edema, orthopnea, palpitations, paroxysmal nocturnal dyspnea or shortness of breath Dermatological: negative for rash Respiratory: negative for cough or wheezing Urologic: negative for hematuria Abdominal: negative for nausea, vomiting, diarrhea, bright red blood per rectum, melena, or hematemesis Neurologic: negative for visual changes, syncope, or dizziness All other systems reviewed and are otherwise negative except as noted above.    Blood pressure 116/71, pulse 85, temperature 97.6 F (36.4 C), resp. rate 20, height 5\' 7"  (1.702 m), weight 151 lb 9.6 oz (68.8 kg), SpO2 97 %.  General appearance: alert and no distress Neck: no adenopathy, no carotid bruit, no JVD, supple, symmetrical, trachea midline and thyroid not enlarged, symmetric, no tenderness/mass/nodules Lungs: clear to auscultation bilaterally Heart: regular rate and rhythm, S1, S2 normal, no murmur, click, rub or gallop Extremities: extremities normal, atraumatic, no cyanosis or edema Pulses: 2+ and  symmetric Skin: Skin color, texture, turgor normal. No rashes or lesions Neurologic: Alert and oriented X 3, normal strength and tone. Normal symmetric reflexes. Normal coordination and gait  EKG not performed today  ASSESSMENT AND PLAN:   Chest pain of uncertain etiology Mr. Buth gets exertional chest pain.  He does have a known large hiatal hernia and is being considered for laparoscopic hiatal hernia repair.  He did have a negative Myoview stress test 01/09/2018.  Since there is an exertional component I am going to get a coronary CTA to further evaluate and to restratify him prior to surgery.  Hyperlipemia History of hyperlipidemia not on statin therapy with lipid profile performed 09/22/2019 revealing total cholesterol 172, LDL of 107 and HDL of 32.  Essential hypertension, benign History of essential hypertension blood pressure measured at 116/71.  He is on diltiazem and Micardis as well as Maxide.      [name] MD FACP,FACC,FAHA,  FSCAI 11/11/2019 4:50 PM

## 2019-11-11 NOTE — Progress Notes (Signed)
Please call him and ask him to come in for some repeat blood studies---to decide if he is iron deficient and whether he should have a colonoscopy

## 2019-11-11 NOTE — Assessment & Plan Note (Signed)
Mr. Coutee gets exertional chest pain.  He does have a known large hiatal hernia and is being considered for laparoscopic hiatal hernia repair.  He did have a negative Myoview stress test 01/09/2018.  Since there is an exertional component I am going to get a coronary CTA to further evaluate and to restratify him prior to surgery.

## 2019-11-11 NOTE — Assessment & Plan Note (Signed)
History of hyperlipidemia not on statin therapy with lipid profile performed 09/22/2019 revealing total cholesterol 172, LDL of 107 and HDL of 32.

## 2019-11-12 ENCOUNTER — Other Ambulatory Visit: Payer: Self-pay | Admitting: Internal Medicine

## 2019-11-12 ENCOUNTER — Telehealth: Payer: Self-pay | Admitting: Internal Medicine

## 2019-11-12 ENCOUNTER — Telehealth: Payer: Self-pay | Admitting: Cardiovascular Disease

## 2019-11-12 DIAGNOSIS — I10 Essential (primary) hypertension: Secondary | ICD-10-CM

## 2019-11-12 NOTE — Telephone Encounter (Signed)
Patient called and wanted to let Dr. [name] know that Dr. [name] is OK with doing the required blood work. He just wanted to know how to get the results from those labs to Dr. [name]. Please advise

## 2019-11-12 NOTE — Telephone Encounter (Signed)
Spoke to pt. Dr [name] had ordered a BMET. The renal panel Dr [name] ordered is a BMET plus some other items.

## 2019-11-12 NOTE — Telephone Encounter (Signed)
Will route to MD to advise- blood work was drawn, not in the system yet, but will make him aware. Thank you!

## 2019-11-12 NOTE — Telephone Encounter (Signed)
Called and spoke with pt, notified that since Dr. [name] is part of our system we can see the labs he draws in his chart so he should not have to fax anything else to [place]. Notified if there was something else they needed to send they could contact our office. Pt verbalized understanding and had no other questions at this time.

## 2019-11-12 NOTE — Telephone Encounter (Signed)
Patient dropped off lab orders from [facility].  Patient has labs scheduled for tomorrow at our office and wants to know if these orders can be added.  Paperwork in rx tower.

## 2019-11-13 ENCOUNTER — Other Ambulatory Visit: Payer: Self-pay

## 2019-11-13 ENCOUNTER — Telehealth: Payer: Self-pay

## 2019-11-13 ENCOUNTER — Other Ambulatory Visit (INDEPENDENT_AMBULATORY_CARE_PROVIDER_SITE_OTHER): Payer: PPO

## 2019-11-13 DIAGNOSIS — R079 Chest pain, unspecified: Secondary | ICD-10-CM

## 2019-11-13 DIAGNOSIS — I10 Essential (primary) hypertension: Secondary | ICD-10-CM | POA: Diagnosis not present

## 2019-11-13 DIAGNOSIS — D649 Anemia, unspecified: Secondary | ICD-10-CM

## 2019-11-13 DIAGNOSIS — R072 Precordial pain: Secondary | ICD-10-CM

## 2019-11-13 LAB — IBC PANEL
Iron: 20 ug/dL — ABNORMAL LOW (ref 42–165)
Saturation Ratios: 4.5 % — ABNORMAL LOW (ref 20.0–50.0)
Transferrin: 321 mg/dL (ref 212.0–360.0)

## 2019-11-13 LAB — CBC WITH DIFFERENTIAL/PLATELET
Basophils Absolute: 0.1 10*3/uL (ref 0.0–0.1)
Basophils Relative: 1.1 % (ref 0.0–3.0)
Eosinophils Absolute: 0.2 10*3/uL (ref 0.0–0.7)
Eosinophils Relative: 3.9 % (ref 0.0–5.0)
HCT: 31.1 % — ABNORMAL LOW (ref 39.0–52.0)
Hemoglobin: 9.9 g/dL — ABNORMAL LOW (ref 13.0–17.0)
Lymphocytes Relative: 18.6 % (ref 12.0–46.0)
Lymphs Abs: 0.9 10*3/uL (ref 0.7–4.0)
MCHC: 31.8 g/dL (ref 30.0–36.0)
MCV: 72.1 fl — ABNORMAL LOW (ref 78.0–100.0)
Monocytes Absolute: 0.6 10*3/uL (ref 0.1–1.0)
Monocytes Relative: 12.8 % — ABNORMAL HIGH (ref 3.0–12.0)
Neutro Abs: 2.9 10*3/uL (ref 1.4–7.7)
Neutrophils Relative %: 63.6 % (ref 43.0–77.0)
Platelets: 338 10*3/uL (ref 150.0–400.0)
RBC: 4.31 Mil/uL (ref 4.22–5.81)
RDW: 20.3 % — ABNORMAL HIGH (ref 11.5–15.5)
WBC: 4.6 10*3/uL (ref 4.0–10.5)

## 2019-11-13 LAB — RENAL FUNCTION PANEL
Albumin: 4.1 g/dL (ref 3.5–5.2)
BUN: 27 mg/dL — ABNORMAL HIGH (ref 6–23)
CO2: 31 mEq/L (ref 19–32)
Calcium: 8.9 mg/dL (ref 8.4–10.5)
Chloride: 102 mEq/L (ref 96–112)
Creatinine, Ser: 1.2 mg/dL (ref 0.40–1.50)
GFR: 58.3 mL/min — ABNORMAL LOW (ref >60.00)
Glucose, Bld: 124 mg/dL — ABNORMAL HIGH (ref 70–99)
Phosphorus: 3.2 mg/dL (ref 2.3–4.6)
Potassium: 3.5 mEq/L (ref 3.5–5.1)
Sodium: 141 mEq/L (ref 135–145)

## 2019-11-13 LAB — FOLATE: Folate: >23.7 ng/mL (ref >5.9)

## 2019-11-13 LAB — FERRITIN: Ferritin: 5.8 ng/mL — ABNORMAL LOW (ref 22.0–322.0)

## 2019-11-13 LAB — IRON: Iron: 20 ug/dL — ABNORMAL LOW (ref 42–165)

## 2019-11-13 LAB — VITAMIN B12: Vitamin B-12: >1500 pg/mL — ABNORMAL HIGH (ref 211–911)

## 2019-11-13 NOTE — Telephone Encounter (Signed)
Spoke to patient's wife.Dr.Berry has ordered a [employer].Advised scheduler will call back with appointment.

## 2019-11-18 ENCOUNTER — Ambulatory Visit: Payer: PPO | Admitting: Gastroenterology

## 2019-11-18 ENCOUNTER — Telehealth (HOSPITAL_COMMUNITY): Payer: Self-pay

## 2019-11-18 NOTE — Telephone Encounter (Signed)
Encounter complete. 

## 2019-11-19 ENCOUNTER — Ambulatory Visit (HOSPITAL_COMMUNITY)
Admission: RE | Admit: 2019-11-19 | Discharge: 2019-11-19 | Disposition: A | Discharge status: Home / Self Care | Payer: PPO | Source: Ambulatory Visit | Attending: Cardiovascular Disease | Admitting: Cardiovascular Disease

## 2019-11-19 ENCOUNTER — Telehealth: Payer: Self-pay | Admitting: *Deleted

## 2019-11-19 ENCOUNTER — Other Ambulatory Visit: Payer: Self-pay

## 2019-11-19 DIAGNOSIS — R072 Precordial pain: Secondary | ICD-10-CM | POA: Diagnosis not present

## 2019-11-19 LAB — MYOCARDIAL PERFUSION IMAGING
LV dias vol: 141 mL (ref 62–150)
LV sys vol: 89 mL
Peak HR: 87 {beats}/min
Rest HR: 70 {beats}/min
SDS: 3
SRS: 29
SSS: 32
TID: 1.16

## 2019-11-19 MED ORDER — TECHNETIUM TC 99M TETROFOSMIN IV KIT
31.2000 | PACK | Freq: Once | INTRAVENOUS | Status: AC | PRN
  Administered 2019-11-19: 31.2 via INTRAVENOUS
  Filled 2019-11-19: qty 32

## 2019-11-19 MED ORDER — TECHNETIUM TC 99M TETROFOSMIN IV KIT
10.6000 | PACK | Freq: Once | INTRAVENOUS | Status: AC | PRN
  Administered 2019-11-19: 13:00:00 10.6 via INTRAVENOUS
  Filled 2019-11-19: qty 11

## 2019-11-19 MED ORDER — REGADENOSON 0.4 MG/5ML IV SOLN
0.4000 mg | Freq: Once | INTRAVENOUS | Status: AC
Start: 2019-11-19 — End: 2019-11-19
  Administered 2019-11-19: 0.4 mg via INTRAVENOUS

## 2019-11-19 MED ORDER — AMINOPHYLLINE 25 MG/ML IV SOLN: 75.0000 mg | Freq: Once | INTRAVENOUS | Status: DC

## 2019-11-19 NOTE — Telephone Encounter (Signed)
Dr berry reviewed nuclear images from today and there have been changes since the one he had in 2019. Dr berry would like to see the patient in the office Friday 11/21/19. Left message for pt to call

## 2019-11-21 ENCOUNTER — Ambulatory Visit: Payer: PPO | Admitting: Cardiovascular Disease

## 2019-11-21 ENCOUNTER — Encounter: Payer: Self-pay | Admitting: Cardiovascular Disease

## 2019-11-21 ENCOUNTER — Other Ambulatory Visit: Payer: Self-pay

## 2019-11-21 VITALS — BP 124/74 | HR 71 | Ht 66.0 in | Wt 155.0 lb

## 2019-11-21 DIAGNOSIS — R9439 Abnormal result of other cardiovascular function study: Secondary | ICD-10-CM

## 2019-11-21 MED ORDER — SODIUM CHLORIDE 0.9% FLUSH: 3.0000 mL | Freq: Two times a day (BID) | INTRAVENOUS | Status: DC

## 2019-11-21 MED ORDER — ISOSORBIDE MONONITRATE ER 30 MG PO TB24: 15.0000 mg | ORAL_TABLET | Freq: Every day | ORAL | 3 refills | Status: DC

## 2019-11-21 NOTE — Progress Notes (Signed)
James Braun returns today for follow-up of his Myoview stress test performed 11/19/2019 to evaluate chest pain.  This showed dense inferior scar without ischemia.  When compared to his last Myoview performed 01/09/2018, this is a new finding.  He has atypical chest pain with an exertional component.  In the past its been attributed to GI etiology but given the change in his Myoview I feel compelled to proceed with outpatient diagnostic coronary angiography.  I Minna start him on Imdur 15 mg a day.  I have reviewed the risks, indications, and alternatives to cardiac catheterization, possible angioplasty, and stenting with the patient. Risks include but are not limited to bleeding, infection, vascular injury, stroke, myocardial infection, arrhythmia, kidney injury, radiation-related injury in the case of prolonged fluoroscopy use, emergency cardiac surgery, and death. The patient understands the risks of serious complication is 1-2 in 123XX123 with diagnostic cardiac cath and 1-2% or less with angioplasty/stenting.   [name], M.D., [place], [facility], [place] [address] [place], [place]  [address]  [phone] 11/21/2019 9:57 AM

## 2019-11-21 NOTE — H&P (View-Only) (Signed)
Mr. Popelka returns today for follow-up of his Myoview stress test performed 11/19/2019 to evaluate chest pain.  This showed dense inferior scar without ischemia.  When compared to his last Myoview performed 01/09/2018, this is a new finding.  He has atypical chest pain with an exertional component.  In the past its been attributed to GI etiology but given the change in his Myoview I feel compelled to proceed with outpatient diagnostic coronary angiography.  I Minna start him on Imdur 15 mg a day.  I have reviewed the risks, indications, and alternatives to cardiac catheterization, possible angioplasty, and stenting with the patient. Risks include but are not limited to bleeding, infection, vascular injury, stroke, myocardial infection, arrhythmia, kidney injury, radiation-related injury in the case of prolonged fluoroscopy use, emergency cardiac surgery, and death. The patient understands the risks of serious complication is 1-2 in 123XX123 with diagnostic cardiac cath and 1-2% or less with angioplasty/stenting.   [name], M.D., [place], [facility], [place] [address] [place], [place]  [address]  [phone] 11/21/2019 9:57 AM

## 2019-11-21 NOTE — Patient Instructions (Addendum)
    [place] [address] Dept: [phone] Loc: [phone]  RIDA STEENO  11/21/2019  You are scheduled for a Cardiac Catheterization on Thursday, May 13 with Dr. Quay Burow.  1. Please arrive at the [facility] (Main Entrance A) at [facility]: [address] [place], [place] [address] at 11:30 AM (This time is two hours before your procedure to ensure your preparation). Free valet parking service is available.   Special note: Every effort is made to have your procedure done on time. Please understand that emergencies sometimes delay scheduled procedures.  2. Diet: Do not eat solid foods after midnight.  The patient may have clear liquids until 5am upon the day of the procedure.  3. Labs: You will need to have blood drawn on today  GO TO [place] Monday 11/24/19 @ 12:20 PM FOR COVID TESTING  4. Medication instructions in preparation for your procedure:  DO NOT TAKE METFORMIN Thursday 5/13, Friday 5/14 AND Saturday 5/15- RESTART Sunday 5/16  On the morning of your procedure, take your Aspirin and any morning medicines NOT listed above.  You may use sips of water.  5. Plan for one night stay--bring personal belongings. 6. Bring a current list of your medications and current insurance cards. 7. You MUST have a responsible person to drive you home. 8. Someone MUST be with you the first 24 hours after you arrive home or your discharge will be delayed. 9. Please wear clothes that are easy to get on and off and wear slip-on shoes.  Thank you for allowing [place] to care for you!   -- [facility]  START ISOSORBIDE 15 MG ONCE DAILY= 1/2 OF THE 30 MG TABLET ONCE DAILY  Your physician recommends that you schedule a follow-up appointment in: 2 [place]

## 2019-11-22 LAB — BASIC METABOLIC PANEL
BUN/Creatinine Ratio: 16 (ref 10–24)
BUN: 21 mg/dL (ref 8–27)
CO2: 26 mmol/L (ref 20–29)
Calcium: 9.4 mg/dL (ref 8.6–10.2)
Chloride: 99 mmol/L (ref 96–106)
Creatinine, Ser: 1.31 mg/dL — ABNORMAL HIGH (ref 0.76–1.27)
GFR calc Af Amer: 59 mL/min/{1.73_m2} — ABNORMAL LOW (ref >59)
GFR calc non Af Amer: 51 mL/min/{1.73_m2} — ABNORMAL LOW (ref >59)
Glucose: 145 mg/dL — ABNORMAL HIGH (ref 65–99)
Potassium: 4.3 mmol/L (ref 3.5–5.2)
Sodium: 139 mmol/L (ref 134–144)

## 2019-11-22 LAB — CBC
Hematocrit: 35 % — ABNORMAL LOW (ref 37.5–51.0)
Hemoglobin: 10.8 g/dL — ABNORMAL LOW (ref 13.0–17.7)
MCH: 22.8 pg — ABNORMAL LOW (ref 26.6–33.0)
MCHC: 30.9 g/dL — ABNORMAL LOW (ref 31.5–35.7)
MCV: 74 fL — ABNORMAL LOW (ref 79–97)
Platelets: 387 10*3/uL (ref 150–450)
RBC: 4.74 x10E6/uL (ref 4.14–5.80)
RDW: 19.1 % — ABNORMAL HIGH (ref 11.6–15.4)
WBC: 6 10*3/uL (ref 3.4–10.8)

## 2019-11-24 ENCOUNTER — Other Ambulatory Visit (HOSPITAL_COMMUNITY)
Admission: RE | Admit: 2019-11-24 | Discharge: 2019-11-24 | Disposition: A | Discharge status: Home / Self Care | Payer: PPO | Source: Ambulatory Visit | Attending: Psychiatry | Admitting: Psychiatry

## 2019-11-24 DIAGNOSIS — D62 Acute posthemorrhagic anemia: Secondary | ICD-10-CM | POA: Diagnosis not present

## 2019-11-24 DIAGNOSIS — I970 Postcardiotomy syndrome: Secondary | ICD-10-CM | POA: Diagnosis not present

## 2019-11-24 DIAGNOSIS — E876 Hypokalemia: Secondary | ICD-10-CM | POA: Diagnosis not present

## 2019-11-24 DIAGNOSIS — Z951 Presence of aortocoronary bypass graft: Secondary | ICD-10-CM | POA: Diagnosis not present

## 2019-11-24 DIAGNOSIS — Z01812 Encounter for preprocedural laboratory examination: Secondary | ICD-10-CM | POA: Insufficient documentation

## 2019-11-24 DIAGNOSIS — J90 Pleural effusion, not elsewhere classified: Secondary | ICD-10-CM | POA: Diagnosis not present

## 2019-11-24 DIAGNOSIS — I5023 Acute on chronic systolic (congestive) heart failure: Secondary | ICD-10-CM | POA: Diagnosis not present

## 2019-11-24 DIAGNOSIS — Z7984 Long term (current) use of oral hypoglycemic drugs: Secondary | ICD-10-CM | POA: Diagnosis not present

## 2019-11-24 DIAGNOSIS — J9811 Atelectasis: Secondary | ICD-10-CM | POA: Diagnosis not present

## 2019-11-24 DIAGNOSIS — E785 Hyperlipidemia, unspecified: Secondary | ICD-10-CM | POA: Diagnosis present

## 2019-11-24 DIAGNOSIS — N183 Chronic kidney disease, stage 3 unspecified: Secondary | ICD-10-CM | POA: Diagnosis not present

## 2019-11-24 DIAGNOSIS — I2582 Chronic total occlusion of coronary artery: Secondary | ICD-10-CM | POA: Diagnosis present

## 2019-11-24 DIAGNOSIS — I255 Ischemic cardiomyopathy: Secondary | ICD-10-CM | POA: Diagnosis present

## 2019-11-24 DIAGNOSIS — I480 Paroxysmal atrial fibrillation: Secondary | ICD-10-CM | POA: Diagnosis not present

## 2019-11-24 DIAGNOSIS — Z87442 Personal history of urinary calculi: Secondary | ICD-10-CM | POA: Diagnosis not present

## 2019-11-24 DIAGNOSIS — I25110 Atherosclerotic heart disease of native coronary artery with unstable angina pectoris: Secondary | ICD-10-CM | POA: Diagnosis not present

## 2019-11-24 DIAGNOSIS — I493 Ventricular premature depolarization: Secondary | ICD-10-CM | POA: Diagnosis not present

## 2019-11-24 DIAGNOSIS — Z01818 Encounter for other preprocedural examination: Secondary | ICD-10-CM | POA: Diagnosis not present

## 2019-11-24 DIAGNOSIS — F329 Major depressive disorder, single episode, unspecified: Secondary | ICD-10-CM | POA: Diagnosis not present

## 2019-11-24 DIAGNOSIS — R570 Cardiogenic shock: Secondary | ICD-10-CM | POA: Diagnosis not present

## 2019-11-24 DIAGNOSIS — Z79899 Other long term (current) drug therapy: Secondary | ICD-10-CM | POA: Diagnosis not present

## 2019-11-24 DIAGNOSIS — I361 Nonrheumatic tricuspid (valve) insufficiency: Secondary | ICD-10-CM | POA: Diagnosis not present

## 2019-11-24 DIAGNOSIS — I252 Old myocardial infarction: Secondary | ICD-10-CM | POA: Diagnosis not present

## 2019-11-24 DIAGNOSIS — I6529 Occlusion and stenosis of unspecified carotid artery: Secondary | ICD-10-CM | POA: Diagnosis not present

## 2019-11-24 DIAGNOSIS — Z01810 Encounter for preprocedural cardiovascular examination: Secondary | ICD-10-CM | POA: Diagnosis not present

## 2019-11-24 DIAGNOSIS — K449 Diaphragmatic hernia without obstruction or gangrene: Secondary | ICD-10-CM | POA: Diagnosis present

## 2019-11-24 DIAGNOSIS — I509 Heart failure, unspecified: Secondary | ICD-10-CM | POA: Diagnosis not present

## 2019-11-24 DIAGNOSIS — I5021 Acute systolic (congestive) heart failure: Secondary | ICD-10-CM | POA: Diagnosis not present

## 2019-11-24 DIAGNOSIS — E1122 Type 2 diabetes mellitus with diabetic chronic kidney disease: Secondary | ICD-10-CM | POA: Diagnosis not present

## 2019-11-24 DIAGNOSIS — E119 Type 2 diabetes mellitus without complications: Secondary | ICD-10-CM | POA: Diagnosis not present

## 2019-11-24 DIAGNOSIS — N179 Acute kidney failure, unspecified: Secondary | ICD-10-CM | POA: Diagnosis not present

## 2019-11-24 DIAGNOSIS — I2510 Atherosclerotic heart disease of native coronary artery without angina pectoris: Secondary | ICD-10-CM | POA: Diagnosis present

## 2019-11-24 DIAGNOSIS — T8119XA Other postprocedural shock, initial encounter: Secondary | ICD-10-CM | POA: Diagnosis not present

## 2019-11-24 DIAGNOSIS — Z20822 Contact with and (suspected) exposure to covid-19: Secondary | ICD-10-CM | POA: Diagnosis not present

## 2019-11-24 DIAGNOSIS — I130 Hypertensive heart and chronic kidney disease with heart failure and stage 1 through stage 4 chronic kidney disease, or unspecified chronic kidney disease: Secondary | ICD-10-CM | POA: Diagnosis not present

## 2019-11-24 DIAGNOSIS — D689 Coagulation defect, unspecified: Secondary | ICD-10-CM | POA: Diagnosis not present

## 2019-11-24 DIAGNOSIS — D631 Anemia in chronic kidney disease: Secondary | ICD-10-CM | POA: Diagnosis not present

## 2019-11-24 DIAGNOSIS — N400 Enlarged prostate without lower urinary tract symptoms: Secondary | ICD-10-CM | POA: Diagnosis present

## 2019-11-24 LAB — SARS CORONAVIRUS 2 (TAT 6-24 HRS): SARS Coronavirus 2: NEGATIVE

## 2019-11-24 NOTE — Telephone Encounter (Signed)
Patient has been seen.

## 2019-11-25 ENCOUNTER — Telehealth: Payer: Self-pay | Admitting: *Deleted

## 2019-11-25 NOTE — Telephone Encounter (Signed)
Pt contacted pre-catheterization scheduled at [facility] for: Thursday Nov 27, 2019 1:30 PM Verified arrival time and place: [facility]) at: 11:30 AM   No solid food after midnight prior to cath, clear liquids until 5 AM day of procedure.  Hold: Triamterene-HCT-day before and day of procedure-GFR [place] before and day of procedure. Sildenafil-pt states he does not use. Metformin-day of procedure and 48 hours post procedure.  Except hold medications AM meds can be  taken pre-cath with sip of water including: ASA 81 mg   Confirmed patient has responsible adult to drive home post procedure and observe 24 hours after arriving home: yes  You are allowed ONE visitor in the waiting room during your procedures. Both you and your visitor must wear masks.      COVID-19 Pre-Screening Questions:  . In the past 7 to 10 days have you had a cough,  shortness of breath, headache, congestion, fever (100 or greater) body aches, chills, sore throat, or sudden loss of taste or sense of smell? no . Have you been around anyone with known Covid 19 in the past 7 to 10 days? no . Have you been around anyone who is awaiting Covid 19 test results in the past 7 to 10 days? no . Have you been around anyone who has mentioned symptoms of Covid 19 within the past 7 to 10 days? no  Reviewed procedure/mask/visitor instructions, COVID-19 screening questions with patient.

## 2019-11-27 ENCOUNTER — Encounter (HOSPITAL_COMMUNITY)
Admission: RE | Disposition: A | Discharge status: Home / Self Care | Payer: Self-pay | Source: Home / Self Care | Attending: Cardiothoracic Surgery

## 2019-11-27 ENCOUNTER — Inpatient Hospital Stay (HOSPITAL_COMMUNITY): Payer: PPO

## 2019-11-27 ENCOUNTER — Inpatient Hospital Stay (HOSPITAL_COMMUNITY)
Admission: RE | Admit: 2019-11-27 | Discharge: 2019-12-08 | DRG: 233 | Disposition: A | Discharge status: Home / Self Care | Payer: PPO | Attending: Cardiothoracic Surgery | Admitting: Cardiothoracic Surgery

## 2019-11-27 ENCOUNTER — Other Ambulatory Visit: Payer: Self-pay

## 2019-11-27 ENCOUNTER — Other Ambulatory Visit: Payer: Self-pay | Admitting: *Deleted

## 2019-11-27 DIAGNOSIS — I252 Old myocardial infarction: Secondary | ICD-10-CM

## 2019-11-27 DIAGNOSIS — Z8249 Family history of ischemic heart disease and other diseases of the circulatory system: Secondary | ICD-10-CM

## 2019-11-27 DIAGNOSIS — T8119XA Other postprocedural shock, initial encounter: Secondary | ICD-10-CM | POA: Diagnosis not present

## 2019-11-27 DIAGNOSIS — Z01810 Encounter for preprocedural cardiovascular examination: Secondary | ICD-10-CM

## 2019-11-27 DIAGNOSIS — Z7984 Long term (current) use of oral hypoglycemic drugs: Secondary | ICD-10-CM | POA: Diagnosis not present

## 2019-11-27 DIAGNOSIS — I2510 Atherosclerotic heart disease of native coronary artery without angina pectoris: Secondary | ICD-10-CM

## 2019-11-27 DIAGNOSIS — N179 Acute kidney failure, unspecified: Secondary | ICD-10-CM | POA: Diagnosis not present

## 2019-11-27 DIAGNOSIS — I480 Paroxysmal atrial fibrillation: Secondary | ICD-10-CM

## 2019-11-27 DIAGNOSIS — J939 Pneumothorax, unspecified: Secondary | ICD-10-CM

## 2019-11-27 DIAGNOSIS — Z951 Presence of aortocoronary bypass graft: Secondary | ICD-10-CM | POA: Diagnosis not present

## 2019-11-27 DIAGNOSIS — I255 Ischemic cardiomyopathy: Secondary | ICD-10-CM | POA: Diagnosis present

## 2019-11-27 DIAGNOSIS — I2582 Chronic total occlusion of coronary artery: Secondary | ICD-10-CM | POA: Diagnosis present

## 2019-11-27 DIAGNOSIS — Z20822 Contact with and (suspected) exposure to covid-19: Secondary | ICD-10-CM | POA: Diagnosis present

## 2019-11-27 DIAGNOSIS — I361 Nonrheumatic tricuspid (valve) insufficiency: Secondary | ICD-10-CM | POA: Diagnosis not present

## 2019-11-27 DIAGNOSIS — E119 Type 2 diabetes mellitus without complications: Secondary | ICD-10-CM | POA: Diagnosis not present

## 2019-11-27 DIAGNOSIS — I5023 Acute on chronic systolic (congestive) heart failure: Secondary | ICD-10-CM | POA: Diagnosis not present

## 2019-11-27 DIAGNOSIS — I5021 Acute systolic (congestive) heart failure: Secondary | ICD-10-CM | POA: Diagnosis not present

## 2019-11-27 DIAGNOSIS — Z85820 Personal history of malignant melanoma of skin: Secondary | ICD-10-CM

## 2019-11-27 DIAGNOSIS — E876 Hypokalemia: Secondary | ICD-10-CM | POA: Diagnosis not present

## 2019-11-27 DIAGNOSIS — I493 Ventricular premature depolarization: Secondary | ICD-10-CM | POA: Diagnosis not present

## 2019-11-27 DIAGNOSIS — Z881 Allergy status to other antibiotic agents status: Secondary | ICD-10-CM

## 2019-11-27 DIAGNOSIS — D689 Coagulation defect, unspecified: Secondary | ICD-10-CM | POA: Diagnosis not present

## 2019-11-27 DIAGNOSIS — Z09 Encounter for follow-up examination after completed treatment for conditions other than malignant neoplasm: Secondary | ICD-10-CM

## 2019-11-27 DIAGNOSIS — N400 Enlarged prostate without lower urinary tract symptoms: Secondary | ICD-10-CM | POA: Diagnosis present

## 2019-11-27 DIAGNOSIS — E785 Hyperlipidemia, unspecified: Secondary | ICD-10-CM | POA: Diagnosis present

## 2019-11-27 DIAGNOSIS — N183 Chronic kidney disease, stage 3 unspecified: Secondary | ICD-10-CM | POA: Diagnosis present

## 2019-11-27 DIAGNOSIS — I5022 Chronic systolic (congestive) heart failure: Secondary | ICD-10-CM

## 2019-11-27 DIAGNOSIS — I25110 Atherosclerotic heart disease of native coronary artery with unstable angina pectoris: Secondary | ICD-10-CM | POA: Diagnosis present

## 2019-11-27 DIAGNOSIS — Z79899 Other long term (current) drug therapy: Secondary | ICD-10-CM | POA: Diagnosis not present

## 2019-11-27 DIAGNOSIS — D62 Acute posthemorrhagic anemia: Secondary | ICD-10-CM | POA: Diagnosis not present

## 2019-11-27 DIAGNOSIS — I130 Hypertensive heart and chronic kidney disease with heart failure and stage 1 through stage 4 chronic kidney disease, or unspecified chronic kidney disease: Secondary | ICD-10-CM | POA: Diagnosis present

## 2019-11-27 DIAGNOSIS — Z87442 Personal history of urinary calculi: Secondary | ICD-10-CM

## 2019-11-27 DIAGNOSIS — F329 Major depressive disorder, single episode, unspecified: Secondary | ICD-10-CM | POA: Diagnosis not present

## 2019-11-27 DIAGNOSIS — Z888 Allergy status to other drugs, medicaments and biological substances status: Secondary | ICD-10-CM

## 2019-11-27 DIAGNOSIS — I970 Postcardiotomy syndrome: Secondary | ICD-10-CM | POA: Diagnosis not present

## 2019-11-27 DIAGNOSIS — J9811 Atelectasis: Secondary | ICD-10-CM

## 2019-11-27 DIAGNOSIS — K449 Diaphragmatic hernia without obstruction or gangrene: Secondary | ICD-10-CM | POA: Diagnosis present

## 2019-11-27 DIAGNOSIS — Z885 Allergy status to narcotic agent status: Secondary | ICD-10-CM

## 2019-11-27 DIAGNOSIS — Z833 Family history of diabetes mellitus: Secondary | ICD-10-CM

## 2019-11-27 DIAGNOSIS — R9439 Abnormal result of other cardiovascular function study: Secondary | ICD-10-CM | POA: Insufficient documentation

## 2019-11-27 DIAGNOSIS — R570 Cardiogenic shock: Secondary | ICD-10-CM | POA: Diagnosis not present

## 2019-11-27 DIAGNOSIS — E1122 Type 2 diabetes mellitus with diabetic chronic kidney disease: Secondary | ICD-10-CM | POA: Diagnosis present

## 2019-11-27 DIAGNOSIS — R079 Chest pain, unspecified: Secondary | ICD-10-CM | POA: Diagnosis present

## 2019-11-27 HISTORY — PX: LEFT HEART CATH AND CORONARY ANGIOGRAPHY: CATH118249

## 2019-11-27 HISTORY — DX: Atherosclerotic heart disease of native coronary artery without angina pectoris: I25.10

## 2019-11-27 LAB — URINALYSIS, ROUTINE W REFLEX MICROSCOPIC
Bilirubin Urine: NEGATIVE
Glucose, UA: 50 mg/dL — AB
Hgb urine dipstick: NEGATIVE
Ketones, ur: NEGATIVE mg/dL
Leukocytes,Ua: NEGATIVE
Nitrite: NEGATIVE
Protein, ur: NEGATIVE mg/dL
Specific Gravity, Urine: 1.024 (ref 1.005–1.030)
pH: 7 (ref 5.0–8.0)

## 2019-11-27 LAB — HEPATIC FUNCTION PANEL
ALT: 20 U/L (ref 0–44)
AST: 21 U/L (ref 15–41)
Albumin: 3.9 g/dL (ref 3.5–5.0)
Alkaline Phosphatase: 45 U/L (ref 38–126)
Bilirubin, Direct: <0.1 mg/dL (ref 0.0–0.2)
Total Bilirubin: 0.5 mg/dL (ref 0.3–1.2)
Total Protein: 6.7 g/dL (ref 6.5–8.1)

## 2019-11-27 LAB — PROTIME-INR
INR: 1.1 (ref 0.8–1.2)
Prothrombin Time: 13.6 seconds (ref 11.4–15.2)

## 2019-11-27 LAB — PULMONARY FUNCTION TEST
FEF 25-75 Pre: 2.82 L/sec
FEF2575-%Pred-Pre: 161 %
FEV1-%Pred-Pre: 105 %
FEV1-Pre: 2.66 L
FEV1FVC-%Pred-Pre: 113 %
FEV6-%Pred-Pre: 98 %
FEV6-Pre: 3.28 L
FEV6FVC-%Pred-Pre: 107 %
FVC-%Pred-Pre: 91 %
FVC-Pre: 3.28 L
Pre FEV1/FVC ratio: 81 %
Pre FEV6/FVC Ratio: 100 %

## 2019-11-27 LAB — ECHOCARDIOGRAM COMPLETE
Height: 67 in
Weight: 2480 oz

## 2019-11-27 LAB — SURGICAL PCR SCREEN
MRSA, PCR: NEGATIVE
Staphylococcus aureus: NEGATIVE

## 2019-11-27 LAB — GLUCOSE, CAPILLARY: Glucose-Capillary: 127 mg/dL — ABNORMAL HIGH (ref 70–99)

## 2019-11-27 LAB — ABO/RH: ABO/RH(D): O POS

## 2019-11-27 LAB — PREPARE RBC (CROSSMATCH)

## 2019-11-27 LAB — HEMOGLOBIN A1C
Hgb A1c MFr Bld: 7.2 % — ABNORMAL HIGH (ref 4.8–5.6)
Mean Plasma Glucose: 159.94 mg/dL

## 2019-11-27 SURGERY — LEFT HEART CATH AND CORONARY ANGIOGRAPHY
Anesthesia: LOCAL

## 2019-11-27 MED ORDER — ALPRAZOLAM 0.25 MG PO TABS: 0.2500 mg | ORAL_TABLET | ORAL | Status: DC | PRN

## 2019-11-27 MED ORDER — LIDOCAINE HCL (PF) 1 % IJ SOLN
INTRAMUSCULAR | Status: DC | PRN
  Administered 2019-11-27: 2 mL

## 2019-11-27 MED ORDER — SODIUM CHLORIDE 0.9% FLUSH: 3.0000 mL | INTRAVENOUS | Status: DC | PRN

## 2019-11-27 MED ORDER — DIAZEPAM 2 MG PO TABS
2.0000 mg | ORAL_TABLET | Freq: Once | ORAL | Status: AC
  Administered 2019-11-28: 2 mg via ORAL
  Filled 2019-11-27: qty 1

## 2019-11-27 MED ORDER — POTASSIUM CHLORIDE 2 MEQ/ML IV SOLN
80.0000 meq | INTRAVENOUS | Status: DC
  Filled 2019-11-27: qty 40

## 2019-11-27 MED ORDER — TEMAZEPAM 15 MG PO CAPS: 15.0000 mg | ORAL_CAPSULE | Freq: Once | ORAL | Status: DC | PRN

## 2019-11-27 MED ORDER — SODIUM CHLORIDE 0.9 % IV SOLN: 250.0000 mL | INTRAVENOUS | Status: DC | PRN

## 2019-11-27 MED ORDER — HEPARIN SODIUM (PORCINE) 1000 UNIT/ML IJ SOLN
INTRAMUSCULAR | Status: DC | PRN
  Administered 2019-11-27: 3500 [IU] via INTRAVENOUS

## 2019-11-27 MED ORDER — NITROGLYCERIN IN D5W 200-5 MCG/ML-% IV SOLN
2.0000 ug/min | INTRAVENOUS | Status: AC
  Administered 2019-11-28: 16.6 ug/min via INTRAVENOUS
  Filled 2019-11-27 (×2): qty 250

## 2019-11-27 MED ORDER — IRBESARTAN 150 MG PO TABS: 150.0000 mg | ORAL_TABLET | Freq: Every day | ORAL | Status: DC

## 2019-11-27 MED ORDER — FENTANYL CITRATE (PF) 100 MCG/2ML IJ SOLN
INTRAMUSCULAR | Status: AC
  Filled 2019-11-27: qty 2

## 2019-11-27 MED ORDER — SODIUM CHLORIDE 0.9 % IV SOLN: INTRAVENOUS | Status: AC

## 2019-11-27 MED ORDER — EPINEPHRINE HCL 5 MG/250ML IV SOLN IN NS
0.0000 ug/min | INTRAVENOUS | Status: AC
  Administered 2019-11-28: 2 ug/min via INTRAVENOUS
  Filled 2019-11-27 (×2): qty 250

## 2019-11-27 MED ORDER — FINASTERIDE 5 MG PO TABS
5.0000 mg | ORAL_TABLET | Freq: Every evening | ORAL | Status: DC
  Administered 2019-11-27: 5 mg via ORAL
  Filled 2019-11-27: qty 1

## 2019-11-27 MED ORDER — FENTANYL CITRATE (PF) 100 MCG/2ML IJ SOLN
INTRAMUSCULAR | Status: DC | PRN
  Administered 2019-11-27: 25 ug via INTRAVENOUS

## 2019-11-27 MED ORDER — TIMOLOL MALEATE 0.5 % OP SOLN
1.0000 [drp] | Freq: Two times a day (BID) | OPHTHALMIC | Status: DC
  Administered 2019-11-27 – 2019-12-08 (×21): 1 [drp] via OPHTHALMIC
  Filled 2019-11-27 (×2): qty 5

## 2019-11-27 MED ORDER — VANCOMYCIN HCL 1250 MG/250ML IV SOLN
1250.0000 mg | INTRAVENOUS | Status: AC
  Administered 2019-11-28: 1250 mg via INTRAVENOUS
  Filled 2019-11-27 (×2): qty 250

## 2019-11-27 MED ORDER — INSULIN REGULAR(HUMAN) IN NACL 100-0.9 UT/100ML-% IV SOLN
INTRAVENOUS | Status: AC
  Administered 2019-11-28: 2 [IU]/h via INTRAVENOUS
  Filled 2019-11-27: qty 100

## 2019-11-27 MED ORDER — TRIAMTERENE-HCTZ 37.5-25 MG PO TABS
1.0000 | ORAL_TABLET | Freq: Every day | ORAL | Status: DC
  Administered 2019-11-27: 1 via ORAL
  Filled 2019-11-27 (×2): qty 1

## 2019-11-27 MED ORDER — NOREPINEPHRINE 4 MG/250ML-% IV SOLN
0.0000 ug/min | INTRAVENOUS | Status: AC
  Administered 2019-11-28: 3 ug/min via INTRAVENOUS
  Filled 2019-11-27 (×2): qty 250

## 2019-11-27 MED ORDER — NITROGLYCERIN 1 MG/10 ML FOR IR/CATH LAB
INTRA_ARTERIAL | Status: AC
  Filled 2019-11-27: qty 10

## 2019-11-27 MED ORDER — DEXMEDETOMIDINE HCL IN NACL 400 MCG/100ML IV SOLN
0.1000 ug/kg/h | INTRAVENOUS | Status: AC
  Administered 2019-11-28: .7 ug/kg/h via INTRAVENOUS
  Filled 2019-11-27 (×2): qty 100

## 2019-11-27 MED ORDER — CHLORHEXIDINE GLUCONATE 4 % EX LIQD
60.0000 mL | Freq: Once | CUTANEOUS | Status: AC
  Administered 2019-11-27: 4 via TOPICAL
  Filled 2019-11-27: qty 60

## 2019-11-27 MED ORDER — VERAPAMIL HCL 2.5 MG/ML IV SOLN
INTRAVENOUS | Status: AC
  Filled 2019-11-27: qty 2

## 2019-11-27 MED ORDER — ISOSORBIDE MONONITRATE ER 30 MG PO TB24: 15.0000 mg | ORAL_TABLET | Freq: Every day | ORAL | Status: DC

## 2019-11-27 MED ORDER — METOPROLOL TARTRATE 25 MG PO TABS
25.0000 mg | ORAL_TABLET | Freq: Two times a day (BID) | ORAL | Status: DC
  Administered 2019-11-27: 25 mg via ORAL
  Filled 2019-11-27: qty 1

## 2019-11-27 MED ORDER — ACETAMINOPHEN 325 MG PO TABS: 650.0000 mg | ORAL_TABLET | ORAL | Status: DC | PRN

## 2019-11-27 MED ORDER — MILRINONE LACTATE IN DEXTROSE 20-5 MG/100ML-% IV SOLN
0.3000 ug/kg/min | INTRAVENOUS | Status: AC
  Administered 2019-11-28: .25 ug/kg/min via INTRAVENOUS
  Filled 2019-11-27: qty 100

## 2019-11-27 MED ORDER — ONDANSETRON HCL 4 MG/2ML IJ SOLN: 4.0000 mg | Freq: Four times a day (QID) | INTRAMUSCULAR | Status: DC | PRN

## 2019-11-27 MED ORDER — DILTIAZEM HCL ER COATED BEADS 240 MG PO CP24
240.0000 mg | ORAL_CAPSULE | Freq: Every day | ORAL | Status: DC
  Filled 2019-11-27: qty 1

## 2019-11-27 MED ORDER — MIDAZOLAM HCL 2 MG/2ML IJ SOLN
INTRAMUSCULAR | Status: DC | PRN
  Administered 2019-11-27: 1 mg via INTRAVENOUS

## 2019-11-27 MED ORDER — SODIUM CHLORIDE 0.9 % WEIGHT BASED INFUSION
3.0000 mL/kg/h | INTRAVENOUS | Status: DC
  Administered 2019-11-27: 3 mL/kg/h via INTRAVENOUS

## 2019-11-27 MED ORDER — CHLORHEXIDINE GLUCONATE 4 % EX LIQD
60.0000 mL | Freq: Once | CUTANEOUS | Status: AC
  Administered 2019-11-28: 4 via TOPICAL
  Filled 2019-11-27: qty 60

## 2019-11-27 MED ORDER — LIDOCAINE HCL (PF) 1 % IJ SOLN
INTRAMUSCULAR | Status: AC
  Filled 2019-11-27: qty 30

## 2019-11-27 MED ORDER — HEPARIN (PORCINE) IN NACL 1000-0.9 UT/500ML-% IV SOLN
INTRAVENOUS | Status: DC | PRN
  Administered 2019-11-27 (×2): 500 mL

## 2019-11-27 MED ORDER — PANTOPRAZOLE SODIUM 40 MG PO TBEC: 40.0000 mg | DELAYED_RELEASE_TABLET | Freq: Every day | ORAL | Status: DC

## 2019-11-27 MED ORDER — TRANEXAMIC ACID (OHS) BOLUS VIA INFUSION
15.0000 mg/kg | INTRAVENOUS | Status: AC
  Administered 2019-11-28: 1054.5 mg via INTRAVENOUS
  Filled 2019-11-27: qty 1055

## 2019-11-27 MED ORDER — BISACODYL 5 MG PO TBEC: 5.0000 mg | DELAYED_RELEASE_TABLET | Freq: Once | ORAL | Status: DC

## 2019-11-27 MED ORDER — ASPIRIN 81 MG PO CHEW: 81.0000 mg | CHEWABLE_TABLET | Freq: Every day | ORAL | Status: DC

## 2019-11-27 MED ORDER — SODIUM CHLORIDE 0.9 % IV SOLN
750.0000 mg | INTRAVENOUS | Status: AC
  Administered 2019-11-28: 750 mg via INTRAVENOUS
  Filled 2019-11-27: qty 750

## 2019-11-27 MED ORDER — SODIUM CHLORIDE 0.9 % WEIGHT BASED INFUSION
1.0000 mL/kg/h | INTRAVENOUS | Status: DC
  Administered 2019-11-27: 1 mL/kg/h via INTRAVENOUS

## 2019-11-27 MED ORDER — IOHEXOL 350 MG/ML SOLN
INTRAVENOUS | Status: DC | PRN
  Administered 2019-11-27: 60 mL

## 2019-11-27 MED ORDER — HYDRALAZINE HCL 20 MG/ML IJ SOLN: 10.0000 mg | INTRAMUSCULAR | Status: AC | PRN

## 2019-11-27 MED ORDER — TRANEXAMIC ACID 1000 MG/10ML IV SOLN
1.5000 mg/kg/h | INTRAVENOUS | Status: AC
  Administered 2019-11-28: 1.5 mg/kg/h via INTRAVENOUS
  Filled 2019-11-27: qty 25

## 2019-11-27 MED ORDER — TRANEXAMIC ACID (OHS) PUMP PRIME SOLUTION
2.0000 mg/kg | INTRAVENOUS | Status: DC
  Filled 2019-11-27: qty 1.41

## 2019-11-27 MED ORDER — METOPROLOL TARTRATE 12.5 MG HALF TABLET
12.5000 mg | ORAL_TABLET | Freq: Once | ORAL | Status: AC
  Administered 2019-11-28: 12.5 mg via ORAL
  Filled 2019-11-27: qty 1

## 2019-11-27 MED ORDER — SODIUM CHLORIDE 0.9 % IV SOLN
1.5000 g | INTRAVENOUS | Status: AC
  Administered 2019-11-28: 1.5 g via INTRAVENOUS
  Filled 2019-11-27: qty 1.5

## 2019-11-27 MED ORDER — ASPIRIN 81 MG PO CHEW: 81.0000 mg | CHEWABLE_TABLET | ORAL | Status: DC

## 2019-11-27 MED ORDER — CHLORHEXIDINE GLUCONATE 0.12 % MT SOLN
15.0000 mL | Freq: Once | OROMUCOSAL | Status: AC
  Administered 2019-11-28: 15 mL via OROMUCOSAL
  Filled 2019-11-27: qty 15

## 2019-11-27 MED ORDER — VERAPAMIL HCL 2.5 MG/ML IV SOLN
INTRA_ARTERIAL | Status: DC | PRN
  Administered 2019-11-27: 10 mL via INTRA_ARTERIAL

## 2019-11-27 MED ORDER — HEPARIN (PORCINE) IN NACL 1000-0.9 UT/500ML-% IV SOLN
INTRAVENOUS | Status: AC
  Filled 2019-11-27: qty 1000

## 2019-11-27 MED ORDER — LABETALOL HCL 5 MG/ML IV SOLN: 10.0000 mg | INTRAVENOUS | Status: AC | PRN

## 2019-11-27 MED ORDER — MAGNESIUM SULFATE 50 % IJ SOLN
40.0000 meq | INTRAMUSCULAR | Status: DC
  Filled 2019-11-27: qty 9.85

## 2019-11-27 MED ORDER — HEPARIN SODIUM (PORCINE) 1000 UNIT/ML IJ SOLN
INTRAMUSCULAR | Status: AC
  Filled 2019-11-27: qty 1

## 2019-11-27 MED ORDER — PHENYLEPHRINE HCL-NACL 20-0.9 MG/250ML-% IV SOLN
30.0000 ug/min | INTRAVENOUS | Status: AC
  Administered 2019-11-28: 25 ug/min via INTRAVENOUS
  Filled 2019-11-27 (×2): qty 250

## 2019-11-27 MED ORDER — SODIUM CHLORIDE 0.9% FLUSH: 3.0000 mL | Freq: Two times a day (BID) | INTRAVENOUS | Status: DC

## 2019-11-27 MED ORDER — MIDAZOLAM HCL 2 MG/2ML IJ SOLN
INTRAMUSCULAR | Status: AC
  Filled 2019-11-27: qty 2

## 2019-11-27 MED ORDER — PLASMA-LYTE 148 IV SOLN
INTRAVENOUS | Status: DC
  Filled 2019-11-27: qty 2.5

## 2019-11-27 MED ORDER — SODIUM CHLORIDE 0.9 % IV SOLN
INTRAVENOUS | Status: DC
  Filled 2019-11-27: qty 30

## 2019-11-27 SURGICAL SUPPLY — 12 items
CATH INFINITI 5FR ANG PIGTAIL (CATHETERS) ×1 IMPLANT
CATH OPTITORQUE TIG 4.0 5F (CATHETERS) ×1 IMPLANT
DEVICE RAD COMP TR BAND LRG (VASCULAR PRODUCTS) ×1 IMPLANT
GLIDESHEATH SLEND A-KIT 6F 22G (SHEATH) ×1 IMPLANT
GUIDEWIRE INQWIRE 1.5J.035X260 (WIRE) IMPLANT
INQWIRE 1.5J .035X260CM (WIRE) ×2
KIT HEART LEFT (KITS) ×2 IMPLANT
PACK CARDIAC CATHETERIZATION (CUSTOM PROCEDURE TRAY) ×2 IMPLANT
SYR MEDRAD MARK 7 150ML (SYRINGE) ×2 IMPLANT
TRANSDUCER W/STOPCOCK (MISCELLANEOUS) ×2 IMPLANT
TUBING CIL FLEX 10 FLL-RA (TUBING) ×2 IMPLANT
WIRE HI TORQ VERSACORE-J 145CM (WIRE) ×1 IMPLANT

## 2019-11-27 NOTE — Progress Notes (Signed)
Pre-CABG Dopplers completed. Refer to "CV Proc" under chart review to view preliminary results.  11/27/2019 5:41 PM Kelby Aline., MHA, RVT, RDCS, RDMS

## 2019-11-27 NOTE — Anesthesia Preprocedure Evaluation (Addendum)
Anesthesia Evaluation  Patient identified by MRN, date of birth, ID band Patient awake    Reviewed: Allergy & Precautions, NPO status , Patient's Chart, lab work & pertinent test results, reviewed documented beta blocker date and time   Airway Mallampati: II  TM Distance: >3 FB Neck ROM: Full    Dental  (+) Caps,    Pulmonary neg pulmonary ROS,    Pulmonary exam normal breath sounds clear to auscultation       Cardiovascular hypertension, Pt. on medications and Pt. on home beta blockers + CAD and +CHF  Normal cardiovascular exam Rhythm:Regular Rate:Normal  ECG: SR, rate 76  Myocardial Perfusion: Nuclear stress EF: 37%. The left ventricular ejection fraction is moderately decreased (30-44%). There was no ST segment deviation noted during stress. Defect 1: There is a large defect of severe severity present in the basal inferoseptal, basal inferior, basal inferolateral, mid inferoseptal, mid inferior, mid inferolateral, apical anterior, apical septal, apical inferior, apical lateral and apex location. Findings consistent with prior myocardial infarction. There is no evidence of ischemia . This is an intermediate risk study.    CATH: Mr. Picking has severe two-vessel disease with high-grade  proximal LAD disease, ostial second diagonal branch disease and severe high-grade tandem lesions in a tortuous dominant RCA with grade 2-3  left-to-right collaterals. He does have moderate least severe LV dysfunction with an EF in the 30 to 35% range and more prominent inferior wall motion abnormality. I suspect his LV dysfunction is ischemically  mediated and represents hibernating myocardium.   Neuro/Psych negative neurological ROS  negative psych ROS   GI/Hepatic Neg liver ROS, GERD  Medicated and Controlled,  Endo/Other  diabetes, Oral Hypoglycemic Agents  Renal/GU negative Renal ROS     Musculoskeletal negative musculoskeletal  ROS (+)   Abdominal   Peds  Hematology  (+) anemia ,   Anesthesia Other Findings CAD  Reproductive/Obstetrics                            Anesthesia Physical Anesthesia Plan  ASA: IV  Anesthesia Plan: General   Post-op Pain Management:    Induction: Intravenous  PONV Risk Score and Plan: 2 and Ondansetron, Dexamethasone, Midazolam and Treatment may vary due to age or medical condition  Airway Management Planned: Oral ETT  Additional Equipment: Arterial line, CVP, PA Cath, TEE and Ultrasound Guidance Line Placement  Intra-op Plan:   Post-operative Plan: Post-operative intubation/ventilation  Informed Consent: I have reviewed the patients History and Physical, chart, labs and discussed the procedure including the risks, benefits and alternatives for the proposed anesthesia with the patient or authorized representative who has indicated his/her understanding and acceptance.     Dental advisory given  Plan Discussed with: CRNA  Anesthesia Plan Comments:        Anesthesia Quick Evaluation

## 2019-11-27 NOTE — Progress Notes (Signed)
  Echocardiogram 2D Echocardiogram has been performed.  Jennette Dubin 11/27/2019, 4:39 PM

## 2019-11-27 NOTE — Interval H&P Note (Signed)
Cath Lab Visit (complete for each Cath Lab visit)  Clinical Evaluation Leading to the Procedure:   ACS: No.  Non-ACS:    Anginal Classification: CCS II  Anti-ischemic medical therapy: Minimal Therapy (1 class of medications)  Non-Invasive Test Results: Intermediate-risk stress test findings: cardiac mortality 1-3%/year  Prior CABG: No previous CABG      History and Physical Interval Note:  11/27/2019 2:30 PM  James Braun  has presented today for surgery, with the diagnosis of abnormal nuclear stress test.  The various methods of treatment have been discussed with the patient and family. After consideration of risks, benefits and other options for treatment, the patient has consented to  Procedure(s): LEFT HEART CATH AND CORONARY ANGIOGRAPHY (N/A) as a surgical intervention.  The patient's history has been reviewed, patient examined, no change in status, stable for surgery.  I have reviewed the patient's chart and labs.  Questions were answered to the patient's satisfaction.     Quay Burow

## 2019-11-27 NOTE — Consult Note (Signed)
[address]       [place] [address]             [id number]        James Braun [place] Medical Record [id number] Date of Birth: [birth year]  Referring: No ref. provider found Primary Care: [name], MD Primary Cardiologist:Jonathan [name], MD  Chief Complaint: Chest pain, fatigue  History of Present Illness:     Patient examined, images of coronary angiogram, echocardiogram and carotid Doppler images all personally reviewed and discussed with patient.  79 year old diabetic non-smoker with recent history of severe chest pain for 36 hours followed by persistent pain of lesser intensity. Today he underwent outpatient cardiac catheterization which showed occlusion of a dominant RCA, high-grade stenosis of the LAD-diagonal.  LVEDP 19 mmHg EF 35% with inferior wall hypokinesia.  Echocardiogram was performed which shows moderate LV dysfunction, no significant valve disease noted with final report pending.  The patient is a non-smoker and PFTs are satisfactory.  Patient has hypertension, diabetes with A1c 7.2 and dyslipidemia with intolerance of statin.  The patient is very functional and still works and lives with his wife and his very active.    Current Activity/ Functional Status: Good functional activity until recent chest pain and malaise   Zubrod Score: At the time of surgery this patient's most appropriate activity status/level should be described as: []     0    Normal activity, no symptoms []     1    Restricted in physical strenuous activity but ambulatory, able to do out light work [x]     2    Ambulatory and capable of self care, unable to do work activities, up and about                 more than 50%  Of the time                            []     3    Only limited self care, in bed greater than 50% of waking hours []     4    Completely disabled, no self care, confined to bed or chair []     5    Moribund  Past Medical History:  Diagnosis  Date  . BPH (benign prostatic hypertrophy)   . History of kidney stones   . History of melanoma excision OCT 2010  . Hyperlipidemia   . Hypertension   . Left ureteral calculus   . Nephrolithiasis    bilateral  . Type 2 diabetes mellitus ([place])     Past Surgical History:  Procedure Laterality Date  . CHOLECYSTECTOMY  july 2000  . CYSTOSCOPY WITH STENT PLACEMENT Left 11/14/2012   Procedure: CYSTOSCOPY WITH STENT PLACEMENT;  Surgeon: [name], MD;  Location: [facility];  Service: Urology;  Laterality: Left;  . CYSTOSCOPY/RETROGRADE/URETEROSCOPY/STONE EXTRACTION WITH BASKET Left 11/14/2012   Procedure: CYSTOSCOPY/RETROGRADE/URETEROSCOPY/STONE EXTRACTION WITH BASKET;  Surgeon: [name], MD;  Location: [facility];  Service: Urology;  Laterality: Left;  . EXTRACORPOREAL SHOCK WAVE LITHOTRIPSY  JULY 2000   X2  . HOLMIUM LASER APPLICATION Left [id number]   Procedure: HOLMIUM LASER APPLICATION;  Surgeon: [name], MD;  Location: [facility];  Service: Urology;  Laterality: Left;  . LEFT URETEROSCOPIC STONE EXTRACTION  02-22-2007  . NEPHROLITHOTOMY Right 09-28-2010   percutaneous  . ORIF CLAVICLE FRACTURE  2006  .  PROSTATE BIOPSY  1998   neg  . SHOULDER ARTHROSCOPY  1/15   Dr [name]  . TONSILLECTOMY      Social History   Tobacco Use  Smoking Status Never Smoker  Smokeless Tobacco Never Used    Social History   Substance and Sexual Activity  Alcohol Use No     Allergies  Allergen Reactions  . Atorvastatin Other (See Comments)    myalgias  . Morphine And Related Other (See Comments)     hypotension  . Levofloxacin Rash  . Septra [Sulfamethoxazole-Trimethoprim] Rash    Current Facility-Administered Medications  Medication Dose Route Frequency Provider Last Rate Last Admin  . 0.9 %  sodium chloride infusion   Intravenous Continuous [name], MD 75 mL/hr at 11/27/19 1510 Rate Change at 11/27/19 1510    . 0.9 %  sodium chloride infusion  250 mL Intravenous PRN [name], MD      . acetaminophen (TYLENOL) tablet 650 mg  650 mg Oral Q4H PRN [name], MD      . Derrill Memo ON 11/28/2019] aspirin chewable tablet 81 mg  81 mg Oral Daily [name], MD      . Derrill Memo ON 11/28/2019] diltiazem (TIAZAC) 24 hr capsule 240 mg  240 mg Oral Daily [name], MD      . finasteride (PROSCAR) tablet 5 mg  5 mg Oral QPM [name], MD      . hydrALAZINE (APRESOLINE) injection 10 mg  10 mg Intravenous Q20 Min PRN [name], MD      . Derrill Memo ON 11/28/2019] irbesartan (AVAPRO) tablet 150 mg  150 mg Oral Daily [name], MD      . Derrill Memo ON 11/28/2019] isosorbide mononitrate (IMDUR) 24 hr tablet 15 mg  15 mg Oral Daily [name], MD      . labetalol (NORMODYNE) injection 10 mg  10 mg Intravenous Q10 min PRN [name], MD      . metoprolol tartrate (LOPRESSOR) tablet 25 mg  25 mg Oral BID [name], MD      . ondansetron [facility]) injection 4 mg  4 mg Intravenous Q6H PRN [name], MD      . Derrill Memo ON 11/28/2019] pantoprazole (PROTONIX) EC tablet 40 mg  40 mg Oral Daily [name], MD      . sodium chloride flush (NS) 0.9 % injection 3 mL  3 mL Intravenous Q12H [name], MD      . sodium chloride flush (NS) 0.9 % injection 3 mL  3 mL Intravenous PRN [name], MD      . timolol (TIMOPTIC) 0.5 % ophthalmic solution 1 drop  1 drop Both Eyes BID [name], MD      . triamterene-hydrochlorothiazide (MAXZIDE-25) 37.5-25 MG per tablet 1 tablet  1 tablet Oral Daily [name], MD        Facility-Administered Medications Prior to Admission  Medication Dose Route Frequency Provider Last Rate Last Admin  . sodium chloride flush (NS) 0.9 % injection 3 mL  3 mL Intravenous Q12H [name], MD       Medications Prior to Admission  Medication Sig Dispense Refill Last Dose  . alum hydroxide-mag trisilicate  (GAVISCON) [id number] MG CHEW chewable tablet Chew 2 tablets by mouth 3 (three) times daily as needed for indigestion or heartburn.   11/27/2019 at Unknown time  . diltiazem (TIAZAC) 240 MG 24 hr  capsule Take 1 capsule (240 mg total) by mouth daily. 90 capsule 3 11/27/2019 at Unknown time  . finasteride (PROSCAR) 5 MG tablet Take 1 tablet (5 mg total) by mouth daily. 90 tablet 3 11/26/2019 at Unknown time  . GLUCOSAMINE-CHONDROITIN PO Take 1 tablet by mouth 2 (two) times daily.   11/26/2019 at Unknown time  . metFORMIN (GLUCOPHAGE-XR) 500 MG 24 hr tablet Take 1 tablet (500 mg total) by mouth 2 (two) times daily. 180 tablet 3 11/25/2019 at Unknown time  . Multiple Vitamin (MULTIVITAMIN) tablet Take 1 tablet by mouth daily.   11/27/2019 at Unknown time  . omeprazole (PRILOSEC) 20 MG capsule Take 1 capsule (20 mg total) by mouth 2 (two) times daily before a meal. (Patient taking differently: Take 20 mg by mouth daily. ) 60 capsule 1 11/27/2019 at Unknown time  . Potassium Citrate 15 MEQ (1620 MG) TBCR TAKE 1 TABLET BY MOUTH 3 TIMES A DAY (Patient taking differently: Take 1 tablet by mouth 3 (three) times daily. ) 270 tablet 3 11/26/2019 at Unknown time  . Red Yeast Rice Extract (RED YEAST RICE PO) Take 1 tablet by mouth daily.    11/26/2019 at Unknown time  . sodium chloride (MURO 128) 5 % ophthalmic solution Place 1 drop into both eyes daily.   11/27/2019 at Unknown time  . telmisartan (MICARDIS) 80 MG tablet Take 1 tablet (80 mg total) by mouth every morning. 90 tablet 3 11/27/2019 at Unknown time  . timolol (TIMOPTIC) 0.5 % ophthalmic solution Place 1 drop into both eyes 2 (two) times daily.    Past Week at Unknown time  . triamterene-hydrochlorothiazide (MAXZIDE-25) 37.5-25 MG tablet Take 1 tablet by mouth daily. 90 tablet 3 11/25/2019 at Unknown time  . isosorbide mononitrate (IMDUR) 30 MG 24 hr tablet Take 0.5 tablets (15 mg total) by mouth daily. 45 tablet 3   . metoprolol tartrate (LOPRESSOR) 50 MG tablet Take 1  tablet by mouth once for procedure. 1 tablet 0   . sildenafil (REVATIO) 20 MG tablet Take 3-5 tablets (60-100 mg total) by mouth daily as needed. (Patient taking differently: Take 20 mg by mouth daily as needed (ed). ) 50 tablet 11     Family History  Problem Relation Age of Onset  . Hypertension Mother   . Diabetes Father   . Hypertension Father   . Coronary artery disease Neg Hx   . Cancer Neg Hx      Review of Systems:   ROS Right-hand-dominant No history of thoracic trauma pneumothorax or rib fracture No history of bleeding  disorder, blood transfusion.  History of mild anemia hemoglobin 10.8    Cardiac Review of Systems: Y or  [    ]= no  Chest Pain [  y  ]  Resting SOB [   ] Exertional SOB  [  ]  Orthopnea [  ]   Pedal Edema [   ]    Palpitations [  ] Syncope  [  ]   Presyncope [   ]  General Review of Systems: [Y] = yes [  ]=no Constitional: recent weight change [  ]; anorexia [  ]; fatigue [ y ]; nausea [  ]; night sweats [  ]; fever [  ]; or chills [  ]  Dental: Last Dentist visit: Every 6 months  Eye : blurred vision [  ]; diplopia [   ]; vision changes [  ];  Amaurosis fugax[  ]; Resp: cough [  ];  wheezing[  ];  hemoptysis[  ]; shortness of breath[  ]; paroxysmal nocturnal dyspnea[  ]; dyspnea on exertion[  ]; or orthopnea[  ];  GI:  gallstones[  ], vomiting[  ];  dysphagia[  ]; melena[  ];  hematochezia [  ]; heartburn[  ];   Hx of  Colonoscopy[  ]; GU: kidney stones Blue.Reese  ]; hematuria[  ];   dysuria [  ];  nocturia[  y];  history of     obstruction Blue.Reese  ]; urinary frequency [  ]             Skin: rash, swelling[  ];, hair loss[  ];  peripheral edema[  ];  or itching[  ]; Musculosketetal: myalgias[  ];  joint swelling[  ];  joint erythema[  ];  joint pain[  ];  back pain[  ];  Heme/Lymph: bruising[  ];  bleeding[  ];  anemia[  ];  Neuro: TIA[  ];  headaches[  ];  stroke[  ];  vertigo[  ];  seizures[  ];    paresthesias[  ];  difficulty walking[  ];  Psych:depression[  ]; anxiety[  ];  Endocrine: diabetes[ y ];  thyroid dysfunction[  ];               Physical Exam: BP 138/78   Pulse 87   Temp 98 F (36.7 C) (Oral)   Resp 19   Ht 5\' 7"  (1.702 m)   Wt 70.3 kg   SpO2 100%   BMI 24.28 kg/m       Physical Exam  General: Intelligent well-nourished male who appears younger than his stated age no acute distress HEENT: Normocephalic pupils equal , dentition adequate Neck: Supple without JVD, adenopathy, or bruit Chest: Clear to auscultation, symmetrical breath sounds, no rhonchi, no tenderness             or deformity Cardiovascular: Regular rate and rhythm, no murmur, no gallop, peripheral pulses             palpable in all extremities Abdomen:  Soft, nontender, no palpable mass or organomegaly Extremities: Warm, well-perfused, no clubbing cyanosis edema or tenderness,              no venous stasis changes of the legs Rectal/GU: Deferred Neuro: Grossly non--focal and symmetrical throughout Skin: Clean and dry without rash or ulceration    Diagnostic Studies & Laboratory data:     Recent Radiology Findings:   CARDIAC CATHETERIZATION  Result Date: 11/27/2019  Prox LAD to Mid LAD lesion is 95% stenosed.  3rd Diag lesion is 90% stenosed.  Prox RCA lesion is 95% stenosed.  Mid RCA lesion is 95% stenosed with 95% stenosed side branch in RV Branch.  Mid RCA to Dist RCA lesion is 95% stenosed.  There is moderate to severe left ventricular systolic dysfunction.  LV end diastolic pressure is mildly elevated.  The left ventricular ejection fraction is 25-35% by visual estimate.  James Braun is a 79 y.o. male  ZN:[id number] LOCATION:  FACILITY: [facility] PHYSICIAN: Quay Burow, M.D. 06-05-1941 DATE OF PROCEDURE:  11/27/2019 DATE OF DISCHARGE: CARDIAC CATHETERIZATION History obtained from chart review.James Braun is a 79 y.o.  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 He has a 96% success rate. He was referred by Dr. Annamarie Major for cardiovascular evaluation because of new onset chest pain.I last saw him in the office 06/17/2019.He does have a history of treated hypertension, diabetes and hyperlipidemia. He is never had a heart attack or stroke. He has no other cardiac risk factors. He had a stressful event in his life 2 and half weeks ago chest pain subsequent to that and daily since. The pain is not necessarily occur with exertion. Itoccurredrandomly lastedminutes at a time without radiation. He does exercise 3 days a week to 30 minutes at a time on the treadmill and does intermittent fasting as well.  I performed nuclear stress testing 01/09/2018 which was low risk and nonischemic and obtain a 2D echocardiogram 01/07/2018 which was essentially unremarkable as well. He continues to exercise, and is active without limitation. His only complaint is he does not have the energy that he used to.  He was recently evaluated underwent endoscopy revealing a large hiatal hernia.  Is being considered for laparoscopic hiatal hernia repair.  He was referred back to me by his gastroenterologist because of an exertional component to his chest pain to rule out an ischemic etiology.  A Myoview stress test showed inferior scar without ischemia clearly different than his last stress test done 2 years ago.  Because of this I elected to proceed with outpatient diagnostic coronary angiography to define his anatomy.   Mr. Sollman has severe two-vessel disease with high-grade proximal LAD disease, ostial second diagonal branch disease and severe high-grade tandem lesions in a tortuous dominant RCA with grade 2-3 left-to-right collaterals.  He does have moderate least severe LV dysfunction with an EF in the 30 to 35% range and more prominent inferior wall motion abnormality.  I suspect his LV dysfunction is  ischemically mediated and represents hibernating myocardium.  He will need CABG.  He is on aspirin.  T CTS (Dr. Darcey Nora) has been consulted.  The sheath was removed and a TR band was placed on the right wrist to achieve patent hemostasis.  The patient left the lab in stable condition. Quay Burow. MD, [facility] 11/27/2019 3:11 PM     I have independently reviewed the above radiologic studies and discussed with the patient   Recent Lab Findings: Lab Results  Component Value Date   WBC 6.0 11/21/2019   HGB 10.8 (L) 11/21/2019   HCT 35.0 (L) 11/21/2019   PLT 387 11/21/2019   GLUCOSE 145 (H) 11/21/2019   CHOL 172 09/22/2019   TRIG 164.0 (H) 09/22/2019   HDL 32.40 (L) 09/22/2019   LDLDIRECT 141.5 02/03/2013   LDLCALC 107 (H) 09/22/2019   ALT 16 03/05/2019   AST 16 03/05/2019   NA 139 11/21/2019   K 4.3 11/21/2019   CL 99 11/21/2019   CREATININE 1.31 (H) 11/21/2019   BUN 21 11/21/2019   CO2 26 11/21/2019   TSH 2.84 02/03/2013   HGBA1C 7.5 (H) 09/22/2019      Assessment / Plan:   Severe two-vessel coronary disease with recent inferior wall MI, symptomatic class III-IV angina  Diabetes Hypertension Stage III chronic kidney disease, creatinine 1.3  The patient will be prepared for multivessel CABG in a.m. with bypass graft to the LAD diagonal and posterior descending.  I have discussed the procedure in detail with the patient and his wife including the expected benefits and the potential risks.  He demonstrates his understanding agrees to  proceed with surgery tomorrow.   Dahlia Byes MD

## 2019-11-27 NOTE — Plan of Care (Signed)
Initiated Care Plan  Problem: Education: Goal: Knowledge of General Education information will improve Description: Including pain rating scale, medication(s)/side effects and non-pharmacologic comfort measures Outcome: Progressing   Problem: Health Behavior/Discharge Planning: Goal: Ability to manage health-related needs will improve Outcome: Progressing   Problem: Clinical Measurements: Goal: Ability to maintain clinical measurements within normal limits will improve Outcome: Progressing Goal: Will remain free from infection Outcome: Progressing Goal: Diagnostic test results will improve Outcome: Progressing Goal: Respiratory complications will improve Outcome: Progressing Goal: Cardiovascular complication will be avoided Outcome: Progressing   Problem: Activity: Goal: Risk for activity intolerance will decrease Outcome: Progressing   Problem: Nutrition: Goal: Adequate nutrition will be maintained Outcome: Progressing   Problem: Coping: Goal: Level of anxiety will decrease Outcome: Progressing   Problem: Elimination: Goal: Will not experience complications related to bowel motility Outcome: Progressing Goal: Will not experience complications related to urinary retention Outcome: Progressing   Problem: Pain Managment: Goal: General experience of comfort will improve Outcome: Progressing   Problem: Safety: Goal: Ability to remain free from injury will improve Outcome: Progressing   Problem: Skin Integrity: Goal: Risk for impaired skin integrity will decrease Outcome: Progressing   Problem: Education: Goal: Will demonstrate proper wound care and an understanding of methods to prevent future damage Outcome: Progressing Goal: Knowledge of disease or condition will improve Outcome: Progressing Goal: Knowledge of the prescribed therapeutic regimen will improve Outcome: Progressing Goal: Individualized Educational Video(s) Outcome: Progressing   Problem:  Activity: Goal: Risk for activity intolerance will decrease Outcome: Progressing   Problem: Cardiac: Goal: Will achieve and/or maintain hemodynamic stability Outcome: Progressing   Problem: Clinical Measurements: Goal: Postoperative complications will be avoided or minimized Outcome: Progressing   Problem: Respiratory: Goal: Respiratory status will improve Outcome: Progressing   Problem: Skin Integrity: Goal: Wound healing without signs and symptoms of infection Outcome: Progressing Goal: Risk for impaired skin integrity will decrease Outcome: Progressing   Problem: Urinary Elimination: Goal: Ability to achieve and maintain adequate renal perfusion and functioning will improve Outcome: Progressing   Problem: Education: Goal: Required Educational Video(s) Outcome: Progressing   Problem: Clinical Measurements: Goal: Ability to maintain clinical measurements within normal limits will improve Outcome: Progressing Goal: Postoperative complications will be avoided or minimized Outcome: Progressing   Problem: Skin Integrity: Goal: Demonstration of wound healing without infection will improve Outcome: Progressing

## 2019-11-27 NOTE — Plan of Care (Signed)
  Problem: Education: Goal: Knowledge of General Education information will improve Description: Including pain rating scale, medication(s)/side effects and non-pharmacologic comfort measures Outcome: Progressing   Problem: Health Behavior/Discharge Planning: Goal: Ability to manage health-related needs will improve Outcome: Progressing   Problem: Clinical Measurements: Goal: Ability to maintain clinical measurements within normal limits will improve Outcome: Progressing Goal: Will remain free from infection Outcome: Progressing Goal: Diagnostic test results will improve Outcome: Progressing Goal: Respiratory complications will improve Outcome: Progressing Goal: Cardiovascular complication will be avoided Outcome: Progressing   Problem: Activity: Goal: Risk for activity intolerance will decrease Outcome: Progressing   Problem: Nutrition: Goal: Adequate nutrition will be maintained Outcome: Progressing   Problem: Coping: Goal: Level of anxiety will decrease Outcome: Progressing   Problem: Elimination: Goal: Will not experience complications related to bowel motility Outcome: Progressing Goal: Will not experience complications related to urinary retention Outcome: Progressing   Problem: Pain Managment: Goal: General experience of comfort will improve Outcome: Progressing   Problem: Safety: Goal: Ability to remain free from injury will improve Outcome: Progressing   Problem: Skin Integrity: Goal: Risk for impaired skin integrity will decrease Outcome: Progressing   Problem: Education: Goal: Will demonstrate proper wound care and an understanding of methods to prevent future damage Outcome: Progressing Goal: Knowledge of disease or condition will improve Outcome: Progressing Goal: Knowledge of the prescribed therapeutic regimen will improve Outcome: Progressing Goal: Individualized Educational Video(s) Outcome: Progressing   Problem: Activity: Goal: Risk for  activity intolerance will decrease Outcome: Progressing   Problem: Cardiac: Goal: Will achieve and/or maintain hemodynamic stability Outcome: Progressing   Problem: Clinical Measurements: Goal: Postoperative complications will be avoided or minimized Outcome: Progressing   Problem: Respiratory: Goal: Respiratory status will improve Outcome: Progressing   Problem: Skin Integrity: Goal: Wound healing without signs and symptoms of infection Outcome: Progressing Goal: Risk for impaired skin integrity will decrease Outcome: Progressing   Problem: Urinary Elimination: Goal: Ability to achieve and maintain adequate renal perfusion and functioning will improve Outcome: Progressing   Problem: Education: Goal: Required Educational Video(s) Outcome: Progressing   Problem: Clinical Measurements: Goal: Ability to maintain clinical measurements within normal limits will improve Outcome: Progressing Goal: Postoperative complications will be avoided or minimized Outcome: Progressing   Problem: Skin Integrity: Goal: Demonstration of wound healing without infection will improve Outcome: Progressing

## 2019-11-28 ENCOUNTER — Inpatient Hospital Stay (HOSPITAL_COMMUNITY): Payer: PPO | Admitting: Certified Registered Nurse Anesthetist

## 2019-11-28 ENCOUNTER — Inpatient Hospital Stay (HOSPITAL_COMMUNITY): Payer: PPO

## 2019-11-28 ENCOUNTER — Inpatient Hospital Stay (HOSPITAL_COMMUNITY)
Admission: RE | Disposition: A | Discharge status: Home / Self Care | Payer: Self-pay | Source: Home / Self Care | Attending: Cardiothoracic Surgery

## 2019-11-28 ENCOUNTER — Encounter (HOSPITAL_COMMUNITY): Payer: Self-pay | Admitting: Cardiovascular Disease

## 2019-11-28 DIAGNOSIS — I25110 Atherosclerotic heart disease of native coronary artery with unstable angina pectoris: Secondary | ICD-10-CM

## 2019-11-28 DIAGNOSIS — Z951 Presence of aortocoronary bypass graft: Secondary | ICD-10-CM

## 2019-11-28 HISTORY — PX: CORONARY ARTERY BYPASS GRAFT: SHX141

## 2019-11-28 HISTORY — PX: TEE WITHOUT CARDIOVERSION: SHX5443

## 2019-11-28 LAB — POCT I-STAT 7, (LYTES, BLD GAS, ICA,H+H)
Acid-Base Excess: 1 mmol/L (ref 0.0–2.0)
Acid-Base Excess: 5 mmol/L — ABNORMAL HIGH (ref 0.0–2.0)
Acid-Base Excess: 4 mmol/L — ABNORMAL HIGH (ref 0.0–2.0)
Acid-Base Excess: 3 mmol/L — ABNORMAL HIGH (ref 0.0–2.0)
Acid-base deficit: 1 mmol/L (ref 0.0–2.0)
Bicarbonate: 26.1 mmol/L (ref 20.0–28.0)
Bicarbonate: 29.4 mmol/L — ABNORMAL HIGH (ref 20.0–28.0)
Bicarbonate: 27.9 mmol/L (ref 20.0–28.0)
Bicarbonate: 23.5 mmol/L (ref 20.0–28.0)
Bicarbonate: 26.9 mmol/L (ref 20.0–28.0)
Calcium, Ion: 1.26 mmol/L (ref 1.15–1.40)
Calcium, Ion: 0.99 mmol/L — ABNORMAL LOW (ref 1.15–1.40)
Calcium, Ion: 0.93 mmol/L — ABNORMAL LOW (ref 1.15–1.40)
Calcium, Ion: 0.96 mmol/L — ABNORMAL LOW (ref 1.15–1.40)
Calcium, Ion: 1.19 mmol/L (ref 1.15–1.40)
HCT: 30 % — ABNORMAL LOW (ref 39.0–52.0)
HCT: 21 % — ABNORMAL LOW (ref 39.0–52.0)
HCT: 21 % — ABNORMAL LOW (ref 39.0–52.0)
HCT: 25 % — ABNORMAL LOW (ref 39.0–52.0)
HCT: 30 % — ABNORMAL LOW (ref 39.0–52.0)
Hemoglobin: 10.2 g/dL — ABNORMAL LOW (ref 13.0–17.0)
Hemoglobin: 7.1 g/dL — ABNORMAL LOW (ref 13.0–17.0)
Hemoglobin: 7.1 g/dL — ABNORMAL LOW (ref 13.0–17.0)
Hemoglobin: 10.2 g/dL — ABNORMAL LOW (ref 13.0–17.0)
Hemoglobin: 8.5 g/dL — ABNORMAL LOW (ref 13.0–17.0)
O2 Saturation: 99 %
O2 Saturation: 100 %
O2 Saturation: 100 %
O2 Saturation: 100 %
O2 Saturation: 98 %
Potassium: 3.2 mmol/L — ABNORMAL LOW (ref 3.5–5.1)
Potassium: 3.2 mmol/L — ABNORMAL LOW (ref 3.5–5.1)
Potassium: 3.7 mmol/L (ref 3.5–5.1)
Potassium: 3 mmol/L — ABNORMAL LOW (ref 3.5–5.1)
Potassium: 3.2 mmol/L — ABNORMAL LOW (ref 3.5–5.1)
Sodium: 141 mmol/L (ref 135–145)
Sodium: 141 mmol/L (ref 135–145)
Sodium: 139 mmol/L (ref 135–145)
Sodium: 141 mmol/L (ref 135–145)
Sodium: 141 mmol/L (ref 135–145)
TCO2: 27 mmol/L (ref 22–32)
TCO2: 31 mmol/L (ref 22–32)
TCO2: 29 mmol/L (ref 22–32)
TCO2: 25 mmol/L (ref 22–32)
TCO2: 28 mmol/L (ref 22–32)
pCO2 arterial: 40.6 mmHg (ref 32.0–48.0)
pCO2 arterial: 38.9 mmHg (ref 32.0–48.0)
pCO2 arterial: 35.7 mmHg (ref 32.0–48.0)
pCO2 arterial: 36.1 mmHg (ref 32.0–48.0)
pCO2 arterial: 38.8 mmHg (ref 32.0–48.0)
pH, Arterial: 7.416 (ref 7.350–7.450)
pH, Arterial: 7.485 — ABNORMAL HIGH (ref 7.350–7.450)
pH, Arterial: 7.501 — ABNORMAL HIGH (ref 7.350–7.450)
pH, Arterial: 7.391 (ref 7.350–7.450)
pH, Arterial: 7.481 — ABNORMAL HIGH (ref 7.350–7.450)
pO2, Arterial: 138 mmHg — ABNORMAL HIGH (ref 83.0–108.0)
pO2, Arterial: 413 mmHg — ABNORMAL HIGH (ref 83.0–108.0)
pO2, Arterial: 402 mmHg — ABNORMAL HIGH (ref 83.0–108.0)
pO2, Arterial: 109 mmHg — ABNORMAL HIGH (ref 83.0–108.0)
pO2, Arterial: 381 mmHg — ABNORMAL HIGH (ref 83.0–108.0)

## 2019-11-28 LAB — BASIC METABOLIC PANEL
Anion gap: 10 (ref 5–15)
Anion gap: 7 (ref 5–15)
BUN: 21 mg/dL (ref 8–23)
BUN: 15 mg/dL (ref 8–23)
CO2: 26 mmol/L (ref 22–32)
CO2: 23 mmol/L (ref 22–32)
Calcium: 9.1 mg/dL (ref 8.9–10.3)
Calcium: 7.1 mg/dL — ABNORMAL LOW (ref 8.9–10.3)
Chloride: 105 mmol/L (ref 98–111)
Chloride: 106 mmol/L (ref 98–111)
Creatinine, Ser: 1.11 mg/dL (ref 0.61–1.24)
Creatinine, Ser: 1.13 mg/dL (ref 0.61–1.24)
GFR calc Af Amer: >60 mL/min (ref >60)
GFR calc Af Amer: >60 mL/min (ref >60)
GFR calc non Af Amer: >60 mL/min (ref >60)
GFR calc non Af Amer: >60 mL/min (ref >60)
Glucose, Bld: 110 mg/dL — ABNORMAL HIGH (ref 70–99)
Glucose, Bld: 123 mg/dL — ABNORMAL HIGH (ref 70–99)
Potassium: 3.2 mmol/L — ABNORMAL LOW (ref 3.5–5.1)
Potassium: 3.6 mmol/L (ref 3.5–5.1)
Sodium: 141 mmol/L (ref 135–145)
Sodium: 136 mmol/L (ref 135–145)

## 2019-11-28 LAB — POCT I-STAT, CHEM 8
BUN: 17 mg/dL (ref 8–23)
BUN: 16 mg/dL (ref 8–23)
BUN: 15 mg/dL (ref 8–23)
BUN: 16 mg/dL (ref 8–23)
BUN: 16 mg/dL (ref 8–23)
BUN: 17 mg/dL (ref 8–23)
BUN: 16 mg/dL (ref 8–23)
Calcium, Ion: 1.16 mmol/L (ref 1.15–1.40)
Calcium, Ion: 0.97 mmol/L — ABNORMAL LOW (ref 1.15–1.40)
Calcium, Ion: 0.96 mmol/L — ABNORMAL LOW (ref 1.15–1.40)
Calcium, Ion: 0.98 mmol/L — ABNORMAL LOW (ref 1.15–1.40)
Calcium, Ion: 0.96 mmol/L — ABNORMAL LOW (ref 1.15–1.40)
Calcium, Ion: 0.99 mmol/L — ABNORMAL LOW (ref 1.15–1.40)
Calcium, Ion: 1.17 mmol/L (ref 1.15–1.40)
Chloride: 103 mmol/L (ref 98–111)
Chloride: 101 mmol/L (ref 98–111)
Chloride: 101 mmol/L (ref 98–111)
Chloride: 101 mmol/L (ref 98–111)
Chloride: 101 mmol/L (ref 98–111)
Chloride: 102 mmol/L (ref 98–111)
Chloride: 103 mmol/L (ref 98–111)
Creatinine, Ser: 0.9 mg/dL (ref 0.61–1.24)
Creatinine, Ser: 0.7 mg/dL (ref 0.61–1.24)
Creatinine, Ser: 0.7 mg/dL (ref 0.61–1.24)
Creatinine, Ser: 0.7 mg/dL (ref 0.61–1.24)
Creatinine, Ser: 0.7 mg/dL (ref 0.61–1.24)
Creatinine, Ser: 0.7 mg/dL (ref 0.61–1.24)
Creatinine, Ser: 0.8 mg/dL (ref 0.61–1.24)
Glucose, Bld: 102 mg/dL — ABNORMAL HIGH (ref 70–99)
Glucose, Bld: 133 mg/dL — ABNORMAL HIGH (ref 70–99)
Glucose, Bld: 132 mg/dL — ABNORMAL HIGH (ref 70–99)
Glucose, Bld: 136 mg/dL — ABNORMAL HIGH (ref 70–99)
Glucose, Bld: 136 mg/dL — ABNORMAL HIGH (ref 70–99)
Glucose, Bld: 149 mg/dL — ABNORMAL HIGH (ref 70–99)
Glucose, Bld: 153 mg/dL — ABNORMAL HIGH (ref 70–99)
HCT: 26 % — ABNORMAL LOW (ref 39.0–52.0)
HCT: 24 % — ABNORMAL LOW (ref 39.0–52.0)
HCT: 24 % — ABNORMAL LOW (ref 39.0–52.0)
HCT: 24 % — ABNORMAL LOW (ref 39.0–52.0)
HCT: 23 % — ABNORMAL LOW (ref 39.0–52.0)
HCT: 24 % — ABNORMAL LOW (ref 39.0–52.0)
HCT: 27 % — ABNORMAL LOW (ref 39.0–52.0)
Hemoglobin: 8.8 g/dL — ABNORMAL LOW (ref 13.0–17.0)
Hemoglobin: 8.2 g/dL — ABNORMAL LOW (ref 13.0–17.0)
Hemoglobin: 8.2 g/dL — ABNORMAL LOW (ref 13.0–17.0)
Hemoglobin: 8.2 g/dL — ABNORMAL LOW (ref 13.0–17.0)
Hemoglobin: 7.8 g/dL — ABNORMAL LOW (ref 13.0–17.0)
Hemoglobin: 8.2 g/dL — ABNORMAL LOW (ref 13.0–17.0)
Hemoglobin: 9.2 g/dL — ABNORMAL LOW (ref 13.0–17.0)
Potassium: 3.2 mmol/L — ABNORMAL LOW (ref 3.5–5.1)
Potassium: 3.8 mmol/L (ref 3.5–5.1)
Potassium: 3.7 mmol/L (ref 3.5–5.1)
Potassium: 3.7 mmol/L (ref 3.5–5.1)
Potassium: 3.2 mmol/L — ABNORMAL LOW (ref 3.5–5.1)
Potassium: 3.4 mmol/L — ABNORMAL LOW (ref 3.5–5.1)
Potassium: 3 mmol/L — ABNORMAL LOW (ref 3.5–5.1)
Sodium: 140 mmol/L (ref 135–145)
Sodium: 140 mmol/L (ref 135–145)
Sodium: 138 mmol/L (ref 135–145)
Sodium: 139 mmol/L (ref 135–145)
Sodium: 139 mmol/L (ref 135–145)
Sodium: 139 mmol/L (ref 135–145)
Sodium: 141 mmol/L (ref 135–145)
TCO2: 26 mmol/L (ref 22–32)
TCO2: 29 mmol/L (ref 22–32)
TCO2: 29 mmol/L (ref 22–32)
TCO2: 29 mmol/L (ref 22–32)
TCO2: 29 mmol/L (ref 22–32)
TCO2: 30 mmol/L (ref 22–32)
TCO2: 25 mmol/L (ref 22–32)

## 2019-11-28 LAB — CBC
HCT: 33.3 % — ABNORMAL LOW (ref 39.0–52.0)
HCT: 32.1 % — ABNORMAL LOW (ref 39.0–52.0)
HCT: 30 % — ABNORMAL LOW (ref 39.0–52.0)
Hemoglobin: 9.9 g/dL — ABNORMAL LOW (ref 13.0–17.0)
Hemoglobin: 10.3 g/dL — ABNORMAL LOW (ref 13.0–17.0)
Hemoglobin: 9.5 g/dL — ABNORMAL LOW (ref 13.0–17.0)
MCH: 23.1 pg — ABNORMAL LOW (ref 26.0–34.0)
MCH: 25.7 pg — ABNORMAL LOW (ref 26.0–34.0)
MCH: 25.2 pg — ABNORMAL LOW (ref 26.0–34.0)
MCHC: 29.7 g/dL — ABNORMAL LOW (ref 30.0–36.0)
MCHC: 32.1 g/dL (ref 30.0–36.0)
MCHC: 31.7 g/dL (ref 30.0–36.0)
MCV: 77.6 fL — ABNORMAL LOW (ref 80.0–100.0)
MCV: 80 fL (ref 80.0–100.0)
MCV: 79.6 fL — ABNORMAL LOW (ref 80.0–100.0)
Platelets: 333 10*3/uL (ref 150–400)
Platelets: 190 10*3/uL (ref 150–400)
Platelets: 198 10*3/uL (ref 150–400)
RBC: 4.29 MIL/uL (ref 4.22–5.81)
RBC: 4.01 MIL/uL — ABNORMAL LOW (ref 4.22–5.81)
RBC: 3.77 MIL/uL — ABNORMAL LOW (ref 4.22–5.81)
RDW: 21.3 % — ABNORMAL HIGH (ref 11.5–15.5)
RDW: 21.3 % — ABNORMAL HIGH (ref 11.5–15.5)
RDW: 21.2 % — ABNORMAL HIGH (ref 11.5–15.5)
WBC: 6.1 10*3/uL (ref 4.0–10.5)
WBC: 10.4 10*3/uL (ref 4.0–10.5)
WBC: 10.3 10*3/uL (ref 4.0–10.5)
nRBC: 0 % (ref 0.0–0.2)
nRBC: 0 % (ref 0.0–0.2)
nRBC: 0 % (ref 0.0–0.2)

## 2019-11-28 LAB — GLUCOSE, CAPILLARY
Glucose-Capillary: 110 mg/dL — ABNORMAL HIGH (ref 70–99)
Glucose-Capillary: 116 mg/dL — ABNORMAL HIGH (ref 70–99)
Glucose-Capillary: 106 mg/dL — ABNORMAL HIGH (ref 70–99)
Glucose-Capillary: 111 mg/dL — ABNORMAL HIGH (ref 70–99)
Glucose-Capillary: 111 mg/dL — ABNORMAL HIGH (ref 70–99)
Glucose-Capillary: 132 mg/dL — ABNORMAL HIGH (ref 70–99)
Glucose-Capillary: 130 mg/dL — ABNORMAL HIGH (ref 70–99)
Glucose-Capillary: 117 mg/dL — ABNORMAL HIGH (ref 70–99)

## 2019-11-28 LAB — COOXEMETRY PANEL
Carboxyhemoglobin: 1.8 % — ABNORMAL HIGH (ref 0.5–1.5)
Methemoglobin: 1.4 % (ref 0.0–1.5)
O2 Saturation: 64 %
Total hemoglobin: 10.4 g/dL — ABNORMAL LOW (ref 12.0–16.0)

## 2019-11-28 LAB — HEMOGLOBIN AND HEMATOCRIT, BLOOD
HCT: 23.1 % — ABNORMAL LOW (ref 39.0–52.0)
Hemoglobin: 7.3 g/dL — ABNORMAL LOW (ref 13.0–17.0)

## 2019-11-28 LAB — ECHO INTRAOPERATIVE TEE
Height: 67 in
Weight: 2395.08 oz

## 2019-11-28 LAB — PREPARE RBC (CROSSMATCH)

## 2019-11-28 LAB — PLATELET COUNT: Platelets: 191 10*3/uL (ref 150–400)

## 2019-11-28 LAB — APTT: aPTT: 34 seconds (ref 24–36)

## 2019-11-28 LAB — MAGNESIUM: Magnesium: 2.5 mg/dL — ABNORMAL HIGH (ref 1.7–2.4)

## 2019-11-28 LAB — PROTIME-INR
INR: 1.4 — ABNORMAL HIGH (ref 0.8–1.2)
Prothrombin Time: 16.9 seconds — ABNORMAL HIGH (ref 11.4–15.2)

## 2019-11-28 LAB — TSH: TSH: 4.384 u[IU]/mL (ref 0.350–4.500)

## 2019-11-28 SURGERY — CORONARY ARTERY BYPASS GRAFTING (CABG)
Anesthesia: General | Site: Chest

## 2019-11-28 MED ORDER — NOREPINEPHRINE 4 MG/250ML-% IV SOLN
0.0000 ug/min | INTRAVENOUS | Status: DC
  Administered 2019-11-28: 3 ug/min via INTRAVENOUS

## 2019-11-28 MED ORDER — ROCURONIUM BROMIDE 10 MG/ML (PF) SYRINGE
PREFILLED_SYRINGE | INTRAVENOUS | Status: AC
  Filled 2019-11-28: qty 20

## 2019-11-28 MED ORDER — ALBUMIN HUMAN 5 % IV SOLN
INTRAVENOUS | Status: DC | PRN
Start: 2019-11-28 — End: 2019-11-28

## 2019-11-28 MED ORDER — MIDAZOLAM HCL 5 MG/5ML IJ SOLN
INTRAMUSCULAR | Status: DC | PRN
  Administered 2019-11-28 (×2): 2 mg via INTRAVENOUS
  Administered 2019-11-28: 3 mg via INTRAVENOUS

## 2019-11-28 MED ORDER — SODIUM CHLORIDE 0.9% FLUSH
3.0000 mL | INTRAVENOUS | Status: DC | PRN
  Administered 2019-11-28: 3 mL via INTRAVENOUS

## 2019-11-28 MED ORDER — ASPIRIN 81 MG PO CHEW
324.0000 mg | CHEWABLE_TABLET | Freq: Every day | ORAL | Status: DC
  Filled 2019-11-28: qty 4

## 2019-11-28 MED ORDER — SODIUM CHLORIDE 0.9% FLUSH: 10.0000 mL | INTRAVENOUS | Status: DC | PRN

## 2019-11-28 MED ORDER — MIDAZOLAM HCL 2 MG/2ML IJ SOLN
INTRAMUSCULAR | Status: AC
  Administered 2019-11-28: 2 mg via INTRAVENOUS
  Filled 2019-11-28: qty 2

## 2019-11-28 MED ORDER — ACETAMINOPHEN 500 MG PO TABS
1000.0000 mg | ORAL_TABLET | Freq: Four times a day (QID) | ORAL | Status: AC
  Administered 2019-11-29 – 2019-12-01 (×10): 1000 mg via ORAL
  Filled 2019-11-28 (×10): qty 2

## 2019-11-28 MED ORDER — LACTATED RINGERS IV SOLN: INTRAVENOUS | Status: DC

## 2019-11-28 MED ORDER — PHENYLEPHRINE HCL-NACL 20-0.9 MG/250ML-% IV SOLN: 0.0000 ug/min | INTRAVENOUS | Status: DC

## 2019-11-28 MED ORDER — SODIUM CHLORIDE 0.9 % IV SOLN
20.0000 ug | Freq: Once | INTRAVENOUS | Status: AC
  Administered 2019-11-28: 20 ug via INTRAVENOUS
  Filled 2019-11-28: qty 5

## 2019-11-28 MED ORDER — POTASSIUM CHLORIDE 10 MEQ/50ML IV SOLN
10.0000 meq | INTRAVENOUS | Status: AC
  Administered 2019-11-28 (×3): 10 meq via INTRAVENOUS

## 2019-11-28 MED ORDER — PLASMA-LYTE 148 IV SOLN
INTRAVENOUS | Status: DC | PRN
  Administered 2019-11-28: 500 mL via INTRAVASCULAR

## 2019-11-28 MED ORDER — POTASSIUM CHLORIDE 10 MEQ/50ML IV SOLN
10.0000 meq | INTRAVENOUS | Status: AC
  Administered 2019-11-28 (×2): 10 meq via INTRAVENOUS
  Filled 2019-11-28 (×2): qty 50

## 2019-11-28 MED ORDER — NITROGLYCERIN IN D5W 200-5 MCG/ML-% IV SOLN: 0.0000 ug/min | INTRAVENOUS | Status: DC

## 2019-11-28 MED ORDER — PROPOFOL 10 MG/ML IV BOLUS
INTRAVENOUS | Status: AC
  Filled 2019-11-28: qty 20

## 2019-11-28 MED ORDER — VANCOMYCIN HCL IN DEXTROSE 1-5 GM/200ML-% IV SOLN
1000.0000 mg | Freq: Once | INTRAVENOUS | Status: AC
  Administered 2019-11-28: 1000 mg via INTRAVENOUS
  Filled 2019-11-28: qty 200

## 2019-11-28 MED ORDER — ACETAMINOPHEN 160 MG/5ML PO SOLN: 650.0000 mg | Freq: Once | ORAL | Status: AC

## 2019-11-28 MED ORDER — FAMOTIDINE IN NACL 20-0.9 MG/50ML-% IV SOLN
20.0000 mg | Freq: Two times a day (BID) | INTRAVENOUS | Status: AC
  Administered 2019-11-28 (×2): 20 mg via INTRAVENOUS
  Filled 2019-11-28 (×2): qty 50

## 2019-11-28 MED ORDER — CHLORHEXIDINE GLUCONATE CLOTH 2 % EX PADS
6.0000 | MEDICATED_PAD | Freq: Every day | CUTANEOUS | Status: DC
  Administered 2019-11-28 – 2019-12-07 (×10): 6 via TOPICAL

## 2019-11-28 MED ORDER — SODIUM CHLORIDE 0.9 % IV SOLN: 250.0000 mL | INTRAVENOUS | Status: DC

## 2019-11-28 MED ORDER — ALBUMIN HUMAN 5 % IV SOLN
250.0000 mL | INTRAVENOUS | Status: DC | PRN
  Administered 2019-11-28: 12.5 g via INTRAVENOUS
  Filled 2019-11-28: qty 250

## 2019-11-28 MED ORDER — FUROSEMIDE 10 MG/ML IJ SOLN
INTRAMUSCULAR | Status: DC | PRN
Start: 2019-11-28 — End: 2019-11-28
  Administered 2019-11-28: 10 mg via INTRAMUSCULAR

## 2019-11-28 MED ORDER — SODIUM CHLORIDE 0.9 % IV SOLN
1.5000 g | Freq: Two times a day (BID) | INTRAVENOUS | Status: AC
  Administered 2019-11-28 – 2019-11-30 (×4): 1.5 g via INTRAVENOUS
  Filled 2019-11-28 (×4): qty 1.5

## 2019-11-28 MED ORDER — MILRINONE LACTATE IN DEXTROSE 20-5 MG/100ML-% IV SOLN
0.0000 ug/kg/min | INTRAVENOUS | Status: DC
  Administered 2019-11-28: 0.3 ug/kg/min via INTRAVENOUS
  Administered 2019-11-29: 0.25 ug/kg/min via INTRAVENOUS
  Administered 2019-11-29: 0.3 ug/kg/min via INTRAVENOUS
  Administered 2019-11-30: 0.25 ug/kg/min via INTRAVENOUS
  Filled 2019-11-28 (×3): qty 100

## 2019-11-28 MED ORDER — ACETAMINOPHEN 160 MG/5ML PO SOLN: 1000.0000 mg | Freq: Four times a day (QID) | ORAL | Status: DC

## 2019-11-28 MED ORDER — LACTATED RINGERS IV SOLN: INTRAVENOUS | Status: DC | PRN

## 2019-11-28 MED ORDER — MAGNESIUM SULFATE 4 GM/100ML IV SOLN
4.0000 g | Freq: Once | INTRAVENOUS | Status: AC
  Administered 2019-11-28: 4 g via INTRAVENOUS
  Filled 2019-11-28: qty 100

## 2019-11-28 MED ORDER — SODIUM CHLORIDE 0.9 % IV SOLN: INTRAVENOUS | Status: DC | PRN

## 2019-11-28 MED ORDER — OXYCODONE HCL 5 MG PO TABS
5.0000 mg | ORAL_TABLET | ORAL | Status: DC | PRN
  Administered 2019-11-29 – 2019-11-30 (×4): 10 mg via ORAL
  Filled 2019-11-28 (×4): qty 2

## 2019-11-28 MED ORDER — DOCUSATE SODIUM 100 MG PO CAPS
200.0000 mg | ORAL_CAPSULE | Freq: Every day | ORAL | Status: DC
  Administered 2019-11-30 – 2019-12-08 (×8): 200 mg via ORAL
  Filled 2019-11-28 (×8): qty 2

## 2019-11-28 MED ORDER — EPINEPHRINE HCL 5 MG/250ML IV SOLN IN NS
0.5000 ug/min | INTRAVENOUS | Status: DC
  Administered 2019-11-28: 2 ug/min via INTRAVENOUS

## 2019-11-28 MED ORDER — BISACODYL 5 MG PO TBEC
10.0000 mg | DELAYED_RELEASE_TABLET | Freq: Every day | ORAL | Status: DC
  Administered 2019-11-30 – 2019-12-07 (×6): 10 mg via ORAL
  Filled 2019-11-28 (×9): qty 2

## 2019-11-28 MED ORDER — ASPIRIN EC 325 MG PO TBEC
325.0000 mg | DELAYED_RELEASE_TABLET | Freq: Every day | ORAL | Status: DC
  Administered 2019-11-29 – 2019-12-04 (×6): 325 mg via ORAL
  Filled 2019-11-28 (×6): qty 1

## 2019-11-28 MED ORDER — MIDAZOLAM HCL 2 MG/2ML IJ SOLN
2.0000 mg | INTRAMUSCULAR | Status: DC | PRN
  Administered 2019-11-28: 2 mg via INTRAVENOUS
  Filled 2019-11-28 (×2): qty 2

## 2019-11-28 MED ORDER — ROCURONIUM BROMIDE 10 MG/ML (PF) SYRINGE
PREFILLED_SYRINGE | INTRAVENOUS | Status: DC | PRN
  Administered 2019-11-28 (×2): 50 mg via INTRAVENOUS
  Administered 2019-11-28: 60 mg via INTRAVENOUS
  Administered 2019-11-28: 100 mg via INTRAVENOUS
  Administered 2019-11-28: 40 mg via INTRAVENOUS

## 2019-11-28 MED ORDER — BISACODYL 10 MG RE SUPP
10.0000 mg | Freq: Every day | RECTAL | Status: DC
  Filled 2019-11-28: qty 1

## 2019-11-28 MED ORDER — FENTANYL CITRATE (PF) 100 MCG/2ML IJ SOLN
INTRAMUSCULAR | Status: AC
  Filled 2019-11-28: qty 2

## 2019-11-28 MED ORDER — ACETAMINOPHEN 650 MG RE SUPP
650.0000 mg | Freq: Once | RECTAL | Status: AC
  Administered 2019-11-28: 650 mg via RECTAL

## 2019-11-28 MED ORDER — PROTAMINE SULFATE 10 MG/ML IV SOLN
INTRAVENOUS | Status: DC | PRN
  Administered 2019-11-28: 250 mg via INTRAVENOUS

## 2019-11-28 MED ORDER — INSULIN REGULAR(HUMAN) IN NACL 100-0.9 UT/100ML-% IV SOLN
INTRAVENOUS | Status: DC
  Administered 2019-11-28: 0.7 [IU]/h via INTRAVENOUS

## 2019-11-28 MED ORDER — SODIUM CHLORIDE 0.45 % IV SOLN
INTRAVENOUS | Status: DC | PRN
  Administered 2019-11-28: 1 mL via INTRAVENOUS

## 2019-11-28 MED ORDER — DEXMEDETOMIDINE HCL IN NACL 400 MCG/100ML IV SOLN
0.0000 ug/kg/h | INTRAVENOUS | Status: DC
  Administered 2019-11-28: 0.7 ug/kg/h via INTRAVENOUS
  Filled 2019-11-28: qty 100

## 2019-11-28 MED ORDER — LACTATED RINGERS IV SOLN: 500.0000 mL | Freq: Once | INTRAVENOUS | Status: DC | PRN

## 2019-11-28 MED ORDER — ONDANSETRON HCL 4 MG/2ML IJ SOLN
4.0000 mg | Freq: Four times a day (QID) | INTRAMUSCULAR | Status: DC | PRN
  Administered 2019-11-30: 4 mg via INTRAVENOUS
  Filled 2019-11-28: qty 2

## 2019-11-28 MED ORDER — CHLORHEXIDINE GLUCONATE 0.12 % MT SOLN
15.0000 mL | OROMUCOSAL | Status: AC
  Administered 2019-11-28: 15 mL via OROMUCOSAL
  Filled 2019-11-28: qty 15

## 2019-11-28 MED ORDER — METOPROLOL TARTRATE 5 MG/5ML IV SOLN: 2.5000 mg | INTRAVENOUS | Status: DC | PRN

## 2019-11-28 MED ORDER — METOPROLOL TARTRATE 12.5 MG HALF TABLET
12.5000 mg | ORAL_TABLET | Freq: Two times a day (BID) | ORAL | Status: DC
  Administered 2019-11-29 – 2019-12-02 (×7): 12.5 mg via ORAL
  Filled 2019-11-28 (×7): qty 1

## 2019-11-28 MED ORDER — FENTANYL CITRATE (PF) 100 MCG/2ML IJ SOLN
50.0000 ug | INTRAMUSCULAR | Status: DC | PRN
  Administered 2019-11-28 – 2019-11-29 (×3): 50 ug via INTRAVENOUS
  Administered 2019-11-29: 100 ug via INTRAVENOUS
  Administered 2019-11-29 (×2): 50 ug via INTRAVENOUS
  Administered 2019-11-30: 100 ug via INTRAVENOUS
  Filled 2019-11-28 (×7): qty 2

## 2019-11-28 MED ORDER — IRBESARTAN 150 MG PO TABS: 150.0000 mg | ORAL_TABLET | Freq: Every day | ORAL | Status: DC

## 2019-11-28 MED ORDER — METOPROLOL TARTRATE 25 MG/10 ML ORAL SUSPENSION: 12.5000 mg | Freq: Two times a day (BID) | ORAL | Status: DC

## 2019-11-28 MED ORDER — SODIUM CHLORIDE 0.9% FLUSH
10.0000 mL | Freq: Two times a day (BID) | INTRAVENOUS | Status: DC
  Administered 2019-11-29 – 2019-11-30 (×3): 10 mL via INTRAVENOUS
  Administered 2019-12-01: 20 mL via INTRAVENOUS
  Administered 2019-12-01 – 2019-12-02 (×3): 10 mL via INTRAVENOUS

## 2019-11-28 MED ORDER — PHENYLEPHRINE 40 MCG/ML (10ML) SYRINGE FOR IV PUSH (FOR BLOOD PRESSURE SUPPORT)
PREFILLED_SYRINGE | INTRAVENOUS | Status: DC | PRN
  Administered 2019-11-28: 40 ug via INTRAVENOUS
  Administered 2019-11-28: 20 ug via INTRAVENOUS

## 2019-11-28 MED ORDER — PROPOFOL 10 MG/ML IV BOLUS
INTRAVENOUS | Status: DC | PRN
  Administered 2019-11-28: 60 mg via INTRAVENOUS

## 2019-11-28 MED ORDER — FENTANYL CITRATE (PF) 250 MCG/5ML IJ SOLN
INTRAMUSCULAR | Status: DC | PRN
  Administered 2019-11-28: 50 ug via INTRAVENOUS
  Administered 2019-11-28: 150 ug via INTRAVENOUS
  Administered 2019-11-28 (×2): 50 ug via INTRAVENOUS
  Administered 2019-11-28: 250 ug via INTRAVENOUS
  Administered 2019-11-28: 50 ug via INTRAVENOUS
  Administered 2019-11-28: 250 ug via INTRAVENOUS
  Administered 2019-11-28: 100 ug via INTRAVENOUS
  Administered 2019-11-28: 250 ug via INTRAVENOUS
  Administered 2019-11-28: 50 ug via INTRAVENOUS
  Administered 2019-11-28: 100 ug via INTRAVENOUS

## 2019-11-28 MED ORDER — SODIUM CHLORIDE 0.9% FLUSH
3.0000 mL | Freq: Two times a day (BID) | INTRAVENOUS | Status: DC
  Administered 2019-11-29 – 2019-12-05 (×9): 3 mL via INTRAVENOUS

## 2019-11-28 MED ORDER — MIDAZOLAM HCL (PF) 10 MG/2ML IJ SOLN
INTRAMUSCULAR | Status: AC
  Filled 2019-11-28: qty 2

## 2019-11-28 MED ORDER — HEPARIN SODIUM (PORCINE) 1000 UNIT/ML IJ SOLN
INTRAMUSCULAR | Status: DC | PRN
  Administered 2019-11-28: 23000 [IU] via INTRAVENOUS
  Administered 2019-11-28: 2000 [IU] via INTRAVENOUS

## 2019-11-28 MED ORDER — FINASTERIDE 5 MG PO TABS
5.0000 mg | ORAL_TABLET | Freq: Every evening | ORAL | Status: DC
  Administered 2019-11-29 – 2019-12-07 (×9): 5 mg via ORAL
  Filled 2019-11-28 (×9): qty 1

## 2019-11-28 MED ORDER — FENTANYL CITRATE (PF) 250 MCG/5ML IJ SOLN
INTRAMUSCULAR | Status: AC
  Filled 2019-11-28: qty 25

## 2019-11-28 MED ORDER — HEPARIN SODIUM (PORCINE) 1000 UNIT/ML IJ SOLN
INTRAMUSCULAR | Status: AC
  Filled 2019-11-28: qty 2

## 2019-11-28 MED ORDER — SODIUM CHLORIDE 0.9 % IV SOLN: INTRAVENOUS | Status: DC

## 2019-11-28 MED ORDER — PROTAMINE SULFATE 10 MG/ML IV SOLN
INTRAVENOUS | Status: AC
  Filled 2019-11-28: qty 25

## 2019-11-28 MED ORDER — PANTOPRAZOLE SODIUM 40 MG PO TBEC
40.0000 mg | DELAYED_RELEASE_TABLET | Freq: Every day | ORAL | Status: DC
  Administered 2019-11-30 – 2019-12-08 (×9): 40 mg via ORAL
  Filled 2019-11-28 (×9): qty 1

## 2019-11-28 MED ORDER — HEMOSTATIC AGENTS (NO CHARGE) OPTIME
TOPICAL | Status: DC | PRN
  Administered 2019-11-28 (×5): 1 via TOPICAL

## 2019-11-28 MED ORDER — DEXTROSE 50 % IV SOLN: 0.0000 mL | INTRAVENOUS | Status: DC | PRN

## 2019-11-28 MED ORDER — 0.9 % SODIUM CHLORIDE (POUR BTL) OPTIME
TOPICAL | Status: DC | PRN
  Administered 2019-11-28: 5000 mL

## 2019-11-28 MED ORDER — ORAL CARE MOUTH RINSE
15.0000 mL | Freq: Two times a day (BID) | OROMUCOSAL | Status: DC
  Administered 2019-11-28 – 2019-12-07 (×18): 15 mL via OROMUCOSAL

## 2019-11-28 MED ORDER — TRAMADOL HCL 50 MG PO TABS
50.0000 mg | ORAL_TABLET | ORAL | Status: DC | PRN
  Administered 2019-11-30: 50 mg via ORAL
  Filled 2019-11-28 (×2): qty 1

## 2019-11-28 SURGICAL SUPPLY — 99 items
ADAPTER CARDIO PERF ANTE/RETRO (ADAPTER) ×4 IMPLANT
ADH SKN CLS APL DERMABOND .7 (GAUZE/BANDAGES/DRESSINGS) ×2
ADPR PRFSN 84XANTGRD RTRGD (ADAPTER) ×2
AGENT HMST KT MTR STRL THRMB (HEMOSTASIS) ×2
BAG DECANTER FOR FLEXI CONT (MISCELLANEOUS) ×4 IMPLANT
BANDAGE HEMOSTAT MRDH 4X4 STRL (MISCELLANEOUS) ×1 IMPLANT
BLADE CLIPPER SURG (BLADE) ×4 IMPLANT
BLADE NDL 3 SS STRL (BLADE) IMPLANT
BLADE NEEDLE 3 SS STRL (BLADE) ×3 IMPLANT
BLADE NEEDLE 3MM SS STRL (BLADE) ×1
BLADE STERNUM SYSTEM 6 (BLADE) ×4 IMPLANT
BLADE SURG 12 STRL SS (BLADE) ×4 IMPLANT
BNDG ELASTIC 4X5.8 VLCR STR LF (GAUZE/BANDAGES/DRESSINGS) ×4 IMPLANT
BNDG ELASTIC 6X5.8 VLCR STR LF (GAUZE/BANDAGES/DRESSINGS) ×4 IMPLANT
BNDG GAUZE ELAST 4 BULKY (GAUZE/BANDAGES/DRESSINGS) ×4 IMPLANT
BNDG HEMOSTAT MRDH 4X4 STRL (MISCELLANEOUS) ×4
CANISTER SUCT 3000ML PPV (MISCELLANEOUS) ×4 IMPLANT
CANNULA GUNDRY RCSP 15FR (MISCELLANEOUS) ×4 IMPLANT
CATH CPB KIT VANTRIGT (MISCELLANEOUS) ×4 IMPLANT
CATH ROBINSON RED A/P 18FR (CATHETERS) ×12 IMPLANT
CATH THORACIC 28FR RT ANG (CATHETERS) ×4 IMPLANT
CLIP FOGARTY SPRING 6M (CLIP) ×2 IMPLANT
COVER SURGICAL LIGHT HANDLE (MISCELLANEOUS) ×3 IMPLANT
DERMABOND ADVANCED (GAUZE/BANDAGES/DRESSINGS) ×2
DERMABOND ADVANCED .7 DNX12 (GAUZE/BANDAGES/DRESSINGS) IMPLANT
DRAIN CHANNEL 32F RND 10.7 FF (WOUND CARE) ×4 IMPLANT
DRAPE CARDIOVASCULAR INCISE (DRAPES) ×4
DRAPE SLUSH/WARMER DISC (DRAPES) ×4 IMPLANT
DRAPE SRG 135X102X78XABS (DRAPES) ×2 IMPLANT
DRSG AQUACEL AG ADV 3.5X14 (GAUZE/BANDAGES/DRESSINGS) ×4 IMPLANT
ELECT BLADE 4.0 EZ CLEAN MEGAD (MISCELLANEOUS) ×4
ELECT BLADE 6.5 EXT (BLADE) ×4 IMPLANT
ELECT CAUTERY BLADE 6.4 (BLADE) ×4 IMPLANT
ELECT REM PT RETURN 9FT ADLT (ELECTROSURGICAL) ×8
ELECTRODE BLDE 4.0 EZ CLN MEGD (MISCELLANEOUS) ×2 IMPLANT
ELECTRODE REM PT RTRN 9FT ADLT (ELECTROSURGICAL) ×4 IMPLANT
FELT TEFLON 1X6 (MISCELLANEOUS) ×10 IMPLANT
GAUZE SPONGE 4X4 12PLY STRL (GAUZE/BANDAGES/DRESSINGS) ×8 IMPLANT
GLOVE BIO SURGEON STRL SZ7.5 (GLOVE) ×12 IMPLANT
GOWN STRL REUS W/ TWL LRG LVL3 (GOWN DISPOSABLE) ×8 IMPLANT
GOWN STRL REUS W/TWL LRG LVL3 (GOWN DISPOSABLE) ×16
HEMOSTAT POWDER SURGIFOAM 1G (HEMOSTASIS) ×12 IMPLANT
HEMOSTAT SURGICEL 2X14 (HEMOSTASIS) ×4 IMPLANT
INSERT FOGARTY XLG (MISCELLANEOUS) IMPLANT
KIT BASIN OR (CUSTOM PROCEDURE TRAY) ×4 IMPLANT
KIT SUCTION CATH 14FR (SUCTIONS) ×4 IMPLANT
KIT TURNOVER KIT B (KITS) ×4 IMPLANT
KIT VASOVIEW HEMOPRO 2 VH 4000 (KITS) ×4 IMPLANT
LEAD PACING MYOCARDI (MISCELLANEOUS) ×4 IMPLANT
MARKER GRAFT CORONARY BYPASS (MISCELLANEOUS) ×12 IMPLANT
NS IRRIG 1000ML POUR BTL (IV SOLUTION) ×20 IMPLANT
PACK E OPEN HEART (SUTURE) ×4 IMPLANT
PACK OPEN HEART (CUSTOM PROCEDURE TRAY) ×4 IMPLANT
PAD ARMBOARD 7.5X6 YLW CONV (MISCELLANEOUS) ×8 IMPLANT
PAD ELECT DEFIB RADIOL ZOLL (MISCELLANEOUS) ×4 IMPLANT
PENCIL BUTTON HOLSTER BLD 10FT (ELECTRODE) ×4 IMPLANT
POSITIONER HEAD DONUT 9IN (MISCELLANEOUS) ×4 IMPLANT
PUNCH AORTIC ROTATE  4.5MM 8IN (MISCELLANEOUS) ×3 IMPLANT
PUNCH AORTIC ROTATE 4.0MM (MISCELLANEOUS) IMPLANT
PUNCH AORTIC ROTATE 4.5MM 8IN (MISCELLANEOUS) IMPLANT
PUNCH AORTIC ROTATE 5MM 8IN (MISCELLANEOUS) IMPLANT
SEALANT PATCH FIBRIN 2X4IN (MISCELLANEOUS) ×3 IMPLANT
SET CARDIOPLEGIA MPS 5001102 (MISCELLANEOUS) ×2 IMPLANT
SPONGE LAP 18X18 RF (DISPOSABLE) ×4 IMPLANT
SPONGE LAP 4X18 RFD (DISPOSABLE) ×2 IMPLANT
SUPPORT HEART JANKE-BARRON (MISCELLANEOUS) ×4 IMPLANT
SURGIFLO W/THROMBIN 8M KIT (HEMOSTASIS) ×4 IMPLANT
SUT BONE WAX W31G (SUTURE) ×4 IMPLANT
SUT MNCRL AB 4-0 PS2 18 (SUTURE) IMPLANT
SUT PROLENE 3 0 SH DA (SUTURE) IMPLANT
SUT PROLENE 3 0 SH1 36 (SUTURE) IMPLANT
SUT PROLENE 4 0 RB 1 (SUTURE) ×4
SUT PROLENE 4 0 SH DA (SUTURE) ×4 IMPLANT
SUT PROLENE 4-0 RB1 .5 CRCL 36 (SUTURE) ×2 IMPLANT
SUT PROLENE 5 0 C 1 36 (SUTURE) IMPLANT
SUT PROLENE 6 0 C 1 30 (SUTURE) IMPLANT
SUT PROLENE 6 0 CC (SUTURE) ×12 IMPLANT
SUT PROLENE 8 0 BV175 6 (SUTURE) ×3 IMPLANT
SUT PROLENE BLUE 7 0 (SUTURE) ×4 IMPLANT
SUT PROLENE POLY MONO (SUTURE) ×4 IMPLANT
SUT SILK  1 MH (SUTURE)
SUT SILK 1 MH (SUTURE) IMPLANT
SUT SILK 2 0 SH CR/8 (SUTURE) IMPLANT
SUT SILK 3 0 SH CR/8 (SUTURE) IMPLANT
SUT STEEL 6MS V (SUTURE) ×8 IMPLANT
SUT STEEL SZ 6 DBL 3X14 BALL (SUTURE) ×4 IMPLANT
SUT VIC AB 1 CTX 36 (SUTURE) ×8
SUT VIC AB 1 CTX36XBRD ANBCTR (SUTURE) ×4 IMPLANT
SUT VIC AB 2-0 CT1 27 (SUTURE)
SUT VIC AB 2-0 CT1 TAPERPNT 27 (SUTURE) IMPLANT
SUT VIC AB 2-0 CTX 27 (SUTURE) IMPLANT
SUT VIC AB 3-0 X1 27 (SUTURE) IMPLANT
SYSTEM SAHARA CHEST DRAIN ATS (WOUND CARE) ×4 IMPLANT
TOWEL GREEN STERILE (TOWEL DISPOSABLE) ×4 IMPLANT
TOWEL GREEN STERILE FF (TOWEL DISPOSABLE) ×4 IMPLANT
TRAY FOLEY SLVR 16FR TEMP STAT (SET/KITS/TRAYS/PACK) ×4 IMPLANT
TUBING LAP HI FLOW INSUFFLATIO (TUBING) ×4 IMPLANT
UNDERPAD 30X36 HEAVY ABSORB (UNDERPADS AND DIAPERS) ×4 IMPLANT
WATER STERILE IRR 1000ML POUR (IV SOLUTION) ×8 IMPLANT

## 2019-11-28 NOTE — Progress Notes (Signed)
Pt with minimal CT drainage and stable hemodynamics began weaning protocol. Precedex drip weaned to off, Pt able to follow simple commands, Extubation process explained to Pt and nods to understanding. RRT notified to begin vent weaning.

## 2019-11-28 NOTE — Progress Notes (Signed)
  Echocardiogram Echocardiogram Transesophageal has been performed.  James Braun 11/28/2019, 9:35 AM

## 2019-11-28 NOTE — Transfer of Care (Signed)
Immediate Anesthesia Transfer of Care Note  Patient: James Braun  Procedure(s) Performed: CORONARY ARTERY BYPASS GRAFTING (CABG) x3, using left internal mammary artery and Saphenous vein harvested endoscopically (N/A Chest) TRANSESOPHAGEAL ECHOCARDIOGRAM (TEE) (N/A )  Patient Location: ICU  Anesthesia Type:General  Level of Consciousness: sedated and Patient remains intubated per anesthesia plan  Airway & Oxygen Therapy: Patient remains intubated per anesthesia plan and Patient placed on Ventilator (see vital sign flow sheet for setting)  Post-op Assessment: Report given to RN and Post -op Vital signs reviewed and stable  Post vital signs: Reviewed and stable  Last Vitals:  Vitals Value Taken Time  BP 62/45 11/28/19 1558  Temp 36.8 C 11/28/19 1558  Pulse 89 11/28/19 1558  Resp 12 11/28/19 1558  SpO2 94 % 11/28/19 1558  Vitals shown include unvalidated device data.  Last Pain:  Vitals:   11/28/19 0400  TempSrc:   PainSc: 0-No pain         Complications: No apparent anesthesia complications

## 2019-11-28 NOTE — Progress Notes (Signed)
Pt awoke extremely agitated, thrashing head side to side and attempting to sit/get up. Re-assurence provide without relief,Pt continued previous behavior. Midazolam 2 mg IV PRN give and Precedex turned back on. Will attempt vent weaning at a future time.

## 2019-11-28 NOTE — Anesthesia Postprocedure Evaluation (Signed)
Anesthesia Post Note  Patient: James Braun  Procedure(s) Performed: CORONARY ARTERY BYPASS GRAFTING (CABG) x3, using left internal mammary artery and Saphenous vein harvested endoscopically (N/A Chest) TRANSESOPHAGEAL ECHOCARDIOGRAM (TEE) (N/A )     Patient location during evaluation: SICU Anesthesia Type: General Level of consciousness: sedated Pain management: pain level controlled Vital Signs Assessment: post-procedure vital signs reviewed and stable Respiratory status: patient remains intubated per anesthesia plan Cardiovascular status: stable Postop Assessment: no apparent nausea or vomiting Anesthetic complications: no    Last Vitals:  Vitals:   11/28/19 1600 11/28/19 1700  BP: (!) 62/45   Pulse: 88 94  Resp: 12 12  Temp: 36.8 C 36.8 C  SpO2: 94% 96%    Last Pain:  Vitals:   11/28/19 0400  TempSrc:   PainSc: 0-No pain                 Kaysha Parsell P [place]

## 2019-11-28 NOTE — Anesthesia Procedure Notes (Signed)
Arterial Line Insertion Performed by: Valda Favia, CRNA, CRNA  Patient location: Pre-op. Preanesthetic checklist: patient identified, IV checked, site marked, risks and benefits discussed, surgical consent, monitors and equipment checked, pre-op evaluation, timeout performed and anesthesia consent Lidocaine 1% used for infiltration and patient sedated Left, radial was placed Catheter size: 20 G Hand hygiene performed , maximum sterile barriers used  and Seldinger technique used Allen's test indicative of satisfactory collateral circulation Attempts: 2 Procedure performed without using ultrasound guided technique. Following insertion, dressing applied and Biopatch. Post procedure assessment: normal  Patient tolerated the procedure well with no immediate complications.

## 2019-11-28 NOTE — Anesthesia Procedure Notes (Signed)
Procedure Name: Intubation Date/Time: 11/28/2019 8:00 AM Performed by: Valda Favia, CRNA Pre-anesthesia Checklist: Patient identified, Emergency Drugs available, Suction available and Patient being monitored Patient Re-evaluated:Patient Re-evaluated prior to induction Oxygen Delivery Method: Circle System Utilized Preoxygenation: Pre-oxygenation with 100% oxygen Induction Type: IV induction Ventilation: Mask ventilation without difficulty and Oral airway inserted - appropriate to patient size Laryngoscope Size: Glidescope and 4 Grade View: Grade I Tube type: Oral Tube size: 8.0 mm Number of attempts: 1 Airway Equipment and Method: Stylet,  Oral airway and Video-laryngoscopy Placement Confirmation: ETT inserted through vocal cords under direct vision,  positive ETCO2 and breath sounds checked- equal and bilateral Secured at: 23 cm Tube secured with: Tape Dental Injury: Teeth and Oropharynx as per pre-operative assessment

## 2019-11-28 NOTE — Progress Notes (Signed)
Patient arrived to OR alert and oriented. Patient able to confirm name, DOB, procedure, allergies, no metal in body, npo status and no pain at this time. Wife in waiting area. Patient able to move over to OR table with minimal assistance.   Leatha Gilding, RN

## 2019-11-28 NOTE — Anesthesia Procedure Notes (Signed)
Central Venous Catheter Insertion Performed by: Effie Berkshire, MD, anesthesiologist Start/End5/14/2021 6:45 AM, 11/28/2019 6:50 AM Patient location: Pre-op. Preanesthetic checklist: patient identified, IV checked, site marked, risks and benefits discussed, surgical consent, monitors and equipment checked, pre-op evaluation, timeout performed and anesthesia consent Hand hygiene performed  and maximum sterile barriers used  PA cath was placed.Swan type:thermodilution Procedure performed without using ultrasound guided technique. Attempts: 1 Patient tolerated the procedure well with no immediate complications.

## 2019-11-28 NOTE — Progress Notes (Signed)
[facility] BRIEF SICU PROGRESS NOTE  Day of Surgery  S/P Procedure(s) (LRB): CORONARY ARTERY BYPASS GRAFTING (CABG) x3, using left internal mammary artery and Saphenous vein harvested endoscopically (N/A) TRANSESOPHAGEAL ECHOCARDIOGRAM (TEE) (N/A)   Sedated on vent NSR w/ stable hemodynamics on low dose Epi and milrinone O2 sats 94-96% Chest tube output low UOP excellent Labs okay  Plan: Continue routine early postop  James Alberts, MD 11/28/2019 7:08 PM

## 2019-11-28 NOTE — Discharge Instructions (Signed)

## 2019-11-28 NOTE — Progress Notes (Signed)
Pre Procedure note for inpatients:   James Braun has been scheduled for Procedure(s): LEFT HEART CATH AND CORONARY ANGIOGRAPHY (N/A) today. The various methods of treatment have been discussed with the patient. After consideration of the risks, benefits and treatment options the patient has consented to the planned procedure.   The patient has been seen and labs reviewed. There are no changes in the patient's condition to prevent proceeding with the planned procedure today.  Recent labs:  Lab Results  Component Value Date   WBC 6.0 11/21/2019   HGB 10.8 (L) 11/21/2019   HCT 35.0 (L) 11/21/2019   PLT 387 11/21/2019   GLUCOSE 145 (H) 11/21/2019   CHOL 172 09/22/2019   TRIG 164.0 (H) 09/22/2019   HDL 32.40 (L) 09/22/2019   LDLDIRECT 141.5 02/03/2013   LDLCALC 107 (H) 09/22/2019   ALT 20 11/27/2019   AST 21 11/27/2019   NA 139 11/21/2019   K 4.3 11/21/2019   CL 99 11/21/2019   CREATININE 1.31 (H) 11/21/2019   BUN 21 11/21/2019   CO2 26 11/21/2019   TSH 2.84 02/03/2013   PSA 4.11 (H) 09/22/2019   INR 1.1 11/27/2019   HGBA1C 7.2 (H) 11/27/2019   MICROALBUR 12.0 (H) 02/15/2015    Len Childs, MD 11/28/2019 4:06 AM

## 2019-11-28 NOTE — Anesthesia Procedure Notes (Signed)
Central Venous Catheter Insertion Performed by: Effie Berkshire, MD, anesthesiologist Start/End5/14/2021 6:35 AM, 11/28/2019 6:45 AM Patient location: Pre-op. Preanesthetic checklist: patient identified, IV checked, site marked, risks and benefits discussed, surgical consent, monitors and equipment checked, pre-op evaluation, timeout performed and anesthesia consent Position: Trendelenburg Lidocaine 1% used for infiltration and patient sedated Hand hygiene performed , maximum sterile barriers used  and Seldinger technique used Catheter size: 9 Fr Total catheter length 10. Central line was placed.MAC introducer Swan type:thermodilution PA Cath depth:50 Procedure performed using ultrasound guided technique. Ultrasound Notes:anatomy identified, needle tip was noted to be adjacent to the nerve/plexus identified, no ultrasound evidence of intravascular and/or intraneural injection and image(s) printed for medical record Attempts: 1 Following insertion, line sutured and dressing applied. Post procedure assessment: blood return through all ports, free fluid flow and no air  Patient tolerated the procedure well with no immediate complications.

## 2019-11-28 NOTE — Discharge Summary (Addendum)
Physician Discharge Summary       James Braun       James Braun [id number]             [phone]    Patient ID: James Braun MRN: [id number] DOB/AGE: 1941/01/24 79 y.o.  Admit date: 11/27/2019 Discharge date: 12/08/2019  Admission Diagnoses: Coronary artery disease  Discharge Diagnoses:  1. S/p CABG x 3 2. 3. History of hypertension 4. History of hyperlipidemia 5. History of Type 2 diabetes mellitus (James Braun) 6. History of left ureteral calculus 7. History of BPH (benign prostatic hypertrophy) 8. History of kidney stones 9. History of melanoma excision   Consults:  AHF  Procedure (s):   1.  Coronary artery bypass grafting x3 (left internal mammary artery to left anterior descending, saphenous vein graft to diagonal, saphenous vein graft to posterolateral branch of the right coronary). 2.  Endoscopic harvest of right leg greater saphenous vein.  History of Presenting Illness: Patient examined, images of coronary angiogram, echocardiogram and carotid Doppler images all personally reviewed and discussed with patient.   79 year old diabetic non-smoker with recent history of severe chest pain for 36 hours followed by persistent pain of lesser intensity. Today he underwent outpatient cardiac catheterization which showed occlusion of a dominant RCA, high-grade stenosis of the LAD-diagonal.  LVEDP 19 mmHg EF 35% with inferior wall hypokinesia.  Echocardiogram was performed which shows moderate LV dysfunction, no significant valve disease noted with final report pending.  The patient is a non-smoker and PFTs are satisfactory.   Patient has hypertension, diabetes with A1c 7.2 and dyslipidemia with intolerance of statin.   The patient is very functional and still works and lives with his wife and his very active.   Brief Hospital Course:   The patient was extubated the evening of surgery without difficulty. He/she remained afebrile and hemodynamically stable. Gordy Councilman, a line, chest tubes, and foley were removed early in the post operative course. Lopressor was started and titrated accordingly. He/she was volume over loaded and diuresed. He/she had ABL anemia. He/she did not require a post op transfusion. Last H and H was 9.5/30.4. He/she was weaned off the insulin drip.  Once he/she was tolerating a diet, home diabetic medicines were restarted.  The patient's glucose remained well controlled. The patient's HGA1C pre op was 7.2. The patient was felt surgically stable for transfer from the ICU to [facility] for further convalescence on 05/17.  The patient's co-ox level continued to drop.  He was on Milrinone at 0.25 mg.  Due to this AHF team was consulted.  They increased the patient's milrinone to .375 mg.  He developed brief episodes of PAF.  He was started on Amiodarone and Digoxin for this. Epicardial pacing wires were removed on 05/20.  He was started on Eliquis for stroke prophylaxis.  He was aggressively diuresed.  He continues to progress with cardiac rehab. He was ambulating on room air. He has been tolerating a diet and has had a bowel movement. Chest tube sutures will be removed the day of discharge. The patient is felt surgically stable for discharge today.   Latest Vital Signs: Blood pressure 117/77, pulse 81, temperature 97.7 F (36.5 C), temperature source Oral, resp. rate 18, height 5\' 7"  (1.702 m), weight 68.8 kg, SpO2 100 %.  Physical Exam:    General appearance: alert, cooperative and no distress Heart: regular rate and rhythm, S1, S2 normal, no murmur, click, rub or gallop Lungs: clear to auscultation bilaterally Abdomen: soft,  non-tender; bowel sounds normal; no masses,  no organomegaly Extremities: 1-2+ pitting edema in bilateral lower ext Wound: clean and dry    Discharge Condition: Stable and discharged to home.  Recent laboratory studies:  Lab Results  Component Value Date   WBC 6.3 12/06/2019   HGB 8.8 (L) 12/06/2019   HCT 28.6  (L) 12/06/2019   MCV 81.9 12/06/2019   PLT 410 (H) 12/06/2019   Lab Results  Component Value Date   NA 135 12/08/2019   K 4.8 12/08/2019   CL 100 12/08/2019   CO2 26 12/08/2019   CREATININE 1.25 (H) 12/08/2019   GLUCOSE 225 (H) 12/08/2019      Diagnostic Studies: DG Chest 2 View  Result Date: 11/27/2019 CLINICAL DATA:  Preop EXAM: CHEST - 2 VIEW COMPARISON:  02/23/2018 FINDINGS: Heart is normal size. No confluent airspace opacities or effusions. No acute bony abnormality. IMPRESSION: No active cardiopulmonary disease. Electronically Signed   By: Rolm Baptise M.D.   On: 11/27/2019 19:20   CARDIAC CATHETERIZATION  Result Date: 12/01/2019  Prox LAD to Mid LAD lesion is 95% stenosed.  3rd Diag lesion is 90% stenosed.  Prox RCA lesion is 95% stenosed.  Mid RCA lesion is 95% stenosed with 95% stenosed side branch in RV Branch.  Mid RCA to Dist RCA lesion is 95% stenosed.  There is moderate to severe left ventricular systolic dysfunction.  LV end diastolic pressure is mildly elevated.  The left ventricular ejection fraction is 25-35% by visual estimate.  James Braun is a 79 y.o. male  [id number] LOCATION:  FACILITY: [facility] PHYSICIAN: Quay Burow, M.D. 1941-02-23 DATE OF PROCEDURE:  11/27/2019 DATE OF DISCHARGE: CARDIAC CATHETERIZATION History obtained from chart review.DELONTE Braun is a 79 y.o.  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  He has a 96% success rate.  He was referred by Dr. Annamarie Major for cardiovascular evaluation because of new onset chest pain.  I last saw him in the office 06/17/2019. He does have a history of treated hypertension, diabetes and hyperlipidemia.  He is never had a heart attack or stroke.  He has no other cardiac risk factors.  He had a stressful event in his life 2 and half weeks ago chest pain subsequent to that and daily since.  The pain is not necessarily occur  with exertion.  It occurred randomly lasted minutes at a time without radiation.  He does exercise 3 days a week to 30 minutes at a time on the treadmill and does intermittent fasting as well.   I performed nuclear stress testing 01/09/2018 which was low risk and nonischemic and obtain a 2D echocardiogram 01/07/2018 which was essentially unremarkable as well.  He continues to exercise, and is active without limitation.  His only complaint is he does not have the energy that he used to.   He was recently evaluated underwent endoscopy revealing a large hiatal hernia.  Is being considered for laparoscopic hiatal hernia repair.  He was referred back to me by his gastroenterologist because of an exertional component to his chest pain to rule out an ischemic etiology.  A Myoview stress test showed inferior scar without ischemia clearly different than his last stress test done 2 years ago.  Because of this I elected to proceed with outpatient diagnostic coronary angiography to define his anatomy.   Mr. Kulzer has severe two-vessel disease with high-grade proximal  LAD disease, ostial second diagonal branch disease and severe high-grade tandem lesions in a tortuous dominant RCA with grade 2-3 left-to-right collaterals.  He does have moderate least severe LV dysfunction with an EF in the 30 to 35% range and more prominent inferior wall motion abnormality.  I suspect his LV dysfunction is ischemically mediated and represents hibernating myocardium.  He will need CABG.  He is on aspirin.  T CTS (Dr. Darcey Nora) has been consulted.  The sheath was removed and a TR band was placed on the right wrist to achieve patent hemostasis.  The patient left the lab in stable condition. Quay Burow. MD, [facility] 11/27/2019 3:11 PM   DG Chest Port 1 View  Result Date: 12/02/2019 CLINICAL DATA:  Sore chest, status post CABG EXAM: PORTABLE CHEST 1 VIEW COMPARISON:  12/01/2019 FINDINGS: Interval removal of right IJ venous sheath. Right arm PICC  terminates at the cavoatrial junction. Small left pleural effusion. Associated left basilar atelectasis. No frank interstitial edema. No pneumothorax. Cardiomegaly. Postsurgical changes related to prior CABG. Thoracic aortic atherosclerosis. Median sternotomy. IMPRESSION: Postsurgical changes related to prior CABG. Small left pleural effusion. Associated left basilar atelectasis. No pneumothorax. Right arm PICC terminates at the cavoatrial junction. Electronically Signed   By: Julian Hy M.D.   On: 12/02/2019 08:30   DG Chest Port 1 View  Result Date: 12/01/2019 CLINICAL DATA:  Status post coronary bypass graft. EXAM: PORTABLE CHEST 1 VIEW COMPARISON:  Nov 30, 2019. FINDINGS: Stable cardiomegaly. Status post coronary bypass graft. Bilateral chest tubes have been removed. No pneumothorax is noted. Right lung is clear. Mild left basilar atelectasis is noted with small left pleural effusion. Bony thorax is unremarkable. IMPRESSION: Bilateral chest tubes have been removed without evidence of pneumothorax. Mild left basilar subsegmental atelectasis is noted with small left pleural effusion. No pneumothorax is noted status post coronary bypass graft. Electronically Signed   By: Marijo Conception M.D.   On: 12/01/2019 08:10   DG Chest Port 1 View  Result Date: 11/30/2019 CLINICAL DATA:  Evaluate atelectasis. Status post CABG EXAM: PORTABLE CHEST 1 VIEW COMPARISON:  11/29/2019 FINDINGS: Postop changes from CABG procedure bilateral chest tubes in place. No pneumothorax visualized. Interval removal of pulmonary arterial catheter. Mild cardiac enlargement. Small left pleural effusion. Atelectasis is noted in the left base. IMPRESSION: 1. No pneumothorax status post CABG procedure. 2. Small left pleural effusion and left base atelectasis. Electronically Signed   By: Kerby Moors M.D.   On: 11/30/2019 09:45   DG Chest Port 1 View  Result Date: 11/29/2019 CLINICAL DATA:  Postop from CABG surgery. EXAM: PORTABLE  CHEST 1 VIEW COMPARISON:  11/28/2019 and earlier studies. FINDINGS: Since the previous exam, the endotracheal tube has been removed. The right internal jugular Swan-Ganz catheter, mediastinal tube and right and left chest tubes are stable. Cardiac silhouette is normal in size.  No mediastinal widening. There is left greater than right lung base opacity consistent with atelectasis, on the left associated with a small effusion. There are prominent bronchovascular markings, but no evidence of pulmonary edema. No pneumothorax. IMPRESSION: 1. No acute findings or evidence of an operative complication. 2. Lung base atelectasis, greater on the left. Small left pleural effusion. 3. Remaining support apparatus is stable and well positioned. Electronically Signed   By: Lajean Manes M.D.   On: 11/29/2019 09:58   DG Chest Port 1 View  Result Date: 11/28/2019 CLINICAL DATA:  Post CABG EXAM: PORTABLE CHEST 1 VIEW COMPARISON:  11/28/2019 FINDINGS: Changes of  CABG. Support devices are stable. No pneumothorax. Left base atelectasis. Improved aeration in the lungs. No effusions. No acute bony abnormality. IMPRESSION: Support devices are stable.  No pneumothorax. Improved aeration in the lungs.  Left base atelectasis. Electronically Signed   By: Rolm Baptise M.D.   On: 11/28/2019 16:37   DG Chest Portable 1 View  Result Date: 11/28/2019 CLINICAL DATA:  Status post CABG today. EXAM: PORTABLE CHEST 1 VIEW COMPARISON:  Yesterday. FINDINGS: Interval post CABG changes. Interval endotracheal tube with its tip 3 cm above the carina. Mediastinal and bilateral chest tubes. No pneumothorax seen. Right jugular Swan-Ganz catheter tip in the main pulmonary artery. Poor depth of inspiration with mild patchy and linear density throughout both lungs with mild-to-moderate central peribronchial thickening. Possible minimal left pleural effusion. Cholecystectomy clips. Unremarkable bones. IMPRESSION: 1. Interval CABG with endotracheal tube tip  3 cm above the carina. 2. Poor depth of inspiration with mild patchy and linear atelectasis throughout both lungs. 3. Possible minimal left pleural effusion. Electronically Signed   By: Claudie Revering M.D.   On: 11/28/2019 15:55   Myocardial Perfusion Imaging  Result Date: 11/19/2019  Nuclear stress EF: 37%. The left ventricular ejection fraction is moderately decreased (30-44%).  There was no ST segment deviation noted during stress.  Defect 1: There is a large defect of severe severity present in the basal inferoseptal, basal inferior, basal inferolateral, mid inferoseptal, mid inferior, mid inferolateral, apical anterior, apical septal, apical inferior, apical lateral and apex location.  Findings consistent with prior myocardial infarction. There is no evidence of ischemia .  This is an intermediate risk study.    ECHOCARDIOGRAM COMPLETE  Result Date: 11/27/2019    ECHOCARDIOGRAM REPORT   Patient Name:   KAHL RUPLEY Date of Exam: 11/27/2019 Medical Rec #:  [id number]      Height:       67.0 in Accession #:    [id number]     Weight:       155.0 lb Date of Birth:  1941/02/23      BSA:          1.815 m Patient Age:    89 years       BP:           142/72 mmHg Patient Gender: M              HR:           83 bpm. Exam Location:  Inpatient Procedure: 2D Echo Indications:    CAD; Pre-CABG  History:        Patient has prior history of Echocardiogram examinations, most                 recent 01/07/2018. Risk Factors:Diabetes, Hypertension and                 Dyslipidemia.  Sonographer:    Mikki Santee RDCS (AE) Referring Phys: Tharon Aquas TRIGT IMPRESSIONS  1. Left ventricular ejection fraction, by estimation, is 35 to 40%. The left ventricle has moderately decreased function. The left ventricle demonstrates regional wall motion abnormalities (see scoring diagram/findings for description). Left ventricular  diastolic parameters are indeterminate.  2. Right ventricular systolic function is normal. The right  ventricular size is normal.  3. Left atrial size was mild to moderately dilated.  4. The mitral valve is normal in structure. Mild mitral valve regurgitation. No evidence of mitral stenosis.  5. The aortic valve is tricuspid. Aortic valve regurgitation is not visualized. Mild  aortic valve sclerosis is present, with no evidence of aortic valve stenosis.  6. The inferior vena cava is normal in size with <50% respiratory variability, suggesting right atrial pressure of 8 mmHg. Conclusion(s)/Recommendation(s): Reduced LV function, most prominent from mid to apical segments of all walls. Suggests multivessel CAD. FINDINGS  Left Ventricle: Left ventricular ejection fraction, by estimation, is 35 to 40%. The left ventricle has moderately decreased function. The left ventricle demonstrates regional wall motion abnormalities. Severe hypokinesis of the left ventricular, mid-apical inferoseptal wall, anterolateral wall, anterior wall, inferolateral wall, inferior wall and anteroseptal wall. The left ventricular internal cavity size was normal in size. There is no left ventricular hypertrophy. Left ventricular diastolic parameters are indeterminate. Right Ventricle: The right ventricular size is normal. No increase in right ventricular wall thickness. Right ventricular systolic function is normal. Left Atrium: Left atrial size was mild to moderately dilated. Right Atrium: Right atrial size was not well visualized. Pericardium: There is no evidence of pericardial effusion. Mitral Valve: The mitral valve is normal in structure. Moderate mitral annular calcification. Mild mitral valve regurgitation. No evidence of mitral valve stenosis. Tricuspid Valve: The tricuspid valve is normal in structure. Tricuspid valve regurgitation is trivial. No evidence of tricuspid stenosis. Aortic Valve: The aortic valve is tricuspid. . There is mild thickening and mild calcification of the aortic valve. Aortic valve regurgitation is not visualized.  Mild aortic valve sclerosis is present, with no evidence of aortic valve stenosis. There is mild thickening of the aortic valve. There is mild calcification of the aortic valve. Pulmonic Valve: The pulmonic valve was grossly normal. Pulmonic valve regurgitation is mild. No evidence of pulmonic stenosis. Aorta: The aortic root, ascending aorta and aortic arch are all structurally normal, with no evidence of dilitation or obstruction. Venous: The inferior vena cava is normal in size with less than 50% respiratory variability, suggesting right atrial pressure of 8 mmHg. IAS/Shunts: The atrial septum is grossly normal.  LEFT VENTRICLE PLAX 2D LVIDd:         5.20 cm  Diastology LVIDs:         4.20 cm  LV e' lateral:   6.85 cm/s LV PW:         0.80 cm  LV E/e' lateral: 12.2 LV IVS:        0.80 cm LVOT diam:     2.50 cm LV SV:         81 LV SV Index:   44 LVOT Area:     4.91 cm  RIGHT VENTRICLE TAPSE (M-mode): 1.7 cm LEFT ATRIUM           Index       RIGHT ATRIUM          Index LA diam:      3.50 cm 1.93 cm/m  RA Area:     8.18 cm LA Vol (A2C): 51.8 ml 28.55 ml/m RA Volume:   12.90 ml 7.11 ml/m LA Vol (A4C): 62.0 ml 34.17 ml/m  AORTIC VALVE LVOT Vmax:   83.90 cm/s LVOT Vmean:  54.700 cm/s LVOT VTI:    0.164 m  AORTA Ao Root diam: 2.90 cm MITRAL VALVE MV Area (PHT): 5.84 cm     SHUNTS MV Decel Time: 130 msec     Systemic VTI:  0.16 m MV E velocity: 83.90 cm/s   Systemic Diam: 2.50 cm MV A velocity: 127.00 cm/s MV E/A ratio:  0.66 Buford Dresser MD Electronically signed by Buford Dresser MD Signature Date/Time: 11/27/2019/7:16:22 PM  Final    ECHO INTRAOPERATIVE TEE  Result Date: 11/28/2019  *INTRAOPERATIVE TRANSESOPHAGEAL REPORT *  Patient Name:   THESEUS MALBROUGH Date of Exam: 11/28/2019 Medical Rec #:  [id number]      Height:       67.0 in Accession #:    [id number]     Weight:       149.7 lb Date of Birth:  [birth year]      BSA:          1.79 m Patient Age:    22 years       BP:           138/82 mmHg  Patient Gender: M              HR:           77 bpm. Exam Location:  Inpatient Transesophogeal exam was perform intraoperatively during surgical procedure. Patient was closely monitored under general anesthesia during the entirety of examination. Indications:     I25.110 Atherosclerotic heart disease of native coronary artery                  with unstable angina pectoris Performing Phys: Adele Barthel MD Diagnosing Phys: Adele Barthel MD Complications: No known complications during this procedure. POST-OP IMPRESSIONS - Left Ventricle: has mildly reduced systolic function, with an ejection fraction of 30%. - Aorta: The aorta appears unchanged from pre-bypass. - Left Atrial Appendage: The left atrial appendage appears unchanged from pre-bypass. - Aortic Valve: The aortic valve appears unchanged from pre-bypass. - Mitral Valve: The mitral valve appears unchanged from pre-bypass. - Tricuspid Valve: There is trace regurgitation. - Interatrial Septum: Leftward bowing of the intratrial septum suggesting increased right atrial pressure. - Pericardium: The pericardium appears unchanged from pre-bypass. PRE-OP FINDINGS  Left Ventricle: The left ventricle has mild-moderately reduced systolic function, with an ejection fraction of 40-45%. The cavity size was normal. There is no increase in left ventricular wall thickness. Right Ventricle: The right ventricle has normal systolic function. The cavity was mildly enlarged. There is mild increase in right ventricular wall thickness. Left Atrium: Left atrial size was normal in size. The left atrial appendage is well visualized and there is no evidence of thrombus present. Right Atrium: Right atrial size was normal in size. A Chiari network is visualized. Interatrial Septum: No atrial level shunt detected by color flow Doppler. Pericardium: There is no evidence of pericardial effusion. Mitral Valve: The mitral valve is normal in structure. Mitral valve regurgitation is mild by color  flow Doppler. The MR jet is centrally-directed. Tricuspid Valve: The tricuspid valve was normal in structure. Tricuspid valve regurgitation was not visualized by color flow Doppler. Aortic Valve: The aortic valve is tricuspid. There is mild thickening and reduced mobility of the non-coronary cusp of the aortic valve. Aortic valve regurgitation was not visualized by color flow Doppler. There is no evidence of aortic valve stenosis. Pulmonic Valve: The pulmonic valve was normal in structure. Pulmonic valve regurgitation is trivial by color flow Doppler. +--------------+-------++ LEFT VENTRICLE        +--------------+-------++ PLAX 2D               +--------------+-------++ LVIDd:        4.46 cm +--------------+-------++ LVIDs:        3.86 cm +--------------+-------++ LV SV:        26 ml   +--------------+-------++ LV SV Index:  14.59   +--------------+-------++                       +--------------+-------++ +-------------+-----------++  AORTIC VALVE             +-------------+-----------++ AV Vmax:     124.00 cm/s +-------------+-----------++ AV Vmean:    93.200 cm/s +-------------+-----------++ AV VTI:      0.301 m     +-------------+-----------++ AV Peak Grad:6.2 mmHg    +-------------+-----------++ AV Mean Grad:4.0 mmHg    +-------------+-----------++  Adele Barthel MD Electronically signed by Adele Barthel MD Signature Date/Time: 11/28/2019/5:29:15 PM    Final    VAS US DOPPLER PRE CABG  Result Date: 12/02/2019 PREOPERATIVE VASCULAR EVALUATION  Indications:  Pre-CABG. Risk Factors: Hypertension, hyperlipidemia, Diabetes. Performing Technologist: Maudry Mayhew MHA, RDMS, RVT, RDCS  Examination Guidelines: A complete evaluation includes B-mode imaging, spectral Doppler, color Doppler, and power Doppler as needed of all accessible portions of each vessel. Bilateral testing is considered an integral part of a complete examination. Limited examinations for  reoccurring indications may be performed as noted.  Right Carotid Findings: +----------+--------+--------+--------+-----------------------+--------+           PSV cm/sEDV cm/sStenosisDescribe               Comments +----------+--------+--------+--------+-----------------------+--------+ CCA Prox  158     28                                              +----------+--------+--------+--------+-----------------------+--------+ CCA Distal67      18                                              +----------+--------+--------+--------+-----------------------+--------+ ICA Prox  51      16              smooth and heterogenous         +----------+--------+--------+--------+-----------------------+--------+ ICA Distal110     34                                              +----------+--------+--------+--------+-----------------------+--------+ ECA       65      8                                               +----------+--------+--------+--------+-----------------------+--------+ Portions of this table do not appear on this page. +----------+--------+-------+----------------+------------+           PSV cm/sEDV cmsDescribe        Arm Pressure +----------+--------+-------+----------------+------------+ Subclavian113            Multiphasic, WNL             +----------+--------+-------+----------------+------------+ +---------+--------+--+--------+-+---------+ VertebralPSV cm/s23EDV cm/s6Antegrade +---------+--------+--+--------+-+---------+ Left Carotid Findings: +----------+--------+--------+--------+-----------------------+--------+           PSV cm/sEDV cm/sStenosisDescribe               Comments +----------+--------+--------+--------+-----------------------+--------+ CCA Prox  146     32                                              +----------+--------+--------+--------+-----------------------+--------+  CCA Distal78      16                                               +----------+--------+--------+--------+-----------------------+--------+ ICA Prox  74      18                                              +----------+--------+--------+--------+-----------------------+--------+ ICA Distal178     53                                              +----------+--------+--------+--------+-----------------------+--------+ ECA       72      8               smooth and heterogenous         +----------+--------+--------+--------+-----------------------+--------+ +----------+--------+--------+----------------+------------+ SubclavianPSV cm/sEDV cm/sDescribe        Arm Pressure +----------+--------+--------+----------------+------------+           70              Multiphasic, WNL             +----------+--------+--------+----------------+------------+ +---------+--------+---+--------+--+---------+ VertebralPSV cm/s134EDV cm/s27Antegrade +---------+--------+---+--------+--+---------+  ABI Findings: +--------+-----------------+-----+---------+----------------------------------+ Right   Rt Pressure      IndexWaveform Comment                                    (mmHg)                                                            +--------+-----------------+-----+---------+----------------------------------+ Brachial                      triphasicPressure not obtained due to TR                                           band                               +--------+-----------------+-----+---------+----------------------------------+ PTA                           triphasic                                   +--------+-----------------+-----+---------+----------------------------------+ DP                            triphasic                                   +--------+-----------------+-----+---------+----------------------------------+ +--------+------------------+-----+---------+-------+  Left    Lt Pressure  (mmHg)IndexWaveform Comment +--------+------------------+-----+---------+-------+ [id number]                    triphasic        +--------+------------------+-----+---------+-------+ PTA                            triphasic        +--------+------------------+-----+---------+-------+ DP                             triphasic        +--------+------------------+-----+---------+-------+  Right Doppler Findings: +-----------+--------+-----+---------+------------------------------------+ Site       PressureIndexDoppler  Comments                             +-----------+--------+-----+---------+------------------------------------+ Brachial                triphasicPressure not obtained due to TR band +-----------+--------+-----+---------+------------------------------------+ Radial                  triphasic                                     +-----------+--------+-----+---------+------------------------------------+ Ulnar                   triphasic                                     +-----------+--------+-----+---------+------------------------------------+ Palmar Arch                      Not evaluated due to TR band         +-----------+--------+-----+---------+------------------------------------+  Left Doppler Findings: +-----------+--------+-----+---------+-----------------------------------------+ Site       PressureIndexDoppler  Comments                                  +-----------+--------+-----+---------+-----------------------------------------+ Brachial   138          triphasic                                          +-----------+--------+-----+---------+-----------------------------------------+ Radial                  triphasic                                          +-----------+--------+-----+---------+-----------------------------------------+ Ulnar                   triphasic                                           +-----------+--------+-----+---------+-----------------------------------------+ Palmar Arch                      Signal is unaffected with radial  compression, obliterates with ulnar                                        compression.                              +-----------+--------+-----+---------+-----------------------------------------+  Summary: Right Carotid: Velocities in the right ICA are consistent with a 1-39% stenosis. Left Carotid: Velocities in the left ICA are consistent with a 1-39% stenosis. Vertebrals:  Bilateral vertebral arteries demonstrate antegrade flow. Subclavians: Normal flow hemodynamics were seen in bilateral subclavian              arteries. Bilateral lower extremities: bilateral pedal waveforms are within normal limits. Right Upper Extremity: No significant arterial obstruction detected in the right upper extremity.  Left Upper Extremity: No significant arterial obstruction detected in the left upper extremity.Palmar arch Allen's test: Doppler waveforms remain within normal limits with left radial compression. Doppler waveform obliterate with left ulnar compression.  Electronically signed by Deitra Mayo MD on 12/02/2019 at 5:31:29 PM.    Final    ECHOCARDIOGRAM LIMITED  Result Date: 12/03/2019    ECHOCARDIOGRAM LIMITED REPORT   Patient Name:   RESHAWN GHATTAS Date of Exam: 12/03/2019 Medical Rec #:  [id number]      Height:       67.0 in Accession #:    [id number]     Weight:       156.7 lb Date of Birth:  Jan 03, 1941      BSA:          1.823 m Patient Age:    73 years       BP:           105/62 mmHg Patient Gender: M              HR:           103 bpm. Exam Location:  Inpatient Procedure: 2D Echo Indications:    414.8 cardiomyopathy  History:        Patient has prior history of Echocardiogram examinations, most                 recent 11/28/2019. CAD, Prior CABG; Risk Factors:Hypertension,                 Dyslipidemia and  Diabetes.  Sonographer:    Jannett Celestine RDCS (AE) Referring Phys: [place] TRIGT  Sonographer Comments: No subcostal window. limited IMPRESSIONS  1. Diffuse hypokinesis worse in the septum and apex with variability due to PVCls . Left ventricular ejection fraction, by estimation, is 35%. The left ventricular internal cavity size was mildly dilated.  2. Right ventricular systolic function is mildly reduced. The right ventricular size is mildly enlarged.  3. Trivial mitral valve regurgitation.  4. The aortic valve is tricuspid. Mild to moderate aortic valve sclerosis/calcification is present, without any evidence of aortic stenosis.  5. The inferior vena cava not visualized. FINDINGS  Left Ventricle: Diffuse hypokinesis worse in the septum and apex with variability due to PVCls. Left ventricular ejection fraction, by estimation, is 35%. The left ventricular internal cavity size was mildly dilated. Right Ventricle: The right ventricular size is mildly enlarged. Right vetricular wall thickness was not assessed. Right ventricular systolic function is mildly reduced. Left Atrium: Left atrial size was normal in size. Right Atrium: Right atrial size  was normal in size. Pericardium: Trivial pericardial effusion is present. Mitral Valve: There is mild thickening of the mitral valve leaflet(s). There is mild calcification of the mitral valve leaflet(s). Moderate mitral annular calcification. Trivial mitral valve regurgitation. Tricuspid Valve: The tricuspid valve is normal in structure. Tricuspid valve regurgitation is mild. Aortic Valve: The aortic valve is tricuspid. Mild to moderate aortic valve sclerosis/calcification is present, without any evidence of aortic stenosis. Venous: The inferior vena cava not visualized. IAS/Shunts: The interatrial septum was not well visualized.  AORTIC VALVE LVOT Vmax:   85.00 cm/s LVOT Vmean:  64.600 cm/s LVOT VTI:    0.131 m  SHUNTS Systemic VTI: 0.13 m Jenkins Rouge MD Electronically  signed by Jenkins Rouge MD Signature Date/Time: 12/03/2019/5:19:43 PM    Final    [place] EKG SITE RITE  Result Date: 12/01/2019 If Site Rite image not attached, placement could not be confirmed due to current cardiac rhythm.  Discharge Instructions     Amb Referral to Cardiac Rehabilitation   Complete by: As directed    Diagnosis: CABG   CABG X ___: 3   After initial evaluation and assessments completed: Virtual Based Care may be provided alone or in conjunction with Phase 2 Cardiac Rehab based on patient barriers.: Yes       Discharge Medications: Allergies as of 12/08/2019       Reactions   Atorvastatin Other (See Comments)   myalgias   Morphine And Related Other (See Comments)    hypotension   Levofloxacin Rash   Septra [sulfamethoxazole-trimethoprim] Rash        Medication List     STOP taking these medications    diltiazem 240 MG 24 hr capsule Commonly known as: TIAZAC   isosorbide mononitrate 30 MG 24 hr tablet Commonly known as: IMDUR   metoprolol tartrate 50 MG tablet Commonly known as: LOPRESSOR   Potassium Citrate 15 MEQ (1620 MG) Tbcr   RED YEAST RICE PO   sildenafil 20 MG tablet Commonly known as: REVATIO   sodium chloride 5 % ophthalmic solution Commonly known as: MURO 128   telmisartan 80 MG tablet Commonly known as: MICARDIS   triamterene-hydrochlorothiazide 37.5-25 MG tablet Commonly known as: MAXZIDE-25       TAKE these medications    alum hydroxide-mag trisilicate [id number] MG Chew chewable tablet Commonly known as: GAVISCON Chew 2 tablets by mouth 3 (three) times daily as needed for indigestion or heartburn.   amiodarone 200 MG tablet Commonly known as: PACERONE Take 1 tablet (200 mg total) by mouth daily.   apixaban 2.5 MG Tabs tablet Commonly known as: ELIQUIS Take 1 tablet (2.5 mg total) by mouth 2 (two) times daily.   aspirin 81 MG EC tablet Take 1 tablet (81 mg total) by mouth daily. Start taking on: Dec 09, 2019     citalopram 10 MG tablet Commonly known as: CELEXA Take 1 tablet (10 mg total) by mouth daily. Start taking on: Dec 09, 2019   digoxin 0.125 MG tablet Commonly known as: LANOXIN Take 1 tablet (0.125 mg total) by mouth daily. Start taking on: Dec 09, 2019   finasteride 5 MG tablet Commonly known as: PROSCAR Take 1 tablet (5 mg total) by mouth daily.   furosemide 20 MG tablet Commonly known as: Lasix Take 1 tablet (20 mg total) by mouth daily. Daily, as needed for lower leg swelling.   GLUCOSAMINE-CHONDROITIN PO Take 1 tablet by mouth 2 (two) times daily.   metFORMIN 500 MG 24 hr tablet Commonly  known as: GLUCOPHAGE-XR Take 1 tablet (500 mg total) by mouth 2 (two) times daily.   multivitamin tablet Take 1 tablet by mouth daily.   omeprazole 20 MG capsule Commonly known as: PRILOSEC Take 1 capsule (20 mg total) by mouth 2 (two) times daily before a meal. What changed: when to take this   potassium chloride SA 20 MEQ tablet Commonly known as: KLOR-CON Take 2 tablets (40 mEq total) by mouth 2 (two) times daily.   sacubitril-valsartan 24-26 MG Commonly known as: ENTRESTO Take 1 tablet by mouth 2 (two) times daily.   spironolactone 25 MG tablet Commonly known as: ALDACTONE Take 1 tablet (25 mg total) by mouth daily. Start taking on: Dec 09, 2019   timolol 0.5 % ophthalmic solution Commonly known as: TIMOPTIC Place 1 drop into both eyes 2 (two) times daily.   traMADol 50 MG tablet Commonly known as: ULTRAM Take 1 tablet (50 mg total) by mouth at bedtime as needed for moderate pain.               Durable Medical Equipment  (From admission, onward)           Start     Ordered   12/08/19 1210  For home use only DME Walker rolling  Once    Question Answer Comment  Walker: With 5 Inch Wheels   Patient needs a walker to treat with the following condition Balance problems      12/08/19 1209           The patient has been discharged on:   1.Beta  Blocker:  Yes [ x  ]                              No   [   ]                              If No, reason:  2.Ace Inhibitor/ARB: Yes [ x  ]                                     No  [    ]                                     If No, reason:  3.Statin:   Yes [   ]                  No  [ x  ]                  If No, reason:Allergy-myalgia  4.[place]:  Yes  [ x  ]                  No   [   ]                  If No, reason:  Follow Up Appointments: Follow-up Information     [name], MD Follow up.   Specialties: Cardiology, Radiology Contact information: [address] [place] [address] [phone]         [name], Collier Salina, MD. Go on 01/07/2020.   Specialty: Cardiothoracic Surgery Why: PA/LAT CXR to be  taken (at [facility] which is in the same building as Dr. Lucianne Lei Trigt's office) on 06/23 at 1:00 pm;Appointment time is at 1:30 pm Contact information: [address] [place] [address] [place] Follow up on 01/06/2020.   Specialty: Cardiology Why: 9:30 AM. [facility] (Dr. Bensimhon's office).  Parking Garage Code [id number] Contact information: [address] [id number] [place] [phone]           Signed: Alvester Chou 12/08/2019, 12:33 PM   patient examined and medical record reviewed,agree with above note. Tharon Aquas Trigt III 12/09/2019

## 2019-11-28 NOTE — Brief Op Note (Signed)
11/27/2019 - 11/28/2019  7:17 AM  PATIENT:  James Braun  79 y.o. male  PRE-OPERATIVE DIAGNOSIS:  CAD  POST-OPERATIVE DIAGNOSIS:  * No post-op diagnosis entered *  PROCEDURE:  Procedure(s): CORONARY ARTERY BYPASS GRAFTING (CABG) x3, using left internal mammary artery and Saphenous vein harvested endoscopically (N/A) TRANSESOPHAGEAL ECHOCARDIOGRAM (TEE) (N/A) [place]-LAD SVG-DIAG SVG-PD ENDOSCOPIC GREATER SAPHENOUS VEIN HARVEST (RIGHT)  SURGEON:  Surgeon(s) and Role:    [name], MD - Primary  PHYSICIAN ASSISTANT: Dquan Cortopassi PA-C  ANESTHESIA:   general  EBL:  [id number] mL   BLOOD ADMINISTERED:2 UNITS PRBC'S, 2 UNITS FFP, CRYO and PLATELETS  DRAINS: LEFT PLEURAL AND MEDIASTINAL CHEST TUBES   LOCAL MEDICATIONS USED:  NONE  SPECIMEN:  No Specimen  DISPOSITION OF SPECIMEN:  N/A  COUNTS:  YES  TOURNIQUET:  * No tourniquets in log *  DICTATION: .Other Dictation: Dictation Number [id number]  PLAN OF CARE: Admit to inpatient   PATIENT DISPOSITION:  ICU - intubated and hemodynamically stable.   Delay start of Pharmacological VTE agent (>24hrs) due to surgical blood loss or risk of bleeding: yes  COMPLICATIONS: NO KNOWN

## 2019-11-29 ENCOUNTER — Inpatient Hospital Stay (HOSPITAL_COMMUNITY): Payer: PPO

## 2019-11-29 DIAGNOSIS — Z951 Presence of aortocoronary bypass graft: Secondary | ICD-10-CM

## 2019-11-29 LAB — BPAM FFP
Blood Product Expiration Date: 202105162359
Blood Product Expiration Date: 202105162359
ISSUE DATE / TIME: 202105141116
ISSUE DATE / TIME: 202105141116
Unit Type and Rh: 1700
Unit Type and Rh: 7300

## 2019-11-29 LAB — PREPARE CRYOPRECIPITATE
Unit division: 0
Unit division: 0

## 2019-11-29 LAB — BPAM CRYOPRECIPITATE
Blood Product Expiration Date: 202105141920
Blood Product Expiration Date: 202105141925
ISSUE DATE / TIME: 202105141359
ISSUE DATE / TIME: 202105141359
Unit Type and Rh: 5100
Unit Type and Rh: 5100

## 2019-11-29 LAB — BASIC METABOLIC PANEL
Anion gap: 6 (ref 5–15)
BUN: 19 mg/dL (ref 8–23)
CO2: 22 mmol/L (ref 22–32)
Calcium: 7.5 mg/dL — ABNORMAL LOW (ref 8.9–10.3)
Chloride: 108 mmol/L (ref 98–111)
Creatinine, Ser: 1.21 mg/dL (ref 0.61–1.24)
GFR calc Af Amer: >60 mL/min (ref >60)
GFR calc non Af Amer: 57 mL/min — ABNORMAL LOW (ref >60)
Glucose, Bld: 147 mg/dL — ABNORMAL HIGH (ref 70–99)
Potassium: 4.6 mmol/L (ref 3.5–5.1)
Sodium: 136 mmol/L (ref 135–145)

## 2019-11-29 LAB — COMPREHENSIVE METABOLIC PANEL
ALT: 50 U/L — ABNORMAL HIGH (ref 0–44)
AST: 92 U/L — ABNORMAL HIGH (ref 15–41)
Albumin: 3.3 g/dL — ABNORMAL LOW (ref 3.5–5.0)
Alkaline Phosphatase: 34 U/L — ABNORMAL LOW (ref 38–126)
Anion gap: 9 (ref 5–15)
BUN: 18 mg/dL (ref 8–23)
CO2: 22 mmol/L (ref 22–32)
Calcium: 7.6 mg/dL — ABNORMAL LOW (ref 8.9–10.3)
Chloride: 108 mmol/L (ref 98–111)
Creatinine, Ser: 1.29 mg/dL — ABNORMAL HIGH (ref 0.61–1.24)
GFR calc Af Amer: >60 mL/min (ref >60)
GFR calc non Af Amer: 52 mL/min — ABNORMAL LOW (ref >60)
Glucose, Bld: 127 mg/dL — ABNORMAL HIGH (ref 70–99)
Potassium: 3.9 mmol/L (ref 3.5–5.1)
Sodium: 139 mmol/L (ref 135–145)
Total Bilirubin: 1.6 mg/dL — ABNORMAL HIGH (ref 0.3–1.2)
Total Protein: 5.1 g/dL — ABNORMAL LOW (ref 6.5–8.1)

## 2019-11-29 LAB — POCT I-STAT 7, (LYTES, BLD GAS, ICA,H+H)
Acid-Base Excess: 0 mmol/L (ref 0.0–2.0)
Acid-base deficit: 2 mmol/L (ref 0.0–2.0)
Bicarbonate: 25.7 mmol/L (ref 20.0–28.0)
Bicarbonate: 20.1 mmol/L (ref 20.0–28.0)
Calcium, Ion: 1.07 mmol/L — ABNORMAL LOW (ref 1.15–1.40)
Calcium, Ion: 1.07 mmol/L — ABNORMAL LOW (ref 1.15–1.40)
HCT: 32 % — ABNORMAL LOW (ref 39.0–52.0)
HCT: 28 % — ABNORMAL LOW (ref 39.0–52.0)
Hemoglobin: 10.9 g/dL — ABNORMAL LOW (ref 13.0–17.0)
Hemoglobin: 9.5 g/dL — ABNORMAL LOW (ref 13.0–17.0)
O2 Saturation: 93 %
O2 Saturation: 94 %
Patient temperature: 36.6
Patient temperature: 37.7
Potassium: 2.7 mmol/L — CL (ref 3.5–5.1)
Potassium: 3.6 mmol/L (ref 3.5–5.1)
Sodium: 143 mmol/L (ref 135–145)
Sodium: 141 mmol/L (ref 135–145)
TCO2: 27 mmol/L (ref 22–32)
TCO2: 21 mmol/L — ABNORMAL LOW (ref 22–32)
pCO2 arterial: 44.8 mmHg (ref 32.0–48.0)
pCO2 arterial: 27.5 mmHg — ABNORMAL LOW (ref 32.0–48.0)
pH, Arterial: 7.365 (ref 7.350–7.450)
pH, Arterial: 7.476 — ABNORMAL HIGH (ref 7.350–7.450)
pO2, Arterial: 69 mmHg — ABNORMAL LOW (ref 83.0–108.0)
pO2, Arterial: 68 mmHg — ABNORMAL LOW (ref 83.0–108.0)

## 2019-11-29 LAB — CBC
HCT: 28.8 % — ABNORMAL LOW (ref 39.0–52.0)
HCT: 30.2 % — ABNORMAL LOW (ref 39.0–52.0)
Hemoglobin: 9.2 g/dL — ABNORMAL LOW (ref 13.0–17.0)
Hemoglobin: 9.3 g/dL — ABNORMAL LOW (ref 13.0–17.0)
MCH: 25.6 pg — ABNORMAL LOW (ref 26.0–34.0)
MCH: 25.1 pg — ABNORMAL LOW (ref 26.0–34.0)
MCHC: 31.9 g/dL (ref 30.0–36.0)
MCHC: 30.8 g/dL (ref 30.0–36.0)
MCV: 80.2 fL (ref 80.0–100.0)
MCV: 81.6 fL (ref 80.0–100.0)
Platelets: 194 10*3/uL (ref 150–400)
Platelets: 177 10*3/uL (ref 150–400)
RBC: 3.59 MIL/uL — ABNORMAL LOW (ref 4.22–5.81)
RBC: 3.7 MIL/uL — ABNORMAL LOW (ref 4.22–5.81)
RDW: 21.4 % — ABNORMAL HIGH (ref 11.5–15.5)
RDW: 22.5 % — ABNORMAL HIGH (ref 11.5–15.5)
WBC: 10.4 10*3/uL (ref 4.0–10.5)
WBC: 8.6 10*3/uL (ref 4.0–10.5)
nRBC: 0 % (ref 0.0–0.2)
nRBC: 0 % (ref 0.0–0.2)

## 2019-11-29 LAB — PREPARE PLATELET PHERESIS
Unit division: 0
Unit division: 0

## 2019-11-29 LAB — BPAM PLATELET PHERESIS
Blood Product Expiration Date: 202105152359
Blood Product Expiration Date: 202105152359
ISSUE DATE / TIME: 202105141324
ISSUE DATE / TIME: 202105141324
Unit Type and Rh: 5100
Unit Type and Rh: 5100

## 2019-11-29 LAB — GLUCOSE, CAPILLARY
Glucose-Capillary: 103 mg/dL — ABNORMAL HIGH (ref 70–99)
Glucose-Capillary: 103 mg/dL — ABNORMAL HIGH (ref 70–99)
Glucose-Capillary: 105 mg/dL — ABNORMAL HIGH (ref 70–99)
Glucose-Capillary: 107 mg/dL — ABNORMAL HIGH (ref 70–99)
Glucose-Capillary: 108 mg/dL — ABNORMAL HIGH (ref 70–99)
Glucose-Capillary: 108 mg/dL — ABNORMAL HIGH (ref 70–99)
Glucose-Capillary: 109 mg/dL — ABNORMAL HIGH (ref 70–99)
Glucose-Capillary: 109 mg/dL — ABNORMAL HIGH (ref 70–99)
Glucose-Capillary: 115 mg/dL — ABNORMAL HIGH (ref 70–99)
Glucose-Capillary: 115 mg/dL — ABNORMAL HIGH (ref 70–99)
Glucose-Capillary: 116 mg/dL — ABNORMAL HIGH (ref 70–99)
Glucose-Capillary: 119 mg/dL — ABNORMAL HIGH (ref 70–99)
Glucose-Capillary: 119 mg/dL — ABNORMAL HIGH (ref 70–99)
Glucose-Capillary: 121 mg/dL — ABNORMAL HIGH (ref 70–99)
Glucose-Capillary: 152 mg/dL — ABNORMAL HIGH (ref 70–99)
Glucose-Capillary: 160 mg/dL — ABNORMAL HIGH (ref 70–99)

## 2019-11-29 LAB — MAGNESIUM
Magnesium: 2.5 mg/dL — ABNORMAL HIGH (ref 1.7–2.4)
Magnesium: 2.5 mg/dL — ABNORMAL HIGH (ref 1.7–2.4)

## 2019-11-29 LAB — PREPARE FRESH FROZEN PLASMA: Unit division: 0

## 2019-11-29 LAB — COOXEMETRY PANEL
Carboxyhemoglobin: 1.4 % (ref 0.5–1.5)
Methemoglobin: 1.1 % (ref 0.0–1.5)
O2 Saturation: 61.5 %
Total hemoglobin: 9.3 g/dL — ABNORMAL LOW (ref 12.0–16.0)

## 2019-11-29 MED ORDER — POTASSIUM CHLORIDE 10 MEQ/50ML IV SOLN
10.0000 meq | INTRAVENOUS | Status: AC
  Administered 2019-11-29 (×3): 10 meq via INTRAVENOUS
  Filled 2019-11-29 (×3): qty 50

## 2019-11-29 MED ORDER — SODIUM CHLORIDE 0.9 % IV SOLN
INTRAVENOUS | Status: DC | PRN
  Administered 2019-11-29: 500 mL via INTRAVENOUS

## 2019-11-29 MED ORDER — ENOXAPARIN SODIUM 30 MG/0.3ML ~~LOC~~ SOLN
30.0000 mg | Freq: Every day | SUBCUTANEOUS | Status: DC
  Administered 2019-11-30 – 2019-12-01 (×2): 30 mg via SUBCUTANEOUS
  Filled 2019-11-29 (×2): qty 0.3

## 2019-11-29 MED ORDER — FUROSEMIDE 10 MG/ML IJ SOLN
20.0000 mg | Freq: Two times a day (BID) | INTRAMUSCULAR | Status: AC
  Administered 2019-11-29 – 2019-11-30 (×3): 20 mg via INTRAVENOUS
  Filled 2019-11-29 (×3): qty 2

## 2019-11-29 MED ORDER — INSULIN ASPART 100 UNIT/ML ~~LOC~~ SOLN
0.0000 [IU] | SUBCUTANEOUS | Status: DC
  Administered 2019-11-29 – 2019-11-30 (×4): 2 [IU] via SUBCUTANEOUS

## 2019-11-29 NOTE — Plan of Care (Signed)
Understands post-op course as it pertains to activity and extubation.   Extubated @ 0310 to 4L [place], C&DB exercises.

## 2019-11-29 NOTE — Procedures (Signed)
Extubation Procedure Note  Patient Details:   Name: James Braun DOB: 1941-02-21 MRN: [id number]   Airway Documentation:    Vent end date: (not recorded) Vent end time: (not recorded)   Evaluation  O2 sats: stable throughout Complications: No apparent complications Patient did tolerate procedure well. Bilateral Breath Sounds: Rhonchi, Diminished   Yes   Weaning Mechanics Prior to Extubation NIF-24, VC--1.2L, and Positive cuff leak  Placed on 4L [place] and tolerating Well  Darryl Nestle F 11/29/2019, 3:15 AM

## 2019-11-29 NOTE — Progress Notes (Signed)
Subjective:  Doing well post CABG   Objective:  Vitals:   11/29/19 0500 11/29/19 0530 11/29/19 0600 11/29/19 0700  BP: 98/62 110/69 102/62 111/73  Pulse: 90 91 91 92  Resp: 19 (!) 21 (!) 23 20  Temp: 100.2 F (37.9 C) 100.2 F (37.9 C) 100 F (37.8 C) 99.9 F (37.7 C)  TempSrc:      SpO2: 96% 97% 95% 95%  Weight:      Height:        Intake/Output from previous day:  Intake/Output Summary (Last 24 hours) at 11/29/2019 0740 Last data filed at 11/29/2019 0700 Gross per 24 hour  Intake 7463.25 ml  Output 4714 ml  Net 2749.25 ml    Physical Exam: Affect appropriate Healthy:  appears stated age HEENT: normal  Right IJ  Neck supple with no adenopathy JVP normal no bruits no thyromegaly Lungs clear with no wheezing and good diaphragmatic motion Heart:  S1/S2 post sternotomy  Abdomen: benighn, BS positve, no tenderness, no AAA no bruit.  No HSM or HJR Distal pulses intact with no bruits Compression devices on LE;s  Neuro non-focal Skin warm and dry No muscular weakness   Lab Results: Basic Metabolic Panel: Recent Labs    11/28/19 2244 11/28/19 2244 11/29/19 0251 11/29/19 0558  NA 136   < > 141 139  K 3.6   < > 3.6 3.9  CL 106  --   --  108  CO2 23  --   --  22  GLUCOSE 123*  --   --  127*  BUN 15  --   --  18  CREATININE 1.13  --   --  1.29*  CALCIUM 7.1*  --   --  7.6*  MG 2.5*  --   --  2.5*   < > = values in this interval not displayed.   Liver Function Tests: Recent Labs    11/27/19 1847 11/29/19 0558  AST 21 92*  ALT 20 50*  ALKPHOS 45 34*  BILITOT 0.5 1.6*  PROT 6.7 5.1*  ALBUMIN 3.9 3.3*   No results for input(s): LIPASE, AMYLASE in the last 72 hours. CBC: Recent Labs    11/28/19 2244 11/28/19 2244 11/29/19 0251 11/29/19 0558  WBC 10.3  --   --  10.4  HGB 9.5*   < > 9.5* 9.2*  HCT 30.0*   < > 28.0* 28.8*  MCV 79.6*  --   --  80.2  PLT 198  --   --  194   < > = values in this interval not displayed.   Cardiac Enzymes: No  results for input(s): CKTOTAL, CKMB, CKMBINDEX, TROPONINI in the last 72 hours. BNP: Invalid input(s): POCBNP D-Dimer: No results for input(s): DDIMER in the last 72 hours. Hemoglobin A1C: Recent Labs    11/27/19 1847  HGBA1C 7.2*   Fasting Lipid Panel: No results for input(s): CHOL, HDL, LDLCALC, TRIG, CHOLHDL, LDLDIRECT in the last 72 hours. Thyroid Function Tests: Recent Labs    11/28/19 0511  TSH 4.384   Anemia Panel: No results for input(s): VITAMINB12, FOLATE, FERRITIN, TIBC, IRON, RETICCTPCT in the last 72 hours.  Imaging: DG Chest 2 View  Result Date: 11/27/2019 CLINICAL DATA:  Preop EXAM: CHEST - 2 VIEW COMPARISON:  02/23/2018 FINDINGS: Heart is normal size. No confluent airspace opacities or effusions. No acute bony abnormality. IMPRESSION: No active cardiopulmonary disease. Electronically Signed   By: James Braun M.D.   On: 11/27/2019 19:20  CARDIAC CATHETERIZATION  Result Date: 11/27/2019  Prox LAD to Mid LAD lesion is 95% stenosed.  3rd Diag lesion is 90% stenosed.  Prox RCA lesion is 95% stenosed.  Mid RCA lesion is 95% stenosed with 95% stenosed side branch in RV Branch.  Mid RCA to Dist RCA lesion is 95% stenosed.  There is moderate to severe left ventricular systolic dysfunction.  LV end diastolic pressure is mildly elevated.  The left ventricular ejection fraction is 25-35% by visual estimate.  James Braun is a 79 y.o. male  [id number] LOCATION:  FACILITY: [facility] PHYSICIAN: James Braun, M.D. [birth year] DATE OF PROCEDURE:  11/27/2019 DATE OF DISCHARGE: CARDIAC CATHETERIZATION History obtained from chart review.James Braun is a 79 y.o.  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 He has a 96% success rate. He was referred by Dr. Annamarie Major for cardiovascular evaluation because of new onset chest pain.I last saw him in the office 06/17/2019.He does have a  history of treated hypertension, diabetes and hyperlipidemia. He is never had a heart attack or stroke. He has no other cardiac risk factors. He had a stressful event in his life 2 and half weeks ago chest pain subsequent to that and daily since. The pain is not necessarily occur with exertion. Itoccurredrandomly lastedminutes at a time without radiation. He does exercise 3 days a week to 30 minutes at a time on the treadmill and does intermittent fasting as well.  I performed nuclear stress testing 01/09/2018 which was low risk and nonischemic and obtain a 2D echocardiogram 01/07/2018 which was essentially unremarkable as well. He continues to exercise, and is active without limitation. His only complaint is he does not have the energy that he used to.  He was recently evaluated underwent endoscopy revealing a large hiatal hernia.  Is being considered for laparoscopic hiatal hernia repair.  He was referred back to me by his gastroenterologist because of an exertional component to his chest pain to rule out an ischemic etiology.  A Myoview stress test showed inferior scar without ischemia clearly different than his last stress test done 2 years ago.  Because of this I elected to proceed with outpatient diagnostic coronary angiography to define his anatomy.   James Braun has severe two-vessel disease with high-grade proximal LAD disease, ostial second diagonal branch disease and severe high-grade tandem lesions in a tortuous dominant RCA with grade 2-3 left-to-right collaterals.  He does have moderate least severe LV dysfunction with an EF in the 30 to 35% range and more prominent inferior wall motion abnormality.  I suspect his LV dysfunction is ischemically mediated and represents hibernating myocardium.  He will need CABG.  He is on aspirin.  T CTS (Dr. Darcey Braun) has been consulted.  The sheath was removed and a TR band was placed on the right wrist to achieve patent hemostasis.  The patient left the  lab in stable condition. James Braun. MD, [place] 11/27/2019 3:11 PM   DG Chest Port 1 View  Result Date: 11/28/2019 CLINICAL DATA:  Post CABG EXAM: PORTABLE CHEST 1 VIEW COMPARISON:  11/28/2019 FINDINGS: Changes of CABG. Support devices are stable. No pneumothorax. Left base atelectasis. Improved aeration in the lungs. No effusions. No acute bony abnormality. IMPRESSION: Support devices are stable.  No pneumothorax. Improved aeration in the lungs.  Left base atelectasis. Electronically Signed   By: James Braun M.D.   On: 11/28/2019 16:37  DG Chest Portable 1 View  Result Date: 11/28/2019 CLINICAL DATA:  Status post CABG today. EXAM: PORTABLE CHEST 1 VIEW COMPARISON:  Yesterday. FINDINGS: Interval post CABG changes. Interval endotracheal tube with its tip 3 cm above the carina. Mediastinal and bilateral chest tubes. No pneumothorax seen. Right jugular Swan-Ganz catheter tip in the main pulmonary artery. Poor depth of inspiration with mild patchy and linear density throughout both lungs with mild-to-moderate central peribronchial thickening. Possible minimal left pleural effusion. Cholecystectomy clips. Unremarkable bones. IMPRESSION: 1. Interval CABG with endotracheal tube tip 3 cm above the carina. 2. Poor depth of inspiration with mild patchy and linear atelectasis throughout both lungs. 3. Possible minimal left pleural effusion. Electronically Signed   By: Claudie Revering M.D.   On: 11/28/2019 15:55   ECHOCARDIOGRAM COMPLETE  Result Date: 11/27/2019    ECHOCARDIOGRAM REPORT   Patient Name:   James Braun Date of Exam: 11/27/2019 Medical Rec #:  [id number]      Height:       67.0 in Accession #:    [id number]     Weight:       155.0 lb Date of Birth:  07-30-1940      BSA:          1.815 m Patient Age:    7 years       BP:           142/72 mmHg Patient Gender: M              HR:           83 bpm. Exam Location:  Inpatient Procedure: 2D Echo Indications:    CAD; Pre-CABG  History:        Patient has prior  history of Echocardiogram examinations, most                 recent 01/07/2018. Risk Factors:Diabetes, Hypertension and                 Dyslipidemia.  Sonographer:    Mikki Santee RDCS (AE) Referring Phys: Tharon Aquas TRIGT IMPRESSIONS  1. Left ventricular ejection fraction, by estimation, is 35 to 40%. The left ventricle has moderately decreased function. The left ventricle demonstrates regional wall motion abnormalities (see scoring diagram/findings for description). Left ventricular  diastolic parameters are indeterminate.  2. Right ventricular systolic function is normal. The right ventricular size is normal.  3. Left atrial size was mild to moderately dilated.  4. The mitral valve is normal in structure. Mild mitral valve regurgitation. No evidence of mitral stenosis.  5. The aortic valve is tricuspid. Aortic valve regurgitation is not visualized. Mild aortic valve sclerosis is present, with no evidence of aortic valve stenosis.  6. The inferior vena cava is normal in size with <50% respiratory variability, suggesting right atrial pressure of 8 mmHg. Conclusion(s)/Recommendation(s): Reduced LV function, most prominent from mid to apical segments of all walls. Suggests multivessel CAD. FINDINGS  Left Ventricle: Left ventricular ejection fraction, by estimation, is 35 to 40%. The left ventricle has moderately decreased function. The left ventricle demonstrates regional wall motion abnormalities. Severe hypokinesis of the left ventricular, mid-apical inferoseptal wall, anterolateral wall, anterior wall, inferolateral wall, inferior wall and anteroseptal wall. The left ventricular internal cavity size was normal in size. There is no left ventricular hypertrophy. Left ventricular diastolic parameters are indeterminate. Right Ventricle: The right ventricular size is normal. No increase in right ventricular wall thickness. Right ventricular systolic function is normal. Left Atrium: Left atrial size  was mild to  moderately dilated. Right Atrium: Right atrial size was not well visualized. Pericardium: There is no evidence of pericardial effusion. Mitral Valve: The mitral valve is normal in structure. Moderate mitral annular calcification. Mild mitral valve regurgitation. No evidence of mitral valve stenosis. Tricuspid Valve: The tricuspid valve is normal in structure. Tricuspid valve regurgitation is trivial. No evidence of tricuspid stenosis. Aortic Valve: The aortic valve is tricuspid. . There is mild thickening and mild calcification of the aortic valve. Aortic valve regurgitation is not visualized. Mild aortic valve sclerosis is present, with no evidence of aortic valve stenosis. There is mild thickening of the aortic valve. There is mild calcification of the aortic valve. Pulmonic Valve: The pulmonic valve was grossly normal. Pulmonic valve regurgitation is mild. No evidence of pulmonic stenosis. Aorta: The aortic root, ascending aorta and aortic arch are all structurally normal, with no evidence of dilitation or obstruction. Venous: The inferior vena cava is normal in size with less than 50% respiratory variability, suggesting right atrial pressure of 8 mmHg. IAS/Shunts: The atrial septum is grossly normal.  LEFT VENTRICLE PLAX 2D LVIDd:         5.20 cm  Diastology LVIDs:         4.20 cm  LV e' lateral:   6.85 cm/s LV PW:         0.80 cm  LV E/e' lateral: 12.2 LV IVS:        0.80 cm LVOT diam:     2.50 cm LV SV:         81 LV SV Index:   44 LVOT Area:     4.91 cm  RIGHT VENTRICLE TAPSE (M-mode): 1.7 cm LEFT ATRIUM           Index       RIGHT ATRIUM          Index LA diam:      3.50 cm 1.93 cm/m  RA Area:     8.18 cm LA Vol (A2C): 51.8 ml 28.55 ml/m RA Volume:   12.90 ml 7.11 ml/m LA Vol (A4C): 62.0 ml 34.17 ml/m  AORTIC VALVE LVOT Vmax:   83.90 cm/s LVOT Vmean:  54.700 cm/s LVOT VTI:    0.164 m  AORTA Ao Root diam: 2.90 cm MITRAL VALVE MV Area (PHT): 5.84 cm     SHUNTS MV Decel Time: 130 msec     Systemic VTI:   0.16 m MV E velocity: 83.90 cm/s   Systemic Diam: 2.50 cm MV A velocity: 127.00 cm/s MV E/A ratio:  0.66 Buford Dresser MD Electronically signed by Buford Dresser MD Signature Date/Time: 11/27/2019/7:16:22 PM    Final    ECHO INTRAOPERATIVE TEE  Result Date: 11/28/2019  *INTRAOPERATIVE TRANSESOPHAGEAL REPORT *  Patient Name:   James Braun Date of Exam: 11/28/2019 Medical Rec #:  [id number]      Height:       67.0 in Accession #:    [id number]     Weight:       149.7 lb Date of Birth:  Mar 07, 1941      BSA:          1.79 m Patient Age:    26 years       BP:           138/82 mmHg Patient Gender: M              HR:           77 bpm. Exam  Location:  Inpatient Transesophogeal exam was perform intraoperatively during surgical procedure. Patient was closely monitored under general anesthesia during the entirety of examination. Indications:     I25.110 Atherosclerotic heart disease of native coronary artery                  with unstable angina pectoris Performing Phys: Adele Barthel MD Diagnosing Phys: Adele Barthel MD Complications: No known complications during this procedure. POST-OP IMPRESSIONS - Left Ventricle: has mildly reduced systolic function, with an ejection fraction of 30%. - Aorta: The aorta appears unchanged from pre-bypass. - Left Atrial Appendage: The left atrial appendage appears unchanged from pre-bypass. - Aortic Valve: The aortic valve appears unchanged from pre-bypass. - Mitral Valve: The mitral valve appears unchanged from pre-bypass. - Tricuspid Valve: There is trace regurgitation. - Interatrial Septum: Leftward bowing of the intratrial septum suggesting increased right atrial pressure. - Pericardium: The pericardium appears unchanged from pre-bypass. PRE-OP FINDINGS  Left Ventricle: The left ventricle has mild-moderately reduced systolic function, with an ejection fraction of 40-45%. The cavity size was normal. There is no increase in left ventricular wall thickness. Right  Ventricle: The right ventricle has normal systolic function. The cavity was mildly enlarged. There is mild increase in right ventricular wall thickness. Left Atrium: Left atrial size was normal in size. The left atrial appendage is well visualized and there is no evidence of thrombus present. Right Atrium: Right atrial size was normal in size. A Chiari network is visualized. Interatrial Septum: No atrial level shunt detected by color flow Doppler. Pericardium: There is no evidence of pericardial effusion. Mitral Valve: The mitral valve is normal in structure. Mitral valve regurgitation is mild by color flow Doppler. The MR jet is centrally-directed. Tricuspid Valve: The tricuspid valve was normal in structure. Tricuspid valve regurgitation was not visualized by color flow Doppler. Aortic Valve: The aortic valve is tricuspid. There is mild thickening and reduced mobility of the non-coronary cusp of the aortic valve. Aortic valve regurgitation was not visualized by color flow Doppler. There is no evidence of aortic valve stenosis. Pulmonic Valve: The pulmonic valve was normal in structure. Pulmonic valve regurgitation is trivial by color flow Doppler. +--------------+-------++ LEFT VENTRICLE        +--------------+-------++ PLAX 2D               +--------------+-------++ LVIDd:        4.46 cm +--------------+-------++ LVIDs:        3.86 cm +--------------+-------++ LV SV:        26 ml   +--------------+-------++ LV SV Index:  14.59   +--------------+-------++                       +--------------+-------++ +-------------+-----------++ AORTIC VALVE             +-------------+-----------++ AV Vmax:     124.00 cm/s +-------------+-----------++ AV Vmean:    93.200 cm/s +-------------+-----------++ AV VTI:      0.301 m     +-------------+-----------++ AV Peak Grad:6.2 mmHg    +-------------+-----------++ AV Mean Grad:4.0 mmHg    +-------------+-----------++  Adele Barthel  MD Electronically signed by Adele Barthel MD Signature Date/Time: 11/28/2019/5:29:15 PM    Final    VAS US DOPPLER PRE CABG  Result Date: 11/27/2019 PREOPERATIVE VASCULAR EVALUATION  Indications:  Pre-CABG. Risk Factors: Hypertension, hyperlipidemia, Diabetes. Performing Technologist: Maudry Mayhew MHA, RDMS, RVT, RDCS  Examination Guidelines: A complete evaluation includes B-mode imaging, spectral Doppler, color Doppler, and power Doppler as needed  of all accessible portions of each vessel. Bilateral testing is considered an integral part of a complete examination. Limited examinations for reoccurring indications may be performed as noted.  Right Carotid Findings: +----------+--------+--------+--------+-----------------------+--------+           PSV cm/sEDV cm/sStenosisDescribe               Comments +----------+--------+--------+--------+-----------------------+--------+ CCA Prox  158     28                                              +----------+--------+--------+--------+-----------------------+--------+ CCA Distal67      18                                              +----------+--------+--------+--------+-----------------------+--------+ ICA Prox  51      16              smooth and heterogenous         +----------+--------+--------+--------+-----------------------+--------+ ICA Distal110     34                                              +----------+--------+--------+--------+-----------------------+--------+ ECA       65      8                                               +----------+--------+--------+--------+-----------------------+--------+ Portions of this table do not appear on this page. +----------+--------+-------+----------------+------------+           PSV cm/sEDV cmsDescribe        Arm Pressure +----------+--------+-------+----------------+------------+ Subclavian113            Multiphasic, WNL              +----------+--------+-------+----------------+------------+ +---------+--------+--+--------+-+---------+ VertebralPSV cm/s23EDV cm/s6Antegrade +---------+--------+--+--------+-+---------+ Left Carotid Findings: +----------+--------+--------+--------+-----------------------+--------+           PSV cm/sEDV cm/sStenosisDescribe               Comments +----------+--------+--------+--------+-----------------------+--------+ CCA Prox  146     32                                              +----------+--------+--------+--------+-----------------------+--------+ CCA Distal78      16                                              +----------+--------+--------+--------+-----------------------+--------+ ICA Prox  74      18                                              +----------+--------+--------+--------+-----------------------+--------+ ICA Distal178     53                                              +----------+--------+--------+--------+-----------------------+--------+  ECA       72      8               smooth and heterogenous         +----------+--------+--------+--------+-----------------------+--------+ +----------+--------+--------+----------------+------------+ SubclavianPSV cm/sEDV cm/sDescribe        Arm Pressure +----------+--------+--------+----------------+------------+           70              Multiphasic, WNL             +----------+--------+--------+----------------+------------+ +---------+--------+---+--------+--+---------+ VertebralPSV cm/s134EDV cm/s27Antegrade +---------+--------+---+--------+--+---------+  ABI Findings: +--------+-----------------+-----+---------+----------------------------------+ Right   Rt Pressure      IndexWaveform Comment                                    (mmHg)                                                            +--------+-----------------+-----+---------+----------------------------------+  Brachial                      triphasicPressure not obtained due to TR                                           band                               +--------+-----------------+-----+---------+----------------------------------+ PTA                           triphasic                                   +--------+-----------------+-----+---------+----------------------------------+ DP                            triphasic                                   +--------+-----------------+-----+---------+----------------------------------+ +--------+------------------+-----+---------+-------+ Left    Lt Pressure (mmHg)IndexWaveform Comment +--------+------------------+-----+---------+-------+ [id number]                    triphasic        +--------+------------------+-----+---------+-------+ PTA                            triphasic        +--------+------------------+-----+---------+-------+ DP                             triphasic        +--------+------------------+-----+---------+-------+  Right Doppler Findings: +-----------+--------+-----+---------+------------------------------------+ Site       PressureIndexDoppler  Comments                             +-----------+--------+-----+---------+------------------------------------+ Brachial  triphasicPressure not obtained due to TR band +-----------+--------+-----+---------+------------------------------------+ Radial                  triphasic                                     +-----------+--------+-----+---------+------------------------------------+ Ulnar                   triphasic                                     +-----------+--------+-----+---------+------------------------------------+ Palmar Arch                      Not evaluated due to TR band         +-----------+--------+-----+---------+------------------------------------+  Left Doppler Findings:  +-----------+--------+-----+---------+-----------------------------------------+ Site       PressureIndexDoppler  Comments                                  +-----------+--------+-----+---------+-----------------------------------------+ Brachial   138          triphasic                                          +-----------+--------+-----+---------+-----------------------------------------+ Radial                  triphasic                                          +-----------+--------+-----+---------+-----------------------------------------+ Ulnar                   triphasic                                          +-----------+--------+-----+---------+-----------------------------------------+ Palmar Arch                      Signal is unaffected with radial                                           compression, obliterates with ulnar                                        compression.                              +-----------+--------+-----+---------+-----------------------------------------+  Summary: Right Carotid: Velocities in the right ICA are consistent with a 1-39% stenosis. Left Carotid: Velocities in the left ICA are consistent with a 1-39% stenosis. Vertebrals:  Bilateral vertebral arteries demonstrate antegrade flow. Subclavians: Normal flow hemodynamics were seen in bilateral subclavian              arteries. Bilateral lower extremities: bilateral pedal waveforms are within normal  limits. Right Upper Extremity: No significant arterial obstruction detected in the right upper extremity.  Left Upper Extremity: No significant arterial obstruction detected in the left upper extremity.Palmar arch Allen's test: Doppler waveforms remain within normal limits with left radial compression. Doppler waveform obliterate with left ulnar compression.    Preliminary     Cardiac Studies:  ECG:    Telemetry:  NSR   Echo: EF 35-40%   Medications:   . acetaminophen   1,000 mg Oral Q6H   Or  . acetaminophen (TYLENOL) oral liquid 160 mg/5 mL  1,000 mg Per Tube Q6H  . aspirin EC  325 mg Oral Daily   Or  . aspirin  324 mg Per Tube Daily  . bisacodyl  10 mg Oral Daily   Or  . bisacodyl  10 mg Rectal Daily  . Chlorhexidine Gluconate Cloth  6 each Topical Daily  . docusate sodium  200 mg Oral Daily  . finasteride  5 mg Oral QPM  . mouth rinse  15 mL Mouth Rinse BID  . metoprolol tartrate  12.5 mg Oral BID   Or  . metoprolol tartrate  12.5 mg Per Tube BID  . [START ON 11/30/2019] pantoprazole  40 mg Oral Daily  . sodium chloride flush  10 mL Intravenous Q12H  . sodium chloride flush  3 mL Intravenous Q12H  . timolol  1 drop Both Eyes BID     . sodium chloride 20 mL/hr at 11/29/19 0700  . sodium chloride    . sodium chloride 10 mL/hr at 11/29/19 0700  . albumin human Stopped (11/28/19 2249)  . cefUROXime (ZINACEF)  IV Stopped (11/28/19 2007)  . dexmedetomidine (PRECEDEX) IV infusion Stopped (11/29/19 0215)  . epinephrine 2 mcg/min (11/29/19 0700)  . insulin 0.8 mL/hr at 11/29/19 0700  . lactated ringers    . lactated ringers    . milrinone 0.3 mcg/kg/min (11/29/19 0700)  . nitroGLYCERIN    . norepinephrine (LEVOPHED) Adult infusion Stopped (11/28/19 1830)  . phenylephrine (NEO-SYNEPHRINE) Adult infusion      Assessment/Plan:   1. CABG:  Rhythm and hemodynamics stable  F/U will be with Dr [name]  CABG 5/13 LIMA to LAD SVG to D1 SVG to PDA Will need to add ACE/ARB/Entresto prior to d/c for ischemic DCM  Continue beta blocker diurese when off inotropes  Jenkins Rouge 11/29/2019, 7:40 AM

## 2019-11-29 NOTE — Progress Notes (Signed)
TCTS BRIEF SICU PROGRESS NOTE  1 Day Post-Op  S/P Procedure(s) (LRB): CORONARY ARTERY BYPASS GRAFTING (CABG) x3, using left internal mammary artery and Saphenous vein harvested endoscopically (N/A) TRANSESOPHAGEAL ECHOCARDIOGRAM (TEE) (N/A)   Stable day NSR w/ stable BP Breathing comfortably w/ O2 sats 94% on 2 L/min UOP adequate Labs okay  Plan: Continue current plan  Rexene Alberts, MD 11/29/2019 8:38 PM

## 2019-11-29 NOTE — Progress Notes (Signed)
Pt more alert and calm. Extubation process explained again. Nods approval of understanding. Will attempt wean to extubate with low dose Precedex on at 0.2.

## 2019-11-29 NOTE — Op Note (Signed)
NAME: James Braun, James Braun MEDICAL RECORD [id number] ACCOUNT [id number] DATE OF BIRTH:Apr 05, 1941 FACILITY: [facility] LOCATION: [facility] PHYSICIAN:Ulonda Klosowski VAN TRIGT III, MD  OPERATIVE REPORT  DATE OF PROCEDURE:  11/28/2019  OPERATION: 1.  Coronary artery bypass grafting x3 (left internal mammary artery to left anterior descending, saphenous vein graft to diagonal, saphenous vein graft to posterolateral branch of the right coronary). 2.  Endoscopic harvest of right leg greater saphenous vein.  SURGEON:  [name], MD  ASSISTANT:  Jadene Pierini, PA-C  ANESTHESIA:  General.  PREOPERATIVE DIAGNOSES:  Severe multivessel coronary artery disease, moderate left ventricular dysfunction, recent inferior wall myocardial infarction.  POSTOPERATIVE DIAGNOSES:  Severe multivessel coronary artery disease, moderate ventricular dysfunction, recent inferior wall myocardial infarction.  PROCEDURE:  The patient was examined in the preoperative holding area where informed consent was documented and final issues and questions were addressed with the patient.  He had presented the day previously with recent episodes of significant chest  pain and cardiac catheterization had documented severe 3-vessel coronary artery disease with total occlusion of the dominant right coronary, which appeared to be recent.  His cardiologist recommended coronary artery bypass grafting as his best long-term  therapy and I agreed.  The 79 year old male was still very functional and overall in fairly good health.  I had previously reviewed the details of the surgery extensively with the patient and his wife and we had discussed the risks of stroke, bleeding,  MI, organ failure, infection, death.  DESCRIPTION OF PROCEDURE:  The patient was brought to the operating room and placed supine on the operating table where general anesthesia was induced.  He remained stable.  A transesophageal echo probe was placed by the anesthesia team.  The  patient was  prepped and draped as a sterile field.  A proper time-out was performed.  A sternal incision was made as the saphenous vein was harvested endoscopically from the right leg.  The left internal mammary artery was harvested as a pedicle graft from its  origin at the subclavian vessels.  It was a small 1.4 mm vessel, but with good flow.  The sternal retractor was placed and the pericardium was opened and suspended.  Pursestrings were placed in the ascending aorta and right atrium and after the vein had  been harvested, the patient was heparinized and the patient was cannulated and placed on bypass.  The coronaries were identified for grafting.  The large posterolateral branch of the right coronary, the large diagonal, and the LAD was found to be  adequate targets.  Cardioplegia cannulas were placed for both antegrade and retrograde cold blood cardioplegia, and the patient was cooled to 32 degrees.  The aortic crossclamp was applied, and a liter of cold blood cardioplegia was delivered in split  doses between the antegrade aortic and retrograde coronary sinus catheters.  There was good cardioplegic arrest and supple temperature dropped less than 12 degrees.  Cardioplegia was delivered every 20 minutes.  The distal coronary anastomoses were performed.  The 1st distal anastomosis was the posterolateral branch of right coronary.  This was a larger 1.6 mm vessel, totally occluded proximally.  A reverse saphenous vein of good quality was sewn end-to-side  with running 7-0 Prolene with good flow through the graft.  Cardioplegia was redosed.  The 2nd distal anastomosis was the 2nd diagonal.  The ostium had an 80-90% stenosis.  It was a 1.4 mm vessel.  Reverse saphenous vein was sewn end-to-side with running 7-0 Prolene with good flow through the graft.  The 3rd distal anastomosis was the distal LAD.  This had a proximal 80-90% stenosis.  It was a thin, fragile vessel, 1.5 mm in diameter.  The left IMA  pedicle was brought through an opening in the left lateral pericardium and brought down onto the LAD  and sewn end-to-side with running 8-0 Prolene.  There was good flow through the anastomosis after briefly releasing the pedicle bulldog and the mammary artery.  The bulldog was reapplied.  The 2 proximal vein anastomoses were performed while the clamp was still in place.  The aorta was somewhat soft and fragile.  The proximal vein graft anastomosis of the diagonal vein was performed with a running 6-0 Prolene.  The proximal anastomosis of  the RCA vein graft was completed with a running 6-0 Prolene.  The patient was given a dose of retrograde warm blood cardioplegia to eliminate any air from the left side of the heart as the final proximal anastomosis was tied.  The crossclamp was then  removed.  The vein grafts were de-aired and opened.  Each had good flow.  Hemostasis was documented at the distal anastomoses.  The patient's heart started with spontaneous rhythm and the patient was rewarmed.  The proximal anastomosis of the vein graft to the diagonal had several small areas of bleeding above the area of the anastomosis.  It was felt that these might have been vaso vasorum, but despite multiple suture with ligatures of 7-0 Prolene, there was  still bleeding, which was not acceptable.  For that reason, the proximal anastomosis was redone with a partial occluding clamp on the ascending aorta.  After the anastomosis was done, the anastomosis was hemostatic.  It did require a couple additional  interrupted sutures with mini pledget reinforcement.  The aortic medial wall also had evidence of bleeding from an area close to the diagonal proximal anastomosis and this was repaired with interrupted pledgeted 4-0 Prolene sutures.  After the patient was rewarmed and temporary pacing wires were applied, the lungs reexpanded.  Ventilator was resumed.  The patient was then weaned from cardiopulmonary bypass without  difficulty.  Cardiac output and hemodynamics were stable on low-dose  milrinone and epinephrine.  Echo showed some RV dysfunction, which over time showed improvement.  The patient was given protamine without adverse reaction.  The patient still had coagulopathy and was given some blood products including platelets and FFP.   After observing the patient for a significant period of time, the cardiac function appeared to be very satisfactory and the pericardium was partially closed.  Anterior mediastinum and bilateral pleural tubes were placed.  The sternum was closed with  wire.  The 1st attempt at closing the sternum resulted in low blood pressure and the wires were then relaxed and the sternum left apart for another 10 minutes and then after sternal reclosure the 2nd time, the patient tolerated this very well.  The  patient then remained hemodynamically stable for the rest of the closure including the closure of the pectoralis fascia and subcutaneous layer and the skin.  The leg incision had been closed in a standard fashion.  The patient was then prepared for transport back to the ICU in stable condition.  Total cardiopulmonary bypass time was 180 minutes.  CN/NUANCE  D:11/28/2019 T:11/29/2019 JOB:[id number]

## 2019-11-29 NOTE — Progress Notes (Signed)
K+= 3.6 and creat= 1.13  w/ urine o/p > 30cc/hr; TCTS KCL protocol initiated with 10 mEq KCL in 50cc IV x 3, each over one hour.

## 2019-11-29 NOTE — Progress Notes (Signed)
Precedex drip turned off for short periods of apnea during vent weaning.

## 2019-11-29 NOTE — Progress Notes (Addendum)
[address]       [place] [address]             [phone]        CARDIOTHORACIC SURGERY PROGRESS NOTE   R1 Day Post-Op Procedure(s) (LRB): CORONARY ARTERY BYPASS GRAFTING (CABG) x3, using left internal mammary artery and Saphenous vein harvested endoscopically (N/A) TRANSESOPHAGEAL ECHOCARDIOGRAM (TEE) (N/A)  Subjective: Looks good.  Mild soreness in chest  Objective: Vital signs: BP Readings from Last 1 Encounters:  11/29/19 110/73   Pulse Readings from Last 1 Encounters:  11/29/19 93   Resp Readings from Last 1 Encounters:  11/29/19 18   Temp Readings from Last 1 Encounters:  11/29/19 99.9 F (37.7 C)    Hemodynamics: PAP: (31-46)/(15-27) 36/16 CVP:  [12 mmHg-24 mmHg] 13 mmHg CO:  [3.4 L/min-4.4 L/min] 4.2 L/min CI:  [1.9 L/min/m2-2.5 L/min/m2] 2.4 L/min/m2  Physical Exam:  Rhythm:   sinus  Breath sounds: clear  Heart sounds:  RRR  Incisions:  Dressings dry, intact  Abdomen:  Soft, non-distended, non-tender  Extremities:  Warm, well-perfused  Chest tubes:  low volume thin serosanguinous output, no air leak    Intake/Output from previous day: 05/14 0701 - 05/15 0700 In: 7463.3 [P.O.:240; I.V.:4062.1; Blood:1248; IV Piggyback:1913.1] Out: [id number] [Urine:2920; Blood:1244; Chest Tube:550] Intake/Output this shift: Total I/O In: 225.7 [I.V.:125.7; IV Piggyback:100] Out: 175 [Urine:95; Chest Tube:80]  Lab Results:  CBC: Recent Labs    11/28/19 2244 11/28/19 2244 11/29/19 0251 11/29/19 0558  WBC 10.3  --   --  10.4  HGB 9.5*   < > 9.5* 9.2*  HCT 30.0*   < > 28.0* 28.8*  PLT 198  --   --  194   < > = values in this interval not displayed.    BMET:  Recent Labs    11/28/19 2244 11/28/19 2244 11/29/19 0251 11/29/19 0558  NA 136   < > 141 139  K 3.6   < > 3.6 3.9  CL 106  --   --  108  CO2 23  --   --  22  GLUCOSE 123*  --   --  127*  BUN 15  --   --  18  CREATININE 1.13  --   --  1.29*  CALCIUM 7.1*  --   --  7.6*    < > = values in this interval not displayed.     PT/INR:   Recent Labs    11/28/19 1601  LABPROT 16.9*  INR 1.4*    CBG (last 3)  Recent Labs    11/29/19 0653 11/29/19 0809 11/29/19 0942  GLUCAP 119* 108* 108*    ABG    Component Value Date/Time   PHART 7.476 (H) 11/29/2019 0251   PCO2ART 27.5 (L) 11/29/2019 0251   PO2ART 68 (L) 11/29/2019 0251   HCO3 20.1 11/29/2019 0251   TCO2 21 (L) 11/29/2019 0251   ACIDBASEDEF 2.0 11/29/2019 0251   O2SAT 94.0 11/29/2019 0251    CXR: PORTABLE CHEST 1 VIEW  COMPARISON:  11/28/2019 and earlier studies.  FINDINGS: Since the previous exam, the endotracheal tube has been removed. The right internal jugular Swan-Ganz catheter, mediastinal tube and right and left chest tubes are stable.  Cardiac silhouette is normal in size.  No mediastinal widening.  There is left greater than right lung base opacity consistent with atelectasis, on the left associated with a small effusion. There are prominent bronchovascular markings, but no evidence of pulmonary  edema.  No pneumothorax.  IMPRESSION: 1. No acute findings or evidence of an operative complication. 2. Lung base atelectasis, greater on the left. Small left pleural effusion. 3. Remaining support apparatus is stable and well positioned.   Electronically Signed   By: Lajean Manes M.D.   On: 11/29/2019 09:58   EKG: NSR w/out acute ischemic changes    Assessment/Plan: S/P Procedure(s) (LRB): CORONARY ARTERY BYPASS GRAFTING (CABG) x3, using left internal mammary artery and Saphenous vein harvested endoscopically (N/A) TRANSESOPHAGEAL ECHOCARDIOGRAM (TEE) (N/A)  Doing well POD1 Maintaining NSR w/ stable hemodynamics on low dose Epi and milrinone Breathing comfortably w/ O2 sats 95-99% on 2 L/min, CXR clear S/P acute MI preop w/ acute on chronic systolic CHF and expected post-op volume excess Expected post op acute blood loss anemia, mild Type II diabetes  mellitus, excellent glycemic control   Wean Epi as tolerated   Wean milrinone slowly  Mobilize  Diuresis  Transition off insulin drip   Rexene Alberts, MD 11/29/2019 10:45 AM

## 2019-11-30 ENCOUNTER — Inpatient Hospital Stay (HOSPITAL_COMMUNITY): Payer: PPO

## 2019-11-30 LAB — BASIC METABOLIC PANEL
Anion gap: 7 (ref 5–15)
BUN: 22 mg/dL (ref 8–23)
CO2: 22 mmol/L (ref 22–32)
Calcium: 7.6 mg/dL — ABNORMAL LOW (ref 8.9–10.3)
Chloride: 107 mmol/L (ref 98–111)
Creatinine, Ser: 1.39 mg/dL — ABNORMAL HIGH (ref 0.61–1.24)
GFR calc Af Amer: 55 mL/min — ABNORMAL LOW (ref >60)
GFR calc non Af Amer: 48 mL/min — ABNORMAL LOW (ref >60)
Glucose, Bld: 119 mg/dL — ABNORMAL HIGH (ref 70–99)
Potassium: 3.8 mmol/L (ref 3.5–5.1)
Sodium: 136 mmol/L (ref 135–145)

## 2019-11-30 LAB — GLUCOSE, CAPILLARY
Glucose-Capillary: 114 mg/dL — ABNORMAL HIGH (ref 70–99)
Glucose-Capillary: 157 mg/dL — ABNORMAL HIGH (ref 70–99)
Glucose-Capillary: 128 mg/dL — ABNORMAL HIGH (ref 70–99)
Glucose-Capillary: 146 mg/dL — ABNORMAL HIGH (ref 70–99)

## 2019-11-30 LAB — CBC
HCT: 29.5 % — ABNORMAL LOW (ref 39.0–52.0)
Hemoglobin: 9.2 g/dL — ABNORMAL LOW (ref 13.0–17.0)
MCH: 25.6 pg — ABNORMAL LOW (ref 26.0–34.0)
MCHC: 31.2 g/dL (ref 30.0–36.0)
MCV: 81.9 fL (ref 80.0–100.0)
Platelets: 183 10*3/uL (ref 150–400)
RBC: 3.6 MIL/uL — ABNORMAL LOW (ref 4.22–5.81)
RDW: 22.8 % — ABNORMAL HIGH (ref 11.5–15.5)
WBC: 9.2 10*3/uL (ref 4.0–10.5)
nRBC: 0 % (ref 0.0–0.2)

## 2019-11-30 LAB — COOXEMETRY PANEL
Carboxyhemoglobin: 1.1 % (ref 0.5–1.5)
Methemoglobin: 0.7 % (ref 0.0–1.5)
O2 Saturation: 57.5 %
Total hemoglobin: 9.3 g/dL — ABNORMAL LOW (ref 12.0–16.0)

## 2019-11-30 MED ORDER — AMIODARONE HCL IN DEXTROSE 360-4.14 MG/200ML-% IV SOLN
60.0000 mg/h | INTRAVENOUS | Status: AC
  Administered 2019-11-30: 60 mg/h via INTRAVENOUS
  Filled 2019-11-30 (×2): qty 200

## 2019-11-30 MED ORDER — AMIODARONE HCL IN DEXTROSE 360-4.14 MG/200ML-% IV SOLN
30.0000 mg/h | INTRAVENOUS | Status: AC
  Administered 2019-12-01: 30 mg/h via INTRAVENOUS
  Filled 2019-11-30: qty 200

## 2019-11-30 MED ORDER — POTASSIUM CHLORIDE 10 MEQ/50ML IV SOLN
10.0000 meq | Freq: Once | INTRAVENOUS | Status: AC
  Administered 2019-11-30: 10 meq via INTRAVENOUS

## 2019-11-30 MED ORDER — AMIODARONE LOAD VIA INFUSION
150.0000 mg | Freq: Once | INTRAVENOUS | Status: AC
  Administered 2019-11-30: 150 mg via INTRAVENOUS
  Filled 2019-11-30: qty 83.34

## 2019-11-30 MED ORDER — ~~LOC~~ CARDIAC SURGERY, PATIENT & FAMILY EDUCATION
Freq: Once | Status: AC
  Administered 2019-11-30: 1

## 2019-11-30 MED ORDER — POTASSIUM CHLORIDE 10 MEQ/50ML IV SOLN
10.0000 meq | INTRAVENOUS | Status: AC
  Administered 2019-11-30 (×2): 10 meq via INTRAVENOUS
  Filled 2019-11-30 (×3): qty 50

## 2019-11-30 NOTE — Progress Notes (Signed)
  Amiodarone Drug - Drug Interaction Consult Note  Recommendations: None, monitor for now  Amiodarone is metabolized by the cytochrome P450 system and therefore has the potential to cause many drug interactions. Amiodarone has an average plasma half-life of 50 days (range 20 to 100 days).   There is potential for drug interactions to occur several weeks or months after stopping treatment and the onset of drug interactions may be slow after initiating amiodarone.   []  Statins: Increased risk of myopathy. Simvastatin- restrict dose to 20mg  daily. Other statins: counsel patients to report any muscle pain or weakness immediately.  []  Anticoagulants: Amiodarone can increase anticoagulant effect. Consider warfarin dose reduction. Patients should be monitored closely and the dose of anticoagulant altered accordingly, remembering that amiodarone levels take several weeks to stabilize.  []  Antiepileptics: Amiodarone can increase plasma concentration of phenytoin, the dose should be reduced. Note that small changes in phenytoin dose can result in large changes in levels. Monitor patient and counsel on signs of toxicity.  [x]  Beta blockers: increased risk of bradycardia, AV block and myocardial depression. Sotalol - avoid concomitant use.  []   Calcium channel blockers (diltiazem and verapamil): increased risk of bradycardia, AV block and myocardial depression.  []   Cyclosporine: Amiodarone increases levels of cyclosporine. Reduced dose of cyclosporine is recommended.  []  Digoxin dose should be halved when amiodarone is started.  [x]  Diuretics: increased risk of cardiotoxicity if hypokalemia occurs.  []  Oral hypoglycemic agents (glyburide, glipizide, glimepiride): increased risk of hypoglycemia. Patient's glucose levels should be monitored closely when initiating amiodarone therapy.   []  Drugs that prolong the QT interval:  Torsades de pointes risk may be increased with concurrent use - avoid if  possible.  Monitor QTc, also keep magnesium/potassium WNL if concurrent therapy can't be avoided. Marland Kitchen Antibiotics: e.g. fluoroquinolones, erythromycin. . Antiarrhythmics: e.g. quinidine, procainamide, disopyramide, sotalol. . Antipsychotics: e.g. phenothiazines, haloperidol.  . Lithium, tricyclic antidepressants, and methadone. Thank You,  Pat Patrick  11/30/2019 1:50 PM

## 2019-11-30 NOTE — Progress Notes (Signed)
[address]       [place] [address]             [phone]        CARDIOTHORACIC SURGERY PROGRESS NOTE   R2 Days Post-Op Procedure(s) (LRB): CORONARY ARTERY BYPASS GRAFTING (CABG) x3, using left internal mammary artery and Saphenous vein harvested endoscopically (N/A) TRANSESOPHAGEAL ECHOCARDIOGRAM (TEE) (N/A)  Subjective: Feels okay but not great.  Denies SOB.  Ate some breakfast.  Mild soreness.  Started going in and out of Afib  Objective: Vital signs: BP Readings from Last 1 Encounters:  11/30/19 134/83   Pulse Readings from Last 1 Encounters:  11/30/19 95   Resp Readings from Last 1 Encounters:  11/30/19 (!) 27   Temp Readings from Last 1 Encounters:  11/30/19 99 F (37.2 C) (Oral)    Hemodynamics:   Mixed venous co-ox 57%   Physical Exam:  Rhythm:   Afib w/ HR 100-110  Breath sounds: Diminished at bases  Heart sounds:  irregular  Incisions:  Dressing dry, intact  Abdomen:  Soft, non-distended, non-tender  Extremities:  Warm, well-perfused  Chest tubes:  low volume thin serosanguinous output, no air leak    Intake/Output from previous day: 05/15 0701 - 05/16 0700 In: 873.5 [P.O.:60; I.V.:450.2; IV Piggyback:363.2] Out: 1100 [Urine:670; Chest Tube:430] Intake/Output this shift: No intake/output data recorded.  Lab Results:  CBC: Recent Labs    11/29/19 1638 11/30/19 0338  WBC 8.6 9.2  HGB 9.3* 9.2*  HCT 30.2* 29.5*  PLT 177 183    BMET:  Recent Labs    11/29/19 1638 11/30/19 0338  NA 136 136  K 4.6 3.8  CL 108 107  CO2 22 22  GLUCOSE 147* 119*  BUN 19 22  CREATININE 1.21 1.39*  CALCIUM 7.5* 7.6*     PT/INR:   Recent Labs    11/28/19 1601  LABPROT 16.9*  INR 1.4*    CBG (last 3)  Recent Labs    11/29/19 2356 11/30/19 0335 11/30/19 0720  GLUCAP 116* 114* 157*    ABG    Component Value Date/Time   PHART 7.476 (H) 11/29/2019 0251   PCO2ART 27.5 (L) 11/29/2019 0251   PO2ART 68 (L)  11/29/2019 0251   HCO3 20.1 11/29/2019 0251   TCO2 21 (L) 11/29/2019 0251   ACIDBASEDEF 2.0 11/29/2019 0251   O2SAT 57.5 11/30/2019 0338    CXR: PORTABLE CHEST 1 VIEW  COMPARISON:  11/29/2019  FINDINGS: Postop changes from CABG procedure bilateral chest tubes in place. No pneumothorax visualized. Interval removal of pulmonary arterial catheter. Mild cardiac enlargement. Small left pleural effusion. Atelectasis is noted in the left base.  IMPRESSION: 1. No pneumothorax status post CABG procedure. 2. Small left pleural effusion and left base atelectasis.   Electronically Signed   By: Kerby Moors M.D.   On: 11/30/2019 09:45  Assessment/Plan: S/P Procedure(s) (LRB): CORONARY ARTERY BYPASS GRAFTING (CABG) x3, using left internal mammary artery and Saphenous vein harvested endoscopically (N/A) TRANSESOPHAGEAL ECHOCARDIOGRAM (TEE) (N/A)  Stable POD1 Post-op Afib w/ mildly elevated rate, BP stable Breathing comfortably w/ O2 sats 96% on 2 L/min S/P acute MI w/ decreased EF, co-ox down slightly 57% on milrinone 0.25 Expected post op volume excess, weight reportedly 9 kg > preop, UOP adequate Expected post op atelectasis, mild, L>R Expected post op acute blood loss anemia, mild, stable Type II diabetes mellitus, excellent glycemic control   Start amiodarone  Continue low dose milrinone for now  Diuresis  Mobilize  D/C tubes   Rexene Alberts, MD 11/30/2019 11:26 AM

## 2019-11-30 NOTE — Progress Notes (Signed)
TCTS BRIEF SICU PROGRESS NOTE  2 Days Post-Op  S/P Procedure(s) (LRB): CORONARY ARTERY BYPASS GRAFTING (CABG) x3, using left internal mammary artery and Saphenous vein harvested endoscopically (N/A) TRANSESOPHAGEAL ECHOCARDIOGRAM (TEE) (N/A)   Stable day NSR w/ stable BP Breathing comfortably w/ O2 sats 95% UOP adequate  Plan: Continue current plan  Rexene Alberts, MD 11/30/2019 6:03 PM

## 2019-11-30 NOTE — Progress Notes (Addendum)
Patient went into a fib with rates in the 80s-90s. BP 109/75 Patient asymptomatic. Scheduled metoprolol 12.5 mg was given at 2230.   0230: Patient converted to NSR, Hrs 80s

## 2019-12-01 ENCOUNTER — Inpatient Hospital Stay (HOSPITAL_COMMUNITY): Payer: PPO

## 2019-12-01 ENCOUNTER — Inpatient Hospital Stay: Payer: Self-pay

## 2019-12-01 ENCOUNTER — Telehealth: Payer: Self-pay | Admitting: Internal Medicine

## 2019-12-01 ENCOUNTER — Telehealth: Payer: Self-pay | Admitting: Podiatry

## 2019-12-01 LAB — TYPE AND SCREEN
ABO/RH(D): O POS
Antibody Screen: NEGATIVE
Unit division: 0
Unit division: 0
Unit division: 0
Unit division: 0
Unit division: 0
Unit division: 0

## 2019-12-01 LAB — BPAM RBC
Blood Product Expiration Date: 202106142359
Blood Product Expiration Date: 202106142359
Blood Product Expiration Date: 202106142359
Blood Product Expiration Date: 202106142359
Blood Product Expiration Date: 202106152359
Blood Product Expiration Date: 202106152359
ISSUE DATE / TIME: 202105140902
ISSUE DATE / TIME: 202105140902
ISSUE DATE / TIME: 202105141056
ISSUE DATE / TIME: 202105141056
Unit Type and Rh: 5100
Unit Type and Rh: 5100
Unit Type and Rh: 5100
Unit Type and Rh: 5100
Unit Type and Rh: 5100
Unit Type and Rh: 5100

## 2019-12-01 LAB — CBC
HCT: 29.8 % — ABNORMAL LOW (ref 39.0–52.0)
Hemoglobin: 9.3 g/dL — ABNORMAL LOW (ref 13.0–17.0)
MCH: 25.6 pg — ABNORMAL LOW (ref 26.0–34.0)
MCHC: 31.2 g/dL (ref 30.0–36.0)
MCV: 82.1 fL (ref 80.0–100.0)
Platelets: 219 10*3/uL (ref 150–400)
RBC: 3.63 MIL/uL — ABNORMAL LOW (ref 4.22–5.81)
RDW: 22.9 % — ABNORMAL HIGH (ref 11.5–15.5)
WBC: 8.5 10*3/uL (ref 4.0–10.5)
nRBC: 0 % (ref 0.0–0.2)

## 2019-12-01 LAB — BASIC METABOLIC PANEL
Anion gap: 7 (ref 5–15)
BUN: 27 mg/dL — ABNORMAL HIGH (ref 8–23)
CO2: 22 mmol/L (ref 22–32)
Calcium: 7.6 mg/dL — ABNORMAL LOW (ref 8.9–10.3)
Chloride: 105 mmol/L (ref 98–111)
Creatinine, Ser: 0.92 mg/dL (ref 0.61–1.24)
GFR calc Af Amer: >60 mL/min (ref >60)
GFR calc non Af Amer: >60 mL/min (ref >60)
Glucose, Bld: 138 mg/dL — ABNORMAL HIGH (ref 70–99)
Potassium: 4.9 mmol/L (ref 3.5–5.1)
Sodium: 134 mmol/L — ABNORMAL LOW (ref 135–145)

## 2019-12-01 LAB — COOXEMETRY PANEL
Carboxyhemoglobin: 1.3 % (ref 0.5–1.5)
Methemoglobin: 1.1 % (ref 0.0–1.5)
O2 Saturation: 53.6 %
Total hemoglobin: 12.4 g/dL (ref 12.0–16.0)

## 2019-12-01 LAB — MAGNESIUM: Magnesium: 2 mg/dL (ref 1.7–2.4)

## 2019-12-01 LAB — SURGICAL PATHOLOGY

## 2019-12-01 MED ORDER — SORBITOL 70 % SOLN
30.0000 mL | Freq: Once | Status: AC
  Administered 2019-12-01: 30 mL via ORAL
  Filled 2019-12-01: qty 30

## 2019-12-01 MED ORDER — SODIUM CHLORIDE 0.9% FLUSH
10.0000 mL | Freq: Two times a day (BID) | INTRAVENOUS | Status: DC
  Administered 2019-12-01: 10 mL
  Administered 2019-12-01: 20 mL
  Administered 2019-12-02 – 2019-12-05 (×5): 10 mL

## 2019-12-01 MED ORDER — METOLAZONE 5 MG PO TABS
5.0000 mg | ORAL_TABLET | Freq: Once | ORAL | Status: AC
  Administered 2019-12-01: 5 mg via ORAL
  Filled 2019-12-01: qty 1

## 2019-12-01 MED ORDER — FENTANYL CITRATE (PF) 100 MCG/2ML IJ SOLN: 25.0000 ug | INTRAMUSCULAR | Status: DC | PRN

## 2019-12-01 MED ORDER — AMIODARONE HCL 200 MG PO TABS
200.0000 mg | ORAL_TABLET | Freq: Two times a day (BID) | ORAL | Status: DC
  Administered 2019-12-01 – 2019-12-03 (×5): 200 mg via ORAL
  Filled 2019-12-01 (×5): qty 1

## 2019-12-01 MED ORDER — FUROSEMIDE 10 MG/ML IJ SOLN
20.0000 mg | Freq: Two times a day (BID) | INTRAMUSCULAR | Status: AC
  Administered 2019-12-01 – 2019-12-02 (×3): 20 mg via INTRAVENOUS
  Filled 2019-12-01 (×3): qty 2

## 2019-12-01 MED ORDER — SODIUM CHLORIDE 0.9% FLUSH: 10.0000 mL | INTRAVENOUS | Status: DC | PRN

## 2019-12-01 MED ORDER — MILRINONE LACTATE IN DEXTROSE 20-5 MG/100ML-% IV SOLN
0.2500 ug/kg/min | INTRAVENOUS | Status: DC
  Administered 2019-12-01 – 2019-12-02 (×3): 0.25 ug/kg/min via INTRAVENOUS
  Filled 2019-12-01 (×3): qty 100

## 2019-12-01 MED ORDER — OXYCODONE HCL 5 MG PO TABS: 5.0000 mg | ORAL_TABLET | ORAL | Status: DC | PRN

## 2019-12-01 NOTE — Addendum Note (Signed)
Addendum  created 12/01/19 1434 by Sammie Bench, CRNA   Order list changed

## 2019-12-01 NOTE — Progress Notes (Signed)
  Chronic Care Management   Outreach Note  12/01/2019 Name: James Braun MRN: [id number] DOB: 03-26-1941  Referred by: [name], MD Reason for referral : No chief complaint on file.   An unsuccessful telephone outreach was attempted today. The patient was referred to the pharmacist for assistance with care management and care coordination.   This note is not being shared with the patient for the following reason: To respect privacy (The patient or proxy has requested that the information not be shared).  Follow Up Plan:   Earney Hamburg Upstream Scheduler

## 2019-12-01 NOTE — Progress Notes (Signed)
      [address]       [place] [address]             [phone]      POD # 3 CABG  BP 119/69   Pulse 85   Temp 98 F (36.7 C)   Resp 18   Ht 5\' 7"  (1.702 m)   Wt 76.3 kg   SpO2 93%   BMI 26.35 kg/m   Intake/Output Summary (Last 24 hours) at 12/01/2019 1813 Last data filed at 12/01/2019 1600 Gross per 24 hour  Intake 1109.23 ml  Output 1140 ml  Net -30.77 ml   In SR on amiodarone Milrinone 0.25  Remo Lipps C. Roxan Hockey, MD [facility] [phone]

## 2019-12-01 NOTE — Progress Notes (Signed)
CARDIAC REHAB PHASE I   PRE:  Rate/Rhythm: 85 SR    BP: sitting 97/64    SaO2: 95 RA  MODE:  Ambulation: 370 ft   POST:  Rate/Rhythm: 94 SR with PACs    BP: sitting 117/81     SaO2: 96 RA  Pt stood to urinate on my arrival. Used RW in hall. Fairly steady, fatigue with distance. Mentions that RW is more work than Probation officer. Return to recliner. VSS. Encouraged pt to walk again later and use IS. Progressing well. [id number]   [place], ACSM 12/01/2019 1:51 PM

## 2019-12-01 NOTE — Progress Notes (Signed)
Peripherally Inserted Central Catheter Placement  The IV Nurse has discussed with the patient and/or persons authorized to consent for the patient, the purpose of this procedure and the potential benefits and risks involved with this procedure.  The benefits include less needle sticks, lab draws from the catheter, and the patient may be discharged home with the catheter. Risks include, but not limited to, infection, bleeding, blood clot (thrombus formation), and puncture of an artery; nerve damage and irregular heartbeat and possibility to perform a PICC exchange if needed/ordered by physician.  Alternatives to this procedure were also discussed.  Bard Power PICC patient education guide, fact sheet on infection prevention and patient information card has been provided to patient /or left at bedside.    PICC Placement Documentation  PICC Double Lumen [id number] PICC Right Basilic 43 cm 2 cm (Active)  Indication for Insertion or Continuance of Line Prolonged intravenous therapies;Vasoactive infusions 12/01/19 1525  Exposed Catheter (cm) 2 cm 12/01/19 1525  Site Assessment Clean;Dry;Intact 12/01/19 1525  Lumen #1 Status Saline locked;Blood return noted;Flushed 12/01/19 1525  Lumen #2 Status Flushed;Saline locked;Blood return noted 12/01/19 1525  Dressing Type Transparent 12/01/19 1525  Dressing Status Dry;Intact;Clean 12/01/19 1525  Dressing Intervention New dressing 12/01/19 1525  Dressing Change Due 12/08/19 12/01/19 [place], James Braun 12/01/2019, 3:26 PM

## 2019-12-01 NOTE — TOC Initial Note (Signed)
Transition of Care [facility]) - Initial/Assessment Note    Patient Details  Name: James Braun MRN: [id number] Date of Birth: 1940/09/26  Transition of Care [facility]) CM/SW Contact:    Sharin Mons, RN Phone Number: [phone] 12/01/2019, 3:30 PM  Clinical Narrative:                 Admitted s/p Coronary artery bypass grafting x3, 5/14 ( abnormal myeoview). From home with wife, Holley Raring.. Independent with ADL's PTA . No DME usage. NCM spoke with wife regarding d/c planning. Wife states she plans for husband  to transition to home once d/c. States she will be pt's caregiver and assist as needed. PCP: Viviana Simpler     [name] Kepner (Spouse)      [phone]      TOC following for TOC needs....  Expected Discharge Plan: Home/Self Care Barriers to Discharge: Continued Medical Work up   Patient Goals and CMS Choice        Expected Discharge Plan and Services Expected Discharge Plan: Home/Self Care                                   Prior Living Arrangements/Services                       Activities of Daily Living Home Assistive Devices/Equipment: Environmental consultant (specify type) ADL Screening (condition at time of admission) Patient's cognitive ability adequate to safely complete daily activities?: Yes Is the patient deaf or have difficulty hearing?: Yes Does the patient have difficulty seeing, even when wearing glasses/contacts?: No Does the patient have difficulty concentrating, remembering, or making decisions?: No Patient able to express need for assistance with ADLs?: Yes Does the patient have difficulty dressing or bathing?: Yes Independently performs ADLs?: No Communication: Needs assistance Is this a change from baseline?: Pre-admission baseline Dressing (OT): Needs assistance Is this a change from baseline?: Pre-admission baseline Grooming: Needs assistance Is this a change from baseline?: Pre-admission baseline Feeding: Appropriate for developmental  age Bathing: Needs assistance Is this a change from baseline?: Pre-admission baseline Toileting: Needs assistance Is this a change from baseline?: Pre-admission baseline In/Out Bed: Needs assistance Is this a change from baseline?: Pre-admission baseline Walks in Home: Needs assistance Is this a change from baseline?: Pre-admission baseline Does the patient have difficulty walking or climbing stairs?: Yes Weakness of Legs: Both Weakness of Arms/Hands: Both  Permission Sought/Granted                  Emotional Assessment              Admission diagnosis:  CAD (coronary artery disease) [I25.10] S/P CABG x 3 [Z95.1] Patient Active Problem List   Diagnosis Date Noted  . S/P CABG x 3 11/28/2019  . CAD (coronary artery disease) 11/27/2019  . Abnormal nuclear stress test   . Rectal bleeding 06/30/2019  . GERD (gastroesophageal reflux disease) 06/30/2019  . Anemia in chronic kidney disease (CKD) 09/05/2018  . Chronic renal disease, stage III 03/06/2018  . Chest pain of uncertain etiology 0000000  . Counseling regarding advanced directives 02/13/2014  . Onychomycosis 04/25/2013  . Routine general medical examination at a health care facility 01/04/2011  . BPH without obstruction/lower urinary tract symptoms 07/13/2010  . Controlled type 2 diabetes mellitus with diabetic nephropathy ([place]) 01/11/2010  . Hyperlipemia 01/11/2010  . Essential hypertension, benign 01/11/2010  . NEPHROLITHIASIS, HX OF 01/11/2010  PCP:  [name], MD Pharmacy:   [facility] [place] [address] Phone: [phone] Fax: [phone]  [id number] [place], [address] [place] [address] [place] [address] Phone: [phone] Fax: [phone]     Social Determinants of Health (SDOH) Interventions    Readmission Risk Interventions No flowsheet data found.

## 2019-12-01 NOTE — Telephone Encounter (Signed)
pts diabetic shoes/inserts in... lvm fot pt that he was already scheduled to see Dr Prudence Davidson on 5.27.2021 and I have put in the note for pt to pick up shoes/inserts when is there for that appt.

## 2019-12-01 NOTE — Progress Notes (Signed)
3 Days Post-Op Procedure(s) (LRB): CORONARY ARTERY BYPASS GRAFTING (CABG) x3, using left internal mammary artery and Saphenous vein harvested endoscopically (N/A) TRANSESOPHAGEAL ECHOCARDIOGRAM (TEE) (N/A) Subjective: Back in nsr\ Walked in hall coox slightly low, preop EF .35- cont mil  Objective: Vital signs in last 24 hours: Temp:  [98.2 F (36.8 C)-99.4 F (37.4 C)] 99.4 F (37.4 C) (05/17 0715) Pulse Rate:  [79-95] 88 (05/17 0700) Cardiac Rhythm: Atrial fibrillation (05/17 0400) Resp:  [11-29] 23 (05/17 0700) BP: (104-134)/(65-87) 132/87 (05/17 0700) SpO2:  [92 %-98 %] 93 % (05/17 0700) Weight:  [76.3 kg] 76.3 kg (05/17 0500)  Hemodynamic parameters for last 24 hours:  stable  Intake/Output from previous day: 05/16 0701 - 05/17 0700 In: 925.4 [I.V.:775.4; IV Piggyback:150] Out: [id number]; Chest Tube:90] Intake/Output this shift: No intake/output data recorded.       Exam    General- alert and comfortable    Neck- no JVD, no cervical adenopathy palpable, no carotid bruit   Lungs- clear without rales, wheezes   Cor- regular rate and rhythm, no murmur , gallop   Abdomen- soft, non-tender   Extremities - warm, non-tender, minimal edema   Neuro- oriented, appropriate, no focal weakness   Lab Results: Recent Labs    11/30/19 0338 12/01/19 0326  WBC 9.2 8.5  HGB 9.2* 9.3*  HCT 29.5* 29.8*  PLT 183 219   BMET:  Recent Labs    11/30/19 0338 12/01/19 0326  NA 136 134*  K 3.8 4.9  CL 107 105  CO2 22 22  GLUCOSE 119* 138*  BUN 22 27*  CREATININE 1.39* 0.92  CALCIUM 7.6* 7.6*    PT/INR:  Recent Labs    11/28/19 1601  LABPROT 16.9*  INR 1.4*   ABG    Component Value Date/Time   PHART 7.476 (H) 11/29/2019 0251   HCO3 20.1 11/29/2019 0251   TCO2 21 (L) 11/29/2019 0251   ACIDBASEDEF 2.0 11/29/2019 0251   O2SAT 53.6 12/01/2019 0326   CBG (last 3)  Recent Labs    11/30/19 0720 11/30/19 1132 11/30/19 1534  GLUCAP 157* 128* 146*     Assessment/Plan: S/P Procedure(s) (LRB): CORONARY ARTERY BYPASS GRAFTING (CABG) x3, using left internal mammary artery and Saphenous vein harvested endoscopically (N/A) TRANSESOPHAGEAL ECHOCARDIOGRAM (TEE) (N/A) Mobilize Diuresis Plan for transfer to step-down: see transfer orders Transition to po amiodarone  LOS: 4 days    Tharon Aquas Trigt III 12/01/2019

## 2019-12-01 NOTE — Plan of Care (Signed)

## 2019-12-02 ENCOUNTER — Inpatient Hospital Stay (HOSPITAL_COMMUNITY): Payer: PPO

## 2019-12-02 DIAGNOSIS — I5021 Acute systolic (congestive) heart failure: Secondary | ICD-10-CM

## 2019-12-02 DIAGNOSIS — I480 Paroxysmal atrial fibrillation: Secondary | ICD-10-CM

## 2019-12-02 DIAGNOSIS — R570 Cardiogenic shock: Secondary | ICD-10-CM

## 2019-12-02 LAB — BASIC METABOLIC PANEL
Anion gap: 9 (ref 5–15)
BUN: 24 mg/dL — ABNORMAL HIGH (ref 8–23)
CO2: 28 mmol/L (ref 22–32)
Calcium: 8.1 mg/dL — ABNORMAL LOW (ref 8.9–10.3)
Chloride: 99 mmol/L (ref 98–111)
Creatinine, Ser: 1.29 mg/dL — ABNORMAL HIGH (ref 0.61–1.24)
GFR calc Af Amer: >60 mL/min (ref >60)
GFR calc non Af Amer: 52 mL/min — ABNORMAL LOW (ref >60)
Glucose, Bld: 184 mg/dL — ABNORMAL HIGH (ref 70–99)
Potassium: 3.1 mmol/L — ABNORMAL LOW (ref 3.5–5.1)
Sodium: 136 mmol/L (ref 135–145)

## 2019-12-02 LAB — COMPREHENSIVE METABOLIC PANEL
ALT: 32 U/L (ref 0–44)
AST: 22 U/L (ref 15–41)
Albumin: 2.7 g/dL — ABNORMAL LOW (ref 3.5–5.0)
Alkaline Phosphatase: 43 U/L (ref 38–126)
Anion gap: 12 (ref 5–15)
BUN: 22 mg/dL (ref 8–23)
CO2: 26 mmol/L (ref 22–32)
Calcium: 8.1 mg/dL — ABNORMAL LOW (ref 8.9–10.3)
Chloride: 99 mmol/L (ref 98–111)
Creatinine, Ser: 1.27 mg/dL — ABNORMAL HIGH (ref 0.61–1.24)
GFR calc Af Amer: >60 mL/min (ref >60)
GFR calc non Af Amer: 53 mL/min — ABNORMAL LOW (ref >60)
Glucose, Bld: 164 mg/dL — ABNORMAL HIGH (ref 70–99)
Potassium: 2.7 mmol/L — CL (ref 3.5–5.1)
Sodium: 137 mmol/L (ref 135–145)
Total Bilirubin: 0.9 mg/dL (ref 0.3–1.2)
Total Protein: 5.6 g/dL — ABNORMAL LOW (ref 6.5–8.1)

## 2019-12-02 LAB — CBC
HCT: 30.8 % — ABNORMAL LOW (ref 39.0–52.0)
Hemoglobin: 9.6 g/dL — ABNORMAL LOW (ref 13.0–17.0)
MCH: 25.3 pg — ABNORMAL LOW (ref 26.0–34.0)
MCHC: 31.2 g/dL (ref 30.0–36.0)
MCV: 81.3 fL (ref 80.0–100.0)
Platelets: 264 10*3/uL (ref 150–400)
RBC: 3.79 MIL/uL — ABNORMAL LOW (ref 4.22–5.81)
RDW: 22.5 % — ABNORMAL HIGH (ref 11.5–15.5)
WBC: 6.4 10*3/uL (ref 4.0–10.5)
nRBC: 0 % (ref 0.0–0.2)

## 2019-12-02 LAB — COOXEMETRY PANEL
Carboxyhemoglobin: 1.5 % (ref 0.5–1.5)
Carboxyhemoglobin: 1.4 % (ref 0.5–1.5)
Carboxyhemoglobin: 1.3 % (ref 0.5–1.5)
Methemoglobin: 1.1 % (ref 0.0–1.5)
Methemoglobin: 1.1 % (ref 0.0–1.5)
Methemoglobin: 0.7 % (ref 0.0–1.5)
O2 Saturation: 50.9 %
O2 Saturation: 42.6 %
O2 Saturation: 43.1 %
Total hemoglobin: 10 g/dL — ABNORMAL LOW (ref 12.0–16.0)
Total hemoglobin: 14.4 g/dL (ref 12.0–16.0)
Total hemoglobin: 9.2 g/dL — ABNORMAL LOW (ref 12.0–16.0)

## 2019-12-02 MED ORDER — DIGOXIN 125 MCG PO TABS
0.1250 mg | ORAL_TABLET | Freq: Every day | ORAL | Status: DC
  Administered 2019-12-03 – 2019-12-08 (×7): 0.125 mg via ORAL
  Filled 2019-12-02 (×7): qty 1

## 2019-12-02 MED ORDER — CARVEDILOL 6.25 MG PO TABS: 6.2500 mg | ORAL_TABLET | Freq: Two times a day (BID) | ORAL | Status: DC

## 2019-12-02 MED ORDER — POTASSIUM CHLORIDE 10 MEQ/100ML IV SOLN
INTRAVENOUS | Status: AC
  Filled 2019-12-02: qty 100

## 2019-12-02 MED ORDER — FUROSEMIDE 10 MG/ML IJ SOLN: 40.0000 mg | Freq: Two times a day (BID) | INTRAMUSCULAR | Status: DC

## 2019-12-02 MED ORDER — FUROSEMIDE 10 MG/ML IJ SOLN
40.0000 mg | Freq: Two times a day (BID) | INTRAMUSCULAR | Status: DC
  Administered 2019-12-02 – 2019-12-03 (×2): 40 mg via INTRAVENOUS
  Filled 2019-12-02 (×2): qty 4

## 2019-12-02 MED ORDER — SPIRONOLACTONE 12.5 MG HALF TABLET
12.5000 mg | ORAL_TABLET | Freq: Every day | ORAL | Status: DC
  Administered 2019-12-03 (×2): 12.5 mg via ORAL
  Filled 2019-12-02 (×2): qty 1

## 2019-12-02 MED ORDER — MILRINONE LACTATE IN DEXTROSE 20-5 MG/100ML-% IV SOLN
0.1250 ug/kg/min | INTRAVENOUS | Status: DC
  Administered 2019-12-02 – 2019-12-04 (×3): 0.375 ug/kg/min via INTRAVENOUS
  Administered 2019-12-05 – 2019-12-06 (×2): 0.25 ug/kg/min via INTRAVENOUS
  Filled 2019-12-02 (×7): qty 100

## 2019-12-02 MED ORDER — POTASSIUM CHLORIDE CRYS ER 20 MEQ PO TBCR
40.0000 meq | EXTENDED_RELEASE_TABLET | Freq: Every day | ORAL | Status: DC
  Administered 2019-12-02 – 2019-12-03 (×2): 40 meq via ORAL
  Filled 2019-12-02 (×2): qty 2

## 2019-12-02 MED ORDER — POTASSIUM CHLORIDE 10 MEQ/100ML IV SOLN
10.0000 meq | INTRAVENOUS | Status: AC
  Administered 2019-12-02 (×3): 10 meq via INTRAVENOUS
  Filled 2019-12-02 (×2): qty 100

## 2019-12-02 MED ORDER — POTASSIUM CHLORIDE CRYS ER 20 MEQ PO TBCR: 20.0000 meq | EXTENDED_RELEASE_TABLET | Freq: Two times a day (BID) | ORAL | Status: DC

## 2019-12-02 NOTE — Progress Notes (Signed)
4 Days Post-Op Procedure(s) (LRB): CORONARY ARTERY BYPASS GRAFTING (CABG) x3, using left internal mammary artery and Saphenous vein harvested endoscopically (N/A) TRANSESOPHAGEAL ECHOCARDIOGRAM (TEE) (N/A) Subjective: Follow up coox this a.m. remains low We will hold beta-blocker, continue milrinone 0.25 Consult placed to advanced heart failure service  Objective: Vital signs in last 24 hours: Temp:  [98 F (36.7 C)-99.1 F (37.3 C)] 98.3 F (36.8 C) (05/18 0437) Pulse Rate:  [68-99] 99 (05/18 0806) Cardiac Rhythm: Normal sinus rhythm (05/18 0723) Resp:  [13-26] 18 (05/18 0437) BP: (94-126)/(62-78) 114/64 (05/18 0806) SpO2:  [91 %-99 %] 95 % (05/18 0437)  Hemodynamic parameters for last 24 hours:   Sinus stable blood pressure intake/Output from previous day: 05/17 0701 - 05/18 0700 In: 145.4 [I.V.:145.4] Out: 1875 [Urine:1875] Intake/Output this shift: Total I/O In: -  Out: 200 [Urine:200]  Patient sitting in chair after walk breathing comfortably Lungs clear Incisions clean and dry Sinus rhythm Normal edema Lab Results: Recent Labs    12/01/19 0326 12/02/19 0500  WBC 8.5 6.4  HGB 9.3* 9.6*  HCT 29.8* 30.8*  PLT 219 264   BMET:  Recent Labs    12/01/19 0326 12/02/19 0500  NA 134* 137  K 4.9 2.7*  CL 105 99  CO2 22 26  GLUCOSE 138* 164*  BUN 27* 22  CREATININE 0.92 1.27*  CALCIUM 7.6* 8.1*    PT/INR: No results for input(s): LABPROT, INR in the last 72 hours. ABG    Component Value Date/Time   PHART 7.476 (H) 11/29/2019 0251   HCO3 20.1 11/29/2019 0251   TCO2 21 (L) 11/29/2019 0251   ACIDBASEDEF 2.0 11/29/2019 0251   O2SAT 42.6 12/02/2019 0945   CBG (last 3)  Recent Labs    11/30/19 0720 11/30/19 1132 11/30/19 1534  GLUCAP 157* 128* 146*    Assessment/Plan: S/P Procedure(s) (LRB): CORONARY ARTERY BYPASS GRAFTING (CABG) x3, using left internal mammary artery and Saphenous vein harvested endoscopically (N/A) TRANSESOPHAGEAL ECHOCARDIOGRAM  (TEE) (N/A) Status post multivessel CABG with preoperative DMII and moderate LV dysfunction  Consult to advanced heart failure for optimization of hemodynamics Echocardiogram ordered   LOS: 5 days    James Braun 12/02/2019

## 2019-12-02 NOTE — Addendum Note (Signed)
Addendum  created 12/02/19 0825 by Josephine Igo, CRNA   Order list changed

## 2019-12-02 NOTE — Plan of Care (Signed)
  Problem: Education: Goal: Knowledge of General Education information will improve Description: Including pain rating scale, medication(s)/side effects and non-pharmacologic comfort measures Outcome: Progressing   Problem: Clinical Measurements: Goal: Ability to maintain clinical measurements within normal limits will improve Outcome: Progressing Goal: Will remain free from infection Outcome: Progressing Goal: Diagnostic test results will improve Outcome: Progressing   Problem: Nutrition: Goal: Adequate nutrition will be maintained Outcome: Progressing   Problem: Coping: Goal: Level of anxiety will decrease Outcome: Progressing   Problem: Pain Managment: Goal: General experience of comfort will improve Outcome: Progressing   

## 2019-12-02 NOTE — Progress Notes (Signed)
CARDIAC REHAB PHASE I   PRE:  Rate/Rhythm: 95 SR    BP: sitting 106/63    SaO2: 94 RA  MODE:  Ambulation: 510 ft   POST:  Rate/Rhythm: 105 ST    BP: sitting 121/68     SaO2: 97 RA  Pt feeling well. Able to stand independently and walk with RW. Fairly steady, no c/o. Return to recliner. Pt only c/o is boredom. Encouraged more walking and IS. 1000 mL. [place], ACSM 12/02/2019 1:39 PM

## 2019-12-02 NOTE — Progress Notes (Addendum)
      [address]       [place] [address]             [phone]      4 Days Post-Op Procedure(s) (LRB): CORONARY ARTERY BYPASS GRAFTING (CABG) x3, using left internal mammary artery and Saphenous vein harvested endoscopically (N/A) TRANSESOPHAGEAL ECHOCARDIOGRAM (TEE) (N/A)   Subjective:  States he is bored.  Has some soreness, but states he isn't in pain.  + ambulation   + BM  Objective: Vital signs in last 24 hours: Temp:  [98 F (36.7 C)-99.1 F (37.3 C)] 98.3 F (36.8 C) (05/18 0437) Pulse Rate:  [68-99] 99 (05/18 0806) Cardiac Rhythm: Normal sinus rhythm (05/18 0723) Resp:  [13-26] 18 (05/18 0437) BP: (94-126)/(62-83) 114/64 (05/18 0806) SpO2:  [91 %-99 %] 95 % (05/18 0437)  Intake/Output from previous day: 05/17 0701 - 05/18 0700 In: 145.4 [I.V.:145.4] Out: 1875 [Urine:1875]  General appearance: alert, cooperative and no distress Heart: regular rate and rhythm Lungs: clear to auscultation bilaterally Abdomen: soft, non-tender; bowel sounds normal; no masses,  no organomegaly Extremities: edema trace -1+ Wound: clean and dry  Lab Results: Recent Labs    12/01/19 0326 12/02/19 0500  WBC 8.5 6.4  HGB 9.3* 9.6*  HCT 29.8* 30.8*  PLT 219 264   BMET:  Recent Labs    12/01/19 0326 12/02/19 0500  NA 134* 137  K 4.9 2.7*  CL 105 99  CO2 22 26  GLUCOSE 138* 164*  BUN 27* 22  CREATININE 0.92 1.27*  CALCIUM 7.6* 8.1*    PT/INR: No results for input(s): LABPROT, INR in the last 72 hours. ABG    Component Value Date/Time   PHART 7.476 (H) 11/29/2019 0251   HCO3 20.1 11/29/2019 0251   TCO2 21 (L) 11/29/2019 0251   ACIDBASEDEF 2.0 11/29/2019 0251   O2SAT 50.9 12/02/2019 0510   CBG (last 3)  Recent Labs    11/30/19 0720 11/30/19 1132 11/30/19 1534  GLUCAP 157* 128* 146*    Assessment/Plan: S/P Procedure(s) (LRB): CORONARY ARTERY BYPASS GRAFTING (CABG) x3, using left internal mammary artery and Saphenous vein harvested  endoscopically (N/A) TRANSESOPHAGEAL ECHOCARDIOGRAM (TEE) (N/A)  1. CV- Sinus Tach, BP stable- on Milrinone at 0.25, Coox is 50 2. Pulm- no acute issues, continue IS 3. Renal- creatinine up to 1.27 from 0.9.Marland Kitchen this is likely due to diuretics yesterday, repeat BMET in AM 4. Hypokalemia- 2.7, IV replacement ordered per Dr. [name] 5. Expected post operative blood loss anemia, mild Hgb at 9.6 6. Dispo- patient stable, weaning milrinone as able, coox 50 on 0.25, hypokalemic supplement, will remove EPW in AM, continue current care   LOS: 5 days    Ellwood Handler, PA-C  12/02/2019  patient doing well clinically with lag in coox- will repeat   if coox remains low will check postop ech preop ef .35 Leave milrinone at .25 for now  patient examined and medical record reviewed,agree with above note. Tharon Aquas Trigt III 12/02/2019

## 2019-12-02 NOTE — Consult Note (Addendum)
Advanced Heart Failure Team Consult Note   Primary Physician: [name], MD PCP-Cardiologist:  Quay Burow, MD  Reason for Consultation: Post Cardiotomy Cardiogenic Shock  HPI:    James Braun is seen today for evaluation of post cardiotomy cardiogenic shock at the request of Dr. [name], CT surgery.   79 y/o male w/ HTN, HLD, T2DM admitted for LHC in the setting of new CP and abnormal NST. LHC showed severe multivessel CAD and he was referred for CABG. Echo showed moderately reduced LVEF, 35-40%, RV normal. No significant valvular disease. Underwent CABG x 3 (LIMA-LAD, SVG-diag, SVG-PLB) on 5/14 by Dr. [name].   Post operative course c/b atrial fibrillation treated w/ amiodarone as well as post cardiotomy shock. Unable to wean off milrinone, remains on 0.25 mcg/kg/min. Co-ox 43% this morning. Has also been getting low dose  blocker, metoprolol 12.5 mg bid.   EKG on 5/16 showed NSR but notably reduced QRS voltages diffusely. CXR today shows slightly enlarged cardiac silhouette, in comparison to prior studies.  Repeat Limited Echo has been ordered.   His BP is soft, low [id number] systolic. NSR on tele. He denies any significant dyspnea. No lightheadedness or dizziness. Also denies CP. No significant fatigue.      2D Echo 11/27/19 1. Left ventricular ejection fraction, by estimation, is 35 to 40%. The left ventricle has moderately decreased function. The left ventricle demonstrates regional wall motion abnormalities (see scoring diagram/findings for description). Left ventricular diastolic parameters are indeterminate. 2. Right ventricular systolic function is normal. The right ventricular size is normal. 3. Left atrial size was mild to moderately dilated. 4. The mitral valve is normal in structure. Mild mitral valve regurgitation. No evidence of mitral stenosis. 5. The aortic valve is tricuspid. Aortic valve regurgitation is not visualized. Mild aortic  valve sclerosis is present, with no evidence of aortic valve stenosis. 6. The inferior vena cava is normal in size with <50% respiratory variability, suggesting right atrial pressure of 8 mmHg.  Review of Systems: [y] = yes, [ ]  = no   . General: Weight gain [ ] ; Weight loss [ ] ; Anorexia [ ] ; Fatigue [ ] ; Fever [ ] ; Chills [ ] ; Weakness [ ]   . Cardiac: Chest pain/pressure [ ] ; Resting SOB [ ] ; Exertional SOB [ ] ; Orthopnea [ ] ; Pedal Edema [ ] ; Palpitations [ ] ; Syncope [ ] ; Presyncope [ ] ; Paroxysmal nocturnal dyspnea[ ]   . Pulmonary: Cough [ ] ; Wheezing[ ] ; Hemoptysis[ ] ; Sputum [ ] ; Snoring [ ]   . GI: Vomiting[ ] ; Dysphagia[ ] ; Melena[ ] ; Hematochezia [ ] ; Heartburn[ ] ; Abdominal pain [ ] ; Constipation [ ] ; Diarrhea [ ] ; BRBPR [ ]   . GU: Hematuria[ ] ; Dysuria [ ] ; Nocturia[ ]   . Vascular: Pain in legs with walking [ ] ; Pain in feet with lying flat [ ] ; Non-healing sores [ ] ; Stroke [ ] ; TIA [ ] ; Slurred speech [ ] ;  . Neuro: Headaches[ ] ; Vertigo[ ] ; Seizures[ ] ; Paresthesias[ ] ;Blurred vision [ ] ; Diplopia [ ] ; Vision changes [ ]   . Ortho/Skin: Arthritis [ ] ; Joint pain [ ] ; Muscle pain [ ] ; Joint swelling [ ] ; Back Pain [ ] ; Rash [ ]   . Psych: Depression[ ] ; Anxiety[ ]   . Heme: Bleeding problems [ ] ; Clotting disorders [ ] ; Anemia [ ]   . Endocrine: Diabetes [ ] ; Thyroid dysfunction[ ]   Home Medications Prior to Admission medications   Medication Sig Start Date End Date Taking? Authorizing Provider  alum hydroxide-mag trisilicate (  GAVISCON) 80-20 MG CHEW chewable tablet Chew 2 tablets by mouth 3 (three) times daily as needed for indigestion or heartburn.   Yes [provider]  diltiazem (TIAZAC) 240 MG 24 hr capsule Take 1 capsule (240 mg total) by mouth daily. 09/08/19  Yes [name], MD  finasteride (PROSCAR) 5 MG tablet Take 1 tablet (5 mg total) by mouth daily. 09/08/19  Yes [name], MD  GLUCOSAMINE-CHONDROITIN PO Take 1 tablet by mouth 2 (two) times daily.    Yes [provider]  metFORMIN (GLUCOPHAGE-XR) 500 MG 24 hr tablet Take 1 tablet (500 mg total) by mouth 2 (two) times daily. 09/08/19  Yes [name], MD  Multiple Vitamin (MULTIVITAMIN) tablet Take 1 tablet by mouth daily.   Yes [provider]  omeprazole (PRILOSEC) 20 MG capsule Take 1 capsule (20 mg total) by mouth 2 (two) times daily before a meal. Patient taking differently: Take 20 mg by mouth daily.  11/04/19  Yes [name], Kirke Corin, MD  Potassium Citrate 15 MEQ (1620 MG) TBCR TAKE 1 TABLET BY MOUTH 3 TIMES A DAY Patient taking differently: Take 1 tablet by mouth 3 (three) times daily.  05/16/19  Yes [name], MD  Red Yeast Rice Extract (RED YEAST RICE PO) Take 1 tablet by mouth daily.    Yes [provider]  sodium chloride (MURO 128) 5 % ophthalmic solution Place 1 drop into both eyes daily.   Yes [provider]  telmisartan (MICARDIS) 80 MG tablet Take 1 tablet (80 mg total) by mouth every morning. 09/08/19  Yes Viviana Simpler I, MD  timolol (TIMOPTIC) 0.5 % ophthalmic solution Place 1 drop into both eyes 2 (two) times daily.  09/24/18  Yes [provider]  triamterene-hydrochlorothiazide (MAXZIDE-25) 37.5-25 MG tablet Take 1 tablet by mouth daily. 09/08/19  Yes [name], MD  isosorbide mononitrate (IMDUR) 30 MG 24 hr tablet Take 0.5 tablets (15 mg total) by mouth daily. 11/21/19 02/19/20  [name], MD  metoprolol tartrate (LOPRESSOR) 50 MG tablet Take 1 tablet by mouth once for procedure. 11/11/19   [name], MD  sildenafil (REVATIO) 20 MG tablet Take 3-5 tablets (60-100 mg total) by mouth daily as needed. Patient taking differently: Take 20 mg by mouth daily as needed (ed).  09/05/18   [name], MD    Past Medical History: Past Medical History:  Diagnosis Date  . BPH (benign prostatic hypertrophy)   . Coronary artery disease   . History of kidney stones   . History of melanoma excision OCT  2010  . Hyperlipidemia   . Hypertension   . Left ureteral calculus   . Nephrolithiasis    bilateral  . Type 2 diabetes mellitus ([place])     Past Surgical History: Past Surgical History:  Procedure Laterality Date  . CHOLECYSTECTOMY  july 2000  . CORONARY ARTERY BYPASS GRAFT N/A 11/28/2019   Procedure: CORONARY ARTERY BYPASS GRAFTING (CABG) x3, using left internal mammary artery and Saphenous vein harvested endoscopically;  Surgeon: [name], MD;  Location[facility];  Service: Open Heart Surgery;  Laterality: N/A;  . CYSTOSCOPY WITH STENT PLACEMENT Left 11/14/2012   Procedure: CYSTOSCOPY WITH STENT PLACEMENT;  Surgeon: [name], MD;  Location[facility];  Service: Urology;  Laterality: Left;  . CYSTOSCOPY/RETROGRADE/URETEROSCOPY/STONE EXTRACTION WITH BASKET Left 11/14/2012   Procedure: CYSTOSCOPY/RETROGRADE/URETEROSCOPY/STONE EXTRACTION WITH BASKET;  Surgeon: [name], MD;  Location: [facility];  Service:  Urology;  Laterality: Left;  . EXTRACORPOREAL SHOCK WAVE LITHOTRIPSY  JULY 2000   X2  . HOLMIUM LASER APPLICATION Left [id number]   Procedure: HOLMIUM LASER APPLICATION;  Surgeon: [name], MD;  Location: [facility];  Service: Urology;  Laterality: Left;  . LEFT HEART CATH AND CORONARY ANGIOGRAPHY N/A 11/27/2019   Procedure: LEFT HEART CATH AND CORONARY ANGIOGRAPHY;  Surgeon: [name], MD;  Location: [place] [facility];  Service: Cardiovascular;  Laterality: N/A;  . LEFT URETEROSCOPIC STONE EXTRACTION  02-22-2007  . NEPHROLITHOTOMY Right 09-28-2010   percutaneous  . ORIF CLAVICLE FRACTURE  2006  . PROSTATE BIOPSY  1998   neg  . SHOULDER ARTHROSCOPY  1/15   Dr [name]  . TEE WITHOUT CARDIOVERSION N/A 11/28/2019   Procedure: TRANSESOPHAGEAL ECHOCARDIOGRAM (TEE);  Surgeon: [name], Collier Salina, MD;  Location: [place];  Service: Open Heart Surgery;  Laterality: N/A;  . TONSILLECTOMY      Family History: Family  History  Problem Relation Age of Onset  . Hypertension Mother   . Diabetes Father   . Hypertension Father   . Coronary artery disease Neg Hx   . Cancer Neg Hx     Social History: Social History   Socioeconomic History  . Marital status: Married    Spouse name: Not on file  . Number of children: 2  . Years of education: Not on file  . Highest education level: Not on file  Occupational History  . 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 Occupation: Buys very distressed houses and rehabilitates  . Occupation: Does mediation  Tobacco Use  . Smoking status: Never Smoker  . Smokeless tobacco: Never Used  Substance and Sexual Activity  . Alcohol use: No  . Drug use: No  . Sexual activity: Not on file  Other Topics Concern  . Not on file  Social History Narrative   Has living will   Wife is health care POA---alternate is committee of physicians   Would accept resuscitation---wouldn't accept prolonged ventilation   No tube feeds if cognitively unaware   Social Determinants of Health   Financial Resource Strain:   . Difficulty of Paying Living Expenses:   Food Insecurity:   . Worried About Charity fundraiser in the Last Year:   . Arboriculturist in the Last Year:   Transportation Needs:   . Film/video editor (Medical):   Marland Kitchen Lack of Transportation (Non-Medical):   Physical Activity:   . Days of Exercise per Week:   . Minutes of Exercise per Session:   Stress:   . Feeling of Stress :   Social Connections:   . Frequency of Communication with Friends and Family:   . Frequency of Social Gatherings with Friends and Family:   . Attends Religious Services:   . Active Member of Clubs or Organizations:   . Attends Archivist Meetings:   Marland Kitchen Marital Status:     Allergies:  Allergies  Allergen Reactions  . Atorvastatin Other (See Comments)    myalgias  . Morphine And Related Other (See Comments)     hypotension  . Levofloxacin Rash  . Septra  [Sulfamethoxazole-Trimethoprim] Rash    Objective:    Vital Signs:   Temp:  [98 F (36.7 C)-99.1 F (37.3 C)] 98.2 F (36.8 C) (05/18 1206) Pulse Rate:  [68-99] 92 (05/18 1206) Resp:  [18-20] 20 (05/18 1206) BP: (94-126)/(58-77) 100/58 (05/18 1206) SpO2:  [92 %-99 %] 94 % (05/18 1206) Last  BM Date: 12/01/19  Weight change: Filed Weights   11/29/19 0400 11/30/19 0500 12/01/19 0500  Weight: 75 kg 76.6 kg 76.3 kg    Intake/Output:   Intake/Output Summary (Last 24 hours) at 12/02/2019 1513 Last data filed at 12/02/2019 [id number] Gross per 24 hour  Intake 5.07 ml  Output 1875 ml  Net -1869.93 ml      Physical Exam    General:  Well appearing elderly WM, sitting up in chair. No resp difficulty HEENT: normal Neck: supple. Unable to assess JVD, previous Rt IJ CVC site bandaged . Carotids 2+ bilat; no bruits. No lymphadenopathy or thyromegaly appreciated. Cor: PMI nondisplaced. Regular rate & rhythm. No rubs, gallops or murmurs. Sternotomy incision stable  Lungs: decreased BS at the bases bilaterally, R>L Abdomen: soft, nontender, nondistended. No hepatosplenomegaly. No bruits or masses. Good bowel sounds. Extremities: no cyanosis, clubbing, rash, 1+ bilateral LEE edema, + RUE PICC  Neuro: alert & orientedx3, cranial nerves grossly intact. moves all 4 extremities w/o difficulty. Affect pleasant   Telemetry   NSR 80s   EKG    EKG 5/16 showed NSR w/ low voltage QRS complexes   Labs   Basic Metabolic Panel: Recent Labs  Lab 11/28/19 2244 11/29/19 0251 11/29/19 0558 11/29/19 0558 11/29/19 1638 11/29/19 1638 11/30/19 0338 12/01/19 0326 12/02/19 0500  NA 136   < > 139  --  136  --  136 134* 137  K 3.6   < > 3.9  --  4.6  --  3.8 4.9 2.7*  CL 106  --  108  --  108  --  107 105 99  CO2 23  --  22  --  22  --  22 22 26   GLUCOSE 123*  --  127*  --  147*  --  119* 138* 164*  BUN 15  --  18  --  19  --  22 27* 22  CREATININE 1.13  --  1.29*  --  1.21  --  1.39* 0.92 1.27*   CALCIUM 7.1*  --  7.6*   < > 7.5*   < > 7.6* 7.6* 8.1*  MG 2.5*  --  2.5*  --  2.5*  --   --  2.0  --    < > = values in this interval not displayed.    Liver Function Tests: Recent Labs  Lab 11/27/19 1847 11/29/19 0558 12/02/19 0500  AST 21 92* 22  ALT 20 50* 32  ALKPHOS 45 34* 43  BILITOT 0.5 1.6* 0.9  PROT 6.7 5.1* 5.6*  ALBUMIN 3.9 3.3* 2.7*   No results for input(s): LIPASE, AMYLASE in the last 168 hours. No results for input(s): AMMONIA in the last 168 hours.  CBC: Recent Labs  Lab 11/29/19 0558 11/29/19 1638 11/30/19 0338 12/01/19 0326 12/02/19 0500  WBC 10.4 8.6 9.2 8.5 6.4  HGB 9.2* 9.3* 9.2* 9.3* 9.6*  HCT 28.8* 30.2* 29.5* 29.8* 30.8*  MCV 80.2 81.6 81.9 82.1 81.3  PLT 194 177 183 219 264    Cardiac Enzymes: No results for input(s): CKTOTAL, CKMB, CKMBINDEX, TROPONINI in the last 168 hours.  BNP: BNP (last 3 results) No results for input(s): BNP in the last 8760 hours.  ProBNP (last 3 results) No results for input(s): PROBNP in the last 8760 hours.   CBG: Recent Labs  Lab 11/29/19 2356 11/30/19 0335 11/30/19 0720 11/30/19 1132 11/30/19 1534  GLUCAP 116* 114* 157* 128* 146*    Coagulation Studies: No results for  input(s): LABPROT, INR in the last 72 hours.   Imaging   DG Chest Port 1 View  Result Date: 12/02/2019 CLINICAL DATA:  Sore chest, status post CABG EXAM: PORTABLE CHEST 1 VIEW COMPARISON:  12/01/2019 FINDINGS: Interval removal of right IJ venous sheath. Right arm PICC terminates at the cavoatrial junction. Small left pleural effusion. Associated left basilar atelectasis. No frank interstitial edema. No pneumothorax. Cardiomegaly. Postsurgical changes related to prior CABG. Thoracic aortic atherosclerosis. Median sternotomy. IMPRESSION: Postsurgical changes related to prior CABG. Small left pleural effusion. Associated left basilar atelectasis. No pneumothorax. Right arm PICC terminates at the cavoatrial junction. Electronically  Signed   By: Julian Hy M.D.   On: 12/02/2019 08:30      Medications:     Current Medications: . acetaminophen  1,000 mg Oral Q6H  . amiodarone  200 mg Oral BID  . aspirin EC  325 mg Oral Daily  . bisacodyl  10 mg Oral Daily   Or  . bisacodyl  10 mg Rectal Daily  . [START ON 12/03/2019] carvedilol  6.25 mg Oral BID WC  . Chlorhexidine Gluconate Cloth  6 each Topical Daily  . docusate sodium  200 mg Oral Daily  . enoxaparin (LOVENOX) injection  30 mg Subcutaneous QHS  . finasteride  5 mg Oral QPM  . mouth rinse  15 mL Mouth Rinse BID  . pantoprazole  40 mg Oral Daily  . potassium chloride  40 mEq Oral Daily  . sodium chloride flush  10 mL Intravenous Q12H  . sodium chloride flush  10-40 mL Intracatheter Q12H  . sodium chloride flush  3 mL Intravenous Q12H  . timolol  1 drop Both Eyes BID     Infusions: . sodium chloride    . sodium chloride Stopped (11/30/19 2023)  . milrinone 0.25 mcg/kg/min (12/02/19 1510)     Assessment/Plan   1. CAD - s/p CABG x 3 5/14 (LIMA-LAD, SVG-dig, SVG- PLB) - stable w/o CP  - continue ASA + statin  - hold  blocker w/ shock   2. Post Cardiotomy Cardiogenic Shock - Day 4 post-op CABG - unable to wean off milrinone. Co-ox this am 43% on 0.25 mcg/kg/min - will stop  blocker - increase milrinone to 0.375. Recheck co-ox 3 hrs later. If co-ox remains <50%, add NE - plan repeat limited echo to reassess LVEF and r/o pericardial effusion (EKG changes, w/ low voltage QRS and increased size of pericardial silhouette). I have called echo to expedite  - try to add digoxin once hypokalemia better corrected. Repeat BMP pending   3. Acute Systolic HF - LVEF [id number]. Ischemic CM 2/2 multivessel CAD - Will repeat echo to reassess EF given persistent post-cardiotomy shock - He remains volume overloaded. Up ~20 lb above preop wt. CVP ~15  - Increase milrinone to 0.375 - Increase lasix to 40 mg bid  4. Hypokalemia - K 2.7 on am labs -  received IV + PO supplementation  - repeat BMP   5. Post-operative Afib - in NSR currently - continue PO amiodarone 200 mg bid - no anticoagulation unless recurrence    Length of Stay: 5  Brittainy Simmons, PA-C  12/02/2019, 3:13 PM  Advanced Heart Failure Team Pager [phone] (M-F; 7a - 4p)  Please contact [facility] for night-coverage after hours (4p -7a ) and weekends on [email or web address]  Patient seen and examined with the above-signed Advanced Practice Provider and/or Housestaff. I personally reviewed laboratory data, imaging studies and relevant notes. I independently examined the  patient and formulated the important aspects of the plan. I have edited the note to reflect any of my changes or salient points. I have personally discussed the plan with the patient and/or family.  79 y/o male with h/o HTN, DM2, and HL admitted after outpatient cath showed severe CAD with EF 35%. Pre-op echo EF 40-45%  Underwent CABG x 3 on 5/14 which was uneventful. Post-op course c/b volume overload and low co-ox.   Says he feels ok. Able to walk the halls without too much problem. Weight still up about 15 pound CVP on my check 7-8.   Remains on milrinone co-ox 43%  On exam General:  Elderly male NAD HEENT: normal Neck: supple. JVP 8-9 Carotids 2+ bilat; no bruits. No lymphadenopathy or thryomegaly appreciated. Cor: sternal wound ok PMI nondisplaced. Regular rate & rhythm. No rubs, gallops or murmurs. Lungs: clear decreased at left base Abdomen: soft, nontender, nondistended. No hepatosplenomegaly. No bruits or masses. Good bowel sounds. Extremities: no cyanosis, clubbing, rash, 2+ edema Neuro: alert & orientedx3, cranial nerves grossly intact. moves all 4 extremities w/o difficulty. Affect pleasant  Clinically doing pretty well post-op but co-ox remains quite low despite milrinone. Remains volume overloaded on exam with mild hypoxia. Renal function stable.   I did bedside echo personally and EF  35% with PVC dyssynchrony  with mild RV HK. No effusion.   On tele in NSR with frequent PVC over last few hours.   Will continue milrinone at current dose. Suspect co-ox may be low in part due to arterial hypoxia as well.  Place TED hose. Continue diuresis. Start amio to suppress PVCs. Add spiro and digoxin. Hold off on b-blocker. Encourage IS. Will follow closely,  Glori Bickers, MD  10:05 PM

## 2019-12-02 NOTE — Plan of Care (Signed)

## 2019-12-03 ENCOUNTER — Inpatient Hospital Stay (HOSPITAL_COMMUNITY): Payer: PPO

## 2019-12-03 DIAGNOSIS — I5022 Chronic systolic (congestive) heart failure: Secondary | ICD-10-CM

## 2019-12-03 DIAGNOSIS — I361 Nonrheumatic tricuspid (valve) insufficiency: Secondary | ICD-10-CM

## 2019-12-03 DIAGNOSIS — I480 Paroxysmal atrial fibrillation: Secondary | ICD-10-CM

## 2019-12-03 LAB — COMPREHENSIVE METABOLIC PANEL
ALT: 26 U/L (ref 0–44)
AST: 18 U/L (ref 15–41)
Albumin: 2.7 g/dL — ABNORMAL LOW (ref 3.5–5.0)
Alkaline Phosphatase: 43 U/L (ref 38–126)
Anion gap: 12 (ref 5–15)
BUN: 23 mg/dL (ref 8–23)
CO2: 28 mmol/L (ref 22–32)
Calcium: 8.2 mg/dL — ABNORMAL LOW (ref 8.9–10.3)
Chloride: 97 mmol/L — ABNORMAL LOW (ref 98–111)
Creatinine, Ser: 1.47 mg/dL — ABNORMAL HIGH (ref 0.61–1.24)
GFR calc Af Amer: 52 mL/min — ABNORMAL LOW (ref >60)
GFR calc non Af Amer: 45 mL/min — ABNORMAL LOW (ref >60)
Glucose, Bld: 182 mg/dL — ABNORMAL HIGH (ref 70–99)
Potassium: 2.6 mmol/L — CL (ref 3.5–5.1)
Sodium: 137 mmol/L (ref 135–145)
Total Bilirubin: 0.6 mg/dL (ref 0.3–1.2)
Total Protein: 5.7 g/dL — ABNORMAL LOW (ref 6.5–8.1)

## 2019-12-03 LAB — BASIC METABOLIC PANEL
Anion gap: 11 (ref 5–15)
BUN: 27 mg/dL — ABNORMAL HIGH (ref 8–23)
CO2: 28 mmol/L (ref 22–32)
Calcium: 8.2 mg/dL — ABNORMAL LOW (ref 8.9–10.3)
Chloride: 93 mmol/L — ABNORMAL LOW (ref 98–111)
Creatinine, Ser: 1.48 mg/dL — ABNORMAL HIGH (ref 0.61–1.24)
GFR calc Af Amer: 51 mL/min — ABNORMAL LOW (ref >60)
GFR calc non Af Amer: 44 mL/min — ABNORMAL LOW (ref >60)
Glucose, Bld: 349 mg/dL — ABNORMAL HIGH (ref 70–99)
Potassium: 3.8 mmol/L (ref 3.5–5.1)
Sodium: 132 mmol/L — ABNORMAL LOW (ref 135–145)

## 2019-12-03 LAB — ECHOCARDIOGRAM LIMITED
Height: 67 in
Weight: 2507.95 oz

## 2019-12-03 LAB — CBC
HCT: 30.4 % — ABNORMAL LOW (ref 39.0–52.0)
Hemoglobin: 9.5 g/dL — ABNORMAL LOW (ref 13.0–17.0)
MCH: 25.3 pg — ABNORMAL LOW (ref 26.0–34.0)
MCHC: 31.3 g/dL (ref 30.0–36.0)
MCV: 81.1 fL (ref 80.0–100.0)
Platelets: 319 10*3/uL (ref 150–400)
RBC: 3.75 MIL/uL — ABNORMAL LOW (ref 4.22–5.81)
RDW: 22.5 % — ABNORMAL HIGH (ref 11.5–15.5)
WBC: 6.5 10*3/uL (ref 4.0–10.5)
nRBC: 0.5 % — ABNORMAL HIGH (ref 0.0–0.2)

## 2019-12-03 LAB — MAGNESIUM: Magnesium: 1.7 mg/dL (ref 1.7–2.4)

## 2019-12-03 LAB — GLUCOSE, CAPILLARY
Glucose-Capillary: 160 mg/dL — ABNORMAL HIGH (ref 70–99)
Glucose-Capillary: 194 mg/dL — ABNORMAL HIGH (ref 70–99)
Glucose-Capillary: 195 mg/dL — ABNORMAL HIGH (ref 70–99)

## 2019-12-03 LAB — COOXEMETRY PANEL
Carboxyhemoglobin: 1.5 % (ref 0.5–1.5)
Carboxyhemoglobin: 1.3 % (ref 0.5–1.5)
Methemoglobin: 1.2 % (ref 0.0–1.5)
Methemoglobin: 0.6 % (ref 0.0–1.5)
O2 Saturation: 46.2 %
O2 Saturation: 52.8 %
Total hemoglobin: 9.9 g/dL — ABNORMAL LOW (ref 12.0–16.0)
Total hemoglobin: 9.5 g/dL — ABNORMAL LOW (ref 12.0–16.0)

## 2019-12-03 MED ORDER — INSULIN ASPART 100 UNIT/ML ~~LOC~~ SOLN
0.0000 [IU] | Freq: Three times a day (TID) | SUBCUTANEOUS | Status: DC
  Administered 2019-12-04 (×3): 2 [IU] via SUBCUTANEOUS
  Administered 2019-12-05: 1 [IU] via SUBCUTANEOUS

## 2019-12-03 MED ORDER — POTASSIUM CHLORIDE 10 MEQ/100ML IV SOLN
10.0000 meq | INTRAVENOUS | Status: AC
  Administered 2019-12-03: 10 meq via INTRAVENOUS

## 2019-12-03 MED ORDER — MAGNESIUM SULFATE 4 GM/100ML IV SOLN
4.0000 g | Freq: Once | INTRAVENOUS | Status: AC
  Administered 2019-12-03: 4 g via INTRAVENOUS
  Filled 2019-12-03: qty 100

## 2019-12-03 MED ORDER — MAGNESIUM SULFATE 2 GM/50ML IV SOLN: 2.0000 g | Freq: Once | INTRAVENOUS | Status: DC

## 2019-12-03 MED ORDER — POTASSIUM CHLORIDE 10 MEQ/100ML IV SOLN
10.0000 meq | INTRAVENOUS | Status: AC
  Administered 2019-12-03 (×5): 10 meq via INTRAVENOUS
  Filled 2019-12-03 (×6): qty 100

## 2019-12-03 MED ORDER — AMIODARONE HCL IN DEXTROSE 360-4.14 MG/200ML-% IV SOLN
30.0000 mg/h | INTRAVENOUS | Status: DC
  Administered 2019-12-03 – 2019-12-04 (×2): 30 mg/h via INTRAVENOUS
  Filled 2019-12-03 (×4): qty 200

## 2019-12-03 MED ORDER — AMIODARONE HCL IN DEXTROSE 360-4.14 MG/200ML-% IV SOLN
60.0000 mg/h | INTRAVENOUS | Status: AC
  Administered 2019-12-03: 60 mg/h via INTRAVENOUS
  Filled 2019-12-03: qty 200

## 2019-12-03 MED ORDER — AMIODARONE IV BOLUS ONLY 150 MG/100ML
150.0000 mg | Freq: Once | INTRAVENOUS | Status: AC
  Administered 2019-12-03: 150 mg via INTRAVENOUS
  Filled 2019-12-03: qty 100

## 2019-12-03 MED ORDER — POTASSIUM CHLORIDE 10 MEQ/100ML IV SOLN
10.0000 meq | INTRAVENOUS | Status: DC
  Administered 2019-12-03: 10 meq via INTRAVENOUS
  Filled 2019-12-03: qty 100

## 2019-12-03 MED FILL — Magnesium Sulfate Inj 50%: INTRAMUSCULAR | Qty: 10 | Status: AC

## 2019-12-03 MED FILL — Heparin Sodium (Porcine) Inj 1000 Unit/ML: INTRAMUSCULAR | Qty: 30 | Status: AC

## 2019-12-03 MED FILL — Potassium Chloride Inj 2 mEq/ML: INTRAVENOUS | Qty: 40 | Status: AC

## 2019-12-03 NOTE — Progress Notes (Addendum)
Advanced Heart Failure Rounding Note  PCP-Cardiologist: Quay Burow, MD    Patient Profile   79 y/o male with h/o HTN, DM2, and HL admitted after outpatient cath showed severe CAD with EF 35%. Pre-op echo EF 40-45%  Underwent CABG x 3 on 5/14 which was uneventful. Post-op course c/b volume overload and low co-ox.   Repeat limited echo done 5/18. EF 35% with PVC dyssynchrony  with mild RV HK. No effusion  Subjective:    Noted to have frequent PVCs on tele 5/18. PO Amiodarone started.  blocker discontinued w/ low co-ox. Digoxin and spironolactone added. Remains on milrinone 0.375.    Had recurrent Afib overnight. Converted back to NSR this am after bolus of IV amiodarone.   Co-ox still low at 46% but lab drawn when in afib. Repeat co-ox pending. Hgb stable 9.5.   K low 2.6. IV K runs ordered. Mg 1.7   Volume status ok. CVP 6-7. Wt back down to pre-op wt. Scr up 1.29>>1.47   Despite low co-ox, he feels fairly well. No dyspnea or fatigue. No CP.   He continues w/ frequent PVCs on tele, ~20/hr.    Objective:   Weight Range: 71.1 kg Body mass index is 24.55 kg/m.   Vital Signs:   Temp:  [97.4 F (36.3 C)-98.8 F (37.1 C)] 98.4 F (36.9 C) (05/19 0400) Pulse Rate:  [90-105] 98 (05/19 0911) Resp:  [12-20] 16 (05/19 0400) BP: (100-117)/(50-68) 105/62 (05/19 0400) SpO2:  [91 %-96 %] 92 % (05/19 0400) Weight:  [71.1 kg] 71.1 kg (05/19 0502) Last BM Date: 12/01/19  Weight change: Filed Weights   11/30/19 0500 12/01/19 0500 12/03/19 0502  Weight: 76.6 kg 76.3 kg 71.1 kg    Intake/Output:   Intake/Output Summary (Last 24 hours) at 12/03/2019 0927 Last data filed at 12/03/2019 0500 Gross per 24 hour  Intake 359.16 ml  Output 1375 ml  Net -1015.84 ml      Physical Exam    CVP 6-7 General:  Well appearing WM, laying in bed. No resp difficulty HEENT: Normal Neck: Supple. JVP ~7 cm . Carotids 2+ bilat; no bruits. No lymphadenopathy or thyromegaly  appreciated. Cor: PMI nondisplaced. Regular rate & rhythm, w/ frequent PVCs. No rubs, gallops or murmurs. Sternotomy site ok Lungs: Clear Abdomen: Soft, nontender, nondistended. No hepatosplenomegaly. No bruits or masses. Good bowel sounds. Extremities: No cyanosis, clubbing, rash, edema  Neuro: Alert & orientedx3, cranial nerves grossly intact. moves all 4 extremities w/o difficulty. Affect pleasant   Telemetry   Afib overnight. Back in NSR currently. Frequent PVCs ~20/hr   EKG    No new EKG to revies   Labs    CBC Recent Labs    12/02/19 0500 12/03/19 0500  WBC 6.4 6.5  HGB 9.6* 9.5*  HCT 30.8* 30.4*  MCV 81.3 81.1  PLT 264 [id number]   Basic Metabolic Panel Recent Labs    12/01/19 0326 12/02/19 0500 12/02/19 1800 12/03/19 0500  NA 134*   < > 136 137  K 4.9   < > 3.1* 2.6*  CL 105   < > 99 97*  CO2 22   < > 28 28  GLUCOSE 138*   < > 184* 182*  BUN 27*   < > 24* 23  CREATININE 0.92   < > 1.29* 1.47*  CALCIUM 7.6*   < > 8.1* 8.2*  MG 2.0  --   --  1.7   < > = values in this interval not  displayed.   Liver Function Tests Recent Labs    12/02/19 0500 12/03/19 0500  AST 22 18  ALT 32 26  ALKPHOS 43 43  BILITOT 0.9 0.6  PROT 5.6* 5.7*  ALBUMIN 2.7* 2.7*   No results for input(s): LIPASE, AMYLASE in the last 72 hours. Cardiac Enzymes No results for input(s): CKTOTAL, CKMB, CKMBINDEX, TROPONINI in the last 72 hours.  BNP: BNP (last 3 results) No results for input(s): BNP in the last 8760 hours.  ProBNP (last 3 results) No results for input(s): PROBNP in the last 8760 hours.   D-Dimer No results for input(s): DDIMER in the last 72 hours. Hemoglobin A1C No results for input(s): HGBA1C in the last 72 hours. Fasting Lipid Panel No results for input(s): CHOL, HDL, LDLCALC, TRIG, CHOLHDL, LDLDIRECT in the last 72 hours. Thyroid Function Tests No results for input(s): TSH, T4TOTAL, T3FREE, THYROIDAB in the last 72 hours.  Invalid input(s): FREET3  Other  results:   Imaging     No results found.   Medications:     Scheduled Medications: . acetaminophen  1,000 mg Oral Q6H  . amiodarone  200 mg Oral BID  . aspirin EC  325 mg Oral Daily  . bisacodyl  10 mg Oral Daily   Or  . bisacodyl  10 mg Rectal Daily  . Chlorhexidine Gluconate Cloth  6 each Topical Daily  . digoxin  0.125 mg Oral Daily  . docusate sodium  200 mg Oral Daily  . finasteride  5 mg Oral QPM  . furosemide  40 mg Intravenous BID  . mouth rinse  15 mL Mouth Rinse BID  . pantoprazole  40 mg Oral Daily  . potassium chloride  40 mEq Oral Daily  . sodium chloride flush  10 mL Intravenous Q12H  . sodium chloride flush  10-40 mL Intracatheter Q12H  . sodium chloride flush  3 mL Intravenous Q12H  . spironolactone  12.5 mg Oral Daily  . timolol  1 drop Both Eyes BID     Infusions: . sodium chloride    . sodium chloride Stopped (11/30/19 2023)  . milrinone 0.375 mcg/kg/min (12/02/19 1759)  . potassium chloride 10 mEq (12/03/19 0923)     PRN Medications:  sodium chloride, ondansetron (ZOFRAN) IV, oxyCODONE, sodium chloride flush, sodium chloride flush, sodium chloride flush, traMADol     Assessment/Plan   1. CAD - s/p CABG x 3 5/14 (LIMA-LAD, SVG-dig, SVG- PLB) - stable w/o CP  - continue ASA + statin  - hold ? blocker w/ shock   2. Post Cardiotomy Cardiogenic Shock - Day 5 post-op CABG - unable to wean off milrinone. Co-ox this am 46% on 0.375 mcg/kg/min. He was in afib when labs were drawn. Back in NSR. Will repeat co-ox now. - ? blocker has been discontinued - repeat limited echo shows LVEF ~35% (35-40% pre-op). No pericardial effusion. RV appears more dilated.  - Frequent PVCs may be contributing. Change amiodarone from PO to IV to load - Continue digoxin   3. Acute Systolic HF - Pre CABG LVEF 35-40%. 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 No effusion. RV appears mildly dilated  - Volume status improved w/ IV  Lasix. CVP 6-7. SCr up. Will hold IV Lasix today. Transition to PO in am - Continue milrinone 0.375, will wean as tolerated  - Continue digoxin 0.125 qd - Continue spironolactone 12.5 mg daily  - BP too soft for ARB/ARNi  - no  blocker  w/ low output   4. Hypokalemia - K 2.6 on am labs. Mg 1.7 - IV K ordered. Supp Mg - Continue Spiro  - repeat BMP at 1400   5. Post-operative Afib - Had recurrent afib overnight, converted back to NSR after bolus of IV amiodarone - given frequent recurrence + frequent PVCs, will switch to IV amiodarone at 60 mg/hr. Change back to PO after IV Load   - need to consider a/c. Defer timing to CT surgery - keep K >4.0 and Mg >2.0  6. PVCs - frequent, ~20 hr on tele - start IV amiodarone 60mg /hr - supp K and Mg  7. AKI - Scr up 1.29>>1.47 - CVP 6-7 - Hold lasix today  - monitor trend w/ spiro - repeat BMP in am      Length of Stay: [address] PA-C  12/03/2019, 9:27 AM  Advanced Heart Failure Team Pager [phone] (M-F; [place])  Please contact [facility] for night-coverage after hours (4p -7a ) and weekends on [email or web address]  Patient seen and examined with the above-signed Advanced Practice Provider and/or Housestaff. I personally reviewed laboratory data, imaging studies and relevant notes. I independently examined the patient and formulated the important aspects of the plan. I have edited the note to reflect any of my changes or salient points. I have personally discussed the plan with the patient and/or family.  Back in AF this am. Still with frequent PVCs. IV amio restarted. Weight down to near baseline. CVP 5-6. Co-ox still low on milrinone 0.375.   Formal echo done this am LV EF 30-35% RV moderately HK and dilated   General:  Sitting up in bedNo resp difficulty HEENT: normal Neck: supple. no JVD. Carotids 2+ bilat; no bruits. No lymphadenopathy or thryomegaly appreciated. Cor: PMI nondisplaced. Irregular. No rubs, gallops or  murmurs. Lungs: clear Abdomen: soft, nontender, nondistended. No hepatosplenomegaly. No bruits or masses. Good bowel sounds. Extremities: no cyanosis, clubbing, rash, 1+ edema Neuro: alert & orientedx3, cranial nerves grossly intact. moves all 4 extremities w/o difficulty. Affect pleasant  Still a bit tenuous. Co-ox low. Suspect due primarily due to PVCs and RV dysfunction but CVP not that high. Will hold lasix. Agree with restarting IV amio to suppress AF and PVCs. Will discuss starting warfarin with Dr. [name]. Continue to mobilize. Continue dig and spiro. Supp K aggressively.   Glori Bickers, MD  10:27 AM

## 2019-12-03 NOTE — Progress Notes (Signed)
  Echocardiogram 2D Echocardiogram has been performed.  James Braun 12/03/2019, 8:50 AM

## 2019-12-03 NOTE — Progress Notes (Addendum)
      [address]       [place] [address]             [phone]      5 Days Post-Op Procedure(s) (LRB): CORONARY ARTERY BYPASS GRAFTING (CABG) x3, using left internal mammary artery and Saphenous vein harvested endoscopically (N/A) TRANSESOPHAGEAL ECHOCARDIOGRAM (TEE) (N/A)   Subjective:  No new complaints.  Up in chair, asking when he will get to go home.    Objective: Vital signs in last 24 hours: Temp:  [97.4 F (36.3 C)-98.8 F (37.1 C)] 98.4 F (36.9 C) (05/19 0400) Pulse Rate:  [90-105] 105 (05/19 0400) Cardiac Rhythm: Sinus tachycardia (05/19 0721) Resp:  [12-20] 16 (05/19 0400) BP: (100-117)/(50-68) 105/62 (05/19 0400) SpO2:  [91 %-99 %] 92 % (05/19 0400) Weight:  [71.1 kg] 71.1 kg (05/19 0502)  Intake/Output from previous day: 05/18 0701 - 05/19 0700 In: 359.2 [P.O.:240; I.V.:119.2] Out: 1525 [Urine:1525]  General appearance: alert, cooperative and no distress Heart: regular rate and rhythm Lungs: clear to auscultation bilaterally Abdomen: soft, non-tender; bowel sounds normal; no masses,  no organomegaly Extremities: edema trace Wound: clean and dry  Lab Results: Recent Labs    12/02/19 0500 12/03/19 0500  WBC 6.4 6.5  HGB 9.6* 9.5*  HCT 30.8* 30.4*  PLT 264 319   BMET:  Recent Labs    12/02/19 1800 12/03/19 0500  NA 136 137  K 3.1* 2.6*  CL 99 97*  CO2 28 28  GLUCOSE 184* 182*  BUN 24* 23  CREATININE 1.29* 1.47*  CALCIUM 8.1* 8.2*    PT/INR: No results for input(s): LABPROT, INR in the last 72 hours. ABG    Component Value Date/Time   PHART 7.476 (H) 11/29/2019 0251   HCO3 20.1 11/29/2019 0251   TCO2 21 (L) 11/29/2019 0251   ACIDBASEDEF 2.0 11/29/2019 0251   O2SAT 46.2 12/03/2019 0523   CBG (last 3)  Recent Labs    11/30/19 1132 11/30/19 1534  GLUCAP 128* 146*    Assessment/Plan: S/P Procedure(s) (LRB): CORONARY ARTERY BYPASS GRAFTING (CABG) x3, using left internal mammary artery and Saphenous vein  harvested endoscopically (N/A) TRANSESOPHAGEAL ECHOCARDIOGRAM (TEE) (N/A)  1. CV- PAF, currently NSR with PACs- received Amiodarone bolus this morning, on Digoxin, remains on Milrinone at .375, Coox remains low at 46 2. Pulm- no acute issues, continue IS 3. Renal- creatinine at 1.47, on aggressive IV diuretics, and Spironolactone per AHF 4. Hypokalemia 2.6- IV replacement x 6 today, continue oral supplement 5. Dispo- patient stable, remains on milrinone which was increased yesterday, coox remains low, some brief episodes of A. Fib received Amiodarone bolus and is on digoxin, remains hypokalemic at 2.6, will supplement, watch creatinine, has trended up to 1.47, currently being aggressively diuresed, repeat BMET in AM   LOS: 6 days   Ellwood Handler, PA-C 12/03/2019  Clinically doing well but coox lagging prob due to completed preCABG DMI and postop PVC, afib Echo with preserved preop EF .35 w/o pericardial effusion or valve dz Med titration per Adv HF Team Start coumadin if afib persistent  patient examined and medical record reviewed,agree with above note. Tharon Aquas Trigt III 12/04/2019

## 2019-12-03 NOTE — Progress Notes (Signed)
CARDIAC REHAB PHASE I   PRE:  Rate/Rhythm: 92 SR    BP: sitting 107/67    SaO2: 89-94 RA  MODE:  Ambulation: 800 ft   POST:  Rate/Rhythm: 106 ST    BP: sitting 126/71     SaO2: 96 RA  Pt sts he is feeling well. Able to stand and walk with RW. No c/o, increased distance. Portable monitor not working so could not see rhythm much while walking. SaO2 96 RA. To bathroom then recliner. Wife present. Practiced IS, 1000 ml. [place], ACSM 12/03/2019 3:17 PM

## 2019-12-03 NOTE — Progress Notes (Signed)
  Amiodarone Drug - Drug Interaction Consult Note  Recommendations: Monitor for now.  Will check digoxin level in a few days.  Amiodarone is metabolized by the cytochrome P450 system and therefore has the potential to cause many drug interactions. Amiodarone has an average plasma half-life of 50 days (range 20 to 100 days).   There is potential for drug interactions to occur several weeks or months after stopping treatment and the onset of drug interactions may be slow after initiating amiodarone.   []  Statins: Increased risk of myopathy. Simvastatin- restrict dose to 20mg  daily. Other statins: counsel patients to report any muscle pain or weakness immediately.  []  Anticoagulants: Amiodarone can increase anticoagulant effect. Consider warfarin dose reduction. Patients should be monitored closely and the dose of anticoagulant altered accordingly, remembering that amiodarone levels take several weeks to stabilize.  []  Antiepileptics: Amiodarone can increase plasma concentration of phenytoin, the dose should be reduced. Note that small changes in phenytoin dose can result in large changes in levels. Monitor patient and counsel on signs of toxicity.  []  Beta blockers: increased risk of bradycardia, AV block and myocardial depression. Sotalol - avoid concomitant use.  []   Calcium channel blockers (diltiazem and verapamil): increased risk of bradycardia, AV block and myocardial depression.  []   Cyclosporine: Amiodarone increases levels of cyclosporine. Reduced dose of cyclosporine is recommended.  [x]  Digoxin dose should be halved when amiodarone is started.  []  Diuretics: increased risk of cardiotoxicity if hypokalemia occurs.  []  Oral hypoglycemic agents (glyburide, glipizide, glimepiride): increased risk of hypoglycemia. Patient's glucose levels should be monitored closely when initiating amiodarone therapy.   []  Drugs that prolong the QT interval:  Torsades de pointes risk may be increased  with concurrent use - avoid if possible.  Monitor QTc, also keep magnesium/potassium WNL if concurrent therapy can't be avoided. Marland Kitchen Antibiotics: e.g. fluoroquinolones, erythromycin. . Antiarrhythmics: e.g. quinidine, procainamide, disopyramide, sotalol. . Antipsychotics: e.g. phenothiazines, haloperidol.  . Lithium, tricyclic antidepressants, and methadone. Thank You,  Pat Patrick  12/03/2019 10:00 AM

## 2019-12-03 NOTE — Plan of Care (Signed)
  Problem: Education: Goal: Knowledge of General Education information will improve Description: Including pain rating scale, medication(s)/side effects and non-pharmacologic comfort measures Outcome: Progressing   Problem: Clinical Measurements: Goal: Will remain free from infection Outcome: Progressing Goal: Diagnostic test results will improve Outcome: Progressing   Problem: Activity: Goal: Risk for activity intolerance will decrease Outcome: Progressing   Problem: Coping: Goal: Level of anxiety will decrease Outcome: Progressing   Problem: Pain Managment: Goal: General experience of comfort will improve Outcome: Progressing

## 2019-12-03 NOTE — Plan of Care (Signed)

## 2019-12-04 ENCOUNTER — Encounter: Payer: Self-pay | Admitting: Podiatry

## 2019-12-04 LAB — COMPREHENSIVE METABOLIC PANEL
ALT: 22 U/L (ref 0–44)
AST: 15 U/L (ref 15–41)
Albumin: 2.7 g/dL — ABNORMAL LOW (ref 3.5–5.0)
Alkaline Phosphatase: 42 U/L (ref 38–126)
Anion gap: 9 (ref 5–15)
BUN: 21 mg/dL (ref 8–23)
CO2: 30 mmol/L (ref 22–32)
Calcium: 8.4 mg/dL — ABNORMAL LOW (ref 8.9–10.3)
Chloride: 97 mmol/L — ABNORMAL LOW (ref 98–111)
Creatinine, Ser: 1.33 mg/dL — ABNORMAL HIGH (ref 0.61–1.24)
Glucose, Bld: 166 mg/dL — ABNORMAL HIGH (ref 70–99)
Potassium: 2.8 mmol/L — ABNORMAL LOW (ref 3.5–5.1)
Sodium: 136 mmol/L (ref 135–145)
Total Bilirubin: 0.7 mg/dL (ref 0.3–1.2)
Total Protein: 5.8 g/dL — ABNORMAL LOW (ref 6.5–8.1)

## 2019-12-04 LAB — CBC
HCT: 30.3 % — ABNORMAL LOW (ref 39.0–52.0)
Hemoglobin: 9.3 g/dL — ABNORMAL LOW (ref 13.0–17.0)
MCH: 24.9 pg — ABNORMAL LOW (ref 26.0–34.0)
MCHC: 30.7 g/dL (ref 30.0–36.0)
MCV: 81 fL (ref 80.0–100.0)
Platelets: 359 10*3/uL (ref 150–400)
RBC: 3.74 MIL/uL — ABNORMAL LOW (ref 4.22–5.81)
RDW: 22.5 % — ABNORMAL HIGH (ref 11.5–15.5)
WBC: 6 10*3/uL (ref 4.0–10.5)
nRBC: 0.3 % — ABNORMAL HIGH (ref 0.0–0.2)

## 2019-12-04 LAB — GLUCOSE, CAPILLARY
Glucose-Capillary: 177 mg/dL — ABNORMAL HIGH (ref 70–99)
Glucose-Capillary: 152 mg/dL — ABNORMAL HIGH (ref 70–99)
Glucose-Capillary: 156 mg/dL — ABNORMAL HIGH (ref 70–99)
Glucose-Capillary: 157 mg/dL — ABNORMAL HIGH (ref 70–99)

## 2019-12-04 LAB — BASIC METABOLIC PANEL
Anion gap: 12 (ref 5–15)
BUN: 21 mg/dL (ref 8–23)
CO2: 27 mmol/L (ref 22–32)
Calcium: 7.9 mg/dL — ABNORMAL LOW (ref 8.9–10.3)
Chloride: 95 mmol/L — ABNORMAL LOW (ref 98–111)
Creatinine, Ser: 1.24 mg/dL (ref 0.61–1.24)
GFR calc Af Amer: >60 mL/min (ref >60)
GFR calc non Af Amer: 55 mL/min — ABNORMAL LOW (ref >60)
Glucose, Bld: 295 mg/dL — ABNORMAL HIGH (ref 70–99)
Potassium: 3.3 mmol/L — ABNORMAL LOW (ref 3.5–5.1)
Sodium: 134 mmol/L — ABNORMAL LOW (ref 135–145)

## 2019-12-04 LAB — MAGNESIUM: Magnesium: 2 mg/dL (ref 1.7–2.4)

## 2019-12-04 LAB — COOXEMETRY PANEL
Carboxyhemoglobin: 1.5 % (ref 0.5–1.5)
Methemoglobin: 1.4 % (ref 0.0–1.5)
O2 Saturation: 51.8 %
Total hemoglobin: 10.1 g/dL — ABNORMAL LOW (ref 12.0–16.0)

## 2019-12-04 MED ORDER — SPIRONOLACTONE 25 MG PO TABS
25.0000 mg | ORAL_TABLET | Freq: Every day | ORAL | Status: DC
  Administered 2019-12-04 – 2019-12-08 (×5): 25 mg via ORAL
  Filled 2019-12-04 (×5): qty 1

## 2019-12-04 MED ORDER — ASPIRIN EC 81 MG PO TBEC
81.0000 mg | DELAYED_RELEASE_TABLET | Freq: Every day | ORAL | Status: DC
  Administered 2019-12-05 – 2019-12-08 (×4): 81 mg via ORAL
  Filled 2019-12-04 (×4): qty 1

## 2019-12-04 MED ORDER — POTASSIUM CHLORIDE CRYS ER 20 MEQ PO TBCR
40.0000 meq | EXTENDED_RELEASE_TABLET | Freq: Three times a day (TID) | ORAL | Status: DC
  Administered 2019-12-04 (×3): 40 meq via ORAL
  Filled 2019-12-04 (×3): qty 2

## 2019-12-04 MED FILL — Sodium Bicarbonate IV Soln 8.4%: INTRAVENOUS | Qty: 50 | Status: AC

## 2019-12-04 MED FILL — Lidocaine HCl Local Soln Prefilled Syringe 100 MG/5ML (2%): INTRAMUSCULAR | Qty: 5 | Status: AC

## 2019-12-04 MED FILL — Heparin Sodium (Porcine) Inj 1000 Unit/ML: INTRAMUSCULAR | Qty: 20 | Status: AC

## 2019-12-04 MED FILL — Albumin, Human Inj 5%: INTRAVENOUS | Qty: 250 | Status: AC

## 2019-12-04 MED FILL — Calcium Chloride Inj 10%: INTRAVENOUS | Qty: 10 | Status: AC

## 2019-12-04 MED FILL — Electrolyte-R (PH 7.4) Solution: INTRAVENOUS | Qty: 6000 | Status: AC

## 2019-12-04 MED FILL — Sodium Chloride IV Soln 0.9%: INTRAVENOUS | Qty: 5000 | Status: AC

## 2019-12-04 NOTE — Progress Notes (Signed)
Pacer wires removed, pt  To remain in bed for the next hour.

## 2019-12-04 NOTE — Progress Notes (Signed)
CARDIAC REHAB PHASE I   PRE:  Rate/Rhythm: 90 SR with 1 PVC    BP: sitting 120/80    SaO2: 100 RA  MODE:  Ambulation: 800 ft   POST:  Rate/Rhythm: 105 ST    BP: sitting 133/80     SaO2: 97 RA  Ambulated independently with RW, I pushed IV pole. No c/o, HR stable. SAO2 higher today, he has been working on IS more now. To recliner, encouraged x2 more walks today. [id number]   [place], ACSM 12/04/2019 9:54 AM

## 2019-12-04 NOTE — Progress Notes (Addendum)
      [address]       [place] [address]             [phone]      6 Days Post-Op Procedure(s) (LRB): CORONARY ARTERY BYPASS GRAFTING (CABG) x3, using left internal mammary artery and Saphenous vein harvested endoscopically (N/A) TRANSESOPHAGEAL ECHOCARDIOGRAM (TEE) (N/A)   Subjective:  Up in chair, no complaints. Continues to feel well.  Objective: Vital signs in last 24 hours: Temp:  [97.3 F (36.3 C)-98.9 F (37.2 C)] 98.4 F (36.9 C) (05/20 0819) Pulse Rate:  [85-98] 92 (05/20 0819) Cardiac Rhythm: Normal sinus rhythm (05/20 0803) Resp:  [17-27] 27 (05/20 0819) BP: (109-129)/(62-69) 123/66 (05/20 0819) SpO2:  [93 %-98 %] 98 % (05/20 0819) Weight:  [71.7 kg] 71.7 kg (05/20 0359)  Intake/Output from previous day: 05/19 0701 - 05/20 0700 In: 1593.9 [P.O.:480; I.V.:624.6; IV Piggyback:489.3] Out: 1650 [Urine:1650]  General appearance: alert, cooperative and no distress Heart: regular rate and rhythm Lungs: clear to auscultation bilaterally Abdomen: soft, non-tender; bowel sounds normal; no masses,  no organomegaly Extremities: edema trace -1+ pitting Wound: clean and dry  Lab Results: Recent Labs    12/03/19 0500 12/04/19 0500  WBC 6.5 6.0  HGB 9.5* 9.3*  HCT 30.4* 30.3*  PLT 319 359   BMET:  Recent Labs    12/03/19 1800 12/04/19 0500  NA 132* 136  K 3.8 2.8*  CL 93* 97*  CO2 28 30  GLUCOSE 349* 166*  BUN 27* 21  CREATININE 1.48* 1.33*  CALCIUM 8.2* 8.4*    PT/INR: No results for input(s): LABPROT, INR in the last 72 hours. ABG    Component Value Date/Time   PHART 7.476 (H) 11/29/2019 0251   HCO3 20.1 11/29/2019 0251   TCO2 21 (L) 11/29/2019 0251   ACIDBASEDEF 2.0 11/29/2019 0251   O2SAT 51.8 12/04/2019 0520   CBG (last 3)  Recent Labs    12/03/19 1638 12/03/19 2102 12/04/19 0614  GLUCAP 194* 195* 177*    Assessment/Plan: S/P Procedure(s) (LRB): CORONARY ARTERY BYPASS GRAFTING (CABG) x3, using left internal  mammary artery and Saphenous vein harvested endoscopically (N/A) TRANSESOPHAGEAL ECHOCARDIOGRAM (TEE) (N/A)  1. CV- episodes of Atrial fibrillation, currently in NSR- on Amiodarone, Digoxin, and Milrinone at 0.375, Coox up to 51.8.Marland Kitchen will d/c EPW today.. will be okay to start Eliquis if needed this evening vs tomorrow 2. Pulm- no acute issues, continue IS 3. Renal- creatinine stable, diuresing well on Lasix, Spironolactone 4. Hypokalemia persists--will supplement again today 5. Dispo- patient stable, d/c EPW today, Milrinone wean per AHF, diuresing well, supplement K, continue current care   LOS: 7 days    Ellwood Handler, PA-C  12/04/2019 Remove epicardial pacing wires today Start low-dose Coumadin tomorrow if indicated with postoperative atrial fib, reduce aspirin to 81 mg Surgical incisions clean and dry, chest x-ray satisfactory  patient examined and medical record reviewed,agree with above note. Tharon Aquas Trigt III 12/04/2019

## 2019-12-04 NOTE — Progress Notes (Addendum)
Advanced Heart Failure Rounding Note  PCP-Cardiologist: Quay Burow, MD    Patient Profile   79 y/o male with h/o HTN, DM2, and HL admitted after outpatient cath showed severe CAD with EF 35%. Pre-op echo EF 40-45%  Underwent CABG x 3 on 5/14 which was uneventful. Post-op course c/b volume overload and low co-ox.   Repeat limited echo done 5/18. EF 35% with PVC dyssynchrony  with mild RV HK. No effusion  Subjective:    Noted to have frequent PVCs on tele 5/18. PO Amiodarone started.  blocker discontinued w/ low co-ox. Digoxin and spironolactone added.   Remains on milrinone 0.375. CO-OX 51%.    Yesterday switched to IV amiodarone for A Fib and PVCs. Back in NSR today.    Denies SOB. He has been walking down the hall  Without difficulty .   Objective:   Weight Range: 71.7 kg Body mass index is 24.76 kg/m.   Vital Signs:   Temp:  [97.3 F (36.3 C)-98.9 F (37.2 C)] 98.9 F (37.2 C) (05/20 0359) Pulse Rate:  [85-98] 89 (05/20 0359) Resp:  [17-21] 21 (05/20 0359) BP: (109-129)/(62-69) 124/69 (05/20 0359) SpO2:  [93 %-95 %] 94 % (05/20 0359) Weight:  [71.7 kg] 71.7 kg (05/20 0359) Last BM Date: 12/01/19  Weight change: Filed Weights   12/01/19 0500 12/03/19 0502 12/04/19 0359  Weight: 76.3 kg 71.1 kg 71.7 kg    Intake/Output:   Intake/Output Summary (Last 24 hours) at 12/04/2019 0729 Last data filed at 12/04/2019 0600 Gross per 24 hour  Intake 1593.9 ml  Output 1650 ml  Net -56.1 ml      Physical Exam   CVP 6-7  General:  Sitting in the chair. No resp difficulty HEENT: normal Neck: supple. JVP 6-7 . Carotids 2+ bilat; no bruits. No lymphadenopathy or thryomegaly appreciated. Cor: PMI nondisplaced. Regular rate & rhythm. No rubs, gallops or murmurs. Lungs: clear on room air.  Abdomen: soft, nontender, nondistended. No hepatosplenomegaly. No bruits or masses. Good bowel sounds. Extremities: no cyanosis, clubbing, rash, R and LLE trace edema.  RUE  PICC  Neuro: alert & orientedx3, cranial nerves grossly intact. moves all 4 extremities w/o difficulty. Affect pleasant   Telemetry   NSR 90-100s with occasional PVCs personally reviewed.   EKG    No new EKG to revies   Labs    CBC Recent Labs    12/03/19 0500 12/04/19 0500  WBC 6.5 6.0  HGB 9.5* 9.3*  HCT 30.4* 30.3*  MCV 81.1 81.0  PLT 319 [id number]   Basic Metabolic Panel Recent Labs    12/03/19 0500 12/03/19 0500 12/03/19 1800 12/04/19 0500  NA 137   < > 132* 136  K 2.6*   < > 3.8 2.8*  CL 97*   < > 93* 97*  CO2 28   < > 28 30  GLUCOSE 182*   < > 349* 166*  BUN 23   < > 27* 21  CREATININE 1.47*   < > 1.48* 1.33*  CALCIUM 8.2*   < > 8.2* 8.4*  MG 1.7  --   --  2.0   < > = values in this interval not displayed.   Liver Function Tests Recent Labs    12/03/19 0500 12/04/19 0500  AST 18 15  ALT 26 22  ALKPHOS 43 42  BILITOT 0.6 0.7  PROT 5.7* 5.8*  ALBUMIN 2.7* 2.7*   No results for input(s): LIPASE, AMYLASE in the last 72 hours. Cardiac  Enzymes No results for input(s): CKTOTAL, CKMB, CKMBINDEX, TROPONINI in the last 72 hours.  BNP: BNP (last 3 results) No results for input(s): BNP in the last 8760 hours.  ProBNP (last 3 results) No results for input(s): PROBNP in the last 8760 hours.   D-Dimer No results for input(s): DDIMER in the last 72 hours. Hemoglobin A1C No results for input(s): HGBA1C in the last 72 hours. Fasting Lipid Panel No results for input(s): CHOL, HDL, LDLCALC, TRIG, CHOLHDL, LDLDIRECT in the last 72 hours. Thyroid Function Tests No results for input(s): TSH, T4TOTAL, T3FREE, THYROIDAB in the last 72 hours.  Invalid input(s): FREET3  Other results:   Imaging    ECHOCARDIOGRAM LIMITED  Result Date: 12/03/2019    ECHOCARDIOGRAM LIMITED REPORT   Patient Name:   KENSEI DESRUISSEAUX Date of Exam: 12/03/2019 Medical Rec #:  [id number]      Height:       67.0 in Accession #:    [id number]     Weight:       156.7 lb Date of Birth:   21-Apr-1941      BSA:          1.823 m Patient Age:    73 years       BP:           105/62 mmHg Patient Gender: M              HR:           103 bpm. Exam Location:  Inpatient Procedure: 2D Echo Indications:    414.8 cardiomyopathy  History:        Patient has prior history of Echocardiogram examinations, most                 recent 11/28/2019. CAD, Prior CABG; Risk Factors:Hypertension,                 Dyslipidemia and Diabetes.  Sonographer:    Jannett Celestine RDCS (AE) Referring Phys: [place] TRIGT  Sonographer Comments: No subcostal window. limited IMPRESSIONS  1. Diffuse hypokinesis worse in the septum and apex with variability due to PVCls . Left ventricular ejection fraction, by estimation, is 35%. The left ventricular internal cavity size was mildly dilated.  2. Right ventricular systolic function is mildly reduced. The right ventricular size is mildly enlarged.  3. Trivial mitral valve regurgitation.  4. The aortic valve is tricuspid. Mild to moderate aortic valve sclerosis/calcification is present, without any evidence of aortic stenosis.  5. The inferior vena cava not visualized. FINDINGS  Left Ventricle: Diffuse hypokinesis worse in the septum and apex with variability due to PVCls. Left ventricular ejection fraction, by estimation, is 35%. The left ventricular internal cavity size was mildly dilated. Right Ventricle: The right ventricular size is mildly enlarged. Right vetricular wall thickness was not assessed. Right ventricular systolic function is mildly reduced. Left Atrium: Left atrial size was normal in size. Right Atrium: Right atrial size was normal in size. Pericardium: Trivial pericardial effusion is present. Mitral Valve: There is mild thickening of the mitral valve leaflet(s). There is mild calcification of the mitral valve leaflet(s). Moderate mitral annular calcification. Trivial mitral valve regurgitation. Tricuspid Valve: The tricuspid valve is normal in structure. Tricuspid valve  regurgitation is mild. Aortic Valve: The aortic valve is tricuspid. Mild to moderate aortic valve sclerosis/calcification is present, without any evidence of aortic stenosis. Venous: The inferior vena cava not visualized. IAS/Shunts: The interatrial septum was not well visualized.  AORTIC VALVE  LVOT Vmax:   85.00 cm/s LVOT Vmean:  64.600 cm/s LVOT VTI:    0.131 m  SHUNTS Systemic VTI: 0.13 m Jenkins Rouge MD Electronically signed by Jenkins Rouge MD Signature Date/Time: 12/03/2019/5:19:43 PM    Final      Medications:     Scheduled Medications: . aspirin EC  325 mg Oral Daily  . bisacodyl  10 mg Oral Daily   Or  . bisacodyl  10 mg Rectal Daily  . Chlorhexidine Gluconate Cloth  6 each Topical Daily  . digoxin  0.125 mg Oral Daily  . docusate sodium  200 mg Oral Daily  . finasteride  5 mg Oral QPM  . insulin aspart  0-9 Units Subcutaneous TID WC  . mouth rinse  15 mL Mouth Rinse BID  . pantoprazole  40 mg Oral Daily  . potassium chloride  40 mEq Oral TID  . sodium chloride flush  10-40 mL Intracatheter Q12H  . sodium chloride flush  3 mL Intravenous Q12H  . spironolactone  12.5 mg Oral Daily  . timolol  1 drop Both Eyes BID    Infusions: . sodium chloride    . sodium chloride Stopped (11/30/19 2023)  . amiodarone 30 mg/hr (12/04/19 0500)  . milrinone 0.4173 mcg/kg/min (12/04/19 0500)    PRN Medications: sodium chloride, ondansetron (ZOFRAN) IV, oxyCODONE, sodium chloride flush, sodium chloride flush, traMADol     Assessment/Plan   1. CAD - s/p CABG x 3 5/14 (LIMA-LAD, SVG-dig, SVG- PLB) - stable w/o CP  - continue ASA + statin  - hold ? blocker w/ shock   2. Post Cardiotomy Cardiogenic Shock - Day 5 post-op CABG - unable to wean off milrinone.  - CO-OX 51% on milrinone 0.375 mcg.  - In NSR.  - ? blocker has been discontinued - repeat limited echo shows LVEF ~35% (35-40% pre-op). No pericardial effusion. RV appears more dilated.  - Frequent PVCs may be contributing.    - Continue digoxin   3. Acute Systolic HF - Pre CABG LVEF 35-40%. 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 No effusion. RV appears mildly dilated  CO-OX 51% on 0.375 mcg. Qx calculate: Cardiac Output 4.2 Cardiac Index 2.3 - - Continue digoxin 0.125 qd - Increase spiro 25 mg to daily.   - BP too soft for ARB/ARNi  - no  blocker w/ low output   4. Hypokalemia - K 2.8. Increase KDUR to 40 meq three times a day. Check BMET at 1300  - Increase spiro 25 mg daily.    5. Post-operative Afib - Back in NSR with IV amio.  - given frequent recurrence + frequent PVCs,placed back on amio drip.  - keep K >4.0 and Mg >2.0  6. PVCs - frequent, ~20 hr on tele - Continue amio to suppress PVCs. -Replace K.  - Mag stable.   7. AKI - Scr stable 1.29>>1.47>>1.3      Length of Stay: 7  Amy Clegg, NP  12/04/2019, 7:29 AM  Advanced Heart Failure Team Pager [phone] (M-F; 7a - 4p)  Please contact [facility] for night-coverage after hours (4p -7a ) and weekends on [email or web address]  Patient seen and examined with the above-signed Advanced Practice Provider and/or Housestaff. I personally reviewed laboratory data, imaging studies and relevant notes. I independently examined the patient and formulated the important aspects of the plan. I have edited the note to reflect any of my changes or salient points. I have personally discussed the  plan with the patient and/or family.  Feeling better today. Remains on milrinone 0.375. Co-ox remains low at 51% PVCs suppressed with IV amio. No further AF,  General:  Well appearing. No resp difficulty HEENT: normal Neck: supple. no JVD. Carotids 2+ bilat; no bruits. No lymphadenopathy or thryomegaly appreciated. Cor: PMI nondisplaced. Regular rate & rhythm. No rubs, gallops or murmurs. Lungs: clear Abdomen: soft, nontender, nondistended. No hepatosplenomegaly. No bruits or masses. Good bowel sounds. Extremities: no cyanosis,  clubbing, rash, edema Neuro: alert & orientedx3, cranial nerves grossly intact. moves all 4 extremities w/o difficulty. Affect pleasant   Continue milrinone for RV support. Wean as tolerated, Continue IV amio while on milrinone. Renal function stable. Progress slowly. Supp K.   Glori Bickers, MD  8:47 PM

## 2019-12-05 LAB — GLUCOSE, CAPILLARY
Glucose-Capillary: 145 mg/dL — ABNORMAL HIGH (ref 70–99)
Glucose-Capillary: 143 mg/dL — ABNORMAL HIGH (ref 70–99)
Glucose-Capillary: 142 mg/dL — ABNORMAL HIGH (ref 70–99)
Glucose-Capillary: 154 mg/dL — ABNORMAL HIGH (ref 70–99)

## 2019-12-05 LAB — CBC
HCT: 32 % — ABNORMAL LOW (ref 39.0–52.0)
Hemoglobin: 9.8 g/dL — ABNORMAL LOW (ref 13.0–17.0)
MCH: 25 pg — ABNORMAL LOW (ref 26.0–34.0)
MCHC: 30.6 g/dL (ref 30.0–36.0)
MCV: 81.6 fL (ref 80.0–100.0)
Platelets: 435 10*3/uL — ABNORMAL HIGH (ref 150–400)
RBC: 3.92 MIL/uL — ABNORMAL LOW (ref 4.22–5.81)
RDW: 22.6 % — ABNORMAL HIGH (ref 11.5–15.5)
WBC: 6.1 10*3/uL (ref 4.0–10.5)
nRBC: 0 % (ref 0.0–0.2)

## 2019-12-05 LAB — COMPREHENSIVE METABOLIC PANEL
ALT: 23 U/L (ref 0–44)
AST: 17 U/L (ref 15–41)
Albumin: 2.8 g/dL — ABNORMAL LOW (ref 3.5–5.0)
Alkaline Phosphatase: 44 U/L (ref 38–126)
Anion gap: 10 (ref 5–15)
BUN: 17 mg/dL (ref 8–23)
CO2: 28 mmol/L (ref 22–32)
Calcium: 8.8 mg/dL — ABNORMAL LOW (ref 8.9–10.3)
Chloride: 102 mmol/L (ref 98–111)
Creatinine, Ser: 1.25 mg/dL — ABNORMAL HIGH (ref 0.61–1.24)
GFR calc Af Amer: >60 mL/min (ref >60)
GFR calc non Af Amer: 54 mL/min — ABNORMAL LOW (ref >60)
Glucose, Bld: 159 mg/dL — ABNORMAL HIGH (ref 70–99)
Potassium: 3.8 mmol/L (ref 3.5–5.1)
Sodium: 140 mmol/L (ref 135–145)
Total Bilirubin: 1 mg/dL (ref 0.3–1.2)
Total Protein: 6.1 g/dL — ABNORMAL LOW (ref 6.5–8.1)

## 2019-12-05 LAB — COOXEMETRY PANEL
Carboxyhemoglobin: 0.8 % (ref 0.5–1.5)
Carboxyhemoglobin: 1.4 % (ref 0.5–1.5)
Methemoglobin: 0.7 % (ref 0.0–1.5)
Methemoglobin: 0.9 % (ref 0.0–1.5)
O2 Saturation: 20.9 %
O2 Saturation: 57.8 %
Total hemoglobin: 9.9 g/dL — ABNORMAL LOW (ref 12.0–16.0)
Total hemoglobin: 15 g/dL (ref 12.0–16.0)

## 2019-12-05 LAB — MAGNESIUM: Magnesium: 1.9 mg/dL (ref 1.7–2.4)

## 2019-12-05 LAB — PROTIME-INR
INR: 1.1 (ref 0.8–1.2)
Prothrombin Time: 14.1 seconds (ref 11.4–15.2)

## 2019-12-05 MED ORDER — AMIODARONE HCL 200 MG PO TABS
200.0000 mg | ORAL_TABLET | Freq: Two times a day (BID) | ORAL | Status: DC
  Administered 2019-12-05 – 2019-12-08 (×7): 200 mg via ORAL
  Filled 2019-12-05 (×7): qty 1

## 2019-12-05 MED ORDER — FUROSEMIDE 40 MG PO TABS
40.0000 mg | ORAL_TABLET | Freq: Every day | ORAL | Status: DC
  Administered 2019-12-05 – 2019-12-07 (×3): 40 mg via ORAL
  Filled 2019-12-05 (×3): qty 1

## 2019-12-05 MED ORDER — POTASSIUM CHLORIDE CRYS ER 20 MEQ PO TBCR
40.0000 meq | EXTENDED_RELEASE_TABLET | Freq: Two times a day (BID) | ORAL | Status: DC
  Administered 2019-12-05 – 2019-12-08 (×7): 40 meq via ORAL
  Filled 2019-12-05 (×7): qty 2

## 2019-12-05 MED ORDER — APIXABAN 5 MG PO TABS
5.0000 mg | ORAL_TABLET | Freq: Two times a day (BID) | ORAL | Status: DC
  Administered 2019-12-05 – 2019-12-07 (×6): 5 mg via ORAL
  Filled 2019-12-05 (×6): qty 1

## 2019-12-05 MED ORDER — ALTEPLASE 2 MG IJ SOLR
2.0000 mg | Freq: Once | INTRAMUSCULAR | Status: AC
  Administered 2019-12-05: 2 mg
  Filled 2019-12-05: qty 2

## 2019-12-05 MED ORDER — INSULIN ASPART 100 UNIT/ML ~~LOC~~ SOLN
0.0000 [IU] | Freq: Three times a day (TID) | SUBCUTANEOUS | Status: DC
  Administered 2019-12-05 – 2019-12-07 (×8): 1 [IU] via SUBCUTANEOUS

## 2019-12-05 MED ORDER — GERHARDT'S BUTT CREAM
TOPICAL_CREAM | Freq: Three times a day (TID) | CUTANEOUS | Status: DC
  Filled 2019-12-05: qty 1

## 2019-12-05 MED ORDER — MAGNESIUM SULFATE 2 GM/50ML IV SOLN
2.0000 g | Freq: Once | INTRAVENOUS | Status: AC
  Administered 2019-12-05: 2 g via INTRAVENOUS
  Filled 2019-12-05: qty 50

## 2019-12-05 MED ORDER — LOSARTAN POTASSIUM 25 MG PO TABS
25.0000 mg | ORAL_TABLET | Freq: Every day | ORAL | Status: DC
  Administered 2019-12-05 – 2019-12-07 (×3): 25 mg via ORAL
  Filled 2019-12-05 (×3): qty 1

## 2019-12-05 NOTE — Progress Notes (Addendum)
      [address]       [place], [address]             [phone]      7 Days Post-Op Procedure(s) (LRB): CORONARY ARTERY BYPASS GRAFTING (CABG) x3, using left internal mammary artery and Saphenous vein harvested endoscopically (N/A) TRANSESOPHAGEAL ECHOCARDIOGRAM (TEE) (N/A)   Subjective:  Up in chair no complaints.  He states he is doing well.  He slept well last night.  Has ambulated in the hallway 2 laps.  Objective: Vital signs in last 24 hours: Temp:  [97.8 F (36.6 C)-99 F (37.2 C)] 98.4 F (36.9 C) (05/21 0348) Pulse Rate:  [84-96] 85 (05/21 0348) Cardiac Rhythm: Normal sinus rhythm (05/20 2100) Resp:  [15-28] 15 (05/21 0348) BP: (105-128)/(57-75) 128/75 (05/21 0348) SpO2:  [92 %-98 %] 95 % (05/21 0348) Weight:  [71.6 kg] 71.6 kg (05/21 0518)  Intake/Output from previous day: 05/20 0701 - 05/21 0700 In: 871.1 [P.O.:460; I.V.:411.1] Out: 1475 [id number]  General appearance: alert, cooperative and no distress Heart: regular rate and rhythm Lungs: clear to auscultation bilaterally Abdomen: soft, non-tender; bowel sounds normal; no masses,  no organomegaly Extremities: edema trace Wound: clean and dry  Lab Results: Recent Labs    12/04/19 0500 12/05/19 0718  WBC 6.0 6.1  HGB 9.3* 9.8*  HCT 30.3* 32.0*  PLT 359 435*   BMET:  Recent Labs    12/04/19 0500 12/04/19 1325  NA 136 134*  K 2.8* 3.3*  CL 97* 95*  CO2 30 27  GLUCOSE 166* 295*  BUN 21 21  CREATININE 1.33* 1.24  CALCIUM 8.4* 7.9*    PT/INR:  Recent Labs    12/05/19 0718  LABPROT 14.1  INR 1.1   ABG    Component Value Date/Time   PHART 7.476 (H) 11/29/2019 0251   HCO3 20.1 11/29/2019 0251   TCO2 21 (L) 11/29/2019 0251   ACIDBASEDEF 2.0 11/29/2019 0251   O2SAT 20.9 12/05/2019 0719   CBG (last 3)  Recent Labs    12/04/19 1559 12/04/19 2119 12/05/19 0602  GLUCAP 156* 157* 145*    Assessment/Plan: S/P Procedure(s) (LRB): CORONARY ARTERY BYPASS  GRAFTING (CABG) x3, using left internal mammary artery and Saphenous vein harvested endoscopically (N/A) TRANSESOPHAGEAL ECHOCARDIOGRAM (TEE) (N/A)  1. CV- PAF, currently NSR- on IV Amiodarone can likely transition to oral today, on Milrinone at 0.375, Digoxin... Coox not resulted as unable to draw blood from PICC this morning.  IV team to assess 2. Pulm- no acute issues, continue IS 3. Renal- creatinine stable, repeat level for today pending, K was improved yesterday afternoon will assess labs once resulted.. on Lasix, Spiro per AHF team 4. Disp- patient stable, IV team addressing issues with PICC, labs remain pending, EPW are out okay to start anticoagulation if indicated, continue current care   LOS: 8 days   coox improved  HF meds titrated by cardiology Maintaining nsr Milrinone>>>.25  patient examined and medical record reviewed,agree with above note. Tharon Aquas Trigt III 12/05/2019

## 2019-12-05 NOTE — Progress Notes (Signed)
Delay in morning labs due to being unable to pull back off PICC. IV team consult and will have lab come to collect.

## 2019-12-05 NOTE — Progress Notes (Signed)
CARDIAC REHAB PHASE I   PRE:  Rate/Rhythm: 90 NSR  BP:  Sitting:116/65  Standing:      SaO2: 100 RA  MODE:  Ambulation: 800 ft   POST:  Rate/Rhythm: 101 ST  BP:  Sitting: 147/77  Standing:      SaO2: 99 RA  0835 - 0918  Pt received in chair, agrees to ambulate. Voices having walked with RN this morning. IS encouraged 10x/hr. I/S = 1000 ml. Pt ambulated with steady gait using rolling walker. Pt denies any complaints with walk this morning. Pt back to chair with call bell in reach.    Lesly Rubenstein, MS, ACSM EP-C, [facility] 12/05/2019  9:11 AM

## 2019-12-05 NOTE — Progress Notes (Signed)
Advanced Heart Failure Rounding Note  PCP-Cardiologist: Quay Burow, MD    Patient Profile   79 y/o male with h/o HTN, DM2, and HL admitted after outpatient cath showed severe CAD with EF 35%. Pre-op echo EF 40-45%  Underwent CABG x 3 on 5/14 which was uneventful. Post-op course c/b volume overload and low co-ox.   Repeat limited echo done 5/18. EF 35% with PVC dyssynchrony  with mild RV HK. No effusion  Subjective:    Noted to have frequent PVCs on tele 5/18. PO Amiodarone started.  blocker discontinued w/ low co-ox. Digoxin and spironolactone added.   Remains on milrinone 0.375. CO-OX 58%. Feels ok. Denies SOB or CP. Able to ambulate halls. Maintaining NSR on IV amio. .    Objective:   Weight Range: 71.6 kg Body mass index is 24.72 kg/m.   Vital Signs:   Temp:  [98.4 F (36.9 C)-99 F (37.2 C)] 98.5 F (36.9 C) (05/21 1137) Pulse Rate:  [84-90] 84 (05/21 1137) Resp:  [15-28] 28 (05/21 1137) BP: (105-128)/(57-75) 121/74 (05/21 1137) SpO2:  [92 %-99 %] 99 % (05/21 1137) Weight:  [71.6 kg] 71.6 kg (05/21 0518) Last BM Date: 12/03/19  Weight change: Filed Weights   12/03/19 0502 12/04/19 0359 12/05/19 0518  Weight: 71.1 kg 71.7 kg 71.6 kg    Intake/Output:   Intake/Output Summary (Last 24 hours) at 12/05/2019 1205 Last data filed at 12/05/2019 1143 Gross per 24 hour  Intake 881.1 ml  Output 1725 ml  Net -843.9 ml      Physical Exam   General:  Well appearing. No resp difficulty HEENT: normal Neck: supple. no JVD. Carotids 2+ bilat; no bruits. No lymphadenopathy or thryomegaly appreciated. Cor: PMI nondisplaced. Regular rate & rhythm. No rubs, gallops or murmurs. Lungs: clear Abdomen: soft, nontender, nondistended. No hepatosplenomegaly. No bruits or masses. Good bowel sounds. Extremities: no cyanosis, clubbing, rash, edema Neuro: alert & orientedx3, cranial nerves grossly intact. moves all 4 extremities w/o difficulty. Affect  pleasant   Telemetry   NSR 80s Personally reviewed  Labs    CBC Recent Labs    12/04/19 0500 12/05/19 0718  WBC 6.0 6.1  HGB 9.3* 9.8*  HCT 30.3* 32.0*  MCV 81.0 81.6  PLT 359 [id number]*   Basic Metabolic Panel Recent Labs    12/04/19 0500 12/04/19 0500 12/04/19 1325 12/05/19 0718  NA 136   < > 134* 140  K 2.8*   < > 3.3* 3.8  CL 97*   < > 95* 102  CO2 30   < > 27 28  GLUCOSE 166*   < > 295* 159*  BUN 21   < > 21 17  CREATININE 1.33*   < > 1.24 1.25*  CALCIUM 8.4*   < > 7.9* 8.8*  MG 2.0  --   --  1.9   < > = values in this interval not displayed.   Liver Function Tests Recent Labs    12/04/19 0500 12/05/19 0718  AST 15 17  ALT 22 23  ALKPHOS 42 44  BILITOT 0.7 1.0  PROT 5.8* 6.1*  ALBUMIN 2.7* 2.8*   No results for input(s): LIPASE, AMYLASE in the last 72 hours. Cardiac Enzymes No results for input(s): CKTOTAL, CKMB, CKMBINDEX, TROPONINI in the last 72 hours.  BNP: BNP (last 3 results) No results for input(s): BNP in the last 8760 hours.  ProBNP (last 3 results) No results for input(s): PROBNP in the last 8760 hours.   D-Dimer No  results for input(s): DDIMER in the last 72 hours. Hemoglobin A1C No results for input(s): HGBA1C in the last 72 hours. Fasting Lipid Panel No results for input(s): CHOL, HDL, LDLCALC, TRIG, CHOLHDL, LDLDIRECT in the last 72 hours. Thyroid Function Tests No results for input(s): TSH, T4TOTAL, T3FREE, THYROIDAB in the last 72 hours.  Invalid input(s): FREET3  Other results:   Imaging    No results found.   Medications:     Scheduled Medications: . amiodarone  200 mg Oral BID  . apixaban  5 mg Oral BID  . aspirin EC  81 mg Oral Daily  . bisacodyl  10 mg Oral Daily   Or  . bisacodyl  10 mg Rectal Daily  . Chlorhexidine Gluconate Cloth  6 each Topical Daily  . digoxin  0.125 mg Oral Daily  . docusate sodium  200 mg Oral Daily  . finasteride  5 mg Oral QPM  . furosemide  40 mg Oral Daily  . insulin aspart   0-9 Units Subcutaneous TID AC & HS  . mouth rinse  15 mL Mouth Rinse BID  . pantoprazole  40 mg Oral Daily  . potassium chloride  40 mEq Oral BID  . sodium chloride flush  10-40 mL Intracatheter Q12H  . sodium chloride flush  3 mL Intravenous Q12H  . spironolactone  25 mg Oral Daily  . timolol  1 drop Both Eyes BID    Infusions: . milrinone 0.375 mcg/kg/min (12/04/19 1608)    PRN Medications: ondansetron (ZOFRAN) IV, oxyCODONE, sodium chloride flush, sodium chloride flush, traMADol     Assessment/Plan   1. CAD - s/p CABG x 3 5/14 (LIMA-LAD, SVG-dig, SVG- PLB) - no s/s angina - continue ASA + statin  - hold ? blocker w/ shock   2. Acute Systolic HF -> Post Cardiotomy Cardiogenic Shock - Day 6post-op CABG - CO-OX 58% on milrinone 0.375 mcg. Will wean to 0.25 - Remains in NSR.  - ? blocker has been discontinued - repeat limited echo shows LVEF ~35% (35-40% pre-op). No pericardial effusion. RV appears more dilated.  - Frequent PVCs may be contributing.  - Continue digoxin and spiro.  - Add losartan 25   3. Hypokalemia - K 3.8. Supp  4. Post-operative Afib - Back in NSR with IV amio.  - given frequent recurrence + frequent PVCs,placed back on amio drip.  - switched back to po by TCTS - keep K >4.0 and Mg >2.0  5. PVCs - frequent, ~20 hr on tele - Continue amio to suppress PVCs. -Replace K.  - Mag stable.   7. AKI - creatinine improved 1.25     Length of Stay: [place], MD  12/05/2019, 12:05 PM  Advanced Heart Failure Team Pager [phone] (M-F; 7a - 4p)  Please contact [facility] for night-coverage after hours (4p -7a ) and weekends on [email or web address]

## 2019-12-06 LAB — CBC
HCT: 28.6 % — ABNORMAL LOW (ref 39.0–52.0)
Hemoglobin: 8.8 g/dL — ABNORMAL LOW (ref 13.0–17.0)
MCH: 25.2 pg — ABNORMAL LOW (ref 26.0–34.0)
MCHC: 30.8 g/dL (ref 30.0–36.0)
MCV: 81.9 fL (ref 80.0–100.0)
Platelets: 410 10*3/uL — ABNORMAL HIGH (ref 150–400)
RBC: 3.49 MIL/uL — ABNORMAL LOW (ref 4.22–5.81)
RDW: 22.9 % — ABNORMAL HIGH (ref 11.5–15.5)
WBC: 6.3 10*3/uL (ref 4.0–10.5)
nRBC: 0 % (ref 0.0–0.2)

## 2019-12-06 LAB — PROTIME-INR
INR: 1.6 — ABNORMAL HIGH (ref 0.8–1.2)
Prothrombin Time: 18.5 seconds — ABNORMAL HIGH (ref 11.4–15.2)

## 2019-12-06 LAB — GLUCOSE, CAPILLARY
Glucose-Capillary: 134 mg/dL — ABNORMAL HIGH (ref 70–99)
Glucose-Capillary: 142 mg/dL — ABNORMAL HIGH (ref 70–99)
Glucose-Capillary: 135 mg/dL — ABNORMAL HIGH (ref 70–99)
Glucose-Capillary: 132 mg/dL — ABNORMAL HIGH (ref 70–99)

## 2019-12-06 LAB — BASIC METABOLIC PANEL
Anion gap: 8 (ref 5–15)
BUN: 15 mg/dL (ref 8–23)
CO2: 28 mmol/L (ref 22–32)
Calcium: 8.5 mg/dL — ABNORMAL LOW (ref 8.9–10.3)
Chloride: 104 mmol/L (ref 98–111)
Creatinine, Ser: 1.18 mg/dL (ref 0.61–1.24)
GFR calc Af Amer: >60 mL/min (ref >60)
GFR calc non Af Amer: 58 mL/min — ABNORMAL LOW (ref >60)
Glucose, Bld: 118 mg/dL — ABNORMAL HIGH (ref 70–99)
Potassium: 4 mmol/L (ref 3.5–5.1)
Sodium: 140 mmol/L (ref 135–145)

## 2019-12-06 LAB — COOXEMETRY PANEL
Carboxyhemoglobin: 1.7 % — ABNORMAL HIGH (ref 0.5–1.5)
Methemoglobin: 1.3 % (ref 0.0–1.5)
O2 Saturation: 53.6 %
Total hemoglobin: 9 g/dL — ABNORMAL LOW (ref 12.0–16.0)

## 2019-12-06 LAB — MAGNESIUM: Magnesium: 2 mg/dL (ref 1.7–2.4)

## 2019-12-06 MED ORDER — CITALOPRAM HYDROBROMIDE 20 MG PO TABS
10.0000 mg | ORAL_TABLET | Freq: Every day | ORAL | Status: DC
  Administered 2019-12-06 – 2019-12-07 (×2): 10 mg via ORAL
  Filled 2019-12-06 (×3): qty 1

## 2019-12-06 NOTE — Progress Notes (Signed)
Walked around 4:00 AM this morning in steady gait using front wheel walker. Pt tolerated 2 rounds without difficulty. V/S stable.

## 2019-12-06 NOTE — Progress Notes (Signed)
      [address]       [place] [address]             [phone]       8 Days Post-Op Procedure(s) (LRB): CORONARY ARTERY BYPASS GRAFTING (CABG) x3, using left internal mammary artery and Saphenous vein harvested endoscopically (N/A) TRANSESOPHAGEAL ECHOCARDIOGRAM (TEE) (N/A) Subjective: Sitting up in the chair. Having some minor soreness in chest incision, otherwise feels well. No shortness of breath. Appetite has not yet returned.  BM today.  Objective: Vital signs in last 24 hours: Temp:  [97.5 F (36.4 C)-98.6 F (37 C)] 98.2 F (36.8 C) (05/22 1103) Pulse Rate:  [83-92] 84 (05/22 1103) Cardiac Rhythm: Normal sinus rhythm (05/22 1200) Resp:  [14-26] 24 (05/22 1103) BP: (113-141)/(66-80) 118/76 (05/22 1103) SpO2:  [94 %-100 %] 100 % (05/22 1103) Weight:  [71.2 kg] 71.2 kg (05/22 0441)     Intake/Output from previous day: 05/21 0701 - 05/22 0700 In: 952.9 [P.O.:720; I.V.:232.9] Out: 1150 [Urine:1150] Intake/Output this shift: Total I/O In: 22.9 [I.V.:22.9] Out: 100 [Urine:100]  General appearance: alert, cooperative and no distress Neurologic: intact Heart: SR Lungs: Breath sounds are clear.  Extremities: Mild LE edema Wound: The sternotomy incision is intact and dry.   Lab Results: Recent Labs    12/05/19 0718 12/06/19 0348  WBC 6.1 6.3  HGB 9.8* 8.8*  HCT 32.0* 28.6*  PLT 435* 410*   BMET:  Recent Labs    12/05/19 0718 12/06/19 0348  NA 140 140  K 3.8 4.0  CL 102 104  CO2 28 28  GLUCOSE 159* 118*  BUN 17 15  CREATININE 1.25* 1.18  CALCIUM 8.8* 8.5*    PT/INR:  Recent Labs    12/06/19 0348  LABPROT 18.5*  INR 1.6*   ABG    Component Value Date/Time   PHART 7.476 (H) 11/29/2019 0251   HCO3 20.1 11/29/2019 0251   TCO2 21 (L) 11/29/2019 0251   ACIDBASEDEF 2.0 11/29/2019 0251   O2SAT 53.6 12/06/2019 0448   CBG (last 3)  Recent Labs    12/05/19 2101 12/06/19 0645 12/06/19 1106  GLUCAP 154* 134* 142*     Assessment/Plan: S/P Procedure(s) (LRB): CORONARY ARTERY BYPASS GRAFTING (CABG) x3, using left internal mammary artery and Saphenous vein harvested endoscopically (N/A) TRANSESOPHAGEAL ECHOCARDIOGRAM (TEE) (N/A)  -POD-8 CABGx 3 for MVCAD and acute systolic HF.  Pre-op EF 40-45%. On Milrinone at 0.66mcg/kg/min,  CoOx 53%. Mgt per HF.   -PAF- Currently is SR on oral amiodarone and digoxin.  -Chronic stage III renal disease- Stable creat  -Type 2 DM- mgt acceptable with SSI. Plan to resume oral medications at discharge.    LOS: 9 days    Antony Odea, [place] [phone] 12/06/2019

## 2019-12-06 NOTE — Progress Notes (Addendum)
Advanced Heart Failure Rounding Note  PCP-Cardiologist: Quay Burow, MD    Subjective:    Milrinone down to 0.125. Says he feels good. Slept well. Co-ox 56%.   No SOB. Remains in NSR  Objective:   Weight Range: 71.2 kg Body mass index is 24.58 kg/m.   Vital Signs:   Temp:  [97.5 F (36.4 C)-98.6 F (37 C)] 98.2 F (36.8 C) (05/22 1103) Pulse Rate:  [83-92] 84 (05/22 1103) Resp:  [14-26] 24 (05/22 1103) BP: (113-141)/(66-80) 118/76 (05/22 1103) SpO2:  [94 %-100 %] 100 % (05/22 1103) Weight:  [71.2 kg] 71.2 kg (05/22 0441) Last BM Date: 12/06/19  Weight change: Filed Weights   12/04/19 0359 12/05/19 0518 12/06/19 0441  Weight: 71.7 kg 71.6 kg 71.2 kg    Intake/Output:   Intake/Output Summary (Last 24 hours) at 12/06/2019 1430 Last data filed at 12/06/2019 1200 Gross per 24 hour  Intake 495.77 ml  Output 750 ml  Net -254.23 ml      Physical Exam   General:  Well appearing. No resp difficulty HEENT: normal Neck: supple. no JVD. Carotids 2+ bilat; no bruits. No lymphadenopathy or thryomegaly appreciated. Cor: PMI nondisplaced. Regular rate & rhythm. No rubs, gallops or murmurs. Lungs: clear Abdomen: soft, nontender, nondistended. No hepatosplenomegaly. No bruits or masses. Good bowel sounds. Extremities: no cyanosis, clubbing, rash, edema Neuro: alert & orientedx3, cranial nerves grossly intact. moves all 4 extremities w/o difficulty. Affect pleasant   Telemetry   NSR 80s Personally reviewed  Labs    CBC Recent Labs    12/05/19 0718 12/06/19 0348  WBC 6.1 6.3  HGB 9.8* 8.8*  HCT 32.0* 28.6*  MCV 81.6 81.9  PLT 435* 123XX123*   Basic Metabolic Panel Recent Labs    12/05/19 0718 12/06/19 0348  NA 140 140  K 3.8 4.0  CL 102 104  CO2 28 28  GLUCOSE 159* 118*  BUN 17 15  CREATININE 1.25* 1.18  CALCIUM 8.8* 8.5*  MG 1.9 2.0   Liver Function Tests Recent Labs    12/04/19 0500 12/05/19 0718  AST 15 17  ALT 22 23  ALKPHOS 42 44    BILITOT 0.7 1.0  PROT 5.8* 6.1*  ALBUMIN 2.7* 2.8*   No results for input(s): LIPASE, AMYLASE in the last 72 hours. Cardiac Enzymes No results for input(s): CKTOTAL, CKMB, CKMBINDEX, TROPONINI in the last 72 hours.  BNP: BNP (last 3 results) No results for input(s): BNP in the last 8760 hours.  ProBNP (last 3 results) No results for input(s): PROBNP in the last 8760 hours.   D-Dimer No results for input(s): DDIMER in the last 72 hours. Hemoglobin A1C No results for input(s): HGBA1C in the last 72 hours. Fasting Lipid Panel No results for input(s): CHOL, HDL, LDLCALC, TRIG, CHOLHDL, LDLDIRECT in the last 72 hours. Thyroid Function Tests No results for input(s): TSH, T4TOTAL, T3FREE, THYROIDAB in the last 72 hours.  Invalid input(s): FREET3  Other results:   Imaging    No results found.   Medications:     Scheduled Medications: . amiodarone  200 mg Oral BID  . apixaban  5 mg Oral BID  . aspirin EC  81 mg Oral Daily  . bisacodyl  10 mg Oral Daily   Or  . bisacodyl  10 mg Rectal Daily  . Chlorhexidine Gluconate Cloth  6 each Topical Daily  . citalopram  10 mg Oral Daily  . digoxin  0.125 mg Oral Daily  . docusate sodium  200 mg Oral Daily  . finasteride  5 mg Oral QPM  . furosemide  40 mg Oral Daily  . Gerhardt's butt cream   Topical TID  . insulin aspart  0-9 Units Subcutaneous TID AC & HS  . losartan  25 mg Oral Daily  . mouth rinse  15 mL Mouth Rinse BID  . pantoprazole  40 mg Oral Daily  . potassium chloride  40 mEq Oral BID  . spironolactone  25 mg Oral Daily  . timolol  1 drop Both Eyes BID    Infusions: . milrinone 0.25 mcg/kg/min (12/06/19 1200)    PRN Medications: ondansetron (ZOFRAN) IV, oxyCODONE, sodium chloride flush, sodium chloride flush, traMADol     Assessment/Plan   1. CAD - s/p CABG x 3 5/14 (LIMA-LAD, SVG-dig, SVG- PLB) - no s/s angina - continue ASA + statin  - hold ? blocker w/ shock   2. Acute Systolic HF -> Post  Cardiotomy Cardiogenic Shock - Day 9 post-op CABG - CO-OX 56% on milrinone 0.125 mcg. Stop milrinone - Remains in NSR.  - ? blocker has been discontinued - repeat limited echo shows LVEF ~35% (35-40% pre-op). No pericardial effusion. RV appears more dilated.  - Frequent PVCs may be contributing.  - Continue digoxin and spiro.  - Switch losartan to Entresto  3. Hypokalemia - K 4.7  4. Post-operative Afib - Remains in NSR on po amio  - keep K >4.0 and Mg >2.0 - On apixaban 5 bid  5. PVCs -  suppressed  6. AKI - creatinine improved   7. Post-op depression - started celexa  Stop milrinone. Hopefully home in am.   Length of Stay: 9  Glori Bickers, MD  12/06/2019, 2:30 PM  Advanced Heart Failure Team Pager [phone] (M-F; [place])  Please contact [facility] for night-coverage after hours (4p -7a ) and weekends on [email or web address]

## 2019-12-06 NOTE — Progress Notes (Signed)
CARDIAC REHAB PHASE I   PRE:  Rate/Rhythm: 89 SR  BP:  Sitting: 115/72        SaO2: 94 RA  MODE:  Ambulation: 800 ft   POST:  Rate/Rhythm: 99 SR  BP:  Sitting: 147/79        SaO2: 98 RA  1230 - 1254  Pt walked independently with RW. No complaints. Pt returned to chair with call bell within reach.   Philis Kendall, MS, ACSM CEP 12/06/2019 12:52 PM

## 2019-12-06 NOTE — Plan of Care (Signed)
  Problem: Education: Goal: Knowledge of General Education information will improve Description: Including pain rating scale, medication(s)/side effects and non-pharmacologic comfort measures Outcome: Progressing   Problem: Health Behavior/Discharge Planning: Goal: Ability to manage health-related needs will improve Outcome: Progressing   Problem: Clinical Measurements: Goal: Ability to maintain clinical measurements within normal limits will improve Outcome: Progressing Goal: Will remain free from infection Outcome: Progressing Goal: Diagnostic test results will improve Outcome: Progressing Goal: Respiratory complications will improve Outcome: Progressing Goal: Cardiovascular complication will be avoided Outcome: Progressing   Problem: Activity: Goal: Risk for activity intolerance will decrease Outcome: Progressing   Problem: Nutrition: Goal: Adequate nutrition will be maintained Outcome: Progressing   Problem: Coping: Goal: Level of anxiety will decrease Outcome: Progressing   Problem: Elimination: Goal: Will not experience complications related to bowel motility Outcome: Progressing Goal: Will not experience complications related to urinary retention Outcome: Progressing   Problem: Pain Managment: Goal: General experience of comfort will improve Outcome: Progressing   Problem: Safety: Goal: Ability to remain free from injury will improve Outcome: Progressing   Problem: Skin Integrity: Goal: Risk for impaired skin integrity will decrease Outcome: Progressing   Problem: Education: Goal: Will demonstrate proper wound care and an understanding of methods to prevent future damage Outcome: Progressing Goal: Knowledge of disease or condition will improve Outcome: Progressing Goal: Knowledge of the prescribed therapeutic regimen will improve Outcome: Progressing Goal: Individualized Educational Video(s) Outcome: Progressing   Problem: Activity: Goal: Risk for  activity intolerance will decrease Outcome: Progressing   Problem: Cardiac: Goal: Will achieve and/or maintain hemodynamic stability Outcome: Progressing   Problem: Clinical Measurements: Goal: Postoperative complications will be avoided or minimized Outcome: Progressing   Problem: Respiratory: Goal: Respiratory status will improve Outcome: Progressing   Problem: Skin Integrity: Goal: Wound healing without signs and symptoms of infection Outcome: Progressing Goal: Risk for impaired skin integrity will decrease Outcome: Progressing   Problem: Urinary Elimination: Goal: Ability to achieve and maintain adequate renal perfusion and functioning will improve Outcome: Progressing   Problem: Education: Goal: Required Educational Video(s) Outcome: Progressing   Problem: Clinical Measurements: Goal: Ability to maintain clinical measurements within normal limits will improve Outcome: Progressing Goal: Postoperative complications will be avoided or minimized Outcome: Progressing   Problem: Skin Integrity: Goal: Demonstration of wound healing without infection will improve Outcome: Progressing

## 2019-12-07 LAB — GLUCOSE, CAPILLARY
Glucose-Capillary: 114 mg/dL — ABNORMAL HIGH (ref 70–99)
Glucose-Capillary: 136 mg/dL — ABNORMAL HIGH (ref 70–99)
Glucose-Capillary: 141 mg/dL — ABNORMAL HIGH (ref 70–99)
Glucose-Capillary: 128 mg/dL — ABNORMAL HIGH (ref 70–99)

## 2019-12-07 LAB — COOXEMETRY PANEL
Carboxyhemoglobin: 1.4 % (ref 0.5–1.5)
Methemoglobin: 1.1 % (ref 0.0–1.5)
O2 Saturation: 56 %
Total hemoglobin: 14.1 g/dL (ref 12.0–16.0)

## 2019-12-07 LAB — BASIC METABOLIC PANEL
Anion gap: 6 (ref 5–15)
BUN: 20 mg/dL (ref 8–23)
CO2: 26 mmol/L (ref 22–32)
Calcium: 8.8 mg/dL — ABNORMAL LOW (ref 8.9–10.3)
Chloride: 105 mmol/L (ref 98–111)
Creatinine, Ser: 1.28 mg/dL — ABNORMAL HIGH (ref 0.61–1.24)
GFR calc Af Amer: >60 mL/min (ref >60)
GFR calc non Af Amer: 53 mL/min — ABNORMAL LOW (ref >60)
Glucose, Bld: 130 mg/dL — ABNORMAL HIGH (ref 70–99)
Potassium: 4.7 mmol/L (ref 3.5–5.1)
Sodium: 137 mmol/L (ref 135–145)

## 2019-12-07 LAB — PROTIME-INR
INR: 1.5 — ABNORMAL HIGH (ref 0.8–1.2)
Prothrombin Time: 17.5 seconds — ABNORMAL HIGH (ref 11.4–15.2)

## 2019-12-07 LAB — MAGNESIUM: Magnesium: 1.9 mg/dL (ref 1.7–2.4)

## 2019-12-07 LAB — DIGOXIN LEVEL: Digoxin Level: 0.6 ng/mL — ABNORMAL LOW (ref 0.8–2.0)

## 2019-12-07 MED ORDER — SACUBITRIL-VALSARTAN 24-26 MG PO TABS
1.0000 | ORAL_TABLET | Freq: Two times a day (BID) | ORAL | Status: DC
  Administered 2019-12-07 – 2019-12-08 (×3): 1 via ORAL
  Filled 2019-12-07 (×3): qty 1

## 2019-12-07 MED ORDER — MAGNESIUM SULFATE 2 GM/50ML IV SOLN
2.0000 g | Freq: Once | INTRAVENOUS | Status: AC
  Administered 2019-12-07: 2 g via INTRAVENOUS
  Filled 2019-12-07: qty 50

## 2019-12-07 NOTE — Plan of Care (Signed)
  Problem: Education: Goal: Knowledge of General Education information will improve Description: Including pain rating scale, medication(s)/side effects and non-pharmacologic comfort measures Outcome: Progressing   Problem: Health Behavior/Discharge Planning: Goal: Ability to manage health-related needs will improve Outcome: Progressing   Problem: Clinical Measurements: Goal: Ability to maintain clinical measurements within normal limits will improve Outcome: Progressing Goal: Will remain free from infection Outcome: Progressing Goal: Diagnostic test results will improve Outcome: Progressing Goal: Respiratory complications will improve Outcome: Progressing Goal: Cardiovascular complication will be avoided Outcome: Progressing   Problem: Activity: Goal: Risk for activity intolerance will decrease Outcome: Progressing   Problem: Nutrition: Goal: Adequate nutrition will be maintained Outcome: Progressing   Problem: Coping: Goal: Level of anxiety will decrease Outcome: Progressing   Problem: Elimination: Goal: Will not experience complications related to bowel motility Outcome: Progressing Goal: Will not experience complications related to urinary retention Outcome: Progressing   Problem: Pain Managment: Goal: General experience of comfort will improve Outcome: Progressing   Problem: Safety: Goal: Ability to remain free from injury will improve Outcome: Progressing   Problem: Skin Integrity: Goal: Risk for impaired skin integrity will decrease Outcome: Progressing   Problem: Education: Goal: Will demonstrate proper wound care and an understanding of methods to prevent future damage Outcome: Progressing Goal: Knowledge of disease or condition will improve Outcome: Progressing Goal: Knowledge of the prescribed therapeutic regimen will improve Outcome: Progressing Goal: Individualized Educational Video(s) Outcome: Progressing   Problem: Activity: Goal: Risk for  activity intolerance will decrease Outcome: Progressing   Problem: Cardiac: Goal: Will achieve and/or maintain hemodynamic stability Outcome: Progressing   Problem: Clinical Measurements: Goal: Postoperative complications will be avoided or minimized Outcome: Progressing   Problem: Respiratory: Goal: Respiratory status will improve Outcome: Progressing   Problem: Skin Integrity: Goal: Wound healing without signs and symptoms of infection Outcome: Progressing Goal: Risk for impaired skin integrity will decrease Outcome: Progressing   Problem: Urinary Elimination: Goal: Ability to achieve and maintain adequate renal perfusion and functioning will improve Outcome: Progressing   Problem: Education: Goal: Required Educational Video(s) Outcome: Progressing   Problem: Clinical Measurements: Goal: Ability to maintain clinical measurements within normal limits will improve Outcome: Progressing Goal: Postoperative complications will be avoided or minimized Outcome: Progressing   Problem: Skin Integrity: Goal: Demonstration of wound healing without infection will improve Outcome: Progressing

## 2019-12-07 NOTE — Progress Notes (Signed)
      [address]       [place] [address]             [phone]       9 Days Post-Op Procedure(s) (LRB): CORONARY ARTERY BYPASS GRAFTING (CABG) x3, using left internal mammary artery and Saphenous vein harvested endoscopically (N/A) TRANSESOPHAGEAL ECHOCARDIOGRAM (TEE) (N/A) Subjective: Up in the bedside chair, says he rested very well last night.  No new concerns this morning.   Objective: Vital signs in last 24 hours: Temp:  [97.6 F (36.4 C)-98.4 F (36.9 C)] 98.3 F (36.8 C) (05/23 0800) Pulse Rate:  [76-84] 76 (05/23 0800) Cardiac Rhythm: Normal sinus rhythm (05/23 0400) Resp:  [18-31] 20 (05/23 0800) BP: (118-135)/(69-78) 128/69 (05/23 0800) SpO2:  [96 %-100 %] 96 % (05/23 0800) Weight:  [70.9 kg] 70.9 kg (05/23 0524)    Intake/Output from previous day: 05/22 0701 - 05/23 0700 In: 300.3 [P.O.:240; I.V.:60.3] Out: 601 [Urine:600; Stool:1] Intake/Output this shift: No intake/output data recorded.  Physical Exam: General appearance: alert, cooperative and no distress Neurologic: intact Heart: SR Lungs: Breath sounds are clear.  Extremities: Mild LE edema.  RUE PICC, site ok. Wound: The sternotomy incision is intact and dry.   Lab Results: Recent Labs    12/05/19 0718 12/06/19 0348  WBC 6.1 6.3  HGB 9.8* 8.8*  HCT 32.0* 28.6*  PLT 435* 410*   BMET:  Recent Labs    12/06/19 0348 12/07/19 0525  NA 140 137  K 4.0 4.7  CL 104 105  CO2 28 26  GLUCOSE 118* 130*  BUN 15 20  CREATININE 1.18 1.28*  CALCIUM 8.5* 8.8*    PT/INR:  Recent Labs    12/07/19 0525  LABPROT 17.5*  INR 1.5*   ABG    Component Value Date/Time   PHART 7.476 (H) 11/29/2019 0251   HCO3 20.1 11/29/2019 0251   TCO2 21 (L) 11/29/2019 0251   ACIDBASEDEF 2.0 11/29/2019 0251   O2SAT 56.0 12/07/2019 0713   CBG (last 3)  Recent Labs    12/06/19 1631 12/06/19 2100 12/07/19 0610  GLUCAP 135* 132* 114*    Assessment/Plan: S/P Procedure(s)  (LRB): CORONARY ARTERY BYPASS GRAFTING (CABG) x3, using left internal mammary artery and Saphenous vein harvested endoscopically (N/A) TRANSESOPHAGEAL ECHOCARDIOGRAM (TEE) (N/A)  -POD-9 CABGx 3 for MVCAD and acute systolic HF.  Pre-op EF 40-45%. Milrinone decreased to 0.172mcg/kg/min yesterday,  CoOx 53% ->>56%.  Mgt per HF.   -PAF- Has remained in SR on oral amiodarone and digoxin.  -Chronic stage III renal disease- Slight increase in creat past 24 hours, K+4.7. No change in mgt., follow trend.   -Type 2 DM- mgt acceptable with SSI. Plan to resume oral medications at discharge.    LOS: 10 days    James Braun, [place] [phone] 12/07/2019

## 2019-12-08 LAB — BASIC METABOLIC PANEL
Anion gap: 9 (ref 5–15)
BUN: 22 mg/dL (ref 8–23)
CO2: 26 mmol/L (ref 22–32)
Calcium: 8.8 mg/dL — ABNORMAL LOW (ref 8.9–10.3)
Chloride: 100 mmol/L (ref 98–111)
Creatinine, Ser: 1.25 mg/dL — ABNORMAL HIGH (ref 0.61–1.24)
GFR calc Af Amer: >60 mL/min (ref >60)
GFR calc non Af Amer: 54 mL/min — ABNORMAL LOW (ref >60)
Glucose, Bld: 225 mg/dL — ABNORMAL HIGH (ref 70–99)
Potassium: 4.8 mmol/L (ref 3.5–5.1)
Sodium: 135 mmol/L (ref 135–145)

## 2019-12-08 LAB — GLUCOSE, CAPILLARY
Glucose-Capillary: 107 mg/dL — ABNORMAL HIGH (ref 70–99)
Glucose-Capillary: 128 mg/dL — ABNORMAL HIGH (ref 70–99)

## 2019-12-08 LAB — COOXEMETRY PANEL
Carboxyhemoglobin: 0.9 % (ref 0.5–1.5)
Methemoglobin: 0.7 % (ref 0.0–1.5)
O2 Saturation: 45.6 %
Total hemoglobin: 15.1 g/dL (ref 12.0–16.0)

## 2019-12-08 LAB — PROTIME-INR
INR: 1.6 — ABNORMAL HIGH (ref 0.8–1.2)
Prothrombin Time: 18.7 seconds — ABNORMAL HIGH (ref 11.4–15.2)

## 2019-12-08 LAB — MAGNESIUM: Magnesium: 2 mg/dL (ref 1.7–2.4)

## 2019-12-08 MED ORDER — APIXABAN 2.5 MG PO TABS: 2.5000 mg | ORAL_TABLET | Freq: Two times a day (BID) | ORAL | 1 refills | Status: DC

## 2019-12-08 MED ORDER — POTASSIUM CHLORIDE CRYS ER 20 MEQ PO TBCR: 40.0000 meq | EXTENDED_RELEASE_TABLET | Freq: Two times a day (BID) | ORAL | 1 refills | Status: DC

## 2019-12-08 MED ORDER — TRAMADOL HCL 50 MG PO TABS: 50.0000 mg | ORAL_TABLET | Freq: Every evening | ORAL | 0 refills | Status: DC | PRN

## 2019-12-08 MED ORDER — APIXABAN 2.5 MG PO TABS
2.5000 mg | ORAL_TABLET | Freq: Two times a day (BID) | ORAL | Status: DC
  Administered 2019-12-08: 2.5 mg via ORAL
  Filled 2019-12-08: qty 1

## 2019-12-08 MED ORDER — AMIODARONE HCL 200 MG PO TABS: 200.0000 mg | ORAL_TABLET | Freq: Every day | ORAL | 1 refills | Status: DC

## 2019-12-08 MED ORDER — ASPIRIN 81 MG PO TBEC: 81.0000 mg | DELAYED_RELEASE_TABLET | Freq: Every day | ORAL | Status: DC

## 2019-12-08 MED ORDER — CITALOPRAM HYDROBROMIDE 10 MG PO TABS: 10.0000 mg | ORAL_TABLET | Freq: Every day | ORAL | 1 refills | Status: DC

## 2019-12-08 MED ORDER — SPIRONOLACTONE 25 MG PO TABS: 25.0000 mg | ORAL_TABLET | Freq: Every day | ORAL | 1 refills | Status: DC

## 2019-12-08 MED ORDER — FUROSEMIDE 20 MG PO TABS
20.0000 mg | ORAL_TABLET | Freq: Every day | ORAL | 0 refills | Status: DC
Start: 2019-12-08 — End: 2020-01-06

## 2019-12-08 MED ORDER — DIGOXIN 125 MCG PO TABS: 0.1250 mg | ORAL_TABLET | Freq: Every day | ORAL | 1 refills | Status: DC

## 2019-12-08 MED ORDER — SACUBITRIL-VALSARTAN 24-26 MG PO TABS: 1.0000 | ORAL_TABLET | Freq: Two times a day (BID) | ORAL | 1 refills | Status: DC

## 2019-12-08 NOTE — Progress Notes (Addendum)
Advanced Heart Failure Rounding Note  PCP-Cardiologist: Quay Burow, MD    Subjective:    Milrinone stopped yesterday. No co-ox drawn today (will order)  He feels well. OOB sitting in chair. Pleased w/ his progress over the weekend. Denies dyspnea and CP. Back below preop wt.   Eager to go home.    Objective:   Weight Range: 68.8 kg Body mass index is 23.76 kg/m.   Vital Signs:   Temp:  [97.8 F (36.6 C)-98.9 F (37.2 C)] 97.8 F (36.6 C) (05/24 0348) Pulse Rate:  [64-80] 77 (05/24 0348) Resp:  [13-24] 24 (05/24 0400) BP: (122-131)/(69-96) 123/78 (05/24 0348) SpO2:  [94 %-98 %] 98 % (05/24 0348) Weight:  [68.8 kg] 68.8 kg (05/24 0500) Last BM Date: 12/06/19  Weight change: Filed Weights   12/06/19 0441 12/07/19 0524 12/08/19 0500  Weight: 71.2 kg 70.9 kg 68.8 kg    Intake/Output:   Intake/Output Summary (Last 24 hours) at 12/08/2019 0759 Last data filed at 12/08/2019 0000 Gross per 24 hour  Intake 820 ml  Output 1000 ml  Net -180 ml      Physical Exam   General:  Well appearing WM, sitting up in chair. No resp difficulty HEENT: normal Neck: supple.  JVD 6-7 cm. Carotids 2+ bilat; no bruits. No lymphadenopathy or thryomegaly appreciated. Cor: PMI nondisplaced. Regular rate & rhythm. No rubs, gallops or murmurs. Lungs: clear, no wheezing  Abdomen: soft, nontender, nondistended. No hepatosplenomegaly. No bruits or masses. Good bowel sounds. Extremities: no cyanosis, clubbing, rash, 1+ bilateral LEE up to knees, R>L + RUE PICC Neuro: alert & orientedx3, cranial nerves grossly intact. moves all 4 extremities w/o difficulty. Affect pleasant   Telemetry   NSR 80s Personally reviewed  Labs    CBC Recent Labs    12/06/19 0348  WBC 6.3  HGB 8.8*  HCT 28.6*  MCV 81.9  PLT 123XX123*   Basic Metabolic Panel Recent Labs    12/06/19 0348 12/06/19 0348 12/07/19 0525 12/08/19 0554  NA 140  --  137  --   K 4.0  --  4.7  --   CL 104  --  105  --     CO2 28  --  26  --   GLUCOSE 118*  --  130*  --   BUN 15  --  20  --   CREATININE 1.18  --  1.28*  --   CALCIUM 8.5*  --  8.8*  --   MG 2.0   < > 1.9 2.0   < > = values in this interval not displayed.   Liver Function Tests No results for input(s): AST, ALT, ALKPHOS, BILITOT, PROT, ALBUMIN in the last 72 hours. No results for input(s): LIPASE, AMYLASE in the last 72 hours. Cardiac Enzymes No results for input(s): CKTOTAL, CKMB, CKMBINDEX, TROPONINI in the last 72 hours.  BNP: BNP (last 3 results) No results for input(s): BNP in the last 8760 hours.  ProBNP (last 3 results) No results for input(s): PROBNP in the last 8760 hours.   D-Dimer No results for input(s): DDIMER in the last 72 hours. Hemoglobin A1C No results for input(s): HGBA1C in the last 72 hours. Fasting Lipid Panel No results for input(s): CHOL, HDL, LDLCALC, TRIG, CHOLHDL, LDLDIRECT in the last 72 hours. Thyroid Function Tests No results for input(s): TSH, T4TOTAL, T3FREE, THYROIDAB in the last 72 hours.  Invalid input(s): FREET3  Other results:   Imaging    No results found.  Medications:     Scheduled Medications: . amiodarone  200 mg Oral BID  . apixaban  2.5 mg Oral BID  . aspirin EC  81 mg Oral Daily  . bisacodyl  10 mg Oral Daily   Or  . bisacodyl  10 mg Rectal Daily  . Chlorhexidine Gluconate Cloth  6 each Topical Daily  . citalopram  10 mg Oral Daily  . digoxin  0.125 mg Oral Daily  . docusate sodium  200 mg Oral Daily  . finasteride  5 mg Oral QPM  . Gerhardt's butt cream   Topical TID  . insulin aspart  0-9 Units Subcutaneous TID AC & HS  . mouth rinse  15 mL Mouth Rinse BID  . pantoprazole  40 mg Oral Daily  . potassium chloride  40 mEq Oral BID  . sacubitril-valsartan  1 tablet Oral BID  . spironolactone  25 mg Oral Daily  . timolol  1 drop Both Eyes BID    Infusions:   PRN Medications: ondansetron (ZOFRAN) IV, oxyCODONE, sodium chloride flush, sodium chloride flush,  traMADol     Assessment/Plan   1. CAD - s/p CABG x 3 5/14 (LIMA-LAD, SVG-dig, SVG- PLB) - no s/s angina - continue ASA + statin  - hold ? blocker w/ shock   2. Acute Systolic HF -> Post Cardiotomy Cardiogenic Shock - Day 10 post-op CABG - Milrinone discontinued yesterday. Co-ox pending - Remains in NSR.  - ? blocker has been discontinued - repeat limited echo shows LVEF ~35% (35-40% pre-op). No pericardial effusion. RV appears more dilated.  - Frequent PVCs may be contributing.  - Continue digoxin and spiro.  - Continue Entresto 24-26 bid. Check BMP today   - may need PRN Lasix 20 mg for home  - will need to continue w/ compression stockings at discharge   3. Hypokalemia - BMP pending   4. Post-operative Afib - Remains in NSR on po amio  - keep K >4.0 and Mg >2.0 - On apixaban 5 bid  5. PVCs -  suppressed  6. AKI - creatinine overall improved but trending back up - repeat BMP    7. Post-op depression - started celexa  Plan likely d/c home today if co-ox stable  Cardiac Meds for discharge Amiodarone 200 mg bid x 10 days>>200 mg daily  Eliquis 2.5 mg bid ASA 81 mg daily  Digoxin 0.125 mg daily  KCL 40 mEq bid Entresto 24-26 bid Spironolactone 25 mg daily  Lasix 20 mg PRN   We will arrange f/u in [facility] and will place appt info in AVS       Length of Stay: [address] PA-C  12/08/2019, 7:59 AM  Advanced Heart Failure Team Pager [phone] (M-F; 7a - 4p)  Please contact [facility] for night-coverage after hours (4p -7a ) and weekends on [email or web address]  Patient seen and examined with the above-signed Advanced Practice Provider and/or Housestaff. I personally reviewed laboratory data, imaging studies and relevant notes. I independently examined the patient and formulated the important aspects of the plan. I have edited the note to reflect any of my changes or salient points. I have personally discussed the plan with the patient and/or  family.  He looks good today. Volume status good. Awaiting co-ox. Remains in NSR. PVCs suppressed. [place] for d/c from our perspective. Will arrange HF f/u. Has appt with Dr. [name] this Friday as well.   General:  Well appearing. No resp difficulty HEENT: normal Neck: supple. no JVD.  Carotids 2+ bilat; no bruits. No lymphadenopathy or thryomegaly appreciated. Cor: Incisions look good. PMI nondisplaced. Regular rate & rhythm. No rubs, gallops or murmurs. Lungs: clear Abdomen: soft, nontender, nondistended. No hepatosplenomegaly. No bruits or masses. Good bowel sounds. Extremities: no cyanosis, clubbing, rash, edema Neuro: alert & orientedx3, cranial nerves grossly intact. moves all 4 extremities w/o difficulty. Affect pleasant  Home today on above meds pending TCTS approval.  Glori Bickers, MD  8:45 AM

## 2019-12-08 NOTE — Progress Notes (Signed)
Discussed sternal precautions, IS, exercise, diet (low sodium), HF management, and CRPII. Pt and wife receptive, asking appropriate questions. Will refer to [place].  [place] CES, ACSM 12:00 PM 12/08/2019

## 2019-12-08 NOTE — Plan of Care (Signed)
  Problem: Clinical Measurements: Goal: Ability to maintain clinical measurements within normal limits will improve Outcome: Adequate for Discharge Goal: Diagnostic test results will improve Outcome: Adequate for Discharge Goal: Cardiovascular complication will be avoided Outcome: Adequate for Discharge   Problem: Nutrition: Goal: Adequate nutrition will be maintained Outcome: Adequate for Discharge   Problem: Coping: Goal: Level of anxiety will decrease Outcome: Adequate for Discharge   Problem: Skin Integrity: Goal: Risk for impaired skin integrity will decrease Outcome: Adequate for Discharge   Problem: Education: Goal: Will demonstrate proper wound care and an understanding of methods to prevent future damage Outcome: Adequate for Discharge Goal: Knowledge of disease or condition will improve Outcome: Adequate for Discharge   Problem: Activity: Goal: Risk for activity intolerance will decrease Outcome: Adequate for Discharge   Problem: Cardiac: Goal: Will achieve and/or maintain hemodynamic stability Outcome: Adequate for Discharge   Problem: Clinical Measurements: Goal: Postoperative complications will be avoided or minimized Outcome: Adequate for Discharge   Problem: Respiratory: Goal: Respiratory status will improve Outcome: Adequate for Discharge   Problem: Skin Integrity: Goal: Wound healing without signs and symptoms of infection Outcome: Adequate for Discharge Goal: Risk for impaired skin integrity will decrease Outcome: Adequate for Discharge   Problem: Education: Goal: Required Educational Video(s) Outcome: Adequate for Discharge   Problem: Clinical Measurements: Goal: Ability to maintain clinical measurements within normal limits will improve Outcome: Adequate for Discharge Goal: Postoperative complications will be avoided or minimized Outcome: Adequate for Discharge   Problem: Skin Integrity: Goal: Demonstration of wound healing without  infection will improve Outcome: Adequate for Discharge   Problem: Education: Goal: Knowledge of General Education information will improve Description: Including pain rating scale, medication(s)/side effects and non-pharmacologic comfort measures Outcome: Completed/Met   Problem: Health Behavior/Discharge Planning: Goal: Ability to manage health-related needs will improve Outcome: Completed/Met   Problem: Clinical Measurements: Goal: Will remain free from infection Outcome: Completed/Met   Problem: Activity: Goal: Risk for activity intolerance will decrease Outcome: Completed/Met   Problem: Elimination: Goal: Will not experience complications related to bowel motility Outcome: Completed/Met Goal: Will not experience complications related to urinary retention Outcome: Completed/Met   Problem: Pain Managment: Goal: General experience of comfort will improve Outcome: Completed/Met   Problem: Safety: Goal: Ability to remain free from injury will improve Outcome: Completed/Met   Problem: Education: Goal: Knowledge of the prescribed therapeutic regimen will improve Outcome: Completed/Met Goal: Individualized Educational Video(s) Outcome: Completed/Met   Problem: Urinary Elimination: Goal: Ability to achieve and maintain adequate renal perfusion and functioning will improve Outcome: Completed/Met

## 2019-12-08 NOTE — Care Management (Signed)
Per Chyrel Masson. [facility],  Co-pay amount for Eliquis 2.5mg  and or 5mg .bid,$45.00,for a 30 day supply. Co-pay amount for Entresto 24-26 mg.bid $45.00 for a 30 day supply. (PA required for Entresto 24-26mg . @ [phone])  No PA required for Eliquis No Deductible Tier 3 Retail  pharmacy: [facility]  Mail order pharmacy Eliquis 2.5mg . and or 5mg . and Entresto 24-26mg . 90 day supply  $90.00 PA required for Entresto@ [phone].  [id number]

## 2019-12-08 NOTE — Progress Notes (Signed)
      [address]       [place] [address]             [phone]      10 Days Post-Op Procedure(s) (LRB): CORONARY ARTERY BYPASS GRAFTING (CABG) x3, using left internal mammary artery and Saphenous vein harvested endoscopically (N/A) TRANSESOPHAGEAL ECHOCARDIOGRAM (TEE) (N/A) Subjective: Feels okay this afternoon. Anxious to be discharged.   Objective: Vital signs in last 24 hours: Temp:  [97.7 F (36.5 C)-98.9 F (37.2 C)] 97.7 F (36.5 C) (05/24 0810) Pulse Rate:  [64-81] 81 (05/24 0810) Cardiac Rhythm: Normal sinus rhythm;Bundle branch block (05/24 0927) Resp:  [13-24] 18 (05/24 0810) BP: (117-131)/(70-96) 117/77 (05/24 0810) SpO2:  [94 %-100 %] 100 % (05/24 0810) Weight:  [68.8 kg] 68.8 kg (05/24 0500)  Hemodynamic parameters for last 24 hours:    Intake/Output from previous day: 05/23 0701 - 05/24 0700 In: 820 [P.O.:820] Out: 1000 [Urine:1000] Intake/Output this shift: Total I/O In: 120 [P.O.:120] Out: -   General appearance: alert, cooperative and no distress Heart: regular rate and rhythm, S1, S2 normal, no murmur, click, rub or gallop Lungs: clear to auscultation bilaterally Abdomen: soft, non-tender; bowel sounds normal; no masses,  no organomegaly Extremities: 1-2+ pitting edema in bilateral lower ext Wound: clean and dry  Lab Results: Recent Labs    12/06/19 0348  WBC 6.3  HGB 8.8*  HCT 28.6*  PLT 410*   BMET:  Recent Labs    12/07/19 0525 12/08/19 0820  NA 137 135  K 4.7 4.8  CL 105 100  CO2 26 26  GLUCOSE 130* 225*  BUN 20 22  CREATININE 1.28* 1.25*  CALCIUM 8.8* 8.8*    PT/INR:  Recent Labs    12/08/19 0554  LABPROT 18.7*  INR 1.6*   ABG    Component Value Date/Time   PHART 7.476 (H) 11/29/2019 0251   HCO3 20.1 11/29/2019 0251   TCO2 21 (L) 11/29/2019 0251   ACIDBASEDEF 2.0 11/29/2019 0251   O2SAT 45.6 12/08/2019 0820   CBG (last 3)  Recent Labs    12/07/19 2110 12/08/19 0552 12/08/19 1116    GLUCAP 128* 107* 128*    Assessment/Plan: S/P Procedure(s) (LRB): CORONARY ARTERY BYPASS GRAFTING (CABG) x3, using left internal mammary artery and Saphenous vein harvested endoscopically (N/A) TRANSESOPHAGEAL ECHOCARDIOGRAM (TEE) (N/A)  POD-9 CABGx 3 for MVCAD and acute systolic HF. Pre-op EF 40-45%. Milrinone discontinued. CoOx 45.6%.  Mgt per HF.   CV-PAF- Has remained in NSR in the 70s on oral amiodarone and digoxin.  Pulm-tolerating room air without issue.  Chronic stage III renal disease- creatinine 1.25, K+4.8. No change in mgt., follow trend.   Type 2 DM- mgt acceptable with SSI. Plan to resume oral medications at discharge.  Plan: Okay to d/c from heart failure's perspective-spoke with Dr. [name] and he is okay with discharge. Will order a walker for the patient. PICC team called to remove line.      LOS: 11 days    James Braun 12/08/2019

## 2019-12-08 NOTE — TOC Transition Note (Signed)
Transition of Care [facility]) - CM/SW Discharge Note   Patient Details  Name: James Braun MRN: [id number] Date of Birth: 08-29-1940  [facility] CM/SW Contact:  Zenon Mayo, RN Phone Number: 12/08/2019, 12:51 PM   Clinical Narrative:    Patient is for dc today, NCM gave patient the 30 day free entresto coupon, and he has the 30 day free eliquis coupon.  He would like the rolling walker to be delivered to his home.  Zach with [employer] will facilitate this.   Final next level of care: Home/Self Care Barriers to Discharge: No Barriers Identified   Patient Goals and CMS Choice Patient states their goals for this hospitalization and ongoing recovery are:: get better   Choice offered to / list presented to : NA  Discharge Placement                       Discharge Plan and Services                DME Arranged: Walker rolling DME Agency: [employer] Date DME Agency Contacted: 12/08/19 Time DME Agency Contacted: [id number] Representative spoke with at DME Agency: [place]: NA          Social Determinants of Health ([place]) Interventions     Readmission Risk Interventions No flowsheet data found.

## 2019-12-08 NOTE — Progress Notes (Signed)
Patient discharge instructions reviewed with wife and patient and both verbally acknowledged understanding.  Written copy with highlighted important information available.  Patient via wheelchair to wife's waiting car in stable condition.

## 2019-12-09 ENCOUNTER — Telehealth: Payer: Self-pay

## 2019-12-09 NOTE — Telephone Encounter (Signed)
Transition Care Management Follow-up Telephone Call  Date of discharge and from where: 12/08/2019, James Braun  How have you been since you were released from the hospital? Patient states that he is very tired and sore but overall feeling better.  Any questions or concerns? No   Items Reviewed:  Did the pt receive and understand the discharge instructions provided? Yes   Medications obtained and verified? Yes   Any new allergies since your discharge? No   Dietary orders reviewed? Yes  Do you have support at home? Yes   Functional Questionnaire: (I = Independent and D = Dependent) ADLs: I  Bathing/Dressing- I  Meal Prep- I  Eating- I  Maintaining continence- I  Transferring/Ambulation- I  Managing Meds- I  Follow up appointments reviewed:   PCP Hospital f/u appt confirmed? No  Patient states that he is under the care of cardiology for the next 30 days and feels confident with just following up with them. He will call if he needs Dr. [name]. [facility] f/u appt confirmed? Yes  Scheduled to see cardiology  Are transportation arrangements needed? No   If their condition worsens, is the pt aware to call PCP or go to the Emergency Dept.? Yes  Was the patient provided with contact information for the PCP's office or ED? Yes  Was to pt encouraged to call back with questions or concerns? Yes

## 2019-12-10 DIAGNOSIS — I5021 Acute systolic (congestive) heart failure: Secondary | ICD-10-CM | POA: Diagnosis not present

## 2019-12-10 NOTE — Telephone Encounter (Signed)
It is fine if he just keeps his cardiology/surgery follow ups

## 2019-12-11 ENCOUNTER — Telehealth: Payer: Self-pay | Admitting: Podiatry

## 2019-12-11 ENCOUNTER — Other Ambulatory Visit: Payer: Self-pay

## 2019-12-11 ENCOUNTER — Telehealth (HOSPITAL_COMMUNITY): Payer: Self-pay | Admitting: *Deleted

## 2019-12-11 ENCOUNTER — Ambulatory Visit: Payer: PPO | Admitting: Podiatry

## 2019-12-11 ENCOUNTER — Ambulatory Visit (INDEPENDENT_AMBULATORY_CARE_PROVIDER_SITE_OTHER): Payer: Self-pay

## 2019-12-11 DIAGNOSIS — Z951 Presence of aortocoronary bypass graft: Secondary | ICD-10-CM

## 2019-12-11 DIAGNOSIS — Z4802 Encounter for removal of sutures: Secondary | ICD-10-CM

## 2019-12-11 NOTE — Telephone Encounter (Signed)
Switch lasix to MWF only

## 2019-12-11 NOTE — Telephone Encounter (Signed)
Pt left VM stating he is down 10lbs from his normal weight since increasing diuretic. I called pt back and spoke with his wife she said pts swelling is also down but he c/o weakness. Pt wanted to know if any medications needed to be adjusted before his appt on 6/22.   Routed to [place] for advice

## 2019-12-11 NOTE — Telephone Encounter (Signed)
Pt left a message stating that he wanted to cx his appointment on 12/11/2019. I have r/s him for 01/08/2020.

## 2019-12-11 NOTE — Progress Notes (Signed)
Removed 3 sutures from chest tube incision sites with no signs of infection and patient tolerated well.  

## 2019-12-12 ENCOUNTER — Telehealth (HOSPITAL_COMMUNITY): Payer: Self-pay | Admitting: Pharmacy Technician

## 2019-12-12 ENCOUNTER — Ambulatory Visit: Payer: PPO | Admitting: Cardiovascular Disease

## 2019-12-12 NOTE — Telephone Encounter (Signed)
Pt's wife aware.

## 2019-12-12 NOTE — Telephone Encounter (Signed)
Spoke to patient and Holley Raring, patient's wife. They are aware Delene Loll co-pay is $45. PAN grant is open right they but they have opted not to apply for it at this time. Patient is aware that this may not be open in the future.  Charlann Boxer, CPhT

## 2019-12-15 ENCOUNTER — Telehealth: Payer: Self-pay | Admitting: Physician Assistant

## 2019-12-15 NOTE — Telephone Encounter (Signed)
Paged by answering service. Patient had non bloody diarrhea after discharge while taking Dulcolex. Diarrhea resolved however this morning he noted "back stool". He is asymptomatic. He required transfusion while admitted.   I have advised to hold Eliquis 1-2 days. Call office tomorrow to check CBC. If abdominal pain or blood in stool he will call us soon or go to ER. He is agree with plan and appreciative of call.

## 2019-12-16 ENCOUNTER — Ambulatory Visit (INDEPENDENT_AMBULATORY_CARE_PROVIDER_SITE_OTHER): Payer: PPO | Admitting: Internal Medicine

## 2019-12-16 ENCOUNTER — Other Ambulatory Visit: Payer: Self-pay

## 2019-12-16 ENCOUNTER — Telehealth: Payer: Self-pay

## 2019-12-16 ENCOUNTER — Encounter: Payer: Self-pay | Admitting: Internal Medicine

## 2019-12-16 VITALS — BP 102/60 | HR 84 | Temp 97.5°F | Ht 66.0 in | Wt 141.0 lb

## 2019-12-16 DIAGNOSIS — K921 Melena: Secondary | ICD-10-CM | POA: Diagnosis not present

## 2019-12-16 DIAGNOSIS — K922 Gastrointestinal hemorrhage, unspecified: Secondary | ICD-10-CM | POA: Insufficient documentation

## 2019-12-16 DIAGNOSIS — I5022 Chronic systolic (congestive) heart failure: Secondary | ICD-10-CM | POA: Diagnosis not present

## 2019-12-16 DIAGNOSIS — I480 Paroxysmal atrial fibrillation: Secondary | ICD-10-CM

## 2019-12-16 DIAGNOSIS — E441 Mild protein-calorie malnutrition: Secondary | ICD-10-CM | POA: Diagnosis not present

## 2019-12-16 LAB — RENAL FUNCTION PANEL
Albumin: 4.2 g/dL (ref 3.5–5.2)
BUN: 31 mg/dL — ABNORMAL HIGH (ref 6–23)
CO2: 27 mEq/L (ref 19–32)
Calcium: 10.1 mg/dL (ref 8.4–10.5)
Chloride: 99 mEq/L (ref 96–112)
Creatinine, Ser: 1.43 mg/dL (ref 0.40–1.50)
GFR: 47.61 mL/min — ABNORMAL LOW (ref >60.00)
Glucose, Bld: 136 mg/dL — ABNORMAL HIGH (ref 70–99)
Phosphorus: 3.7 mg/dL (ref 2.3–4.6)
Potassium: 5.1 mEq/L (ref 3.5–5.1)
Sodium: 134 mEq/L — ABNORMAL LOW (ref 135–145)

## 2019-12-16 LAB — CBC
HCT: 34.9 % — ABNORMAL LOW (ref 39.0–52.0)
Hemoglobin: 11.4 g/dL — ABNORMAL LOW (ref 13.0–17.0)
MCHC: 32.5 g/dL (ref 30.0–36.0)
MCV: 79.4 fl (ref 78.0–100.0)
Platelets: 775 10*3/uL — ABNORMAL HIGH (ref 150.0–400.0)
RBC: 4.4 Mil/uL (ref 4.22–5.81)
RDW: 24.1 % — ABNORMAL HIGH (ref 11.5–15.5)
WBC: 8 10*3/uL (ref 4.0–10.5)

## 2019-12-16 NOTE — Patient Instructions (Signed)
Please stop the eliquis. Continue the aspirin 81mg  daily Please start omeprazole 20mg  daily on an empty stomach until further notice

## 2019-12-16 NOTE — Telephone Encounter (Signed)
Patient scheduled appointment today at noon.

## 2019-12-16 NOTE — Assessment & Plan Note (Signed)
Some melena but no stools today Clearly doesn't seem to have a large volume blood loss Stopped the eliquis after yesterday AM dose  Will have him stay off eliquis Empiric omeprazole daily Would consider elective colonoscopy if doing well in 3-6 months

## 2019-12-16 NOTE — Assessment & Plan Note (Signed)
Has lost 15# post op Discussed eating and using supplements like ensure/boost

## 2019-12-16 NOTE — Telephone Encounter (Signed)
Pt's wife called office to report that pt has black stools. He is s/p CABG by Dr. [name] on 11/28/19. Instructed to contact PCP regarding this concern and encouraged to call [facility] if pt has any surgery-related issues.

## 2019-12-16 NOTE — Assessment & Plan Note (Signed)
New diagnosis post CABG His overall condition has improved with better stamina, less DOE, etc Continue diuretics and entresto

## 2019-12-16 NOTE — Telephone Encounter (Signed)
Please have him come in at noon today ---if he can make it

## 2019-12-16 NOTE — Telephone Encounter (Signed)
Patient contacted the office and states that he has open heart surgery on 11/28/19. He states he was given Eliquis also during this time. Patient states he started having black stools over this weekend. He contacted his cardiologist and they advised him to stop taking the Eliquis, but to follow up with his primary care office. I attempted to send patient to triage, but patient refused and stated he just wanted to schedule an appt with Dr. [name].  Wife was also in the background stating patient is dehydrated and needs to be seen. I advised patient that with the black stools, and the dehydration, there is not much we can do here in the office if he needs fluids, or the capability to do scans to rule out a GI bleed. Patient then got irritated and stated to just schedule him an appt because Dr. [name] will know what to do.  Will route to [facility] and Dr. [name].

## 2019-12-16 NOTE — Progress Notes (Signed)
Subjective:    Patient ID: James Braun, male    DOB: 12-19-1940, 79 y.o.   MRN: [id number]  HPI Here due to black stools---with wife ~3 weeks post CABG with atrial fib and CHF after This visit occurred during the SARS-CoV-2 public health emergency.  Safety protocols were in place, including screening questions prior to the visit, additional usage of staff PPE, and extensive cleaning of exam room while observing appropriate contact time as indicated for disinfecting solutions.   He had seemed to be improving at home Felt his balance was improving Breathing has vastly improved No palpitations  He noticed black stools yesterday Taking stool softeners and dulcolax--he stopped this since yesterday None since yesterday  No abdominal pain No N/V Eating okay---appetite only slowly improving  Current Outpatient Medications on File Prior to Visit  Medication Sig Dispense Refill  . alum hydroxide-mag trisilicate (GAVISCON) [id number] MG CHEW chewable tablet Chew 2 tablets by mouth 3 (three) times daily as needed for indigestion or heartburn.    Marland Kitchen amiodarone (PACERONE) 200 MG tablet Take 1 tablet (200 mg total) by mouth daily. 30 tablet 1  . aspirin EC 81 MG EC tablet Take 1 tablet (81 mg total) by mouth daily.    . Cyanocobalamin (VITAMIN B-12) 1000 MCG SUBL Place under the tongue.    . digoxin (LANOXIN) 0.125 MG tablet Take 1 tablet (0.125 mg total) by mouth daily. 30 tablet 1  . finasteride (PROSCAR) 5 MG tablet Take 1 tablet (5 mg total) by mouth daily. 90 tablet 3  . metFORMIN (GLUCOPHAGE-XR) 500 MG 24 hr tablet Take 1 tablet (500 mg total) by mouth 2 (two) times daily. 180 tablet 3  . Multiple Vitamin (MULTIVITAMIN) tablet Take 1 tablet by mouth daily.    . Potassium Citrate 15 MEQ (1620 MG) TBCR Take 1 tablet by mouth in the morning and at bedtime.    . sacubitril-valsartan (ENTRESTO) 24-26 MG Take 1 tablet by mouth 2 (two) times daily. 60 tablet 1  . spironolactone (ALDACTONE) 25 MG  tablet Take 1 tablet (25 mg total) by mouth daily. 30 tablet 1  . timolol (TIMOPTIC) 0.5 % ophthalmic solution Place 1 drop into both eyes 2 (two) times daily.     Marland Kitchen apixaban (ELIQUIS) 2.5 MG TABS tablet Take 1 tablet (2.5 mg total) by mouth 2 (two) times daily. (Patient not taking: Reported on 12/16/2019) 60 tablet 1  . Cholecalciferol (VITAMIN D3) 250 MCG (10000 UT) TABS Take 1 tablet by mouth 2 (two) times daily.    . furosemide (LASIX) 20 MG tablet Take 1 tablet (20 mg total) by mouth daily. Daily, as needed for lower leg swelling. (Patient not taking: Reported on 12/16/2019) 30 tablet 0   No current facility-administered medications on file prior to visit.    Allergies  Allergen Reactions  . Atorvastatin Other (See Comments)    myalgias  . Morphine And Related Other (See Comments)     hypotension  . Levofloxacin Rash  . Septra [Sulfamethoxazole-Trimethoprim] Rash    Past Medical History:  Diagnosis Date  . BPH (benign prostatic hypertrophy)   . Coronary artery disease   . History of kidney stones   . History of melanoma excision OCT 2010  . Hyperlipidemia   . Hypertension   . Left ureteral calculus   . Nephrolithiasis    bilateral  . Type 2 diabetes mellitus ([place])     Past Surgical History:  Procedure Laterality Date  . CHOLECYSTECTOMY  july 2000  .  CORONARY ARTERY BYPASS GRAFT N/A 11/28/2019   Procedure: CORONARY ARTERY BYPASS GRAFTING (CABG) x3, using left internal mammary artery and Saphenous vein harvested endoscopically;  Surgeon: [name], MD;  Location: [facility];  Service: Open Heart Surgery;  Laterality: N/A;  . CYSTOSCOPY WITH STENT PLACEMENT Left 11/14/2012   Procedure: CYSTOSCOPY WITH STENT PLACEMENT;  Surgeon: [name], MD;  Location: [facility];  Service: Urology;  Laterality: Left;  . CYSTOSCOPY/RETROGRADE/URETEROSCOPY/STONE EXTRACTION WITH BASKET Left 11/14/2012   Procedure: CYSTOSCOPY/RETROGRADE/URETEROSCOPY/STONE EXTRACTION WITH  BASKET;  Surgeon: [name], MD;  Location: [facility];  Service: Urology;  Laterality: Left;  . EXTRACORPOREAL SHOCK WAVE LITHOTRIPSY  JULY 2000   X2  . HOLMIUM LASER APPLICATION Left [id number]   Procedure: HOLMIUM LASER APPLICATION;  Surgeon: [name], MD;  Location: [facility];  Service: Urology;  Laterality: Left;  . LEFT HEART CATH AND CORONARY ANGIOGRAPHY N/A 11/27/2019   Procedure: LEFT HEART CATH AND CORONARY ANGIOGRAPHY;  Surgeon: [name], MD;  Location: [facility];  Service: Cardiovascular;  Laterality: N/A;  . LEFT URETEROSCOPIC STONE EXTRACTION  02-22-2007  . NEPHROLITHOTOMY Right 09-28-2010   percutaneous  . ORIF CLAVICLE FRACTURE  2006  . PROSTATE BIOPSY  1998   neg  . SHOULDER ARTHROSCOPY  1/15   Dr [name]  . TEE WITHOUT CARDIOVERSION N/A 11/28/2019   Procedure: TRANSESOPHAGEAL ECHOCARDIOGRAM (TEE);  Surgeon: [name], Collier Salina, MD;  Location: [facility];  Service: Open Heart Surgery;  Laterality: N/A;  . TONSILLECTOMY      Family History  Problem Relation Age of Onset  . Hypertension Mother   . Diabetes Father   . Hypertension Father   . Coronary artery disease Neg Hx   . Cancer Neg Hx     Social History   Socioeconomic History  . Marital status: Married    Spouse name: Not on file  . Number of children: 2  . Years of education: Not on file  . Highest education level: Not on file  Occupational History  . 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 Occupation: Buys very distressed houses and rehabilitates  . Occupation: Does mediation  Tobacco Use  . Smoking status: Never Smoker  . Smokeless tobacco: Never Used  Substance and Sexual Activity  . Alcohol use: No  . Drug use: No  . Sexual activity: Not on file  Other Topics Concern  . Not on file  Social History Narrative   Has living will   Wife is health care POA---alternate is committee of physicians   Would accept resuscitation---wouldn't  accept prolonged ventilation   No tube feeds if cognitively unaware   Social Determinants of Health   Financial Resource Strain:   . Difficulty of Paying Living Expenses:   Food Insecurity:   . Worried About Charity fundraiser in the Last Year:   . Arboriculturist in the Last Year:   Transportation Needs:   . Film/video editor (Medical):   Marland Kitchen Lack of Transportation (Non-Medical):   Physical Activity:   . Days of Exercise per Week:   . Minutes of Exercise per Session:   Stress:   . Feeling of Stress :   Social Connections:   . Frequency of Communication with Friends and Family:   . Frequency of Social Gatherings with Friends and Family:   . Attends Religious Services:   . Active Member of Clubs or Organizations:   . Attends Archivist Meetings:   .  Marital Status:   Intimate Partner Violence:   . Fear of Current or Ex-Partner:   . Emotionally Abused:   Marland Kitchen Physically Abused:   . Sexually Abused:    Review of Systems  Has been weighing daily Has lost 15# since coming home--seems to have been fluid Wife has had to encourage him to drink No orthostatic dizziness    Objective:   Physical Exam  Constitutional: No distress.  Some wasting  Neck: No thyromegaly present.  Cardiovascular: Normal rate, regular rhythm and normal heart sounds. Exam reveals no gallop.  No murmur heard. Respiratory: Effort normal. No respiratory distress. He has no wheezes.  Decreased breath sounds and some dullness at bases  GI: Soft. He exhibits no distension. There is no abdominal tenderness. There is no rebound and no guarding.  Genitourinary:    Genitourinary Comments: No anal masses Only slight stool in the rectum --greenish black and heme positive   Musculoskeletal:        General: No edema.  Lymphadenopathy:    He has no cervical adenopathy.  Psychiatric: He has a normal mood and affect. His behavior is normal.           Assessment & Plan:

## 2019-12-16 NOTE — Assessment & Plan Note (Signed)
Remains in sinus rhythm on amiodarone and digoxin Will continue the ASA but not the eliquis--in view of the GI bleed

## 2019-12-17 ENCOUNTER — Ambulatory Visit: Payer: PPO | Admitting: Internal Medicine

## 2019-12-19 ENCOUNTER — Ambulatory Visit (INDEPENDENT_AMBULATORY_CARE_PROVIDER_SITE_OTHER): Payer: PPO | Admitting: Orthotics

## 2019-12-19 ENCOUNTER — Other Ambulatory Visit: Payer: Self-pay

## 2019-12-19 DIAGNOSIS — M2041 Other hammer toe(s) (acquired), right foot: Secondary | ICD-10-CM

## 2019-12-19 DIAGNOSIS — M79676 Pain in unspecified toe(s): Secondary | ICD-10-CM

## 2019-12-19 DIAGNOSIS — M2042 Other hammer toe(s) (acquired), left foot: Secondary | ICD-10-CM | POA: Diagnosis not present

## 2019-12-19 DIAGNOSIS — Q828 Other specified congenital malformations of skin: Secondary | ICD-10-CM | POA: Diagnosis not present

## 2019-12-19 DIAGNOSIS — E1142 Type 2 diabetes mellitus with diabetic polyneuropathy: Secondary | ICD-10-CM

## 2019-12-19 DIAGNOSIS — E1121 Type 2 diabetes mellitus with diabetic nephropathy: Secondary | ICD-10-CM | POA: Diagnosis not present

## 2019-12-19 NOTE — Progress Notes (Signed)

## 2020-01-06 ENCOUNTER — Ambulatory Visit (HOSPITAL_COMMUNITY)
Admit: 2020-01-06 | Discharge: 2020-01-06 | Disposition: A | Discharge status: Home / Self Care | Payer: PPO | Attending: Cardiology | Admitting: Cardiology

## 2020-01-06 ENCOUNTER — Encounter (HOSPITAL_COMMUNITY): Payer: Self-pay

## 2020-01-06 ENCOUNTER — Other Ambulatory Visit: Payer: Self-pay | Admitting: Cardiothoracic Surgery

## 2020-01-06 ENCOUNTER — Other Ambulatory Visit: Payer: Self-pay

## 2020-01-06 VITALS — BP 110/64 | HR 73 | Wt 144.0 lb

## 2020-01-06 DIAGNOSIS — I110 Hypertensive heart disease with heart failure: Secondary | ICD-10-CM | POA: Insufficient documentation

## 2020-01-06 DIAGNOSIS — I4891 Unspecified atrial fibrillation: Secondary | ICD-10-CM | POA: Diagnosis not present

## 2020-01-06 DIAGNOSIS — N400 Enlarged prostate without lower urinary tract symptoms: Secondary | ICD-10-CM | POA: Diagnosis not present

## 2020-01-06 DIAGNOSIS — I255 Ischemic cardiomyopathy: Secondary | ICD-10-CM | POA: Diagnosis not present

## 2020-01-06 DIAGNOSIS — Z8249 Family history of ischemic heart disease and other diseases of the circulatory system: Secondary | ICD-10-CM | POA: Insufficient documentation

## 2020-01-06 DIAGNOSIS — Z87442 Personal history of urinary calculi: Secondary | ICD-10-CM | POA: Insufficient documentation

## 2020-01-06 DIAGNOSIS — E119 Type 2 diabetes mellitus without complications: Secondary | ICD-10-CM | POA: Diagnosis not present

## 2020-01-06 DIAGNOSIS — I97190 Other postprocedural cardiac functional disturbances following cardiac surgery: Secondary | ICD-10-CM | POA: Insufficient documentation

## 2020-01-06 DIAGNOSIS — Z951 Presence of aortocoronary bypass graft: Secondary | ICD-10-CM | POA: Insufficient documentation

## 2020-01-06 DIAGNOSIS — Z7982 Long term (current) use of aspirin: Secondary | ICD-10-CM | POA: Insufficient documentation

## 2020-01-06 DIAGNOSIS — Z79899 Other long term (current) drug therapy: Secondary | ICD-10-CM | POA: Diagnosis not present

## 2020-01-06 DIAGNOSIS — Z833 Family history of diabetes mellitus: Secondary | ICD-10-CM | POA: Insufficient documentation

## 2020-01-06 DIAGNOSIS — I2510 Atherosclerotic heart disease of native coronary artery without angina pectoris: Secondary | ICD-10-CM | POA: Diagnosis not present

## 2020-01-06 DIAGNOSIS — Z881 Allergy status to other antibiotic agents status: Secondary | ICD-10-CM | POA: Diagnosis not present

## 2020-01-06 DIAGNOSIS — Z7984 Long term (current) use of oral hypoglycemic drugs: Secondary | ICD-10-CM | POA: Diagnosis not present

## 2020-01-06 DIAGNOSIS — Z882 Allergy status to sulfonamides status: Secondary | ICD-10-CM | POA: Diagnosis not present

## 2020-01-06 DIAGNOSIS — Z85820 Personal history of malignant melanoma of skin: Secondary | ICD-10-CM | POA: Diagnosis not present

## 2020-01-06 DIAGNOSIS — I493 Ventricular premature depolarization: Secondary | ICD-10-CM | POA: Insufficient documentation

## 2020-01-06 DIAGNOSIS — E785 Hyperlipidemia, unspecified: Secondary | ICD-10-CM | POA: Insufficient documentation

## 2020-01-06 DIAGNOSIS — I5022 Chronic systolic (congestive) heart failure: Secondary | ICD-10-CM | POA: Diagnosis not present

## 2020-01-06 DIAGNOSIS — Z885 Allergy status to narcotic agent status: Secondary | ICD-10-CM | POA: Insufficient documentation

## 2020-01-06 LAB — BASIC METABOLIC PANEL WITH GFR
Anion gap: 9 (ref 5–15)
BUN: 32 mg/dL — ABNORMAL HIGH (ref 8–23)
CO2: 23 mmol/L (ref 22–32)
Calcium: 9.1 mg/dL (ref 8.9–10.3)
Chloride: 107 mmol/L (ref 98–111)
Creatinine, Ser: 1.55 mg/dL — ABNORMAL HIGH (ref 0.61–1.24)
GFR calc Af Amer: 49 mL/min — ABNORMAL LOW
GFR calc non Af Amer: 42 mL/min — ABNORMAL LOW
Glucose, Bld: 196 mg/dL — ABNORMAL HIGH (ref 70–99)
Potassium: 4.9 mmol/L (ref 3.5–5.1)
Sodium: 139 mmol/L (ref 135–145)

## 2020-01-06 LAB — DIGOXIN LEVEL: Digoxin Level: 1.1 ng/mL (ref 0.8–2.0)

## 2020-01-06 MED ORDER — SACUBITRIL-VALSARTAN 24-26 MG PO TABS: 1.0000 | ORAL_TABLET | Freq: Two times a day (BID) | ORAL | 3 refills | Status: DC

## 2020-01-06 MED ORDER — DIGOXIN 125 MCG PO TABS: 0.1250 mg | ORAL_TABLET | Freq: Every day | ORAL | 3 refills | Status: DC

## 2020-01-06 MED ORDER — AMIODARONE HCL 100 MG PO TABS: 100.0000 mg | ORAL_TABLET | Freq: Every day | ORAL | 3 refills | Status: DC

## 2020-01-06 MED ORDER — SPIRONOLACTONE 25 MG PO TABS: 25.0000 mg | ORAL_TABLET | Freq: Every day | ORAL | 3 refills | Status: DC

## 2020-01-06 NOTE — Patient Instructions (Signed)
DECREASE Amiodarone to 100 mg, one tab daily  Labs today We will only contact you if something comes back abnormal or we need to make some changes. Otherwise no news is good news!  Your physician recommends that you schedule a follow-up appointment in: 4-6 weeks with Dr Haroldine Laws  Do the following things EVERYDAY: 1) Weigh yourself in the morning before breakfast. Write it down and keep it in a log. 2) Take your medicines as prescribed 3) Eat low salt foods--Limit salt (sodium) to 2000 mg per day.  4) Stay as active as you can everyday 5) Limit all fluids for the day to less than 2 liters  At the [facility], you and your health needs are our priority. As part of our continuing mission to provide you with exceptional heart care, we have created designated Provider Care Teams. These Care Teams include your primary Cardiologist (physician) and Advanced Practice Providers (APPs- Physician Assistants and Nurse Practitioners) who all work together to provide you with the care you need, when you need it.   You may see any of the following providers on your designated Care Team at your next follow up: Marland Kitchen Dr Glori Bickers . Dr Loralie Champagne . Darrick Grinder, NP . Lyda Jester, PA . Audry Riles, PharmD   Please be sure to bring in all your medications bottles to every appointment.

## 2020-01-06 NOTE — Progress Notes (Signed)
Advanced Heart Failure Clinic Note   Referring Physician: PCP: [name], MD PCP-Cardiologist: Quay Burow, MD  [facility]: Dr. Haroldine Laws   Reason for Visit: [facility] F/u, Chronic Systolic Heart Failure/ Ischemic Cardiomyopathy   HPI: James Braun presents to clinic today for post hospital f/u.    79 y/o male w/ HTN, HLD, T2DM admitted 5/21 for LHC in the setting of new CP and abnormal NST. LHC showed severe multivessel CAD and he was referred for CABG. Preop echo showed moderately reduced LVEF, 35-40%, RV normal. No significant valvular disease. Underwent CABG x 3 (LIMA-LAD, SVG-diag, SVG-PLB) on 5/14 by Dr. [name].     Post operative course c/b atrial fibrillation + post cardiotomy shock requiring amiodarone and milrinone. Post op echo showed EF ~35% and mildly reduced RV systolic function.   He presents today accompanied by his wife. He looks and feels well. 2 days after returning home, his LEE had completely resolved and his wt returned to his baseline preop wt and stabilized ~140 lb. He has since only taken lasix PRN. He denies any significant dyspnea. No limitations w/ basic ADLs. No CP, orthopnea, PND, LEE, dizziness, palpitations, syncope/ near syncope. Reports full compliance w/ meds except his PCP took him off of Eliquis due to 1 episode of dark stools, which resolved after discontinuation. His BP is ok, low 865H systolic. EKG shows NSR. No symptoms of breakthrough Afib. His only complaint is diffuse itching, which he thinks may be related to one of his medications. He has f/u w/ Dr. [name] tomorrow.   Echo 5/21: LVEF 35% w/ diffuse hypokinesis, RV systolic function mildly reduced   Review of Systems: Pertinent +/- outline in HPI    Past Medical History:  Diagnosis Date  . BPH (benign prostatic hypertrophy)   . Coronary artery disease   . History of kidney stones   . History of melanoma excision OCT 2010  . Hyperlipidemia   . Hypertension   . Left ureteral  calculus   . Nephrolithiasis    bilateral  . Type 2 diabetes mellitus ([place])     Current Outpatient Medications  Medication Sig Dispense Refill  . alum hydroxide-mag trisilicate (GAVISCON) 84-69 MG CHEW chewable tablet Chew 2 tablets by mouth 3 (three) times daily as needed for indigestion or heartburn.    Marland Kitchen amiodarone (PACERONE) 200 MG tablet Take 1 tablet (200 mg total) by mouth daily. 30 tablet 1  . aspirin EC 81 MG EC tablet Take 1 tablet (81 mg total) by mouth daily.    . Cholecalciferol (VITAMIN D3) 250 MCG (10000 UT) TABS Take 1 tablet by mouth 2 (two) times daily.    . Cyanocobalamin (VITAMIN B-12) 1000 MCG SUBL Place under the tongue.    . digoxin (LANOXIN) 0.125 MG tablet Take 1 tablet (0.125 mg total) by mouth daily. 30 tablet 1  . finasteride (PROSCAR) 5 MG tablet Take 1 tablet (5 mg total) by mouth daily. 90 tablet 3  . metFORMIN (GLUCOPHAGE-XR) 500 MG 24 hr tablet Take 1 tablet (500 mg total) by mouth 2 (two) times daily. 180 tablet 3  . Multiple Vitamin (MULTIVITAMIN) tablet Take 1 tablet by mouth daily.    Marland Kitchen omeprazole (PRILOSEC) 20 MG capsule Take 20 mg by mouth daily.    . Potassium Citrate 15 MEQ (1620 MG) TBCR Take 1 tablet by mouth in the morning and at bedtime.    . sacubitril-valsartan (ENTRESTO) 24-26 MG Take 1 tablet by mouth 2 (two) times daily. 60 tablet  1  . spironolactone (ALDACTONE) 25 MG tablet Take 1 tablet (25 mg total) by mouth daily. 30 tablet 1  . timolol (TIMOPTIC) 0.5 % ophthalmic solution Place 1 drop into both eyes 2 (two) times daily.     . furosemide (LASIX) 20 MG tablet Take 1 tablet (20 mg total) by mouth daily. Daily, as needed for lower leg swelling. (Patient not taking: Reported on 12/16/2019) 30 tablet 0   No current facility-administered medications for this encounter.    Allergies  Allergen Reactions  . Atorvastatin Other (See Comments)    myalgias  . Morphine And Related Other (See Comments)     hypotension  . Levofloxacin Rash  .  Septra [Sulfamethoxazole-Trimethoprim] Rash      Social History   Socioeconomic History  . Marital status: Married    Spouse name: Not on file  . Number of children: 2  . Years of education: Not on file  . Highest education level: Not on file  Occupational History  . 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 Occupation: Buys very distressed houses and rehabilitates  . Occupation: Does mediation  Tobacco Use  . Smoking status: Never Smoker  . Smokeless tobacco: Never Used  Substance and Sexual Activity  . Alcohol use: No  . Drug use: No  . Sexual activity: Not on file  Other Topics Concern  . Not on file  Social History Narrative   Has living will   Wife is health care POA---alternate is committee of physicians   Would accept resuscitation---wouldn't accept prolonged ventilation   No tube feeds if cognitively unaware   Social Determinants of Health   Financial Resource Strain:   . Difficulty of Paying Living Expenses:   Food Insecurity:   . Worried About Charity fundraiser in the Last Year:   . Arboriculturist in the Last Year:   Transportation Needs:   . Film/video editor (Medical):   Marland Kitchen Lack of Transportation (Non-Medical):   Physical Activity:   . Days of Exercise per Week:   . Minutes of Exercise per Session:   Stress:   . Feeling of Stress :   Social Connections:   . Frequency of Communication with Friends and Family:   . Frequency of Social Gatherings with Friends and Family:   . Attends Religious Services:   . Active Member of Clubs or Organizations:   . Attends Archivist Meetings:   Marland Kitchen Marital Status:   Intimate Partner Violence:   . Fear of Current or Ex-Partner:   . Emotionally Abused:   Marland Kitchen Physically Abused:   . Sexually Abused:       Family History  Problem Relation Age of Onset  . Hypertension Mother   . Diabetes Father   . Hypertension Father   . Coronary artery disease Neg Hx   . Cancer Neg Hx     Vitals:    01/06/20 0933  BP: 110/64  Pulse: 73  SpO2: 98%  Weight: 65.3 kg     PHYSICAL EXAM: General:  Well appearing. No respiratory difficulty HEENT: normal Neck: supple. no JVD. Carotids 2+ bilat; no bruits. No lymphadenopathy or thyromegaly appreciated. Cor: PMI nondisplaced. Regular rate & rhythm. No rubs, gallops or murmurs. Lungs: clear Abdomen: soft, nontender, nondistended. No hepatosplenomegaly. No bruits or masses. Good bowel sounds. Extremities: no cyanosis, clubbing, rash, edema Neuro: alert & oriented x 3, cranial nerves grossly intact. moves all 4 extremities w/o difficulty. Affect pleasant.  ECG:  NSR 75 bpm,  no PVCs    ASSESSMENT & PLAN:  1. CAD - s/p CABG x 3 5/14 (LIMA-LAD, SVG-dig, SVG- PLB) - no anginal symptom otology  - continue ASA + statin  - no  blocker w/ recent shock  - has post-op f/u w/ Dr. [name] tomorrow - recommend cardiac rehab   2.Chronic Systolic HF/ Ischemic Cardiomyopathy  - Echo 5/21 LVEF 98%-42%, RV systolic function mildly reduced, in the setting of multivessel CAD s/p CABG - Developed Post Cardiotomy Cardiogenic Shock following CABG and required short course of milrinone.   - Volume status stable. Euvolemic on exam - NYHA Class II - Continue digoxin 0.125 mg. Check dig level today  - Continue Entresto 24-26 bid. BP too soft to titrate  - Continue Spironolactone 25 mg daily  - Continue PRN Lasix 20 mg  - no  blocker yet w/ recent shock  - continue addition of SGLT2i soon   4. Post-operative Afib - maintaining NSR on amiodarone  - reduce amio to 100 mg daily (continuing for PVC suppression)  - no longer on Eliquis (d/c by PCP due to melena, now resolved)  5. PVCs - noted to have frequent PVCs pos operatively  -  suppressed w/ amiodarone - no PVCs on EKG today - will continue amiordarone but will reduce down to 100 mg daily (pt notes diffuse pruritis, may be related to medication)  F/u w/ Dr. Haroldine Laws in 4 weeks.     Lyda Jester, PA-C 01/06/20

## 2020-01-07 ENCOUNTER — Ambulatory Visit
Admission: RE | Admit: 2020-01-07 | Discharge: 2020-01-07 | Disposition: A | Discharge status: Home / Self Care | Payer: PPO | Source: Ambulatory Visit | Attending: Cardiothoracic Surgery | Admitting: Cardiothoracic Surgery

## 2020-01-07 ENCOUNTER — Encounter: Payer: Self-pay | Admitting: Cardiothoracic Surgery

## 2020-01-07 ENCOUNTER — Ambulatory Visit (INDEPENDENT_AMBULATORY_CARE_PROVIDER_SITE_OTHER): Payer: Self-pay | Admitting: Cardiothoracic Surgery

## 2020-01-07 VITALS — BP 128/78 | HR 82 | Temp 97.7°F | Resp 16 | Ht 66.0 in | Wt 144.0 lb

## 2020-01-07 DIAGNOSIS — Z09 Encounter for follow-up examination after completed treatment for conditions other than malignant neoplasm: Secondary | ICD-10-CM

## 2020-01-07 DIAGNOSIS — I2510 Atherosclerotic heart disease of native coronary artery without angina pectoris: Secondary | ICD-10-CM

## 2020-01-07 DIAGNOSIS — Z951 Presence of aortocoronary bypass graft: Secondary | ICD-10-CM

## 2020-01-07 DIAGNOSIS — J90 Pleural effusion, not elsewhere classified: Secondary | ICD-10-CM | POA: Diagnosis not present

## 2020-01-07 NOTE — Progress Notes (Signed)
PCP is [name], MD Referring Provider is [name], MD  Chief Complaint  Patient presents with  . Routine Post Op    f/u from surgery with CXR s/p; CABG x3, 11/28/19    HPI: 1 month postop visit with chest x-ray after multivessel CABG for non-STEMI and moderate LV dysfunction EF 35%.  He has done well following surgery.  He has not been on a beta-blocker because of his LV dysfunction.  He was discharged home on oral amiodarone without anticoagulation for some intermittent A. fib and PVCs which has now resolved.  He is having skin itching sensation and overall malaise we will discontinue the amiodarone.  Otherwise his chest x-ray looks good and he is eager to resume driving and to start outpatient cardiac rehab.  Past Medical History:  Diagnosis Date  . BPH (benign prostatic hypertrophy)   . Coronary artery disease   . History of kidney stones   . History of melanoma excision OCT 2010  . Hyperlipidemia   . Hypertension   . Left ureteral calculus   . Nephrolithiasis    bilateral  . Type 2 diabetes mellitus ([place])     Past Surgical History:  Procedure Laterality Date  . CHOLECYSTECTOMY  july 2000  . CORONARY ARTERY BYPASS GRAFT N/A 11/28/2019   Procedure: CORONARY ARTERY BYPASS GRAFTING (CABG) x3, using left internal mammary artery and Saphenous vein harvested endoscopically;  Surgeon: [name], MD;  Location: [place];  Service: Open Heart Surgery;  Laterality: N/A;  . CYSTOSCOPY WITH STENT PLACEMENT Left 11/14/2012   Procedure: CYSTOSCOPY WITH STENT PLACEMENT;  Surgeon: [name], MD;  Location: [facility];  Service: Urology;  Laterality: Left;  . CYSTOSCOPY/RETROGRADE/URETEROSCOPY/STONE EXTRACTION WITH BASKET Left 11/14/2012   Procedure: CYSTOSCOPY/RETROGRADE/URETEROSCOPY/STONE EXTRACTION WITH BASKET;  Surgeon: [name], MD;  Location[facility];  Service: Urology;  Laterality: Left;  . EXTRACORPOREAL SHOCK WAVE  LITHOTRIPSY  JULY 2000   X2  . HOLMIUM LASER APPLICATION Left 03/25/8337   Procedure: HOLMIUM LASER APPLICATION;  Surgeon: [name], MD;  Location: [facility];  Service: Urology;  Laterality: Left;  . LEFT HEART CATH AND CORONARY ANGIOGRAPHY N/A 11/27/2019   Procedure: LEFT HEART CATH AND CORONARY ANGIOGRAPHY;  Surgeon: [name], MD;  Location: [place] [facility];  Service: Cardiovascular;  Laterality: N/A;  . LEFT URETEROSCOPIC STONE EXTRACTION  02-22-2007  . NEPHROLITHOTOMY Right 09-28-2010   percutaneous  . ORIF CLAVICLE FRACTURE  2006  . PROSTATE BIOPSY  1998   neg  . SHOULDER ARTHROSCOPY  1/15   Dr [name]  . TEE WITHOUT CARDIOVERSION N/A 11/28/2019   Procedure: TRANSESOPHAGEAL ECHOCARDIOGRAM (TEE);  Surgeon: [name], Collier Salina, MD;  Location: [place];  Service: Open Heart Surgery;  Laterality: N/A;  . TONSILLECTOMY      Family History  Problem Relation Age of Onset  . Hypertension Mother   . Diabetes Father   . Hypertension Father   . Coronary artery disease Neg Hx   . Cancer Neg Hx     Social History Social History   Tobacco Use  . Smoking status: Never Smoker  . Smokeless tobacco: Never Used  Substance Use Topics  . Alcohol use: No  . Drug use: No    Current Outpatient Medications  Medication Sig Dispense Refill  . alum hydroxide-mag trisilicate (GAVISCON) 25-05 MG CHEW chewable tablet Chew 2 tablets by mouth 3 (three) times daily as needed for indigestion or heartburn.    Marland Kitchen  amiodarone (PACERONE) 100 MG tablet Take 1 tablet (100 mg total) by mouth daily. 30 tablet 3  . aspirin EC 81 MG EC tablet Take 1 tablet (81 mg total) by mouth daily.    . Cholecalciferol (VITAMIN D3) 250 MCG (10000 UT) TABS Take 1 tablet by mouth 2 (two) times daily.    . Cyanocobalamin (VITAMIN B-12) 1000 MCG SUBL Place under the tongue.    . digoxin (LANOXIN) 0.125 MG tablet Take 1 tablet (0.125 mg total) by mouth daily. 30 tablet 3  . finasteride (PROSCAR) 5 MG  tablet Take 1 tablet (5 mg total) by mouth daily. 90 tablet 3  . metFORMIN (GLUCOPHAGE-XR) 500 MG 24 hr tablet Take 1 tablet (500 mg total) by mouth 2 (two) times daily. 180 tablet 3  . Multiple Vitamin (MULTIVITAMIN) tablet Take 1 tablet by mouth daily.    Marland Kitchen omeprazole (PRILOSEC) 20 MG capsule Take 20 mg by mouth daily.    . Potassium Citrate 15 MEQ (1620 MG) TBCR Take 1 tablet by mouth in the morning and at bedtime.    . sacubitril-valsartan (ENTRESTO) 24-26 MG Take 1 tablet by mouth 2 (two) times daily. 60 tablet 3  . spironolactone (ALDACTONE) 25 MG tablet Take 1 tablet (25 mg total) by mouth daily. 30 tablet 3  . timolol (TIMOPTIC) 0.5 % ophthalmic solution Place 1 drop into both eyes 2 (two) times daily.      No current facility-administered medications for this visit.    Allergies  Allergen Reactions  . Atorvastatin Other (See Comments)    myalgias  . Morphine And Related Other (See Comments)     hypotension  . Levofloxacin Rash  . Septra [Sulfamethoxazole-Trimethoprim] Rash    Review of Systems  Appetite slowly improving Sleep pattern slowly improving All strength improving No chest pain dyspnea on exertion ankle edema or drainage from surgical incisions  BP 128/78 (BP Location: Left Arm, Patient Position: Sitting, Cuff Size: Normal)   Pulse 82   Temp 97.7 F (36.5 C)   Resp 16   Ht 5\' 6"  (1.676 m)   Wt 144 lb (65.3 kg)   SpO2 98% Comment: RA  BMI 23.24 kg/m  Physical Exam      Exam    General- alert and comfortable.  Sternal incision well-healed.    Neck- no JVD, no cervical adenopathy palpable, no carotid bruit   Lungs- clear without rales, wheezes   Cor- regular rate and rhythm, no murmur , gallop   Abdomen- soft, non-tender   Extremities - warm, non-tender, minimal edema   Neuro- oriented, appropriate, no focal weakness  Diagnostic Tests: Today's chest x-ray personally reviewed showing clear lung fields, stable cardiac silhouette and sternal wires  intact Impression: Doing well after urgent multivessel CABG. Maintaining sinus rhythm with probable side effects from amiodarone so we will discontinue He will be followed up in the heart failure clinic regarding titration of heart failure medications  Plan: The patient may drive and lift up to 10 pounds.  We will refer him to phase 2 cardiac rehab outpatient at [facility].  I will see him back in 8 weeks for final review of progress to discuss things further restrictions on activity.   Len Childs, MD [facility] [phone]

## 2020-01-08 ENCOUNTER — Ambulatory Visit: Payer: PPO | Admitting: Podiatry

## 2020-01-08 ENCOUNTER — Other Ambulatory Visit: Payer: Self-pay

## 2020-01-08 ENCOUNTER — Telehealth (HOSPITAL_COMMUNITY): Payer: Self-pay | Admitting: Cardiology

## 2020-01-08 ENCOUNTER — Encounter: Payer: Self-pay | Admitting: Podiatry

## 2020-01-08 ENCOUNTER — Telehealth (HOSPITAL_COMMUNITY): Payer: Self-pay | Admitting: *Deleted

## 2020-01-08 DIAGNOSIS — M79676 Pain in unspecified toe(s): Secondary | ICD-10-CM | POA: Diagnosis not present

## 2020-01-08 DIAGNOSIS — I5022 Chronic systolic (congestive) heart failure: Secondary | ICD-10-CM

## 2020-01-08 DIAGNOSIS — B351 Tinea unguium: Secondary | ICD-10-CM | POA: Diagnosis not present

## 2020-01-08 DIAGNOSIS — Q828 Other specified congenital malformations of skin: Secondary | ICD-10-CM

## 2020-01-08 DIAGNOSIS — E1121 Type 2 diabetes mellitus with diabetic nephropathy: Secondary | ICD-10-CM | POA: Diagnosis not present

## 2020-01-08 DIAGNOSIS — N1831 Chronic kidney disease, stage 3a: Secondary | ICD-10-CM

## 2020-01-08 NOTE — Telephone Encounter (Signed)
-----   Message from Consuelo Pandy, [place] sent at 01/08/2020  5:15 PM EDT ----- Dig level elevated. Stop digoxin. Repeat dig level + BMP in 1 week

## 2020-01-08 NOTE — Telephone Encounter (Signed)
Pt aware and voiced understanding Patient will have repeat labs done at PCP office. Added to appt notes and order placed

## 2020-01-08 NOTE — Telephone Encounter (Signed)
Called and spoke to pt regarding referral for cardiac rehab and his preference of location.  Pt would like to come here to [facility].  Will notify [facility] Cardiac rehab staff of pt change.  Pt has completed his follow up appt with the heart surgeon and cardiology.  Will forward his information to support staff for verification of his insurance benefits and scheduling.  Pt aware first available is the end of July. Cherre Huger, BSN Cardiac and Training and development officer

## 2020-01-08 NOTE — Progress Notes (Signed)
This patient returns to my office for at risk foot care.  This patient requires this care by a professional since this patient will be at risk due to having  Diabetes and CKD.    This patient is unable to cut nails himself since the patient cannot reach his nails.These nails are painful walking and wearing shoes.  This patient presents for at risk foot care today.  General Appearance  Alert, conversant and in no acute stress.  Vascular  Dorsalis pedis and posterior tibial  pulses are palpable  bilaterally.  Capillary return is within normal limits  bilaterally. Temperature is within normal limits  bilaterally.  Neurologic  Senn-Weinstein monofilament wire test diminished  bilaterally. Muscle power within normal limits bilaterally.  Nails Thick disfigured discolored nails with subungual debris  from hallux to fifth toes bilaterally. No evidence of bacterial infection or drainage bilaterally.  Orthopedic  No limitations of motion  feet .  No crepitus or effusions noted.  No bony pathology or digital deformities noted.  Skin  normotropic skin with no porokeratosis noted bilaterally.  No signs of infections or ulcers noted.   Porokeratosis sub 5th  B/L and left hee.  Onychomycosis  Pain in right toes  Pain in left toes  Porokeratosis  B/L.  Consent was obtained for treatment procedures.   Mechanical debridement of nails 1-5  bilaterally performed with a nail nipper.  Filed with dremel without incident. No infection or ulcer.  Debride callus with # 15 blade.   Return office visit  10 weeks                Told patient to return for periodic foot care and evaluation due to potential at risk complications.   Gardiner Barefoot DPM

## 2020-01-12 ENCOUNTER — Telehealth (HOSPITAL_COMMUNITY): Payer: Self-pay

## 2020-01-12 NOTE — Telephone Encounter (Signed)
Called patient to see if he was interested in participating in the Cardiac Rehab Program. Patient stated yes. Patient will come in for orientation on 02/17/20 @ 2PM and will attend the 11:15AM exercise class. Went over insurance, patient verbalized understanding.   Mailed letter.

## 2020-01-12 NOTE — Telephone Encounter (Signed)
Pt insurance is active and benefits verified through [employer]. Co-pay $15.00, DED $0.00/$0.00 met, out of pocket $3,400.00/$60.00 met, co-insurance 0%. No pre-authorization required. George/[employer], 01/12/20 @ 3:40PM, JJH#[id number]  Will contact patient to see if he is interested in the Cardiac Rehab Program.

## 2020-01-16 ENCOUNTER — Other Ambulatory Visit: Payer: Self-pay

## 2020-01-16 ENCOUNTER — Ambulatory Visit (INDEPENDENT_AMBULATORY_CARE_PROVIDER_SITE_OTHER): Payer: PPO | Admitting: Internal Medicine

## 2020-01-16 ENCOUNTER — Encounter: Payer: Self-pay | Admitting: Internal Medicine

## 2020-01-16 VITALS — BP 110/80 | HR 82 | Temp 97.9°F | Ht 66.0 in | Wt 146.0 lb

## 2020-01-16 DIAGNOSIS — K921 Melena: Secondary | ICD-10-CM

## 2020-01-16 DIAGNOSIS — I2510 Atherosclerotic heart disease of native coronary artery without angina pectoris: Secondary | ICD-10-CM

## 2020-01-16 DIAGNOSIS — I5022 Chronic systolic (congestive) heart failure: Secondary | ICD-10-CM | POA: Diagnosis not present

## 2020-01-16 DIAGNOSIS — I480 Paroxysmal atrial fibrillation: Secondary | ICD-10-CM

## 2020-01-16 LAB — HEPATIC FUNCTION PANEL
ALT: 19 U/L (ref 0–53)
AST: 18 U/L (ref 0–37)
Albumin: 4.1 g/dL (ref 3.5–5.2)
Alkaline Phosphatase: 57 U/L (ref 39–117)
Bilirubin, Direct: 0 mg/dL (ref 0.0–0.3)
Total Bilirubin: 0.4 mg/dL (ref 0.2–1.2)
Total Protein: 6.7 g/dL (ref 6.0–8.3)

## 2020-01-16 LAB — CBC
HCT: 32.4 % — ABNORMAL LOW (ref 39.0–52.0)
Hemoglobin: 10.4 g/dL — ABNORMAL LOW (ref 13.0–17.0)
MCHC: 32.1 g/dL (ref 30.0–36.0)
MCV: 77.7 fl — ABNORMAL LOW (ref 78.0–100.0)
Platelets: 439 10*3/uL — ABNORMAL HIGH (ref 150.0–400.0)
RBC: 4.17 Mil/uL — ABNORMAL LOW (ref 4.22–5.81)
RDW: 21 % — ABNORMAL HIGH (ref 11.5–15.5)
WBC: 6.3 10*3/uL (ref 4.0–10.5)

## 2020-01-16 LAB — RENAL FUNCTION PANEL
Albumin: 4.1 g/dL (ref 3.5–5.2)
BUN: 30 mg/dL — ABNORMAL HIGH (ref 6–23)
CO2: 27 mEq/L (ref 19–32)
Calcium: 9.3 mg/dL (ref 8.4–10.5)
Chloride: 104 mEq/L (ref 96–112)
Creatinine, Ser: 1.3 mg/dL (ref 0.40–1.50)
GFR: 53.13 mL/min — ABNORMAL LOW (ref >60.00)
Glucose, Bld: 155 mg/dL — ABNORMAL HIGH (ref 70–99)
Phosphorus: 3.5 mg/dL (ref 2.3–4.6)
Potassium: 4.3 mEq/L (ref 3.5–5.1)
Sodium: 140 mEq/L (ref 135–145)

## 2020-01-16 LAB — LIPID PANEL
Cholesterol: 188 mg/dL (ref 0–200)
HDL: 33.2 mg/dL — ABNORMAL LOW (ref >39.00)
LDL Cholesterol: 125 mg/dL — ABNORMAL HIGH (ref 0–99)
NonHDL: 154.6
Total CHOL/HDL Ratio: 6
Triglycerides: 146 mg/dL (ref 0.0–149.0)
VLDL: 29.2 mg/dL (ref 0.0–40.0)

## 2020-01-16 LAB — T4, FREE: Free T4: 0.82 ng/dL (ref 0.60–1.60)

## 2020-01-16 NOTE — Progress Notes (Signed)
Subjective:    Patient ID: James Braun, male    DOB: 1940/08/28, 79 y.o.   MRN: [id number]  HPI Here for follow up of apparent GI bleed This visit occurred during the SARS-CoV-2 public health emergency.  Safety protocols were in place, including screening questions prior to the visit, additional usage of staff PPE, and extensive cleaning of exam room while observing appropriate contact time as indicated for disinfecting solutions.   Doing okay No more black stools No abdominal pain, heartburn, dysphagia  Stools are normal colored and daily  Breathing is good No chest pain No sig edema Will be starting cardiac rehab  Current Outpatient Medications on File Prior to Visit  Medication Sig Dispense Refill  . alum hydroxide-mag trisilicate (GAVISCON) 81-85 MG CHEW chewable tablet Chew 2 tablets by mouth 3 (three) times daily as needed for indigestion or heartburn.    Marland Kitchen aspirin EC 81 MG EC tablet Take 1 tablet (81 mg total) by mouth daily.    . Cholecalciferol (VITAMIN D3) 250 MCG (10000 UT) TABS Take 1 tablet by mouth 2 (two) times daily.    . Cyanocobalamin (VITAMIN B-12) 1000 MCG SUBL Place under the tongue.    . finasteride (PROSCAR) 5 MG tablet Take 1 tablet (5 mg total) by mouth daily. 90 tablet 3  . metFORMIN (GLUCOPHAGE-XR) 500 MG 24 hr tablet Take 1 tablet (500 mg total) by mouth 2 (two) times daily. 180 tablet 3  . Multiple Vitamin (MULTIVITAMIN) tablet Take 1 tablet by mouth daily.    Marland Kitchen omeprazole (PRILOSEC) 20 MG capsule Take 20 mg by mouth daily.    . Potassium Citrate 15 MEQ (1620 MG) TBCR Take 1 tablet by mouth in the morning and at bedtime.    . sacubitril-valsartan (ENTRESTO) 24-26 MG Take 1 tablet by mouth 2 (two) times daily. 60 tablet 3  . spironolactone (ALDACTONE) 25 MG tablet Take 1 tablet (25 mg total) by mouth daily. 30 tablet 3  . timolol (TIMOPTIC) 0.5 % ophthalmic solution Place 1 drop into both eyes 2 (two) times daily.      No current  facility-administered medications on file prior to visit.    Allergies  Allergen Reactions  . Atorvastatin Other (See Comments)    myalgias  . Morphine And Related Other (See Comments)     hypotension  . Levofloxacin Rash  . Septra [Sulfamethoxazole-Trimethoprim] Rash    Past Medical History:  Diagnosis Date  . BPH (benign prostatic hypertrophy)   . Coronary artery disease   . History of kidney stones   . History of melanoma excision OCT 2010  . Hyperlipidemia   . Hypertension   . Left ureteral calculus   . Nephrolithiasis    bilateral  . Type 2 diabetes mellitus ([place])     Past Surgical History:  Procedure Laterality Date  . CHOLECYSTECTOMY  july 2000  . CORONARY ARTERY BYPASS GRAFT N/A 11/28/2019   Procedure: CORONARY ARTERY BYPASS GRAFTING (CABG) x3, using left internal mammary artery and Saphenous vein harvested endoscopically;  Surgeon: [name], MD;  Location: [place];  Service: Open Heart Surgery;  Laterality: N/A;  . CYSTOSCOPY WITH STENT PLACEMENT Left 11/14/2012   Procedure: CYSTOSCOPY WITH STENT PLACEMENT;  Surgeon: [name], MD;  Location: [facility];  Service: Urology;  Laterality: Left;  . CYSTOSCOPY/RETROGRADE/URETEROSCOPY/STONE EXTRACTION WITH BASKET Left 11/14/2012   Procedure: CYSTOSCOPY/RETROGRADE/URETEROSCOPY/STONE EXTRACTION WITH BASKET;  Surgeon: [name], MD;  Location: [facility];  Service: Urology;  Laterality: Left;  .  EXTRACORPOREAL SHOCK WAVE LITHOTRIPSY  JULY 2000   X2  . HOLMIUM LASER APPLICATION Left 08/25/9369   Procedure: HOLMIUM LASER APPLICATION;  Surgeon: [name], MD;  Location: [facility];  Service: Urology;  Laterality: Left;  . LEFT HEART CATH AND CORONARY ANGIOGRAPHY N/A 11/27/2019   Procedure: LEFT HEART CATH AND CORONARY ANGIOGRAPHY;  Surgeon: [name], MD;  Location: [place] [facility];  Service: Cardiovascular;  Laterality: N/A;  . LEFT URETEROSCOPIC  STONE EXTRACTION  02-22-2007  . NEPHROLITHOTOMY Right 09-28-2010   percutaneous  . ORIF CLAVICLE FRACTURE  2006  . PROSTATE BIOPSY  1998   neg  . SHOULDER ARTHROSCOPY  1/15   Dr [name]  . TEE WITHOUT CARDIOVERSION N/A 11/28/2019   Procedure: TRANSESOPHAGEAL ECHOCARDIOGRAM (TEE);  Surgeon: [name], Collier Salina, MD;  Location: [place];  Service: Open Heart Surgery;  Laterality: N/A;  . TONSILLECTOMY      Family History  Problem Relation Age of Onset  . Hypertension Mother   . Diabetes Father   . Hypertension Father   . Coronary artery disease Neg Hx   . Cancer Neg Hx     Social History   Socioeconomic History  . Marital status: Married    Spouse name: Not on file  . Number of children: 2  . Years of education: Not on file  . Highest education level: Not on file  Occupational History  . 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 Occupation: Buys very distressed houses and rehabilitates  . Occupation: Does mediation  Tobacco Use  . Smoking status: Never Smoker  . Smokeless tobacco: Never Used  Substance and Sexual Activity  . Alcohol use: No  . Drug use: No  . Sexual activity: Not on file  Other Topics Concern  . Not on file  Social History Narrative   Has living will   Wife is health care POA---alternate is committee of physicians   Would accept resuscitation---wouldn't accept prolonged ventilation   No tube feeds if cognitively unaware   Social Determinants of Health   Financial Resource Strain:   . Difficulty of Paying Living Expenses:   Food Insecurity:   . Worried About Charity fundraiser in the Last Year:   . Arboriculturist in the Last Year:   Transportation Needs:   . Film/video editor (Medical):   Marland Kitchen Lack of Transportation (Non-Medical):   Physical Activity:   . Days of Exercise per Week:   . Minutes of Exercise per Session:   Stress:   . Feeling of Stress :   Social Connections:   . Frequency of Communication with Friends and Family:   .  Frequency of Social Gatherings with Friends and Family:   . Attends Religious Services:   . Active Member of Clubs or Organizations:   . Attends Archivist Meetings:   Marland Kitchen Marital Status:   Intimate Partner Violence:   . Fear of Current or Ex-Partner:   . Emotionally Abused:   Marland Kitchen Physically Abused:   . Sexually Abused:    Review of Systems Itching is slowly improving---?related to a new medication Now off amiodarone--no palpitations    Objective:   Physical Exam Constitutional:      Appearance: Normal appearance.  Cardiovascular:     Rate and Rhythm: Normal rate and regular rhythm.     Heart sounds: No murmur heard.  No gallop.   Pulmonary:     Effort: Pulmonary effort is normal.     Breath  sounds: Normal breath sounds. No wheezing or rales.  Abdominal:     Palpations: Abdomen is soft.     Tenderness: There is no abdominal tenderness.  Musculoskeletal:     Right lower leg: No edema.     Left lower leg: No edema.  Skin:    Findings: No rash.  Neurological:     Mental Status: He is alert.  Psychiatric:        Mood and Affect: Mood normal.        Behavior: Behavior normal.            Assessment & Plan:

## 2020-01-16 NOTE — Assessment & Plan Note (Signed)
S/P CABG Asks about viagra---needs to wait till after cardiac rehab and clearance from Dr [name]

## 2020-01-16 NOTE — Assessment & Plan Note (Signed)
Seems to have resolved Unclear etiology Will continue the PPI for 3 months Recheck CBC

## 2020-01-16 NOTE — Assessment & Plan Note (Signed)
Post op without apparent recurrence On ASA only Off amio

## 2020-01-16 NOTE — Assessment & Plan Note (Signed)
Seems to be compensated On entresto and diuretic Will start cardiac rehab and CHF clinic

## 2020-01-20 ENCOUNTER — Other Ambulatory Visit: Payer: Self-pay

## 2020-01-20 ENCOUNTER — Encounter: Payer: Self-pay | Admitting: Cardiovascular Disease

## 2020-01-20 ENCOUNTER — Ambulatory Visit: Payer: PPO | Admitting: Cardiovascular Disease

## 2020-01-20 DIAGNOSIS — I10 Essential (primary) hypertension: Secondary | ICD-10-CM | POA: Diagnosis not present

## 2020-01-20 DIAGNOSIS — I480 Paroxysmal atrial fibrillation: Secondary | ICD-10-CM

## 2020-01-20 DIAGNOSIS — E782 Mixed hyperlipidemia: Secondary | ICD-10-CM

## 2020-01-20 DIAGNOSIS — I5022 Chronic systolic (congestive) heart failure: Secondary | ICD-10-CM | POA: Diagnosis not present

## 2020-01-20 DIAGNOSIS — Z951 Presence of aortocoronary bypass graft: Secondary | ICD-10-CM | POA: Diagnosis not present

## 2020-01-20 NOTE — Assessment & Plan Note (Signed)
Chronic systolic heart failure with an EF in the 30% range now 6 weeks post CABG.  He is on Entresto but not on a beta-blocker because of post pericardiotomy syndrome and shock.  His blood pressures are adequate.  Heart rate is 82.  I believe he would benefit from being on low-dose carvedilol but I will defer to Dr. Haroldine Laws in the heart failure clinic who scheduled to see him later this month.

## 2020-01-20 NOTE — Progress Notes (Signed)
01/20/2020 James Braun   [birth year]  [id number]  Primary Physician [name], MD Primary Cardiologist: [name] MD Lupe Carney, [place]  HPI:  James Braun is a 79 y.o.  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 He has a 96% success rate. He was referred by Dr. Annamarie Major for cardiovascular evaluation because of new onset chest pain.I last saw him in the office  11/21/2019.He does have a history of treated hypertension, diabetes and hyperlipidemia. He is never had a heart attack or stroke. He has no other cardiac risk factors. He had a stressful event in his life 2 and half weeks ago chest pain subsequent to that and daily since. The pain is not necessarily occur with exertion. Itoccurredrandomly lastedminutes at a time without radiation. He does exercise 3 days a week to 30 minutes at a time on the treadmill and does intermittent fasting as well.  I performed nuclear stress testing 01/09/2018 which was low risk and nonischemic and obtain a 2D echocardiogram 01/07/2018 which was essentially unremarkable as well. He continues to exercise, and is active without limitation. His only complaint is he does not have the energy that he used to.  He was recently evaluated underwent endoscopy revealing a large hiatal hernia.  Is being considered for laparoscopic hiatal hernia repair.  He was referred back to me by his gastroenterologist because of an exertional component to his chest pain to rule out an ischemic etiology.  I ultimately performed left heart cath on him 11/27/2019 revealing severe two-vessel disease with moderately severe LV dysfunction.  His EF was in the 30% range with severe inferior hypokinesia.  He ultimately underwent CABG x3 by Dr. Darcey Nora on 11/28/2019 with LIMA to the LAD, vein to a diagonal branch and posterior lateral branch.  He developed post  cardiotomy cardiogenic shock requiring a short course of milrinone.  Also had some perioperative A. fib.  He was placed on oral anticoagulation, digoxin and amiodarone both of which have been since discontinued.  Was complaining of some diffuse itching.  He is scheduled to see a Pharm.D. in the near future to discuss alternatives to statin therapy such as PCSK9.  He is also scheduled to begin cardiac rehab in August.  He slowly recuperating.  Hopefully, with the addition of carvedilol and revascularization his EF will improve towards normal.  This is being followed by Dr. Haroldine Laws  in the heart failure clinic.    Current Meds  Medication Sig  . alum hydroxide-mag trisilicate (GAVISCON) 28-41 MG CHEW chewable tablet Chew 2 tablets by mouth 3 (three) times daily as needed for indigestion or heartburn.  Marland Kitchen aspirin EC 81 MG EC tablet Take 1 tablet (81 mg total) by mouth daily.  . Cholecalciferol (VITAMIN D3) 250 MCG (10000 UT) TABS Take 1 tablet by mouth 2 (two) times daily.  . Cyanocobalamin (VITAMIN B-12) 1000 MCG SUBL Place under the tongue.  . finasteride (PROSCAR) 5 MG tablet Take 1 tablet (5 mg total) by mouth daily.  . metFORMIN (GLUCOPHAGE-XR) 500 MG 24 hr tablet Take 1 tablet (500 mg total) by mouth 2 (two) times daily.  . Multiple Vitamin (MULTIVITAMIN) tablet Take 1 tablet by mouth daily.  Marland Kitchen omeprazole (PRILOSEC) 20 MG capsule Take 20 mg by mouth daily.  . Potassium Citrate 15 MEQ (1620 MG) TBCR Take 1 tablet by mouth in the morning and at  bedtime.  . sacubitril-valsartan (ENTRESTO) 24-26 MG Take 1 tablet by mouth 2 (two) times daily.  Marland Kitchen spironolactone (ALDACTONE) 25 MG tablet Take 1 tablet (25 mg total) by mouth daily.  . timolol (TIMOPTIC) 0.5 % ophthalmic solution Place 1 drop into both eyes 2 (two) times daily.      Allergies  Allergen Reactions  . Atorvastatin Other (See Comments)    myalgias  . Morphine And Related Other (See Comments)     hypotension  . Levofloxacin Rash    . Septra [Sulfamethoxazole-Trimethoprim] Rash    Social History   Socioeconomic History  . Marital status: Married    Spouse name: Not on file  . Number of children: 2  . Years of education: Not on file  . Highest education level: Not on file  Occupational History  . 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 Occupation: Buys very distressed houses and rehabilitates  . Occupation: Does mediation  Tobacco Use  . Smoking status: Never Smoker  . Smokeless tobacco: Never Used  Substance and Sexual Activity  . Alcohol use: No  . Drug use: No  . Sexual activity: Not on file  Other Topics Concern  . Not on file  Social History Narrative   Has living will   Wife is health care POA---alternate is committee of physicians   Would accept resuscitation---wouldn't accept prolonged ventilation   No tube feeds if cognitively unaware   Social Determinants of Health   Financial Resource Strain:   . Difficulty of Paying Living Expenses:   Food Insecurity:   . Worried About Charity fundraiser in the Last Year:   . Arboriculturist in the Last Year:   Transportation Needs:   . Film/video editor (Medical):   Marland Kitchen Lack of Transportation (Non-Medical):   Physical Activity:   . Days of Exercise per Week:   . Minutes of Exercise per Session:   Stress:   . Feeling of Stress :   Social Connections:   . Frequency of Communication with Friends and Family:   . Frequency of Social Gatherings with Friends and Family:   . Attends Religious Services:   . Active Member of Clubs or Organizations:   . Attends Archivist Meetings:   Marland Kitchen Marital Status:   Intimate Partner Violence:   . Fear of Current or Ex-Partner:   . Emotionally Abused:   Marland Kitchen Physically Abused:   . Sexually Abused:      Review of Systems: General: negative for chills, fever, night sweats or weight changes.  Cardiovascular: negative for chest pain, dyspnea on exertion, edema, orthopnea, palpitations, paroxysmal  nocturnal dyspnea or shortness of breath Dermatological: negative for rash Respiratory: negative for cough or wheezing Urologic: negative for hematuria Abdominal: negative for nausea, vomiting, diarrhea, bright red blood per rectum, melena, or hematemesis Neurologic: negative for visual changes, syncope, or dizziness All other systems reviewed and are otherwise negative except as noted above.    Blood pressure 124/76, pulse 88, height 5\' 7"  (1.702 m), weight 148 lb 9.6 oz (67.4 kg), SpO2 98 %.  General appearance: alert and no distress Neck: no adenopathy, no carotid bruit, no JVD, supple, symmetrical, trachea midline and thyroid not enlarged, symmetric, no tenderness/mass/nodules Lungs: clear to auscultation bilaterally Heart: regular rate and rhythm, S1, S2 normal, no murmur, click, rub or gallop Extremities: extremities normal, atraumatic, no cyanosis or edema Pulses: 2+ and symmetric Skin: Skin color, texture, turgor normal. No rashes or lesions Neurologic: Alert and oriented X 3,  normal strength and tone. Normal symmetric reflexes. Normal coordination and gait  EKG not performed today  ASSESSMENT AND PLAN:   Hyperlipemia History of hyperlipidemia intolerant to statin therapy with lipid profile performed 09/22/2019 revealing total cholesterol 172, LDL 107 and HDL of 32.  He does have an appointment to speak with Pharm.D. in the upcoming future to discuss other options including PCSK9 therapy.  Essential hypertension, benign History of essential hypertension with blood pressure measured today 124/76.  He is on Entresto and Aldactone.  S/P CABG x 3 History of CAD status post CABG x3 by Dr. Darcey Nora 11/28/2019 with a LIMA to the LAD, vein to diagonal branch and posterior lateral branch after cardiac catheterization performed by myself 11/27/2019 revealed severe two-vessel disease with moderately severe LV dysfunction.  His EF was in the 25 to 35% range.  He is slowly recuperating.  He  denies chest pain or shortness of breath.  Heart failure, systolic, chronic (HCC) Chronic systolic heart failure with an EF in the 30% range now 6 weeks post CABG.  He is on Entresto but not on a beta-blocker because of post pericardiotomy syndrome and shock.  His blood pressures are adequate.  Heart rate is 82.  I believe he would benefit from being on low-dose carvedilol but I will defer to Dr. Haroldine Laws in the heart failure clinic who scheduled to see him later this month.  PAF (paroxysmal atrial fibrillation) (HCC) History of PAF perioperative.  On oral anticoagulant and amiodarone both of which have been discontinued without recurrence of atrial fibrillation.  He was complaining of some diffuse itching which may be related to either the digoxin or amiodarone both of which have been discontinued.      [name] MD FACP,FACC,FAHA, [facility] 01/20/2020 11:04 AM

## 2020-01-20 NOTE — Assessment & Plan Note (Signed)
History of essential hypertension with blood pressure measured today 124/76.  He is on Entresto and Aldactone.

## 2020-01-20 NOTE — Assessment & Plan Note (Signed)
History of hyperlipidemia intolerant to statin therapy with lipid profile performed 09/22/2019 revealing total cholesterol 172, LDL 107 and HDL of 32.  He does have an appointment to speak with Pharm.D. in the upcoming future to discuss other options including PCSK9 therapy.

## 2020-01-20 NOTE — Patient Instructions (Signed)
Medication Instructions:  Your physician recommends that you continue on your current medications as directed. Please refer to the Current Medication list given to you today.  *If you need a refill on your cardiac medications before your next appointment, please call your pharmacy*  Lab Work: NONE   Testing/Procedures: NONE  Follow-Up: At [facility], you and your health needs are our priority.  As part of our continuing mission to provide you with exceptional heart care, we have created designated Provider Care Teams.  These Care Teams include your primary Cardiologist (physician) and Advanced Practice Providers (APPs -  Physician Assistants and Nurse Practitioners) who all work together to provide you with the care you need, when you need it.  We recommend signing up for the patient portal called "MyChart".  Sign up information is provided on this After Visit Summary.  MyChart is used to connect with patients for Virtual Visits (Telemedicine).  Patients are able to view lab/test results, encounter notes, upcoming appointments, etc.  Non-urgent messages can be sent to your provider as well.   To learn more about what you can do with MyChart, go to [email or web address].    Your next appointment:   6 month(s)  The format for your next appointment:   In Person  Provider:   You may see Quay Burow, MD or one of the following Advanced Practice Providers on your designated Care Team:    Kerin Ransom, PA-C  [place], [place]  Coletta Memos, [place]

## 2020-01-20 NOTE — Assessment & Plan Note (Signed)
History of CAD status post CABG x3 by Dr. Darcey Nora 11/28/2019 with a LIMA to the LAD, vein to diagonal branch and posterior lateral branch after cardiac catheterization performed by myself 11/27/2019 revealed severe two-vessel disease with moderately severe LV dysfunction.  His EF was in the 25 to 35% range.  He is slowly recuperating.  He denies chest pain or shortness of breath.

## 2020-01-20 NOTE — Assessment & Plan Note (Signed)
History of PAF perioperative.  On oral anticoagulant and amiodarone both of which have been discontinued without recurrence of atrial fibrillation.  He was complaining of some diffuse itching which may be related to either the digoxin or amiodarone both of which have been discontinued.

## 2020-02-05 ENCOUNTER — Other Ambulatory Visit: Payer: Self-pay | Admitting: Gastroenterology

## 2020-02-09 ENCOUNTER — Telehealth (HOSPITAL_COMMUNITY): Payer: Self-pay | Admitting: Pharmacist

## 2020-02-09 NOTE — Telephone Encounter (Signed)
Cardiac Rehab Medication Review by a Pharmacist  Does the patient  feel that his/her medications are working for him/her?  no  Has the patient been experiencing any side effects to the medications prescribed?  yes  Does the patient measure his/her own blood pressure or blood glucose at home?  no   Does the patient have any problems obtaining medications due to transportation or finances?   no  Understanding of regimen: good Understanding of indications: good Potential of compliance: good    Pharmacist Intervention: Patient is very concerned about his current medication regimen for multiple reasons. He revealed that recently he has been very unstable on his feet and has even fallen and injured the side of his abdomen. He believed that this is due to his new Entresto medication. I recommended the patient begin to measure his blood pressure and indicated he could do so for free at [facility] and also counseled that hypotension (combo of spironolactone and Entresto) is very possible and could have caused the lightheadedness. Counseled on other signs of hypotension. The patient is also reporting dark stools. He indicated that his PCP is aware of these stools and he hopes to schedule a colonoscopy soon. Counseled the patient on signs of bleeding and to let his PCP know that this is occurring. Recommend addressing the patients potential hypotensive episodes as well as the dark stools immediately.     James Braun 02/09/2020 3:07 PM

## 2020-02-10 ENCOUNTER — Other Ambulatory Visit: Payer: Self-pay

## 2020-02-10 ENCOUNTER — Ambulatory Visit (INDEPENDENT_AMBULATORY_CARE_PROVIDER_SITE_OTHER): Payer: PPO | Admitting: Pharmacist

## 2020-02-10 ENCOUNTER — Telehealth: Payer: Self-pay | Admitting: Gastroenterology

## 2020-02-10 VITALS — BP 120/76 | HR 83

## 2020-02-10 DIAGNOSIS — I2510 Atherosclerotic heart disease of native coronary artery without angina pectoris: Secondary | ICD-10-CM | POA: Diagnosis not present

## 2020-02-10 DIAGNOSIS — G720 Drug-induced myopathy: Secondary | ICD-10-CM | POA: Diagnosis not present

## 2020-02-10 DIAGNOSIS — E782 Mixed hyperlipidemia: Secondary | ICD-10-CM | POA: Diagnosis not present

## 2020-02-10 DIAGNOSIS — T466X5A Adverse effect of antihyperlipidemic and antiarteriosclerotic drugs, initial encounter: Secondary | ICD-10-CM | POA: Diagnosis not present

## 2020-02-10 MED ORDER — ROSUVASTATIN CALCIUM 10 MG PO TABS
10.0000 mg | ORAL_TABLET | Freq: Every day | ORAL | 11 refills | Status: DC
Start: 2020-02-10 — End: 2020-03-09

## 2020-02-10 NOTE — Progress Notes (Signed)
Patient ID: James Braun                 DOB: January 02, 1941                    MRN: [id number]     HPI: James Braun is a 79 y.o. male patient referred to lipid clinic by Dr [name]. PMH is significant for chest pain, HTN, DM, and HLD. He underwent left heart cath on 11/27/19 which revealed severe 2 vessel disease with moderately severe LV dysfunction. Post op echo 5/21 showed EF of 35% with diffuse hypokinesis. Being followed in advanced CHF clinic. He underwent CABG x3 on 11/28/19 with LIMA to LAD, vein to a diagonal branch and posterior lateral branch. He has statin intolerance listed in his profile and was referred to lipid clinic to discuss alternative medications.  Pt's main concerns today are regarding need for colonoscopy and dizziness he has been experiencing since starting Entresto and spironolactone for CHF. He will need cardiac clearance for his colonoscopy given recent CABG. He tripped last week and fell, EMS was called, BP was normal, no other issues noted. Pt reports previously taking atorvastatin which caused myalgias and fatigue after a few weeks. He has not tried any other lipid lowering therapy.   Current Medications: none Intolerances: atorvastatin - myalgias, felt exhausted after a few weeks Risk Factors: CAD s/p CABG, DM, CHF LDL goal: 55mg /dL  Diet: wife has been cooking heart healthy diet at home  Exercise: 3 days a week for 30 minutes on the treadmill. Starting cardiac rehab in August.  Family History: Mother with HTN, father with DM and HTN.  Social History: Denies tobacco, alcohol, and illicit drug use  Labs: 01/16/20: TC 188, TG 146, HDL 33.2, LDL 125 (no lipid lowering therapy)  Past Medical History:  Diagnosis Date  . BPH (benign prostatic hypertrophy)   . Coronary artery disease   . History of kidney stones   . History of melanoma excision OCT 2010  . Hyperlipidemia   . Hypertension   . Left ureteral calculus   . Nephrolithiasis    bilateral  . Type 2  diabetes mellitus ([place])     Current Outpatient Medications on File Prior to Visit  Medication Sig Dispense Refill  . alum hydroxide-mag trisilicate (GAVISCON) 76-54 MG CHEW chewable tablet Chew 2 tablets by mouth 3 (three) times daily as needed for indigestion or heartburn.    Marland Kitchen aspirin EC 81 MG EC tablet Take 1 tablet (81 mg total) by mouth daily.    . Cholecalciferol (VITAMIN D3) 250 MCG (10000 UT) TABS Take 1 tablet by mouth 2 (two) times daily.    . Cyanocobalamin (VITAMIN B-12) 1000 MCG SUBL Place under the tongue.    . finasteride (PROSCAR) 5 MG tablet Take 1 tablet (5 mg total) by mouth daily. 90 tablet 3  . metFORMIN (GLUCOPHAGE-XR) 500 MG 24 hr tablet Take 1 tablet (500 mg total) by mouth 2 (two) times daily. 180 tablet 3  . Multiple Vitamin (MULTIVITAMIN) tablet Take 1 tablet by mouth daily.    Marland Kitchen omeprazole (PRILOSEC) 20 MG capsule TAKE 1 CAPSULE BY MOUTH TWICE A DAY BEFORE A MEAL (Patient not taking: Reported on 02/09/2020) 60 capsule 1  . Potassium Citrate 15 MEQ (1620 MG) TBCR Take 1 tablet by mouth in the morning and at bedtime.    . sacubitril-valsartan (ENTRESTO) 24-26 MG Take 1 tablet by mouth 2 (two) times daily. 60 tablet 3  . spironolactone (ALDACTONE)  25 MG tablet Take 1 tablet (25 mg total) by mouth daily. 30 tablet 3  . timolol (TIMOPTIC) 0.5 % ophthalmic solution Place 1 drop into both eyes 2 (two) times daily.      No current facility-administered medications on file prior to visit.    Allergies  Allergen Reactions  . Atorvastatin Other (See Comments)    myalgias  . Morphine And Related Other (See Comments)     hypotension  . Levofloxacin Rash  . Septra [Sulfamethoxazole-Trimethoprim] Rash    Assessment/Plan:  1. Hyperlipidemia - LDL 125 above goal < 55 due to history of ASCVD and DM. Discussed statin rechallenge as pt has only tried atorvastatin which also has a higher incidence of causing myalgias due to its lipophilicity. Pt is willing to try rosuvastatin  10mg  daily, however he wishes to sort through his dizziness and GI issues first. I called over to [facility] and they will fax colonoscopy clearance request form to our office (pt on aspirin due to recent CABG and will require cardiac clearance). Encouraged pt to keep f/u appt in CHF clinic in 2 days. Unsure if dizziness is related to CHF meds as BP was normal today. Will call pt in 1 month to follow up with statin tolerability and schedule labs. If needed, can pursue Nexlizet or PCSK9i, but he requires rechallenge with 2nd statin before insurance will cover either of these options.  Satvik Parco E. Pristine Gladhill, PharmD, BCACP, [place] [address] [place], [place] [address] Phone: [phone]; Fax: [phone] 02/10/2020 12:16 PM  Addendum: Damaris Schooner with [facility], they stated pt is not scheduled for a colonoscopy and does not have an appt scheduled with them. He would need to be seen first before he has a colonoscopy scheduled. Called pt who states he is aware and wanted cardiac clearance first. Advised this is typically provided once we receive clearance request. Advised pt to discuss at appt with Dr [name] next week, he verbalized understanding.

## 2020-02-10 NOTE — Telephone Encounter (Signed)
Megan from [facility] is requesting a clearance form to be faxed over to clear the pt for a colonoscopy that he needs.   [facility] Fax: [phone]

## 2020-02-10 NOTE — Telephone Encounter (Signed)
Spoke with James Braun and informed her that the patient is not scheduled for a colonoscopy or any procedure at this time.

## 2020-02-10 NOTE — Patient Instructions (Addendum)
It was nice to meet you today  Your LDL is 125 and your goal is < 55  Start taking rosuvastatin 10mg  daily. This will lower your LDL by 30-50%  I'll give you a call in a month to see how you are feeling  We will plan to recheck your cholesterol once you have been on medication for 2-3 months

## 2020-02-12 ENCOUNTER — Ambulatory Visit (HOSPITAL_COMMUNITY)
Admission: RE | Admit: 2020-02-12 | Discharge: 2020-02-12 | Disposition: A | Discharge status: Home / Self Care | Payer: PPO | Source: Ambulatory Visit | Attending: Internal Medicine | Admitting: Internal Medicine

## 2020-02-12 ENCOUNTER — Other Ambulatory Visit: Payer: Self-pay

## 2020-02-12 VITALS — BP 122/80 | HR 93 | Wt 152.2 lb

## 2020-02-12 DIAGNOSIS — S0083XA Contusion of other part of head, initial encounter: Secondary | ICD-10-CM | POA: Insufficient documentation

## 2020-02-12 DIAGNOSIS — Z833 Family history of diabetes mellitus: Secondary | ICD-10-CM | POA: Diagnosis not present

## 2020-02-12 DIAGNOSIS — I5022 Chronic systolic (congestive) heart failure: Secondary | ICD-10-CM | POA: Diagnosis not present

## 2020-02-12 DIAGNOSIS — E785 Hyperlipidemia, unspecified: Secondary | ICD-10-CM | POA: Insufficient documentation

## 2020-02-12 DIAGNOSIS — Z79899 Other long term (current) drug therapy: Secondary | ICD-10-CM | POA: Diagnosis not present

## 2020-02-12 DIAGNOSIS — Y9289 Other specified places as the place of occurrence of the external cause: Secondary | ICD-10-CM | POA: Insufficient documentation

## 2020-02-12 DIAGNOSIS — I2510 Atherosclerotic heart disease of native coronary artery without angina pectoris: Secondary | ICD-10-CM | POA: Diagnosis not present

## 2020-02-12 DIAGNOSIS — N400 Enlarged prostate without lower urinary tract symptoms: Secondary | ICD-10-CM | POA: Insufficient documentation

## 2020-02-12 DIAGNOSIS — I493 Ventricular premature depolarization: Secondary | ICD-10-CM | POA: Insufficient documentation

## 2020-02-12 DIAGNOSIS — Z85820 Personal history of malignant melanoma of skin: Secondary | ICD-10-CM | POA: Insufficient documentation

## 2020-02-12 DIAGNOSIS — Z8249 Family history of ischemic heart disease and other diseases of the circulatory system: Secondary | ICD-10-CM | POA: Diagnosis not present

## 2020-02-12 DIAGNOSIS — I480 Paroxysmal atrial fibrillation: Secondary | ICD-10-CM | POA: Diagnosis not present

## 2020-02-12 DIAGNOSIS — Z951 Presence of aortocoronary bypass graft: Secondary | ICD-10-CM | POA: Diagnosis not present

## 2020-02-12 DIAGNOSIS — I110 Hypertensive heart disease with heart failure: Secondary | ICD-10-CM | POA: Diagnosis not present

## 2020-02-12 DIAGNOSIS — Z882 Allergy status to sulfonamides status: Secondary | ICD-10-CM | POA: Insufficient documentation

## 2020-02-12 DIAGNOSIS — G9389 Other specified disorders of brain: Secondary | ICD-10-CM | POA: Diagnosis not present

## 2020-02-12 DIAGNOSIS — Z881 Allergy status to other antibiotic agents status: Secondary | ICD-10-CM | POA: Insufficient documentation

## 2020-02-12 DIAGNOSIS — W19XXXA Unspecified fall, initial encounter: Secondary | ICD-10-CM

## 2020-02-12 DIAGNOSIS — I9789 Other postprocedural complications and disorders of the circulatory system, not elsewhere classified: Secondary | ICD-10-CM | POA: Diagnosis not present

## 2020-02-12 DIAGNOSIS — S0990XA Unspecified injury of head, initial encounter: Secondary | ICD-10-CM | POA: Diagnosis not present

## 2020-02-12 DIAGNOSIS — G319 Degenerative disease of nervous system, unspecified: Secondary | ICD-10-CM | POA: Diagnosis not present

## 2020-02-12 DIAGNOSIS — Z7982 Long term (current) use of aspirin: Secondary | ICD-10-CM | POA: Insufficient documentation

## 2020-02-12 DIAGNOSIS — I255 Ischemic cardiomyopathy: Secondary | ICD-10-CM | POA: Diagnosis not present

## 2020-02-12 DIAGNOSIS — Z7984 Long term (current) use of oral hypoglycemic drugs: Secondary | ICD-10-CM | POA: Diagnosis not present

## 2020-02-12 DIAGNOSIS — E119 Type 2 diabetes mellitus without complications: Secondary | ICD-10-CM | POA: Diagnosis not present

## 2020-02-12 DIAGNOSIS — Z885 Allergy status to narcotic agent status: Secondary | ICD-10-CM | POA: Insufficient documentation

## 2020-02-12 DIAGNOSIS — W010XXA Fall on same level from slipping, tripping and stumbling without subsequent striking against object, initial encounter: Secondary | ICD-10-CM | POA: Insufficient documentation

## 2020-02-12 NOTE — Patient Instructions (Signed)
CT Scan of Head scheduled for 12:00 pm TODAY at [facility]   **Please arrive to their Radiology Department at 11:45 AM for check-in**  Your physician recommends that you schedule a follow-up appointment in: 3 months  If you have any questions or concerns before your next appointment please send [place] a message through [place] or call our office at [phone].    TO LEAVE A MESSAGE FOR THE NURSE SELECT OPTION 2, PLEASE LEAVE A MESSAGE INCLUDING: . YOUR NAME . DATE OF BIRTH . CALL BACK NUMBER . REASON FOR CALL**this is important as we prioritize the call backs  [place] AS LONG AS YOU CALL BEFORE 4:00 PM  At the [facility], you and your health needs are our priority. As part of our continuing mission to provide you with exceptional heart care, we have created designated Provider Care Teams. These Care Teams include your primary Cardiologist (physician) and Advanced Practice Providers (APPs- Physician Assistants and Nurse Practitioners) who all work together to provide you with the care you need, when you need it.   You may see any of the following providers on your designated Care Team at your next follow up: Marland Kitchen Dr Glori Bickers . Dr Loralie Champagne . Darrick Grinder, NP . Lyda Jester, PA . Audry Riles, PharmD   Please be sure to bring in all your medications bottles to every appointment.

## 2020-02-12 NOTE — Progress Notes (Addendum)
Advanced Heart Failure Clinic Note   Referring Physician: PCP: [name], MD PCP-Cardiologist: Quay Burow, MD  [facility]: Dr. Haroldine Laws   Reason for Visit: [facility] F/u, Chronic Systolic Heart Failure/ Ischemic Cardiomyopathy   HPI: [name] presents to clinic today for post hospital f/u.    79 y/o male w/ HTN, HLD, T2DM admitted 5/21 for LHC in the setting of new CP and abnormal NST. LHC showed severe multivessel CAD and he was referred for CABG. Preop echo showed moderately reduced LVEF, 35-40%, RV normal. No significant valvular disease. Underwent CABG x 3 (LIMA-LAD, SVG-diag, SVG-PLB) on 5/14 by Dr. [name].    Post operative course c/b atrial fibrillation + post cardiotomy shock requiring amiodarone and milrinone. Post op echo showed EF ~35% and mildly reduced RV systolic function.   He is here today for routine follow-up. Was feeling great up until 1 week ago. Was out in the courtyard and tripped over a water hose and fell and hit his head hard. No syncope or palpitations - mechanical fall only. No LOC. Was bleeding from scalp and his mouth. EMS came and vitals ok. He told them he felt he didn't need to go to ER. Since that time balance has been off and he has felt like he has been in a brain fog. Denies SOB, edema, orthopnea or PND. No longer on Eliquis ( PCP took him off of Eliquis due to 1 episode of dark stools, which resolved after discontinuation) but taking ASA.   Echo 5/21: LVEF 35% w/ diffuse hypokinesis, RV systolic function mildly reduced   Review of Systems: Pertinent +/- outline in HPI    Past Medical History:  Diagnosis Date  . BPH (benign prostatic hypertrophy)   . Coronary artery disease   . History of kidney stones   . History of melanoma excision OCT 2010  . Hyperlipidemia   . Hypertension   . Left ureteral calculus   . Nephrolithiasis    bilateral  . Type 2 diabetes mellitus ([place])     Current Outpatient Medications  Medication Sig  Dispense Refill  . alum hydroxide-mag trisilicate (GAVISCON) 69-62 MG CHEW chewable tablet Chew 2 tablets by mouth 3 (three) times daily as needed for indigestion or heartburn.    Marland Kitchen aspirin EC 81 MG EC tablet Take 1 tablet (81 mg total) by mouth daily.    . Cholecalciferol (VITAMIN D3) 250 MCG (10000 UT) TABS Take 1 tablet by mouth 2 (two) times daily.    . Cyanocobalamin (VITAMIN B-12) 1000 MCG SUBL Place under the tongue.    . finasteride (PROSCAR) 5 MG tablet Take 1 tablet (5 mg total) by mouth daily. 90 tablet 3  . metFORMIN (GLUCOPHAGE-XR) 500 MG 24 hr tablet Take 1 tablet (500 mg total) by mouth 2 (two) times daily. 180 tablet 3  . Multiple Vitamin (MULTIVITAMIN) tablet Take 1 tablet by mouth daily.    Marland Kitchen omeprazole (PRILOSEC) 20 MG capsule Take 20 mg by mouth as needed.    . Potassium Citrate 15 MEQ (1620 MG) TBCR Take 1 tablet by mouth in the morning and at bedtime.    . rosuvastatin (CRESTOR) 10 MG tablet Take 1 tablet (10 mg total) by mouth daily. 30 tablet 11  . sacubitril-valsartan (ENTRESTO) 24-26 MG Take 1 tablet by mouth 2 (two) times daily. 60 tablet 3  . spironolactone (ALDACTONE) 25 MG tablet Take 1 tablet (25 mg total) by mouth daily. 30 tablet 3  . timolol (TIMOPTIC) 0.5 % ophthalmic solution  Place 1 drop into both eyes 2 (two) times daily.      No current facility-administered medications for this encounter.    Allergies  Allergen Reactions  . Atorvastatin Other (See Comments)    myalgias  . Morphine And Related Other (See Comments)     hypotension  . Levofloxacin Rash  . Septra [Sulfamethoxazole-Trimethoprim] Rash      Social History   Socioeconomic History  . Marital status: Married    Spouse name: Not on file  . Number of children: 2  . Years of education: Not on file  . Highest education level: Not on file  Occupational History  . 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 Occupation: Buys very distressed houses and rehabilitates  . Occupation: Does  mediation  Tobacco Use  . Smoking status: Never Smoker  . Smokeless tobacco: Never Used  Substance and Sexual Activity  . Alcohol use: No  . Drug use: No  . Sexual activity: Not on file  Other Topics Concern  . Not on file  Social History Narrative   Has living will   Wife is health care POA---alternate is committee of physicians   Would accept resuscitation---wouldn't accept prolonged ventilation   No tube feeds if cognitively unaware   Social Determinants of Health   Financial Resource Strain:   . Difficulty of Paying Living Expenses:   Food Insecurity:   . Worried About Charity fundraiser in the Last Year:   . Arboriculturist in the Last Year:   Transportation Needs:   . Film/video editor (Medical):   Marland Kitchen Lack of Transportation (Non-Medical):   Physical Activity:   . Days of Exercise per Week:   . Minutes of Exercise per Session:   Stress:   . Feeling of Stress :   Social Connections:   . Frequency of Communication with Friends and Family:   . Frequency of Social Gatherings with Friends and Family:   . Attends Religious Services:   . Active Member of Clubs or Organizations:   . Attends Archivist Meetings:   Marland Kitchen Marital Status:   Intimate Partner Violence:   . Fear of Current or Ex-Partner:   . Emotionally Abused:   Marland Kitchen Physically Abused:   . Sexually Abused:       Family History  Problem Relation Age of Onset  . Hypertension Mother   . Diabetes Father   . Hypertension Father   . Coronary artery disease Neg Hx   . Cancer Neg Hx     Vitals:   02/12/20 0944  BP: 122/80  Pulse: 93  SpO2: 97%  Weight: 69 kg (152 lb 3.2 oz)     PHYSICAL EXAM: General:  Elderly male  HEENT: mild facial asymmetry with bruising on right temporal region Neck: supple. no JVD. Carotids 2+ bilat; no bruits. No lymphadenopathy or thyromegaly appreciated. Cor: PMI nondisplaced. Regular rate & rhythm. No rubs, gallops or murmurs. Lungs: clear Abdomen: soft,  nontender, nondistended. No hepatosplenomegaly. No bruits or masses. Good bowel sounds. Extremities: no cyanosis, clubbing, rash, edema Neuro: alert & oriented x 3, cranial nerves grossly intact. moves all 4 extremities w/o difficulty. Affect pleasant. Mild swaying with Romberg. Difficulty with heel-to-toe walk. FTN ok. No drift    ASSESSMENT & PLAN:  1. CAD - s/p CABG x 3 5/14 (LIMA-LAD, SVG-dig, SVG- PLB) - no s/s angina today - continue ASA + statin  - no  blocker w/ recent shock. Can likely start soon - Refer CR  2.Chronic Systolic HF/ Ischemic Cardiomyopathy  - Echo 5/21 LVEF 01%-60%, RV systolic function mildly reduced, in the setting of multivessel CAD s/p CABG - Developed Post Cardiotomy Cardiogenic Shock following CABG and required short course of milrinone.   - Volume status stable. Euvolemic on exam - NYHA Class II (prior to head injury) - Continue digoxin 0.125 mg. - Continue Entresto 24-26 bid. - Continue Spironolactone 25 mg daily  - Continue PRN Lasix 20 mg  - no  blocker yet w/ recent shock  - consider addition of SGLT2i once Neuro situation stable   4. Post-operative Afib - maintaining NSR on amiodarone  - continue amio 100 daily (continuing for PVC suppression)  - no longer on Eliquis (d/c by PCP due to melena, now resolved)  5. PVCs - noted to have frequent PVCs pos operatively  -  suppressed w/ amiodarone. Continue 100 mg daily   6. Head injury with imbalance  - unclear if due to [place] or post-concussive state. Will need stat head CT today.    Glori Bickers, MD 02/12/20

## 2020-02-16 ENCOUNTER — Other Ambulatory Visit: Payer: Self-pay

## 2020-02-16 ENCOUNTER — Encounter (HOSPITAL_COMMUNITY)
Admission: RE | Admit: 2020-02-16 | Discharge: 2020-02-16 | Disposition: A | Discharge status: Home / Self Care | Payer: PPO | Source: Ambulatory Visit | Attending: Cardiovascular Disease | Admitting: Cardiovascular Disease

## 2020-02-16 DIAGNOSIS — I480 Paroxysmal atrial fibrillation: Secondary | ICD-10-CM | POA: Insufficient documentation

## 2020-02-16 DIAGNOSIS — I5022 Chronic systolic (congestive) heart failure: Secondary | ICD-10-CM | POA: Insufficient documentation

## 2020-02-16 DIAGNOSIS — E785 Hyperlipidemia, unspecified: Secondary | ICD-10-CM | POA: Insufficient documentation

## 2020-02-16 DIAGNOSIS — Z951 Presence of aortocoronary bypass graft: Secondary | ICD-10-CM | POA: Insufficient documentation

## 2020-02-16 DIAGNOSIS — Z79899 Other long term (current) drug therapy: Secondary | ICD-10-CM | POA: Insufficient documentation

## 2020-02-16 DIAGNOSIS — I110 Hypertensive heart disease with heart failure: Secondary | ICD-10-CM | POA: Insufficient documentation

## 2020-02-16 NOTE — Progress Notes (Signed)
Cardiac Rehab Telephone Note:  Successful telephone encounter to James Braun to confirm Cardiac Rehab orientation appointment for 02/17/20 at 2:00. Nursing assessment completed. Patient questions answered. Instructions for appointment provided. Patient screening for Covid-19 negative.  Keontae Levingston E. Rollene Rotunda RN, BSN [place]  Cardiac and Pulmonary Rehabilitation Phone: [phone] Fax: [phone]

## 2020-02-17 ENCOUNTER — Encounter (HOSPITAL_COMMUNITY)
Admission: RE | Admit: 2020-02-17 | Discharge: 2020-02-17 | Disposition: A | Discharge status: Home / Self Care | Payer: PPO | Source: Ambulatory Visit | Attending: Cardiovascular Disease | Admitting: Cardiovascular Disease

## 2020-02-17 ENCOUNTER — Encounter (HOSPITAL_COMMUNITY): Payer: Self-pay

## 2020-02-17 VITALS — BP 110/80 | HR 91 | Ht 66.0 in | Wt 151.9 lb

## 2020-02-17 DIAGNOSIS — Z951 Presence of aortocoronary bypass graft: Secondary | ICD-10-CM

## 2020-02-17 DIAGNOSIS — I110 Hypertensive heart disease with heart failure: Secondary | ICD-10-CM | POA: Diagnosis not present

## 2020-02-17 DIAGNOSIS — I480 Paroxysmal atrial fibrillation: Secondary | ICD-10-CM | POA: Diagnosis not present

## 2020-02-17 DIAGNOSIS — Z79899 Other long term (current) drug therapy: Secondary | ICD-10-CM | POA: Diagnosis not present

## 2020-02-17 DIAGNOSIS — I5022 Chronic systolic (congestive) heart failure: Secondary | ICD-10-CM | POA: Diagnosis not present

## 2020-02-17 DIAGNOSIS — E785 Hyperlipidemia, unspecified: Secondary | ICD-10-CM | POA: Diagnosis not present

## 2020-02-17 LAB — GLUCOSE, CAPILLARY: Glucose-Capillary: 160 mg/dL — ABNORMAL HIGH (ref 70–99)

## 2020-02-17 NOTE — Progress Notes (Addendum)
Cardiac Individual Treatment Plan  Patient Details  Name: James Braun MRN: [id number] Date of Birth: 08/27/1940 Referring Provider:     CARDIAC REHAB PHASE II ORIENTATION from 02/17/2020 in [place]  Referring Provider Quay Burow, MD      Initial Encounter Date:    CARDIAC REHAB PHASE II ORIENTATION from 02/17/2020 in [place]  Date 02/17/20      Visit Diagnosis: 11/28/19 CABG x3  Patient's Home Medications on Admission:  Current Outpatient Medications:  .  alum hydroxide-mag trisilicate (GAVISCON) 40-98 MG CHEW chewable tablet, Chew 2 tablets by mouth 3 (three) times daily as needed for indigestion or heartburn., Disp: , Rfl:  .  aspirin EC 81 MG EC tablet, Take 1 tablet (81 mg total) by mouth daily., Disp: , Rfl:  .  Cholecalciferol (VITAMIN D3) 250 MCG (10000 UT) TABS, Take 1 tablet by mouth 2 (two) times daily., Disp: , Rfl:  .  Cyanocobalamin (VITAMIN B-12) 1000 MCG SUBL, Place under the tongue., Disp: , Rfl:  .  finasteride (PROSCAR) 5 MG tablet, Take 1 tablet (5 mg total) by mouth daily., Disp: 90 tablet, Rfl: 3 .  metFORMIN (GLUCOPHAGE-XR) 500 MG 24 hr tablet, Take 1 tablet (500 mg total) by mouth 2 (two) times daily., Disp: 180 tablet, Rfl: 3 .  Multiple Vitamin (MULTIVITAMIN) tablet, Take 1 tablet by mouth daily., Disp: , Rfl:  .  omeprazole (PRILOSEC) 20 MG capsule, Take 20 mg by mouth as needed., Disp: , Rfl:  .  Potassium Citrate 15 MEQ (1620 MG) TBCR, Take 1 tablet by mouth in the morning and at bedtime., Disp: , Rfl:  .  rosuvastatin (CRESTOR) 10 MG tablet, Take 1 tablet (10 mg total) by mouth daily., Disp: 30 tablet, Rfl: 11 .  sacubitril-valsartan (ENTRESTO) 24-26 MG, Take 1 tablet by mouth 2 (two) times daily., Disp: 60 tablet, Rfl: 3 .  spironolactone (ALDACTONE) 25 MG tablet, Take 1 tablet (25 mg total) by mouth daily., Disp: 30 tablet, Rfl: 3 .  timolol (TIMOPTIC) 0.5 % ophthalmic solution,  Place 1 drop into both eyes 2 (two) times daily. , Disp: , Rfl:   Past Medical History: Past Medical History:  Diagnosis Date  . BPH (benign prostatic hypertrophy)   . Coronary artery disease   . History of kidney stones   . History of melanoma excision OCT 2010  . Hyperlipidemia   . Hypertension   . Left ureteral calculus   . Nephrolithiasis    bilateral  . Type 2 diabetes mellitus ([place])     Tobacco Use: Social History   Tobacco Use  Smoking Status Never Smoker  Smokeless Tobacco Never Used    Labs: Recent Review Flowsheet Data    Labs for ITP Cardiac and Pulmonary Rehab Latest Ref Rng & Units 12/05/2019 12/06/2019 12/07/2019 12/08/2019 01/16/2020   Cholestrol 0 - 200 mg/dL - - - - 188   LDLCALC 0 - 99 mg/dL - - - - 125(H)   LDLDIRECT mg/dL - - - - -   HDL >39.00 mg/dL - - - - 33.20(L)   Trlycerides 0 - 149 mg/dL - - - - 146.0   Hemoglobin A1c 4.8 - 5.6 % - - - - -   PHART 7.35 - 7.45 - - - - -   PCO2ART 32 - 48 mmHg - - - - -   HCO3 20.0 - 28.0 mmol/L - - - - -   TCO2 22 - 32 mmol/L - - - - -  ACIDBASEDEF 0.0 - 2.0 mmol/L - - - - -   O2SAT % 57.8 53.6 56.0 45.6 -      Capillary Blood Glucose: Lab Results  Component Value Date   GLUCAP 160 (H) 02/17/2020   GLUCAP 128 (H) 12/08/2019   GLUCAP 107 (H) 12/08/2019   GLUCAP 128 (H) 12/07/2019   GLUCAP 141 (H) 12/07/2019     Exercise Target Goals: Exercise Program Goal: Individual exercise prescription set using results from initial 6 min walk test and THRR while considering  patient's activity barriers and safety.   Exercise Prescription Goal: Starting with aerobic activity 30 plus minutes a day, 3 days per week for initial exercise prescription. Provide home exercise prescription and guidelines that participant acknowledges understanding prior to discharge.  Activity Barriers & Risk Stratification:  Activity Barriers & Cardiac Risk Stratification - 02/17/20 1429      Activity Barriers & Cardiac Risk  Stratification   Activity Barriers History of Falls;Other (comment)    Comments Dizziness    Cardiac Risk Stratification High           6 Minute Walk:  6 Minute Walk    Row Name 02/17/20 1456         6 Minute Walk   Phase Initial     Distance 1466 feet     Walk Time 6 minutes     # of Rest Breaks 0     MPH 2.78     METS 2.88     RPE 11     Perceived Dyspnea  0     VO2 Peak 10.08     Symptoms No     Resting HR 91 bpm     Resting BP 110/80     Resting Oxygen Saturation  97 %     Exercise Oxygen Saturation  during 6 min walk 98 %     Max Ex. HR 104 bpm     Max Ex. BP 130/72     2 Minute Post BP 122/80            Oxygen Initial Assessment:   Oxygen Re-Evaluation:   Oxygen Discharge (Final Oxygen Re-Evaluation):   Initial Exercise Prescription:  Initial Exercise Prescription - 02/17/20 1500      Date of Initial Exercise RX and Referring Provider   Date 02/17/20    Referring Provider Quay Burow, MD    Expected Discharge Date 04/16/20      NuStep   Level 2    SPM 85    Minutes 15    METs 2.5      Track   Laps 16    Minutes 15    METs 2.86      Prescription Details   Frequency (times per week) 3    Duration Progress to 30 minutes of continuous aerobic without signs/symptoms of physical distress      Intensity   THRR 40-80% of Max Heartrate 56-113    Ratings of Perceived Exertion 11-13    Perceived Dyspnea 0-4      Progression   Progression Continue to progress workloads to maintain intensity without signs/symptoms of physical distress.      Resistance Training   Training Prescription Yes    Weight 3lbs    Reps 10-15           Perform Capillary Blood Glucose checks as needed.  Exercise Prescription Changes:   Exercise Comments:   Exercise Goals and Review:   Exercise Goals    Row Name  02/17/20 1421             Exercise Goals   Increase Physical Activity Yes       Intervention Provide advice, education, support and  counseling about physical activity/exercise needs.;Develop an individualized exercise prescription for aerobic and resistive training based on initial evaluation findings, risk stratification, comorbidities and participant's personal goals.       Expected Outcomes Short Term: Attend rehab on a regular basis to increase amount of physical activity.;Long Term: Exercising regularly at least 3-5 days a week.;Long Term: Add in home exercise to make exercise part of routine and to increase amount of physical activity.       Increase Strength and Stamina Yes       Intervention Provide advice, education, support and counseling about physical activity/exercise needs.;Develop an individualized exercise prescription for aerobic and resistive training based on initial evaluation findings, risk stratification, comorbidities and participant's personal goals.       Expected Outcomes Short Term: Increase workloads from initial exercise prescription for resistance, speed, and METs.;Short Term: Perform resistance training exercises routinely during rehab and add in resistance training at home;Long Term: Improve cardiorespiratory fitness, muscular endurance and strength as measured by increased METs and functional capacity (6MWT)       Able to understand and use rate of perceived exertion (RPE) scale Yes       Intervention Provide education and explanation on how to use RPE scale       Expected Outcomes Short Term: Able to use RPE daily in rehab to express subjective intensity level;Long Term:  Able to use RPE to guide intensity level when exercising independently       Knowledge and understanding of Target Heart Rate Range (THRR) Yes       Intervention Provide education and explanation of THRR including how the numbers were predicted and where they are located for reference       Expected Outcomes Short Term: Able to state/look up THRR;Long Term: Able to use THRR to govern intensity when exercising independently;Short Term:  Able to use daily as guideline for intensity in rehab       Able to check pulse independently Yes       Intervention Provide education and demonstration on how to check pulse in carotid and radial arteries.;Review the importance of being able to check your own pulse for safety during independent exercise       Expected Outcomes Short Term: Able to explain why pulse checking is important during independent exercise;Long Term: Able to check pulse independently and accurately       Understanding of Exercise Prescription Yes       Intervention Provide education, explanation, and written materials on patient's individual exercise prescription       Expected Outcomes Short Term: Able to explain program exercise prescription;Long Term: Able to explain home exercise prescription to exercise independently              Exercise Goals Re-Evaluation :    Discharge Exercise Prescription (Final Exercise Prescription Changes):   Nutrition:  Target Goals: Understanding of nutrition guidelines, daily intake of sodium 1500mg , cholesterol 200mg , calories 30% from fat and 7% or less from saturated fats, daily to have 5 or more servings of fruits and vegetables.  Biometrics:  Pre Biometrics - 02/17/20 1429      Pre Biometrics   Waist Circumference 38.75 inches    Hip Circumference 37 inches    Waist to Hip Ratio 1.05 %  Triceps Skinfold 6 mm    % Body Fat 22.8 %    Grip Strength 32.5 kg    Flexibility 10.75 in    Single Leg Stand 22.25 seconds            Nutrition Therapy Plan and Nutrition Goals:   Nutrition Assessments:   Nutrition Goals Re-Evaluation:   Nutrition Goals Discharge (Final Nutrition Goals Re-Evaluation):   Psychosocial: Target Goals: Acknowledge presence or absence of significant depression and/or stress, maximize coping skills, provide positive support system. Participant is able to verbalize types and ability to use techniques and skills needed for reducing  stress and depression.  Initial Review & Psychosocial Screening:  Initial Psych Review & Screening - 02/17/20 1636      Initial Review   Current issues with Current Stress Concerns    Source of Stress Concerns Chronic Illness    Comments James Braun says he is concerned about his health as he has had some dizziness after surgery      Family Dynamics   Good Support System? Yes   James Braun has his wife for support     Barriers   Psychosocial barriers to participate in program There are no identifiable barriers or psychosocial needs.      Screening Interventions   Interventions Encouraged to exercise    Expected Outcomes Long Term Goal: Stressors or current issues are controlled or eliminated.           Quality of Life Scores:  Quality of Life - 02/17/20 1557      Quality of Life   Select Quality of Life      Quality of Life Scores   Health/Function Pre 25.2 %    Socioeconomic Pre 28.29 %    Psych/Spiritual Pre 28.29 %    Family Pre 27.6 %    GLOBAL Pre 26.82 %          Scores of 19 and below usually indicate a poorer quality of life in these areas.  A difference of  2-3 points is a clinically meaningful difference.  A difference of 2-3 points in the total score of the Quality of Life Index has been associated with significant improvement in overall quality of life, self-image, physical symptoms, and general health in studies assessing change in quality of life.  PHQ-9: Recent Review Flowsheet Data    Depression screen [facility] 2/9 02/17/2020 03/12/2019 03/06/2018 03/01/2017 02/19/2015   Decreased Interest 0 0 0 0 0   Down, Depressed, Hopeless 0 0 0 0 0   PHQ - 2 Score 0 0 0 0 0     Interpretation of Total Score  Total Score Depression Severity:  1-4 = Minimal depression, 5-9 = Mild depression, 10-14 = Moderate depression, 15-19 = Moderately severe depression, 20-27 = Severe depression   Psychosocial Evaluation and Intervention:   Psychosocial Re-Evaluation:   Psychosocial Discharge  (Final Psychosocial Re-Evaluation):   Vocational Rehabilitation: Provide vocational rehab assistance to qualifying candidates.   Vocational Rehab Evaluation & Intervention:  Vocational Rehab - 02/17/20 1639      Initial Vocational Rehab Evaluation & Intervention   Assessment shows need for Vocational Rehabilitation No   James Braun is retired and does not need vocational rehab at this time          Education: Education Goals: Education classes will be provided on a weekly basis, covering required topics. Participant will state understanding/return demonstration of topics presented.  Learning Barriers/Preferences:  Learning Barriers/Preferences - 02/17/20 1638      Learning  Barriers/Preferences   Learning Barriers Sight   wears reading glasses   Learning Preferences None           Education Topics: Hypertension, Hypertension Reduction -Define heart disease and high blood pressure. Discus how high blood pressure affects the body and ways to reduce high blood pressure.   Exercise and Your Heart -Discuss why it is important to exercise, the FITT principles of exercise, normal and abnormal responses to exercise, and how to exercise safely.   Angina -Discuss definition of angina, causes of angina, treatment of angina, and how to decrease risk of having angina.   Cardiac Medications -Review what the following cardiac medications are used for, how they affect the body, and side effects that may occur when taking the medications.  Medications include Aspirin, Beta blockers, calcium channel blockers, ACE Inhibitors, angiotensin receptor blockers, diuretics, digoxin, and antihyperlipidemics.   Congestive Heart Failure -Discuss the definition of CHF, how to live with CHF, the signs and symptoms of CHF, and how keep track of weight and sodium intake.   Heart Disease and Intimacy -Discus the effect sexual activity has on the heart, how changes occur during intimacy as we age, and safety  during sexual activity.   Smoking Cessation / COPD -Discuss different methods to quit smoking, the health benefits of quitting smoking, and the definition of COPD.   Nutrition I: Fats -Discuss the types of cholesterol, what cholesterol does to the heart, and how cholesterol levels can be controlled.   Nutrition II: Labels -Discuss the different components of food labels and how to read food label   Heart Parts/Heart Disease and PAD -Discuss the anatomy of the heart, the pathway of blood circulation through the heart, and these are affected by heart disease.   Stress I: Signs and Symptoms -Discuss the causes of stress, how stress may lead to anxiety and depression, and ways to limit stress.   Stress II: Relaxation -Discuss different types of relaxation techniques to limit stress.   Warning Signs of Stroke / TIA -Discuss definition of a stroke, what the signs and symptoms are of a stroke, and how to identify when someone is having stroke.   Knowledge Questionnaire Score:  Knowledge Questionnaire Score - 02/17/20 1558      Knowledge Questionnaire Score   Pre Score 21/24           Core Components/Risk Factors/Patient Goals at Admission:  Personal Goals and Risk Factors at Admission - 02/17/20 1640      Core Components/Risk Factors/Patient Goals on Admission    Weight Management Yes;Weight Maintenance    Intervention Weight Management: Develop a combined nutrition and exercise program designed to reach desired caloric intake, while maintaining appropriate intake of nutrient and fiber, sodium and fats, and appropriate energy expenditure required for the weight goal.;Weight Management: Provide education and appropriate resources to help participant work on and attain dietary goals.;Weight Management/Obesity: Establish reasonable short term and long term weight goals.    Expected Outcomes Short Term: Continue to assess and modify interventions until short term weight is  achieved;Long Term: Adherence to nutrition and physical activity/exercise program aimed toward attainment of established weight goal;Weight Maintenance: Understanding of the daily nutrition guidelines, which includes 25-35% calories from fat, 7% or less cal from saturated fats, less than 200mg  cholesterol, less than 1.5gm of sodium, & 5 or more servings of fruits and vegetables daily;Understanding of distribution of calorie intake throughout the day with the consumption of 4-5 meals/snacks;Understanding recommendations for meals to include 15-35% energy as  protein, 25-35% energy from fat, 35-60% energy from carbohydrates, less than 200mg  of dietary cholesterol, 20-35 gm of total fiber daily    Diabetes Yes    Intervention Provide education about signs/symptoms and action to take for hypo/hyperglycemia.;Provide education about proper nutrition, including hydration, and aerobic/resistive exercise prescription along with prescribed medications to achieve blood glucose in normal ranges: Fasting glucose 65-99 mg/dL    Expected Outcomes Short Term: Participant verbalizes understanding of the signs/symptoms and immediate care of hyper/hypoglycemia, proper foot care and importance of medication, aerobic/resistive exercise and nutrition plan for blood glucose control.;Long Term: Attainment of HbA1C < 7%.    Heart Failure Yes    Intervention Provide a combined exercise and nutrition program that is supplemented with education, support and counseling about heart failure. Directed toward relieving symptoms such as shortness of breath, decreased exercise tolerance, and extremity edema.    Expected Outcomes Improve functional capacity of life;Short term: Attendance in program 2-3 days a week with increased exercise capacity. Reported lower sodium intake. Reported increased fruit and vegetable intake. Reports medication compliance.;Short term: Daily weights obtained and reported for increase. Utilizing diuretic protocols set  by physician.;Long term: Adoption of self-care skills and reduction of barriers for early signs and symptoms recognition and intervention leading to self-care maintenance.    Hypertension Yes    Intervention Provide education on lifestyle modifcations including regular physical activity/exercise, weight management, moderate sodium restriction and increased consumption of fresh fruit, vegetables, and low fat dairy, alcohol moderation, and smoking cessation.;Monitor prescription use compliance.    Expected Outcomes Short Term: Continued assessment and intervention until BP is < 140/22mm HG in hypertensive participants. < 130/46mm HG in hypertensive participants with diabetes, heart failure or chronic kidney disease.;Long Term: Maintenance of blood pressure at goal levels.    Lipids Yes    Intervention Provide education and support for participant on nutrition & aerobic/resistive exercise along with prescribed medications to achieve LDL 70mg , HDL >40mg .    Expected Outcomes Short Term: Participant states understanding of desired cholesterol values and is compliant with medications prescribed. Participant is following exercise prescription and nutrition guidelines.;Long Term: Cholesterol controlled with medications as prescribed, with individualized exercise RX and with personalized nutrition plan. Value goals: LDL < 70mg , HDL > 40 mg.    Stress Yes    Intervention Offer individual and/or small group education and counseling on adjustment to heart disease, stress management and health-related lifestyle change. Teach and support self-help strategies.    Expected Outcomes Short Term: Participant demonstrates changes in health-related behavior, relaxation and other stress management skills, ability to obtain effective social support, and compliance with psychotropic medications if prescribed.;Long Term: Emotional wellbeing is indicated by absence of clinically significant psychosocial distress or social  isolation.           Core Components/Risk Factors/Patient Goals Review:    Core Components/Risk Factors/Patient Goals at Discharge (Final Review):    ITP Comments:  ITP Comments    Row Name 02/17/20 1416           ITP Comments Medical Director- Dr. Fransico Him, MD              Comments: James Braun attended orientation on 02/17/2020 to review rules and guidelines for program.  Completed 6 minute walk test, Intitial ITP, and exercise prescription.  VSS. Telemetry-Sinus Rhythm.  Asymptomatic.BP immediately post exercise 98/62 sitting. Standing blood pressure 120/80. Patient reports that he sometimes feels dizzy at times but denied feeling lightheaded today. Will continue to monitor the patient throughout  the program.  Safety measures and social distancing in place per CDC guidelines.Barnet Pall, RN,BSN 02/17/2020 4:48 PM

## 2020-02-23 ENCOUNTER — Encounter (HOSPITAL_COMMUNITY)
Admission: RE | Admit: 2020-02-23 | Discharge: 2020-02-23 | Disposition: A | Discharge status: Home / Self Care | Payer: PPO | Source: Ambulatory Visit | Attending: Cardiovascular Disease | Admitting: Cardiovascular Disease

## 2020-02-23 ENCOUNTER — Other Ambulatory Visit: Payer: Self-pay

## 2020-02-23 DIAGNOSIS — Z951 Presence of aortocoronary bypass graft: Secondary | ICD-10-CM | POA: Diagnosis not present

## 2020-02-23 LAB — GLUCOSE, CAPILLARY
Glucose-Capillary: 194 mg/dL — ABNORMAL HIGH (ref 70–99)
Glucose-Capillary: 133 mg/dL — ABNORMAL HIGH (ref 70–99)

## 2020-02-23 NOTE — Progress Notes (Signed)
Daily Session Note  Patient Details  Name: James Braun MRN: [id number] Date of Birth: Aug 11, 1940 Referring Provider:     CARDIAC REHAB PHASE II ORIENTATION from 02/17/2020 in [place]  Referring Provider Quay Burow, MD      Encounter Date: 02/23/2020  Check In:  Session Check In - 02/23/20 1117      Check-In   Supervising physician immediately available to respond to emergencies [facility] immediately available    Physician(s) Dr. Kurtis Bushman    Location [facility]    Staff Present Barnet Pall, RN, Deland Pretty, MS, ACSM CEP, Exercise Physiologist;Portia Rollene Rotunda, RN, Mosie Epstein, MS,ACSM CEP, Exercise Physiologist;David Makemson, MS, EP-C, CCRP    Virtual Visit No    Medication changes reported     No    Fall or balance concerns reported    No    Tobacco Cessation No Change    Warm-up and Cool-down Performed on first and last piece of equipment    Resistance Training Performed Yes    VAD Patient? No    PAD/SET Patient? No      Pain Assessment   Currently in Pain? No/denies    Pain Score 0-No pain    Multiple Pain Sites No           Capillary Blood Glucose: Results for orders placed or performed during the hospital encounter of 02/23/20 (from the past 24 hour(s))  Glucose, capillary     Status: Abnormal   Collection Time: 02/23/20 11:08 AM  Result Value Ref Range   Glucose-Capillary 194 (H) 70 - 99 mg/dL  Glucose, capillary     Status: Abnormal   Collection Time: 02/23/20 11:56 AM  Result Value Ref Range   Glucose-Capillary 133 (H) 70 - 99 mg/dL     Exercise Prescription Changes - 02/23/20 1200      Response to Exercise   Blood Pressure (Admit) 112/64    Blood Pressure (Exercise) 124/70    Blood Pressure (Exit) 114/68    Heart Rate (Admit) 84 bpm    Heart Rate (Exercise) 103 bpm    Heart Rate (Exit) 82 bpm    Rating of Perceived Exertion (Exercise) 13    Perceived Dyspnea (Exercise) 0     Symptoms None    Comments Pt's first day of exercise    Duration Progress to 30 minutes of  aerobic without signs/symptoms of physical distress    Intensity THRR unchanged      Progression   Progression Continue to progress workloads to maintain intensity without signs/symptoms of physical distress.    Average METs 2.6      Resistance Training   Training Prescription Yes    Weight 3lbs    Reps 10-15    Time 10 Minutes      Interval Training   Interval Training No      NuStep   Level 2    SPM 80    Minutes 15    METs 2.3      Track   Laps 15    Minutes 15    METs 2.6           Social History   Tobacco Use  Smoking Status Never Smoker  Smokeless Tobacco Never Used    Goals Met:  Exercise tolerated well Personal goals reviewed No report of cardiac concerns or symptoms  Goals Unmet:  Not Applicable  Comments: [name] started cardiac rehab today.  Pt tolerated light exercise without difficulty.  VSS and CBG's are stable., telemetry-Sinus Rhythm, asymptomatic.  Medication list reconciled. Pt denies barriers to medicaiton compliance.  PSYCHOSOCIAL ASSESSMENT:  PHQ-0. Pt exhibits positive coping skills, hopeful outlook with supportive family. No psychosocial needs identified at this time, no psychosocial interventions necessary.    Pt enjoys writing.   Pt oriented to exercise equipment and routine.    Understanding verbalized.Barnet Pall, RN,BSN 02/23/2020 2:50 PM  Dr. Fransico Him is Medical Director for Cardiac Rehab at [facility].

## 2020-02-25 ENCOUNTER — Other Ambulatory Visit: Payer: Self-pay

## 2020-02-25 ENCOUNTER — Ambulatory Visit
Admission: RE | Admit: 2020-02-25 | Discharge: 2020-02-25 | Disposition: A | Discharge status: Home / Self Care | Payer: PPO | Source: Ambulatory Visit | Attending: Cardiothoracic Surgery | Admitting: Cardiothoracic Surgery

## 2020-02-25 ENCOUNTER — Ambulatory Visit (INDEPENDENT_AMBULATORY_CARE_PROVIDER_SITE_OTHER): Payer: Self-pay | Admitting: Cardiothoracic Surgery

## 2020-02-25 ENCOUNTER — Other Ambulatory Visit: Payer: Self-pay | Admitting: *Deleted

## 2020-02-25 ENCOUNTER — Encounter: Payer: Self-pay | Admitting: Cardiothoracic Surgery

## 2020-02-25 VITALS — BP 123/79 | HR 84 | Temp 97.8°F | Resp 20 | Ht 66.0 in | Wt 152.0 lb

## 2020-02-25 DIAGNOSIS — Z09 Encounter for follow-up examination after completed treatment for conditions other than malignant neoplasm: Secondary | ICD-10-CM

## 2020-02-25 DIAGNOSIS — R079 Chest pain, unspecified: Secondary | ICD-10-CM | POA: Diagnosis not present

## 2020-02-25 DIAGNOSIS — Z9889 Other specified postprocedural states: Secondary | ICD-10-CM | POA: Diagnosis not present

## 2020-02-25 DIAGNOSIS — I2510 Atherosclerotic heart disease of native coronary artery without angina pectoris: Secondary | ICD-10-CM

## 2020-02-25 DIAGNOSIS — Z951 Presence of aortocoronary bypass graft: Secondary | ICD-10-CM

## 2020-02-25 DIAGNOSIS — S2232XA Fracture of one rib, left side, initial encounter for closed fracture: Secondary | ICD-10-CM

## 2020-02-25 NOTE — Progress Notes (Signed)
PCP is [name], MD Referring Provider is [name], MD  Chief Complaint  Patient presents with  . Routine Post Op    8 week f/u HX of CABG    HPI: Patient returns for final 67-month follow-up after multivessel CABG for ischemic cardiomyopathy and CAD.  He is just now starting outpatient cardiac rehab due to a backlog in patients.  He has had no symptoms of angina or CHF.  Recently the patient did trip over a hose in his driveway and fell on pavement.  His head CT scan was unremarkable.  Today we will check a chest x-ray because of tender left-sided ribs.     Past Medical History:  Diagnosis Date  . BPH (benign prostatic hypertrophy)   . Coronary artery disease   . History of kidney stones   . History of melanoma excision OCT 2010  . Hyperlipidemia   . Hypertension   . Left ureteral calculus   . Nephrolithiasis    bilateral  . Type 2 diabetes mellitus ([place])     Past Surgical History:  Procedure Laterality Date  . CARDIAC CATHETERIZATION    . CHOLECYSTECTOMY  july 2000  . CORONARY ARTERY BYPASS GRAFT N/A 11/28/2019   Procedure: CORONARY ARTERY BYPASS GRAFTING (CABG) x3, using left internal mammary artery and Saphenous vein harvested endoscopically;  Surgeon: [name], MD;  Location: [address];  Service: Open Heart Surgery;  Laterality: N/A;  . CYSTOSCOPY WITH STENT PLACEMENT Left 11/14/2012   Procedure: CYSTOSCOPY WITH STENT PLACEMENT;  Surgeon: [name], MD;  Location: [facility];  Service: Urology;  Laterality: Left;  . CYSTOSCOPY/RETROGRADE/URETEROSCOPY/STONE EXTRACTION WITH BASKET Left 11/14/2012   Procedure: CYSTOSCOPY/RETROGRADE/URETEROSCOPY/STONE EXTRACTION WITH BASKET;  Surgeon: [name], MD;  Location: [facility];  Service: Urology;  Laterality: Left;  . EXTRACORPOREAL SHOCK WAVE LITHOTRIPSY  JULY 2000   X2  . HOLMIUM LASER APPLICATION Left 12/16/9507   Procedure: HOLMIUM LASER APPLICATION;  Surgeon:  [name], MD;  Location: [facility];  Service: Urology;  Laterality: Left;  . LEFT HEART CATH AND CORONARY ANGIOGRAPHY N/A 11/27/2019   Procedure: LEFT HEART CATH AND CORONARY ANGIOGRAPHY;  Surgeon: [name], MD;  Location: [place];  Service: Cardiovascular;  Laterality: N/A;  . LEFT URETEROSCOPIC STONE EXTRACTION  02-22-2007  . NEPHROLITHOTOMY Right 09-28-2010   percutaneous  . ORIF CLAVICLE FRACTURE  2006  . PROSTATE BIOPSY  1998   neg  . SHOULDER ARTHROSCOPY  1/15   Dr [name]  . TEE WITHOUT CARDIOVERSION N/A 11/28/2019   Procedure: TRANSESOPHAGEAL ECHOCARDIOGRAM (TEE);  Surgeon: [name], Collier Salina, MD;  Location: [place];  Service: Open Heart Surgery;  Laterality: N/A;  . TONSILLECTOMY      Family History  Problem Relation Age of Onset  . Hypertension Mother   . Diabetes Father   . Hypertension Father   . Coronary artery disease Neg Hx   . Cancer Neg Hx     Social History Social History   Tobacco Use  . Smoking status: Never Smoker  . Smokeless tobacco: Never Used  Substance Use Topics  . Alcohol use: No  . Drug use: No    Current Outpatient Medications  Medication Sig Dispense Refill  . alum hydroxide-mag trisilicate (GAVISCON) 32-67 MG CHEW chewable tablet Chew 2 tablets by mouth 3 (three) times daily as needed for indigestion or heartburn.    Marland Kitchen aspirin EC 81 MG EC tablet Take 1 tablet (81 mg total) by  mouth daily.    . Cholecalciferol (VITAMIN D3) 250 MCG (10000 UT) TABS Take 1 tablet by mouth 2 (two) times daily.    . Cyanocobalamin (VITAMIN B-12) 1000 MCG SUBL Place under the tongue.    . finasteride (PROSCAR) 5 MG tablet Take 1 tablet (5 mg total) by mouth daily. 90 tablet 3  . metFORMIN (GLUCOPHAGE-XR) 500 MG 24 hr tablet Take 1 tablet (500 mg total) by mouth 2 (two) times daily. 180 tablet 3  . Multiple Vitamin (MULTIVITAMIN) tablet Take 1 tablet by mouth daily.    Marland Kitchen omeprazole (PRILOSEC) 20 MG capsule Take 20 mg by mouth  as needed.    . Potassium Citrate 15 MEQ (1620 MG) TBCR Take 1 tablet by mouth in the morning and at bedtime.    . rosuvastatin (CRESTOR) 10 MG tablet Take 1 tablet (10 mg total) by mouth daily. 30 tablet 11  . sacubitril-valsartan (ENTRESTO) 24-26 MG Take 1 tablet by mouth 2 (two) times daily. 60 tablet 3  . spironolactone (ALDACTONE) 25 MG tablet Take 1 tablet (25 mg total) by mouth daily. 30 tablet 3  . timolol (TIMOPTIC) 0.5 % ophthalmic solution Place 1 drop into both eyes 2 (two) times daily.      No current facility-administered medications for this visit.    Allergies  Allergen Reactions  . Atorvastatin Other (See Comments)    myalgias  . Morphine And Related Other (See Comments)     hypotension  . Levofloxacin Rash  . Septra [Sulfamethoxazole-Trimethoprim] Rash    Review of Systems   The patient notes some difficulty with short-term memory and a "fuzzy head". He was reassured that these are typical changes after heart surgery and by 6 months postop those issues should be resolved. BP 123/79   Pulse 84   Temp 97.8 F (36.6 C) (Skin)   Resp 20   Ht 5\' 6"  (1.676 m)   Wt 152 lb (68.9 kg)   SpO2 96% Comment: RA  BMI 24.53 kg/m  Physical Exam      Exam    General- alert and comfortable    Neck- no JVD, no cervical adenopathy palpable, no carotid bruit   Lungs- clear without rales, wheezes   Cor- regular rate and rhythm, no murmur , gallop   Abdomen- soft, non-tender   Extremities - warm, non-tender, minimal edema   Neuro- oriented, appropriate, no focal weakness   Diagnostic Tests: Chest x-ray pending to evaluate left rib fractures from his recent fall  Impression: Doing extremely well now almost 3 months postop CABG for ischemic cardiomyopathy.  He was feeling extremely well prior to his recent fall and now is somewhat sore and has slowed down.  He is encouraged to follow a heart healthy lifestyle including 20 to 30 minutes of walking daily and a heart healthy  diet.  He is encouraged to remain on his aspirin and Crestor to optimize his bypass graft patency long-term.  Plan: He will return here as needed.  Len Childs, MD [facility] [phone]

## 2020-02-26 NOTE — Progress Notes (Signed)
Cardiac Individual Treatment Plan  Patient Details  Name: James Braun MRN: [id number] Date of Birth: 08/27/1940 Referring Provider:     CARDIAC REHAB PHASE II ORIENTATION from 02/17/2020 in [place]  Referring Provider Quay Burow, MD      Initial Encounter Date:    CARDIAC REHAB PHASE II ORIENTATION from 02/17/2020 in [place]  Date 02/17/20      Visit Diagnosis: 11/28/19 CABG x3  Patient's Home Medications on Admission:  Current Outpatient Medications:  .  alum hydroxide-mag trisilicate (GAVISCON) 40-98 MG CHEW chewable tablet, Chew 2 tablets by mouth 3 (three) times daily as needed for indigestion or heartburn., Disp: , Rfl:  .  aspirin EC 81 MG EC tablet, Take 1 tablet (81 mg total) by mouth daily., Disp: , Rfl:  .  Cholecalciferol (VITAMIN D3) 250 MCG (10000 UT) TABS, Take 1 tablet by mouth 2 (two) times daily., Disp: , Rfl:  .  Cyanocobalamin (VITAMIN B-12) 1000 MCG SUBL, Place under the tongue., Disp: , Rfl:  .  finasteride (PROSCAR) 5 MG tablet, Take 1 tablet (5 mg total) by mouth daily., Disp: 90 tablet, Rfl: 3 .  metFORMIN (GLUCOPHAGE-XR) 500 MG 24 hr tablet, Take 1 tablet (500 mg total) by mouth 2 (two) times daily., Disp: 180 tablet, Rfl: 3 .  Multiple Vitamin (MULTIVITAMIN) tablet, Take 1 tablet by mouth daily., Disp: , Rfl:  .  omeprazole (PRILOSEC) 20 MG capsule, Take 20 mg by mouth as needed., Disp: , Rfl:  .  Potassium Citrate 15 MEQ (1620 MG) TBCR, Take 1 tablet by mouth in the morning and at bedtime., Disp: , Rfl:  .  rosuvastatin (CRESTOR) 10 MG tablet, Take 1 tablet (10 mg total) by mouth daily., Disp: 30 tablet, Rfl: 11 .  sacubitril-valsartan (ENTRESTO) 24-26 MG, Take 1 tablet by mouth 2 (two) times daily., Disp: 60 tablet, Rfl: 3 .  spironolactone (ALDACTONE) 25 MG tablet, Take 1 tablet (25 mg total) by mouth daily., Disp: 30 tablet, Rfl: 3 .  timolol (TIMOPTIC) 0.5 % ophthalmic solution,  Place 1 drop into both eyes 2 (two) times daily. , Disp: , Rfl:   Past Medical History: Past Medical History:  Diagnosis Date  . BPH (benign prostatic hypertrophy)   . Coronary artery disease   . History of kidney stones   . History of melanoma excision OCT 2010  . Hyperlipidemia   . Hypertension   . Left ureteral calculus   . Nephrolithiasis    bilateral  . Type 2 diabetes mellitus ([place])     Tobacco Use: Social History   Tobacco Use  Smoking Status Never Smoker  Smokeless Tobacco Never Used    Labs: Recent Review Flowsheet Data    Labs for ITP Cardiac and Pulmonary Rehab Latest Ref Rng & Units 12/05/2019 12/06/2019 12/07/2019 12/08/2019 01/16/2020   Cholestrol 0 - 200 mg/dL - - - - 188   LDLCALC 0 - 99 mg/dL - - - - 125(H)   LDLDIRECT mg/dL - - - - -   HDL >39.00 mg/dL - - - - 33.20(L)   Trlycerides 0 - 149 mg/dL - - - - 146.0   Hemoglobin A1c 4.8 - 5.6 % - - - - -   PHART 7.35 - 7.45 - - - - -   PCO2ART 32 - 48 mmHg - - - - -   HCO3 20.0 - 28.0 mmol/L - - - - -   TCO2 22 - 32 mmol/L - - - - -  ACIDBASEDEF 0.0 - 2.0 mmol/L - - - - -   O2SAT % 57.8 53.6 56.0 45.6 -      Capillary Blood Glucose: Lab Results  Component Value Date   GLUCAP 133 (H) 02/23/2020   GLUCAP 194 (H) 02/23/2020   GLUCAP 160 (H) 02/17/2020   GLUCAP 128 (H) 12/08/2019   GLUCAP 107 (H) 12/08/2019     Exercise Target Goals: Exercise Program Goal: Individual exercise prescription set using results from initial 6 min walk test and THRR while considering  patient's activity barriers and safety.   Exercise Prescription Goal: Starting with aerobic activity 30 plus minutes a day, 3 days per week for initial exercise prescription. Provide home exercise prescription and guidelines that participant acknowledges understanding prior to discharge.  Activity Barriers & Risk Stratification:  Activity Barriers & Cardiac Risk Stratification - 02/17/20 1429      Activity Barriers & Cardiac Risk  Stratification   Activity Barriers History of Falls;Other (comment)    Comments Dizziness    Cardiac Risk Stratification High           6 Minute Walk:  6 Minute Walk    Row Name 02/17/20 1456         6 Minute Walk   Phase Initial     Distance 1466 feet     Walk Time 6 minutes     # of Rest Breaks 0     MPH 2.78     METS 2.88     RPE 11     Perceived Dyspnea  0     VO2 Peak 10.08     Symptoms No     Resting HR 91 bpm     Resting BP 110/80     Resting Oxygen Saturation  97 %     Exercise Oxygen Saturation  during 6 min walk 98 %     Max Ex. HR 104 bpm     Max Ex. BP 130/72     2 Minute Post BP 122/80            Oxygen Initial Assessment:   Oxygen Re-Evaluation:   Oxygen Discharge (Final Oxygen Re-Evaluation):   Initial Exercise Prescription:  Initial Exercise Prescription - 02/17/20 1500      Date of Initial Exercise RX and Referring Provider   Date 02/17/20    Referring Provider Quay Burow, MD    Expected Discharge Date 04/16/20      NuStep   Level 2    SPM 85    Minutes 15    METs 2.5      Track   Laps 16    Minutes 15    METs 2.86      Prescription Details   Frequency (times per week) 3    Duration Progress to 30 minutes of continuous aerobic without signs/symptoms of physical distress      Intensity   THRR 40-80% of Max Heartrate 56-113    Ratings of Perceived Exertion 11-13    Perceived Dyspnea 0-4      Progression   Progression Continue to progress workloads to maintain intensity without signs/symptoms of physical distress.      Resistance Training   Training Prescription Yes    Weight 3lbs    Reps 10-15           Perform Capillary Blood Glucose checks as needed.  Exercise Prescription Changes:  Exercise Prescription Changes    Row Name 02/23/20 1200  Response to Exercise   Blood Pressure (Admit) 112/64       Blood Pressure (Exercise) 124/70       Blood Pressure (Exit) 114/68       Heart Rate  (Admit) 84 bpm       Heart Rate (Exercise) 103 bpm       Heart Rate (Exit) 82 bpm       Rating of Perceived Exertion (Exercise) 13       Perceived Dyspnea (Exercise) 0       Symptoms None       Comments Pt's first day of exercise       Duration Progress to 30 minutes of  aerobic without signs/symptoms of physical distress       Intensity THRR unchanged         Progression   Progression Continue to progress workloads to maintain intensity without signs/symptoms of physical distress.       Average METs 2.6         Resistance Training   Training Prescription Yes       Weight 3lbs       Reps 10-15       Time 10 Minutes         Interval Training   Interval Training No         NuStep   Level 2       SPM 80       Minutes 15       METs 2.3         Track   Laps 15       Minutes 15       METs 2.6              Exercise Comments:  Exercise Comments    Row Name 02/23/20 1221           Exercise Comments Pt's first day of exercise. Pt responded well to exercise prescription. Will continue to progress workloads as tolerated and monitor.              Exercise Goals and Review:  Exercise Goals    Row Name 02/17/20 1421             Exercise Goals   Increase Physical Activity Yes       Intervention Provide advice, education, support and counseling about physical activity/exercise needs.;Develop an individualized exercise prescription for aerobic and resistive training based on initial evaluation findings, risk stratification, comorbidities and participant's personal goals.       Expected Outcomes Short Term: Attend rehab on a regular basis to increase amount of physical activity.;Long Term: Exercising regularly at least 3-5 days a week.;Long Term: Add in home exercise to make exercise part of routine and to increase amount of physical activity.       Increase Strength and Stamina Yes       Intervention Provide advice, education, support and counseling about physical  activity/exercise needs.;Develop an individualized exercise prescription for aerobic and resistive training based on initial evaluation findings, risk stratification, comorbidities and participant's personal goals.       Expected Outcomes Short Term: Increase workloads from initial exercise prescription for resistance, speed, and METs.;Short Term: Perform resistance training exercises routinely during rehab and add in resistance training at home;Long Term: Improve cardiorespiratory fitness, muscular endurance and strength as measured by increased METs and functional capacity (6MWT)       Able to understand and use rate of perceived exertion (RPE) scale Yes  Intervention Provide education and explanation on how to use RPE scale       Expected Outcomes Short Term: Able to use RPE daily in rehab to express subjective intensity level;Long Term:  Able to use RPE to guide intensity level when exercising independently       Knowledge and understanding of Target Heart Rate Range (THRR) Yes       Intervention Provide education and explanation of THRR including how the numbers were predicted and where they are located for reference       Expected Outcomes Short Term: Able to state/look up THRR;Long Term: Able to use THRR to govern intensity when exercising independently;Short Term: Able to use daily as guideline for intensity in rehab       Able to check pulse independently Yes       Intervention Provide education and demonstration on how to check pulse in carotid and radial arteries.;Review the importance of being able to check your own pulse for safety during independent exercise       Expected Outcomes Short Term: Able to explain why pulse checking is important during independent exercise;Long Term: Able to check pulse independently and accurately       Understanding of Exercise Prescription Yes       Intervention Provide education, explanation, and written materials on patient's individual exercise  prescription       Expected Outcomes Short Term: Able to explain program exercise prescription;Long Term: Able to explain home exercise prescription to exercise independently              Exercise Goals Re-Evaluation :  Exercise Goals Re-Evaluation    Row Name 02/23/20 1222             Exercise Goal Re-Evaluation   Exercise Goals Review Able to understand and use rate of perceived exertion (RPE) scale;Knowledge and understanding of Target Heart Rate Range (THRR);Understanding of Exercise Prescription       Comments Pt's first day of exercise. Pt able to exercise 30 minutes with minimal difficulty. Will continue to monitor.       Expected Outcomes Pt will continue to increase cardiovascular fitness.               Discharge Exercise Prescription (Final Exercise Prescription Changes):  Exercise Prescription Changes - 02/23/20 1200      Response to Exercise   Blood Pressure (Admit) 112/64    Blood Pressure (Exercise) 124/70    Blood Pressure (Exit) 114/68    Heart Rate (Admit) 84 bpm    Heart Rate (Exercise) 103 bpm    Heart Rate (Exit) 82 bpm    Rating of Perceived Exertion (Exercise) 13    Perceived Dyspnea (Exercise) 0    Symptoms None    Comments Pt's first day of exercise    Duration Progress to 30 minutes of  aerobic without signs/symptoms of physical distress    Intensity THRR unchanged      Progression   Progression Continue to progress workloads to maintain intensity without signs/symptoms of physical distress.    Average METs 2.6      Resistance Training   Training Prescription Yes    Weight 3lbs    Reps 10-15    Time 10 Minutes      Interval Training   Interval Training No      NuStep   Level 2    SPM 80    Minutes 15    METs 2.3      Track  Laps 15    Minutes 15    METs 2.6           Nutrition:  Target Goals: Understanding of nutrition guidelines, daily intake of sodium 1500mg , cholesterol 200mg , calories 30% from fat and 7% or less  from saturated fats, daily to have 5 or more servings of fruits and vegetables.  Biometrics:  Pre Biometrics - 02/17/20 1429      Pre Biometrics   Waist Circumference 38.75 inches    Hip Circumference 37 inches    Waist to Hip Ratio 1.05 %    Triceps Skinfold 6 mm    % Body Fat 22.8 %    Grip Strength 32.5 kg    Flexibility 10.75 in    Single Leg Stand 22.25 seconds            Nutrition Therapy Plan and Nutrition Goals:   Nutrition Assessments:   Nutrition Goals Re-Evaluation:   Nutrition Goals Discharge (Final Nutrition Goals Re-Evaluation):   Psychosocial: Target Goals: Acknowledge presence or absence of significant depression and/or stress, maximize coping skills, provide positive support system. Participant is able to verbalize types and ability to use techniques and skills needed for reducing stress and depression.  Initial Review & Psychosocial Screening:  Initial Psych Review & Screening - 02/17/20 1636      Initial Review   Current issues with Current Stress Concerns    Source of Stress Concerns Chronic Illness    Comments [name] says he is concerned about his health as he has had some dizziness after surgery      Family Dynamics   Good Support System? Yes   [name] has his wife for support     Barriers   Psychosocial barriers to participate in program There are no identifiable barriers or psychosocial needs.      Screening Interventions   Interventions Encouraged to exercise    Expected Outcomes Long Term Goal: Stressors or current issues are controlled or eliminated.           Quality of Life Scores:  Quality of Life - 02/17/20 1557      Quality of Life   Select Quality of Life      Quality of Life Scores   Health/Function Pre 25.2 %    Socioeconomic Pre 28.29 %    Psych/Spiritual Pre 28.29 %    Family Pre 27.6 %    GLOBAL Pre 26.82 %          Scores of 19 and below usually indicate a poorer quality of life in these areas.  A difference of   2-3 points is a clinically meaningful difference.  A difference of 2-3 points in the total score of the Quality of Life Index has been associated with significant improvement in overall quality of life, self-image, physical symptoms, and general health in studies assessing change in quality of life.  PHQ-9: Recent Review Flowsheet Data    Depression screen [facility] 2/9 02/17/2020 03/12/2019 03/06/2018 03/01/2017 02/19/2015   Decreased Interest 0 0 0 0 0   Down, Depressed, Hopeless 0 0 0 0 0   PHQ - 2 Score 0 0 0 0 0     Interpretation of Total Score  Total Score Depression Severity:  1-4 = Minimal depression, 5-9 = Mild depression, 10-14 = Moderate depression, 15-19 = Moderately severe depression, 20-27 = Severe depression   Psychosocial Evaluation and Intervention:   Psychosocial Re-Evaluation:   Psychosocial Discharge (Final Psychosocial Re-Evaluation):   Vocational Rehabilitation: Provide vocational  rehab assistance to qualifying candidates.   Vocational Rehab Evaluation & Intervention:  Vocational Rehab - 02/17/20 1639      Initial Vocational Rehab Evaluation & Intervention   Assessment shows need for Vocational Rehabilitation No   [name] is retired and does not need vocational rehab at this time          Education: Education Goals: Education classes will be provided on a weekly basis, covering required topics. Participant will state understanding/return demonstration of topics presented.  Learning Barriers/Preferences:  Learning Barriers/Preferences - 02/17/20 1638      Learning Barriers/Preferences   Learning Barriers Sight   wears reading glasses   Learning Preferences None           Education Topics: Hypertension, Hypertension Reduction -Define heart disease and high blood pressure. Discus how high blood pressure affects the body and ways to reduce high blood pressure.   Exercise and Your Heart -Discuss why it is important to exercise, the FITT principles of exercise,  normal and abnormal responses to exercise, and how to exercise safely.   Angina -Discuss definition of angina, causes of angina, treatment of angina, and how to decrease risk of having angina.   Cardiac Medications -Review what the following cardiac medications are used for, how they affect the body, and side effects that may occur when taking the medications.  Medications include Aspirin, Beta blockers, calcium channel blockers, ACE Inhibitors, angiotensin receptor blockers, diuretics, digoxin, and antihyperlipidemics.   Congestive Heart Failure -Discuss the definition of CHF, how to live with CHF, the signs and symptoms of CHF, and how keep track of weight and sodium intake.   Heart Disease and Intimacy -Discus the effect sexual activity has on the heart, how changes occur during intimacy as we age, and safety during sexual activity.   Smoking Cessation / COPD -Discuss different methods to quit smoking, the health benefits of quitting smoking, and the definition of COPD.   Nutrition I: Fats -Discuss the types of cholesterol, what cholesterol does to the heart, and how cholesterol levels can be controlled.   Nutrition II: Labels -Discuss the different components of food labels and how to read food label   Heart Parts/Heart Disease and PAD -Discuss the anatomy of the heart, the pathway of blood circulation through the heart, and these are affected by heart disease.   Stress I: Signs and Symptoms -Discuss the causes of stress, how stress may lead to anxiety and depression, and ways to limit stress.   Stress II: Relaxation -Discuss different types of relaxation techniques to limit stress.   Warning Signs of Stroke / TIA -Discuss definition of a stroke, what the signs and symptoms are of a stroke, and how to identify when someone is having stroke.   Knowledge Questionnaire Score:  Knowledge Questionnaire Score - 02/17/20 1558      Knowledge Questionnaire Score   Pre  Score 21/24           Core Components/Risk Factors/Patient Goals at Admission:  Personal Goals and Risk Factors at Admission - 02/17/20 1640      Core Components/Risk Factors/Patient Goals on Admission    Weight Management Yes;Weight Maintenance    Intervention Weight Management: Develop a combined nutrition and exercise program designed to reach desired caloric intake, while maintaining appropriate intake of nutrient and fiber, sodium and fats, and appropriate energy expenditure required for the weight goal.;Weight Management: Provide education and appropriate resources to help participant work on and attain dietary goals.;Weight Management/Obesity: Establish reasonable short term and  long term weight goals.    Expected Outcomes Short Term: Continue to assess and modify interventions until short term weight is achieved;Long Term: Adherence to nutrition and physical activity/exercise program aimed toward attainment of established weight goal;Weight Maintenance: Understanding of the daily nutrition guidelines, which includes 25-35% calories from fat, 7% or less cal from saturated fats, less than 200mg  cholesterol, less than 1.5gm of sodium, & 5 or more servings of fruits and vegetables daily;Understanding of distribution of calorie intake throughout the day with the consumption of 4-5 meals/snacks;Understanding recommendations for meals to include 15-35% energy as protein, 25-35% energy from fat, 35-60% energy from carbohydrates, less than 200mg  of dietary cholesterol, 20-35 gm of total fiber daily    Diabetes Yes    Intervention Provide education about signs/symptoms and action to take for hypo/hyperglycemia.;Provide education about proper nutrition, including hydration, and aerobic/resistive exercise prescription along with prescribed medications to achieve blood glucose in normal ranges: Fasting glucose 65-99 mg/dL    Expected Outcomes Short Term: Participant verbalizes understanding of the  signs/symptoms and immediate care of hyper/hypoglycemia, proper foot care and importance of medication, aerobic/resistive exercise and nutrition plan for blood glucose control.;Long Term: Attainment of HbA1C < 7%.    Heart Failure Yes    Intervention Provide a combined exercise and nutrition program that is supplemented with education, support and counseling about heart failure. Directed toward relieving symptoms such as shortness of breath, decreased exercise tolerance, and extremity edema.    Expected Outcomes Improve functional capacity of life;Short term: Attendance in program 2-3 days a week with increased exercise capacity. Reported lower sodium intake. Reported increased fruit and vegetable intake. Reports medication compliance.;Short term: Daily weights obtained and reported for increase. Utilizing diuretic protocols set by physician.;Long term: Adoption of self-care skills and reduction of barriers for early signs and symptoms recognition and intervention leading to self-care maintenance.    Hypertension Yes    Intervention Provide education on lifestyle modifcations including regular physical activity/exercise, weight management, moderate sodium restriction and increased consumption of fresh fruit, vegetables, and low fat dairy, alcohol moderation, and smoking cessation.;Monitor prescription use compliance.    Expected Outcomes Short Term: Continued assessment and intervention until BP is < 140/33mm HG in hypertensive participants. < 130/79mm HG in hypertensive participants with diabetes, heart failure or chronic kidney disease.;Long Term: Maintenance of blood pressure at goal levels.    Lipids Yes    Intervention Provide education and support for participant on nutrition & aerobic/resistive exercise along with prescribed medications to achieve LDL 70mg , HDL >40mg .    Expected Outcomes Short Term: Participant states understanding of desired cholesterol values and is compliant with medications  prescribed. Participant is following exercise prescription and nutrition guidelines.;Long Term: Cholesterol controlled with medications as prescribed, with individualized exercise RX and with personalized nutrition plan. Value goals: LDL < 70mg , HDL > 40 mg.    Stress Yes    Intervention Offer individual and/or small group education and counseling on adjustment to heart disease, stress management and health-related lifestyle change. Teach and support self-help strategies.    Expected Outcomes Short Term: Participant demonstrates changes in health-related behavior, relaxation and other stress management skills, ability to obtain effective social support, and compliance with psychotropic medications if prescribed.;Long Term: Emotional wellbeing is indicated by absence of clinically significant psychosocial distress or social isolation.           Core Components/Risk Factors/Patient Goals Review:   Goals and Risk Factor Review    Row Name 02/23/20 1435 02/23/20 1436  Core Components/Risk Factors/Patient Goals Review   Personal Goals Review Weight Management/Obesity;Lipids;Stress;Diabetes;Heart Failure;Hypertension Weight Management/Obesity;Lipids;Stress;Diabetes;Heart Failure;Hypertension      Review -- [name] started exercise on 02/23/20 and did well with exercise.      Expected Outcomes -- [name] will continue to particiapte in phase2 cardiac rehab for exercise, nutrition and lifestyle modifications             Core Components/Risk Factors/Patient Goals at Discharge (Final Review):   Goals and Risk Factor Review - 02/23/20 1436      Core Components/Risk Factors/Patient Goals Review   Personal Goals Review Weight Management/Obesity;Lipids;Stress;Diabetes;Heart Failure;Hypertension    Review [name] started exercise on 02/23/20 and did well with exercise.    Expected Outcomes [name] will continue to particiapte in phase2 cardiac rehab for exercise, nutrition and lifestyle modifications            ITP Comments:  ITP Comments    Row Name 02/17/20 1416 02/26/20 1610         ITP Comments Medical Director- Dr. Fransico Him, MD 30 Day ITP Review. [name] started exercise at cardiac rehab on 02/23/20 and did well with exercise on his first day.             Comments: See ITP comments.Barnet Pall, RN,BSN 02/26/2020 4:13 PM

## 2020-02-27 ENCOUNTER — Encounter (HOSPITAL_COMMUNITY)
Admission: RE | Admit: 2020-02-27 | Discharge: 2020-02-27 | Disposition: A | Discharge status: Home / Self Care | Payer: PPO | Source: Ambulatory Visit | Attending: Cardiovascular Disease | Admitting: Cardiovascular Disease

## 2020-02-27 ENCOUNTER — Other Ambulatory Visit: Payer: Self-pay

## 2020-02-27 DIAGNOSIS — Z951 Presence of aortocoronary bypass graft: Secondary | ICD-10-CM | POA: Diagnosis not present

## 2020-02-27 LAB — GLUCOSE, CAPILLARY
Glucose-Capillary: 247 mg/dL — ABNORMAL HIGH (ref 70–99)
Glucose-Capillary: 160 mg/dL — ABNORMAL HIGH (ref 70–99)

## 2020-03-01 ENCOUNTER — Other Ambulatory Visit: Payer: Self-pay

## 2020-03-01 ENCOUNTER — Other Ambulatory Visit: Payer: Self-pay | Admitting: Internal Medicine

## 2020-03-01 ENCOUNTER — Encounter (HOSPITAL_COMMUNITY)
Admission: RE | Admit: 2020-03-01 | Discharge: 2020-03-01 | Disposition: A | Discharge status: Home / Self Care | Payer: PPO | Source: Ambulatory Visit | Attending: Cardiovascular Disease | Admitting: Cardiovascular Disease

## 2020-03-01 DIAGNOSIS — Z951 Presence of aortocoronary bypass graft: Secondary | ICD-10-CM

## 2020-03-01 DIAGNOSIS — I5022 Chronic systolic (congestive) heart failure: Secondary | ICD-10-CM

## 2020-03-01 DIAGNOSIS — I480 Paroxysmal atrial fibrillation: Secondary | ICD-10-CM

## 2020-03-01 NOTE — Progress Notes (Signed)
Zeshan Sena Worst 79 y.o. male Nutrition Note  Visit Diagnosis: 11/28/19 CABG x3  Past Medical History:  Diagnosis Date  . BPH (benign prostatic hypertrophy)   . Coronary artery disease   . History of kidney stones   . History of melanoma excision OCT 2010  . Hyperlipidemia   . Hypertension   . Left ureteral calculus   . Nephrolithiasis    bilateral  . Type 2 diabetes mellitus ([place])      Medications reviewed.   Current Outpatient Medications:  .  alum hydroxide-mag trisilicate (GAVISCON) 56-38 MG CHEW chewable tablet, Chew 2 tablets by mouth 3 (three) times daily as needed for indigestion or heartburn., Disp: , Rfl:  .  aspirin EC 81 MG EC tablet, Take 1 tablet (81 mg total) by mouth daily., Disp: , Rfl:  .  Cholecalciferol (VITAMIN D3) 250 MCG (10000 UT) TABS, Take 1 tablet by mouth 2 (two) times daily., Disp: , Rfl:  .  Cyanocobalamin (VITAMIN B-12) 1000 MCG SUBL, Place under the tongue., Disp: , Rfl:  .  finasteride (PROSCAR) 5 MG tablet, Take 1 tablet (5 mg total) by mouth daily., Disp: 90 tablet, Rfl: 3 .  metFORMIN (GLUCOPHAGE-XR) 500 MG 24 hr tablet, Take 1 tablet (500 mg total) by mouth 2 (two) times daily., Disp: 180 tablet, Rfl: 3 .  Multiple Vitamin (MULTIVITAMIN) tablet, Take 1 tablet by mouth daily., Disp: , Rfl:  .  omeprazole (PRILOSEC) 20 MG capsule, Take 20 mg by mouth as needed., Disp: , Rfl:  .  Potassium Citrate 15 MEQ (1620 MG) TBCR, Take 1 tablet by mouth in the morning and at bedtime., Disp: , Rfl:  .  rosuvastatin (CRESTOR) 10 MG tablet, Take 1 tablet (10 mg total) by mouth daily., Disp: 30 tablet, Rfl: 11 .  sacubitril-valsartan (ENTRESTO) 24-26 MG, Take 1 tablet by mouth 2 (two) times daily., Disp: 60 tablet, Rfl: 3 .  spironolactone (ALDACTONE) 25 MG tablet, Take 1 tablet (25 mg total) by mouth daily., Disp: 30 tablet, Rfl: 3 .  timolol (TIMOPTIC) 0.5 % ophthalmic solution, Place 1 drop into both eyes 2 (two) times daily. , Disp: , Rfl:    Ht Readings from  Last 1 Encounters:  02/25/20 5\' 6"  (1.676 m)     Wt Readings from Last 3 Encounters:  02/25/20 152 lb (68.9 kg)  02/17/20 151 lb 14.4 oz (68.9 kg)  02/12/20 152 lb 3.2 oz (69 kg)     There is no height or weight on file to calculate BMI.   Social History   Tobacco Use  Smoking Status Never Smoker  Smokeless Tobacco Never Used     Lab Results  Component Value Date   CHOL 188 01/16/2020   Lab Results  Component Value Date   HDL 33.20 (L) 01/16/2020   Lab Results  Component Value Date   LDLCALC 125 (H) 01/16/2020   Lab Results  Component Value Date   TRIG 146.0 01/16/2020     Lab Results  Component Value Date   HGBA1C 7.2 (H) 11/27/2019     CBG (last 3)  Recent Labs    02/27/20 1212  GLUCAP 160*     Nutrition Note  Spoke with pt. Nutrition Plan and Nutrition Survey goals reviewed with pt. Pt is following a Heart Healthy diet.  Pt has Type 2 Diabetes. Last A1c indicates blood glucose well-controlled. He reports checking CBgs about 1x/month. He reports diligence in keeping A1C within range so his PCP is ok with random CBG  checks. Pt also instructed to check when he feels poorly. He has not had to do this. He has been checking CBGs pre exercise and states post prandial CBGs have been running 160-200 mg/dl after breakfast. He reports going to diabetes education about 10 years ago. He uses LowSalt instead of regular salt and he and wife read labels. He does not drink sugary beverages and is mindful of carbohydrate intake as it relates to CBGs. He lost 15 lbs in/after hospital stay. He has gained about 12  Lbs back and feels more energy and comfortable with his weight.  Pt expressed understanding of the information reviewed.    Nutrition Diagnosis ? Excessive carbohydrate intake related to food preferences and food related knowledge deficit as evidenced by self reported PPGs ~200 mg/dl and A1C 7.2   Nutrition Intervention ? Pt's individual nutrition plan  reviewed with pt. ? Benefits of adopting Heart Healthy diet discussed when Medficts reviewed. ? Continue client-centered nutrition education by RD, as part of interdisciplinary care.  Goal(s) ? Pt to build a healthy plate including vegetables, fruits, whole grains, and low-fat dairy products in a heart healthy meal plan. ? Pt to make good choices when eating out at restaurants ? CBG concentrations in the normal range or as close to normal as is safely possible.  Plan:   Will provide client-centered nutrition education as part of interdisciplinary care  Monitor and evaluate progress toward nutrition goal with team.   Michaele Offer, MS, RDN, LDN

## 2020-03-03 ENCOUNTER — Other Ambulatory Visit: Payer: Self-pay

## 2020-03-03 ENCOUNTER — Encounter (HOSPITAL_COMMUNITY)
Admission: RE | Admit: 2020-03-03 | Discharge: 2020-03-03 | Disposition: A | Discharge status: Home / Self Care | Payer: PPO | Source: Ambulatory Visit | Attending: Cardiovascular Disease | Admitting: Cardiovascular Disease

## 2020-03-03 DIAGNOSIS — Z951 Presence of aortocoronary bypass graft: Secondary | ICD-10-CM | POA: Diagnosis not present

## 2020-03-05 ENCOUNTER — Other Ambulatory Visit: Payer: Self-pay

## 2020-03-05 ENCOUNTER — Encounter (HOSPITAL_COMMUNITY)
Admission: RE | Admit: 2020-03-05 | Discharge: 2020-03-05 | Disposition: A | Discharge status: Home / Self Care | Payer: PPO | Source: Ambulatory Visit | Attending: Cardiovascular Disease | Admitting: Cardiovascular Disease

## 2020-03-05 DIAGNOSIS — Z951 Presence of aortocoronary bypass graft: Secondary | ICD-10-CM | POA: Diagnosis not present

## 2020-03-08 ENCOUNTER — Encounter (HOSPITAL_COMMUNITY)
Admission: RE | Admit: 2020-03-08 | Discharge: 2020-03-08 | Disposition: A | Discharge status: Home / Self Care | Payer: PPO | Source: Ambulatory Visit | Attending: Cardiovascular Disease | Admitting: Cardiovascular Disease

## 2020-03-08 ENCOUNTER — Other Ambulatory Visit: Payer: Self-pay

## 2020-03-08 DIAGNOSIS — Z951 Presence of aortocoronary bypass graft: Secondary | ICD-10-CM

## 2020-03-09 ENCOUNTER — Telehealth: Payer: Self-pay | Admitting: Pharmacist

## 2020-03-09 NOTE — Telephone Encounter (Signed)
Called pt to follow up with rosuvastatin tolerability. Pt states he spoke with Dr [name] and hasn't started rosuvastatin yet. He wanted to wait until he sees his PCP Dr [name] on 9/8. He is still experiencing dizziness and is hoping he can run additional tests to determine the cause. He hopes to resolve dizziness before starting cholesterol medication. Home BP has been 485-462 systolic. He does notice some dizziness with positional changes. He inquires about medication causes of this. Discussed that Entresto and spironolactone are both lowering his BP, however we prefer him to continue on therapy due to his heart failure. He will continue to keep an eye on BP, take his time with positional changes, and will call over to CHF clinic if he does experience hypotension. Will call pt in a few weeks to follow up to see if he's started rosuvastatin 10mg  daily.

## 2020-03-10 ENCOUNTER — Encounter (HOSPITAL_COMMUNITY)
Admission: RE | Admit: 2020-03-10 | Discharge: 2020-03-10 | Disposition: A | Discharge status: Home / Self Care | Payer: PPO | Source: Ambulatory Visit | Attending: Cardiovascular Disease | Admitting: Cardiovascular Disease

## 2020-03-10 ENCOUNTER — Other Ambulatory Visit: Payer: Self-pay

## 2020-03-10 DIAGNOSIS — Z951 Presence of aortocoronary bypass graft: Secondary | ICD-10-CM | POA: Diagnosis not present

## 2020-03-10 NOTE — Progress Notes (Signed)
I have reviewed a Home Exercise Prescription with James Braun . James Braun is currently exercising at home. The patient was advised to continue to exercise 2-3 days a week for 30-40 minutes at [facility]. Pt will also walk at home on the days he does not go to the [facility] or cardiac rehab.  James Braun and I discussed how to progress their exercise prescription. The patient stated that they understand the exercise prescription. We reviewed exercise guidelines, target heart rate during exercise, RPE Scale, weather conditions, NTG use, endpoints for exercise, warmup and cool down. Patient is encouraged to come to me with any questions. I will continue to follow up with the patient to assist them with progression and safety.     Carma Lair MS, CEP CSCS 1:54 PM 03/10/2020

## 2020-03-12 ENCOUNTER — Other Ambulatory Visit: Payer: Self-pay

## 2020-03-12 ENCOUNTER — Encounter (HOSPITAL_COMMUNITY)
Admission: RE | Admit: 2020-03-12 | Discharge: 2020-03-12 | Disposition: A | Discharge status: Home / Self Care | Payer: PPO | Source: Ambulatory Visit | Attending: Cardiovascular Disease | Admitting: Cardiovascular Disease

## 2020-03-12 DIAGNOSIS — Z951 Presence of aortocoronary bypass graft: Secondary | ICD-10-CM

## 2020-03-15 ENCOUNTER — Other Ambulatory Visit: Payer: Self-pay

## 2020-03-15 ENCOUNTER — Encounter (HOSPITAL_COMMUNITY)
Admission: RE | Admit: 2020-03-15 | Discharge: 2020-03-15 | Disposition: A | Discharge status: Home / Self Care | Payer: PPO | Source: Ambulatory Visit | Attending: Cardiovascular Disease | Admitting: Cardiovascular Disease

## 2020-03-15 DIAGNOSIS — Z951 Presence of aortocoronary bypass graft: Secondary | ICD-10-CM

## 2020-03-17 ENCOUNTER — Other Ambulatory Visit (INDEPENDENT_AMBULATORY_CARE_PROVIDER_SITE_OTHER): Payer: PPO

## 2020-03-17 ENCOUNTER — Other Ambulatory Visit: Payer: Self-pay

## 2020-03-17 ENCOUNTER — Encounter (HOSPITAL_COMMUNITY)
Admission: RE | Admit: 2020-03-17 | Discharge: 2020-03-17 | Disposition: A | Discharge status: Home / Self Care | Payer: PPO | Source: Ambulatory Visit | Attending: Cardiovascular Disease | Admitting: Cardiovascular Disease

## 2020-03-17 DIAGNOSIS — X32XXXD Exposure to sunlight, subsequent encounter: Secondary | ICD-10-CM | POA: Diagnosis not present

## 2020-03-17 DIAGNOSIS — I480 Paroxysmal atrial fibrillation: Secondary | ICD-10-CM

## 2020-03-17 DIAGNOSIS — Z1283 Encounter for screening for malignant neoplasm of skin: Secondary | ICD-10-CM | POA: Diagnosis not present

## 2020-03-17 DIAGNOSIS — I5022 Chronic systolic (congestive) heart failure: Secondary | ICD-10-CM | POA: Diagnosis not present

## 2020-03-17 DIAGNOSIS — I110 Hypertensive heart disease with heart failure: Secondary | ICD-10-CM | POA: Insufficient documentation

## 2020-03-17 DIAGNOSIS — Z85820 Personal history of malignant melanoma of skin: Secondary | ICD-10-CM | POA: Diagnosis not present

## 2020-03-17 DIAGNOSIS — L570 Actinic keratosis: Secondary | ICD-10-CM | POA: Diagnosis not present

## 2020-03-17 DIAGNOSIS — Z951 Presence of aortocoronary bypass graft: Secondary | ICD-10-CM | POA: Diagnosis not present

## 2020-03-17 DIAGNOSIS — D225 Melanocytic nevi of trunk: Secondary | ICD-10-CM | POA: Diagnosis not present

## 2020-03-17 DIAGNOSIS — Z79899 Other long term (current) drug therapy: Secondary | ICD-10-CM | POA: Insufficient documentation

## 2020-03-17 DIAGNOSIS — E785 Hyperlipidemia, unspecified: Secondary | ICD-10-CM | POA: Insufficient documentation

## 2020-03-17 DIAGNOSIS — Z08 Encounter for follow-up examination after completed treatment for malignant neoplasm: Secondary | ICD-10-CM | POA: Diagnosis not present

## 2020-03-17 DIAGNOSIS — B351 Tinea unguium: Secondary | ICD-10-CM | POA: Diagnosis not present

## 2020-03-17 LAB — RENAL FUNCTION PANEL
Albumin: 4.4 g/dL (ref 3.5–5.2)
BUN: 25 mg/dL — ABNORMAL HIGH (ref 6–23)
CO2: 28 mEq/L (ref 19–32)
Calcium: 9.3 mg/dL (ref 8.4–10.5)
Chloride: 103 mEq/L (ref 96–112)
Creatinine, Ser: 1.31 mg/dL (ref 0.40–1.50)
GFR: 52.64 mL/min — ABNORMAL LOW (ref >60.00)
Glucose, Bld: 141 mg/dL — ABNORMAL HIGH (ref 70–99)
Phosphorus: 3.8 mg/dL (ref 2.3–4.6)
Potassium: 4.5 mEq/L (ref 3.5–5.1)
Sodium: 138 mEq/L (ref 135–145)

## 2020-03-17 LAB — T4, FREE: Free T4: 0.65 ng/dL (ref 0.60–1.60)

## 2020-03-17 LAB — HEPATIC FUNCTION PANEL
ALT: 13 U/L (ref 0–53)
AST: 15 U/L (ref 0–37)
Albumin: 4.4 g/dL (ref 3.5–5.2)
Alkaline Phosphatase: 49 U/L (ref 39–117)
Bilirubin, Direct: 0.1 mg/dL (ref 0.0–0.3)
Total Bilirubin: 0.5 mg/dL (ref 0.2–1.2)
Total Protein: 6.5 g/dL (ref 6.0–8.3)

## 2020-03-17 LAB — CBC
HCT: 37.5 % — ABNORMAL LOW (ref 39.0–52.0)
Hemoglobin: 12 g/dL — ABNORMAL LOW (ref 13.0–17.0)
MCHC: 31.9 g/dL (ref 30.0–36.0)
MCV: 85.8 fl (ref 78.0–100.0)
Platelets: 377 10*3/uL (ref 150.0–400.0)
RBC: 4.37 Mil/uL (ref 4.22–5.81)
RDW: 23.4 % — ABNORMAL HIGH (ref 11.5–15.5)
WBC: 5.8 10*3/uL (ref 4.0–10.5)

## 2020-03-17 LAB — LIPID PANEL
Cholesterol: 181 mg/dL (ref 0–200)
HDL: 43.1 mg/dL (ref >39.00)
LDL Cholesterol: 112 mg/dL — ABNORMAL HIGH (ref 0–99)
NonHDL: 138.29
Total CHOL/HDL Ratio: 4
Triglycerides: 130 mg/dL (ref 0.0–149.0)
VLDL: 26 mg/dL (ref 0.0–40.0)

## 2020-03-19 ENCOUNTER — Other Ambulatory Visit: Payer: Self-pay

## 2020-03-19 ENCOUNTER — Encounter (HOSPITAL_COMMUNITY)
Admission: RE | Admit: 2020-03-19 | Discharge: 2020-03-19 | Disposition: A | Discharge status: Home / Self Care | Payer: PPO | Source: Ambulatory Visit | Attending: Cardiovascular Disease | Admitting: Cardiovascular Disease

## 2020-03-19 DIAGNOSIS — Z951 Presence of aortocoronary bypass graft: Secondary | ICD-10-CM

## 2020-03-24 ENCOUNTER — Ambulatory Visit (INDEPENDENT_AMBULATORY_CARE_PROVIDER_SITE_OTHER): Payer: PPO | Admitting: Internal Medicine

## 2020-03-24 ENCOUNTER — Encounter (HOSPITAL_COMMUNITY)
Admission: RE | Admit: 2020-03-24 | Discharge: 2020-03-24 | Disposition: A | Discharge status: Home / Self Care | Payer: PPO | Source: Ambulatory Visit | Attending: Cardiovascular Disease | Admitting: Cardiovascular Disease

## 2020-03-24 ENCOUNTER — Encounter: Payer: Self-pay | Admitting: Internal Medicine

## 2020-03-24 ENCOUNTER — Other Ambulatory Visit: Payer: Self-pay

## 2020-03-24 VITALS — BP 110/66 | HR 73 | Temp 97.7°F | Ht 66.0 in | Wt 159.0 lb

## 2020-03-24 DIAGNOSIS — E1121 Type 2 diabetes mellitus with diabetic nephropathy: Secondary | ICD-10-CM

## 2020-03-24 DIAGNOSIS — I10 Essential (primary) hypertension: Secondary | ICD-10-CM

## 2020-03-24 DIAGNOSIS — N1831 Chronic kidney disease, stage 3a: Secondary | ICD-10-CM | POA: Diagnosis not present

## 2020-03-24 DIAGNOSIS — Z0000 Encounter for general adult medical examination without abnormal findings: Secondary | ICD-10-CM

## 2020-03-24 DIAGNOSIS — I5022 Chronic systolic (congestive) heart failure: Secondary | ICD-10-CM

## 2020-03-24 DIAGNOSIS — Z951 Presence of aortocoronary bypass graft: Secondary | ICD-10-CM | POA: Diagnosis not present

## 2020-03-24 DIAGNOSIS — I2510 Atherosclerotic heart disease of native coronary artery without angina pectoris: Secondary | ICD-10-CM

## 2020-03-24 NOTE — Assessment & Plan Note (Signed)
S/P CABG Now on statin

## 2020-03-24 NOTE — Assessment & Plan Note (Signed)
BP Readings from Last 3 Encounters:  03/24/20 110/66  02/25/20 123/79  02/17/20 110/80   Good control on meds for CHF

## 2020-03-24 NOTE — Progress Notes (Signed)
Hearing Screening   Method: Audiometry   125Hz  250Hz  500Hz  1000Hz  2000Hz  3000Hz  4000Hz  6000Hz  8000Hz   Right ear:   20 20 20  20     Left ear:   20 20 20  25     Vision Screening Comments: Will schedule appt soon.

## 2020-03-24 NOTE — Assessment & Plan Note (Signed)
Lab Results  Component Value Date   HGBA1C 7.2 (H) 11/27/2019   Will recheck A1c in a month Just on metformin

## 2020-03-24 NOTE — Progress Notes (Signed)
Subjective:    Patient ID: James Braun, male    DOB: [birth year], 79 y.o.   MRN: [id number]  HPI Here for Medicare wellness visit and follow up of chronic health conditions This visit occurred during the SARS-CoV-2 public health emergency.  Safety protocols were in place, including screening questions prior to the visit, additional usage of staff PPE, and extensive cleaning of exam room while observing appropriate contact time as indicated for disinfecting solutions.   Reviewed form and advanced directives Reviewed other doctors No alcohol or tobacco Exercising regularly Vision is stable--needs reevaluation soon Hearing is fine Tripped once over hose---hurt left ribs and head (CT and rib films negative) No depression or anhedonia Independent with instrumental ADLs No sig memory issues--mild recall issues  Concerned about the blood loss still Is ready to pursue one last colonoscopy  Still swimmy headed--if he turns his head quickly Occasional orthostatic dizziness--this is improved Still doing cardiac rehab No chest pain No SOB No edema No palpitations Has started rosuvastatin --per Dr Sung Amabile  Continues on finasteride Nocturia down to once Flow is good  Checking sugars at cardiac rehab Post prandial 150-170. After exercise is 120's GFR in 50's  Current Outpatient Medications on File Prior to Visit  Medication Sig Dispense Refill  . alum hydroxide-mag trisilicate (GAVISCON) 76-22 MG CHEW chewable tablet Chew 2 tablets by mouth 3 (three) times daily as needed for indigestion or heartburn.    Marland Kitchen aspirin EC 81 MG EC tablet Take 1 tablet (81 mg total) by mouth daily.    . Cholecalciferol (VITAMIN D3) 250 MCG (10000 UT) TABS Take 1 tablet by mouth 2 (two) times daily.    . Cyanocobalamin (VITAMIN B-12) 1000 MCG SUBL Place under the tongue.    . finasteride (PROSCAR) 5 MG tablet Take 1 tablet (5 mg total) by mouth daily. 90 tablet 3  . metFORMIN (GLUCOPHAGE-XR) 500 MG 24  hr tablet Take 1 tablet (500 mg total) by mouth 2 (two) times daily. 180 tablet 3  . Multiple Vitamin (MULTIVITAMIN) tablet Take 1 tablet by mouth daily.    Marland Kitchen omeprazole (PRILOSEC) 20 MG capsule Take 20 mg by mouth as needed.    . Potassium Citrate 15 MEQ (1620 MG) TBCR Take 1 tablet by mouth in the morning and at bedtime.    . rosuvastatin (CRESTOR) 10 MG tablet Take 10 mg by mouth daily.    . sacubitril-valsartan (ENTRESTO) 24-26 MG Take 1 tablet by mouth 2 (two) times daily. 60 tablet 3  . spironolactone (ALDACTONE) 25 MG tablet Take 1 tablet (25 mg total) by mouth daily. 30 tablet 3  . timolol (TIMOPTIC) 0.5 % ophthalmic solution Place 1 drop into both eyes 2 (two) times daily.      No current facility-administered medications on file prior to visit.    Allergies  Allergen Reactions  . Atorvastatin Other (See Comments)    myalgias  . Morphine And Related Other (See Comments)     hypotension  . Levofloxacin Rash  . Septra [Sulfamethoxazole-Trimethoprim] Rash    Past Medical History:  Diagnosis Date  . BPH (benign prostatic hypertrophy)   . Coronary artery disease   . History of kidney stones   . History of melanoma excision OCT 2010  . Hyperlipidemia   . Hypertension   . Left ureteral calculus   . Nephrolithiasis    bilateral  . Type 2 diabetes mellitus ([place])     Past Surgical History:  Procedure Laterality Date  . CARDIAC CATHETERIZATION    .  CHOLECYSTECTOMY  july 2000  . CORONARY ARTERY BYPASS GRAFT N/A 11/28/2019   Procedure: CORONARY ARTERY BYPASS GRAFTING (CABG) x3, using left internal mammary artery and Saphenous vein harvested endoscopically;  Surgeon: [name], MD;  Location: [place];  Service: Open Heart Surgery;  Laterality: N/A;  . CYSTOSCOPY WITH STENT PLACEMENT Left 11/14/2012   Procedure: CYSTOSCOPY WITH STENT PLACEMENT;  Surgeon: [name], MD;  Location: [facility];  Service: Urology;  Laterality: Left;  .  CYSTOSCOPY/RETROGRADE/URETEROSCOPY/STONE EXTRACTION WITH BASKET Left 11/14/2012   Procedure: CYSTOSCOPY/RETROGRADE/URETEROSCOPY/STONE EXTRACTION WITH BASKET;  Surgeon: [name], MD;  Location: [facility];  Service: Urology;  Laterality: Left;  . EXTRACORPOREAL SHOCK WAVE LITHOTRIPSY  JULY 2000   X2  . HOLMIUM LASER APPLICATION Left 09/18/1935   Procedure: HOLMIUM LASER APPLICATION;  Surgeon: [name], MD;  Location: [facility];  Service: Urology;  Laterality: Left;  . LEFT HEART CATH AND CORONARY ANGIOGRAPHY N/A 11/27/2019   Procedure: LEFT HEART CATH AND CORONARY ANGIOGRAPHY;  Surgeon: [name], MD;  Location: [facility];  Service: Cardiovascular;  Laterality: N/A;  . LEFT URETEROSCOPIC STONE EXTRACTION  02-22-2007  . NEPHROLITHOTOMY Right 09-28-2010   percutaneous  . ORIF CLAVICLE FRACTURE  2006  . PROSTATE BIOPSY  1998   neg  . SHOULDER ARTHROSCOPY  1/15   Dr [name]  . TEE WITHOUT CARDIOVERSION N/A 11/28/2019   Procedure: TRANSESOPHAGEAL ECHOCARDIOGRAM (TEE);  Surgeon: [name], Collier Salina, MD;  Location: [place];  Service: Open Heart Surgery;  Laterality: N/A;  . TONSILLECTOMY      Family History  Problem Relation Age of Onset  . Hypertension Mother   . Diabetes Father   . Hypertension Father   . Coronary artery disease Neg Hx   . Cancer Neg Hx     Social History   Socioeconomic History  . Marital status: Married    Spouse name: Not on file  . Number of children: 2  . Years of education: Not on file  . Highest education level: Not on file  Occupational History  . 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 Occupation: Buys very distressed houses and rehabilitates  . Occupation: Does mediation  Tobacco Use  . Smoking status: Never Smoker  . Smokeless tobacco: Never Used  Substance and Sexual Activity  . Alcohol use: No  . Drug use: No  . Sexual activity: Not on file  Other Topics Concern  . Not on file    Social History Narrative   Has living will   Wife is health care POA---alternate is committee of physicians   Would accept resuscitation---wouldn't accept prolonged ventilation   No tube feeds if cognitively unaware   Social Determinants of Health   Financial Resource Strain:   . Difficulty of Paying Living Expenses: Not on file  Food Insecurity:   . Worried About Charity fundraiser in the Last Year: Not on file  . Ran Out of Food in the Last Year: Not on file  Transportation Needs:   . Lack of Transportation (Medical): Not on file  . Lack of Transportation (Non-Medical): Not on file  Physical Activity:   . Days of Exercise per Week: Not on file  . Minutes of Exercise per Session: Not on file  Stress:   . Feeling of Stress : Not on file  Social Connections:   . Frequency of Communication with Friends and Family: Not on file  . Frequency of Social Gatherings with Friends and Family:  Not on file  . Attends Religious Services: Not on file  . Active Member of Clubs or Organizations: Not on file  . Attends Archivist Meetings: Not on file  . Marital Status: Not on file  Intimate Partner Violence:   . Fear of Current or Ex-Partner: Not on file  . Emotionally Abused: Not on file  . Physically Abused: Not on file  . Sexually Abused: Not on file   Review of Systems Swollen testicle---may be scrotum (is better from hospital---left) Appetite is good Has gained back weight he lost Sleeps well Wears seat belt Teeth okay Bowels are stabilizing. Still seems dark at times. Taking probiotics No sig back or joint pains No heartburn or dysphagia    Objective:   Physical Exam Constitutional:      Appearance: Normal appearance.  HENT:     Mouth/Throat:     Comments: No oral lesions Eyes:     Conjunctiva/sclera: Conjunctivae normal.     Pupils: Pupils are equal, round, and reactive to light.  Cardiovascular:     Rate and Rhythm: Normal rate and regular rhythm.      Pulses: Normal pulses.     Heart sounds: No murmur heard.  No gallop.   Pulmonary:     Effort: Pulmonary effort is normal.     Breath sounds: Normal breath sounds. No wheezing or rales.  Abdominal:     Palpations: Abdomen is soft.     Tenderness: There is no abdominal tenderness.  Genitourinary:    Comments: Left hydrocele Musculoskeletal:     Cervical back: Neck supple.     Right lower leg: No edema.     Left lower leg: No edema.  Lymphadenopathy:     Cervical: No cervical adenopathy.  Skin:    Findings: No rash.     Comments: No foot lesions  Neurological:     Mental Status: He is alert and oriented to person, place, and time.     Comments: President--- "Edmon Crape, Sheela Stack----- Obama" [phone] [id number] Recall 3/3  Normal sensation in feet  Psychiatric:        Mood and Affect: Mood normal.        Behavior: Behavior normal.            Assessment & Plan:

## 2020-03-24 NOTE — Assessment & Plan Note (Signed)
I have personally reviewed the Medicare Annual Wellness questionnaire and have noted 1. The patient's medical and social history 2. Their use of alcohol, tobacco or illicit drugs 3. Their current medications and supplements 4. The patient's functional ability including ADL's, fall risks, home safety risks and hearing or visual             impairment. 5. Diet and physical activities 6. Evidence for depression or mood disorders  The patients weight, height, BMI and visual acuity have been recorded in the chart I have made referrals, counseling and provided education to the patient based review of the above and I have provided the pt with a written personalized care plan for preventive services.  I have provided you with a copy of your personalized plan for preventive services. Please take the time to review along with your updated medication list.  Will need another colonoscopy due to anemia and past polyps COVID booster when available No PSA due to age Will get flu vaccine later in fall Working on exercise

## 2020-03-24 NOTE — Assessment & Plan Note (Signed)
GFR in 50's  is on ARB

## 2020-03-24 NOTE — Assessment & Plan Note (Signed)
Compensated with entresto and spironolactone

## 2020-03-25 ENCOUNTER — Other Ambulatory Visit: Payer: Self-pay

## 2020-03-25 ENCOUNTER — Ambulatory Visit: Payer: PPO | Admitting: Podiatry

## 2020-03-25 ENCOUNTER — Telehealth: Payer: Self-pay

## 2020-03-25 ENCOUNTER — Encounter: Payer: Self-pay | Admitting: Podiatry

## 2020-03-25 DIAGNOSIS — N1831 Chronic kidney disease, stage 3a: Secondary | ICD-10-CM

## 2020-03-25 DIAGNOSIS — Q828 Other specified congenital malformations of skin: Secondary | ICD-10-CM | POA: Diagnosis not present

## 2020-03-25 DIAGNOSIS — E1121 Type 2 diabetes mellitus with diabetic nephropathy: Secondary | ICD-10-CM

## 2020-03-25 DIAGNOSIS — B351 Tinea unguium: Secondary | ICD-10-CM

## 2020-03-25 DIAGNOSIS — M79676 Pain in unspecified toe(s): Secondary | ICD-10-CM

## 2020-03-25 NOTE — Telephone Encounter (Signed)
Patient has been scheduled for a visit with Dr [name] on 2-29-2021.

## 2020-03-25 NOTE — Progress Notes (Signed)
Cardiac Individual Treatment Plan  Patient Details  Name: James Braun MRN: [id number] Date of Birth: August 26, 1940 Referring Provider:     CARDIAC REHAB PHASE II ORIENTATION from 02/17/2020 in [facility]  Referring Provider Quay Burow, MD      Initial Encounter Date:    CARDIAC REHAB PHASE II ORIENTATION from 02/17/2020 in [facility]  Date 02/17/20      Visit Diagnosis: 11/28/19 CABG x3  Patient's Home Medications on Admission:  Current Outpatient Medications:  .  alum hydroxide-mag trisilicate (GAVISCON) 38-18 MG CHEW chewable tablet, Chew 2 tablets by mouth 3 (three) times daily as needed for indigestion or heartburn., Disp: , Rfl:  .  aspirin EC 81 MG EC tablet, Take 1 tablet (81 mg total) by mouth daily., Disp: , Rfl:  .  Cholecalciferol (VITAMIN D3) 250 MCG (10000 UT) TABS, Take 1 tablet by mouth 2 (two) times daily., Disp: , Rfl:  .  Cyanocobalamin (VITAMIN B-12) 1000 MCG SUBL, Place under the tongue., Disp: , Rfl:  .  finasteride (PROSCAR) 5 MG tablet, Take 1 tablet (5 mg total) by mouth daily., Disp: 90 tablet, Rfl: 3 .  metFORMIN (GLUCOPHAGE-XR) 500 MG 24 hr tablet, Take 1 tablet (500 mg total) by mouth 2 (two) times daily., Disp: 180 tablet, Rfl: 3 .  Multiple Vitamin (MULTIVITAMIN) tablet, Take 1 tablet by mouth daily., Disp: , Rfl:  .  omeprazole (PRILOSEC) 20 MG capsule, Take 20 mg by mouth as needed., Disp: , Rfl:  .  Potassium Citrate 15 MEQ (1620 MG) TBCR, Take 1 tablet by mouth in the morning and at bedtime., Disp: , Rfl:  .  rosuvastatin (CRESTOR) 10 MG tablet, Take 10 mg by mouth daily., Disp: , Rfl:  .  sacubitril-valsartan (ENTRESTO) 24-26 MG, Take 1 tablet by mouth 2 (two) times daily., Disp: 60 tablet, Rfl: 3 .  spironolactone (ALDACTONE) 25 MG tablet, Take 1 tablet (25 mg total) by mouth daily., Disp: 30 tablet, Rfl: 3 .  timolol (TIMOPTIC) 0.5 % ophthalmic solution, Place 1 drop into both eyes 2  (two) times daily. , Disp: , Rfl:   Past Medical History: Past Medical History:  Diagnosis Date  . BPH (benign prostatic hypertrophy)   . Coronary artery disease   . History of kidney stones   . History of melanoma excision OCT 2010  . Hyperlipidemia   . Hypertension   . Left ureteral calculus   . Nephrolithiasis    bilateral  . Type 2 diabetes mellitus ([place])     Tobacco Use: Social History   Tobacco Use  Smoking Status Never Smoker  Smokeless Tobacco Never Used    Labs: Recent Review Flowsheet Data    Labs for ITP Cardiac and Pulmonary Rehab Latest Ref Rng & Units 12/06/2019 12/07/2019 12/08/2019 01/16/2020 03/17/2020   Cholestrol 0 - 200 mg/dL - - - 188 181   LDLCALC 0 - 99 mg/dL - - - 125(H) 112(H)   LDLDIRECT mg/dL - - - - -   HDL >39.00 mg/dL - - - 33.20(L) 43.10   Trlycerides 0 - 149 mg/dL - - - 146.0 130.0   Hemoglobin A1c 4.8 - 5.6 % - - - - -   PHART 7.35 - 7.45 - - - - -   PCO2ART 32 - 48 mmHg - - - - -   HCO3 20.0 - 28.0 mmol/L - - - - -   TCO2 22 - 32 mmol/L - - - - -  ACIDBASEDEF 0.0 - 2.0 mmol/L - - - - -   O2SAT % 53.6 56.0 45.6 - -      Capillary Blood Glucose: Lab Results  Component Value Date   GLUCAP 160 (H) 02/27/2020   GLUCAP 247 (H) 02/27/2020   GLUCAP 133 (H) 02/23/2020   GLUCAP 194 (H) 02/23/2020   GLUCAP 160 (H) 02/17/2020     Exercise Target Goals: Exercise Program Goal: Individual exercise prescription set using results from initial 6 min walk test and THRR while considering  patient's activity barriers and safety.   Exercise Prescription Goal: Starting with aerobic activity 30 plus minutes a day, 3 days per week for initial exercise prescription. Provide home exercise prescription and guidelines that participant acknowledges understanding prior to discharge.  Activity Barriers & Risk Stratification:  Activity Barriers & Cardiac Risk Stratification - 02/17/20 1429      Activity Barriers & Cardiac Risk Stratification   Activity  Barriers History of Falls;Other (comment)    Comments Dizziness    Cardiac Risk Stratification High           6 Minute Walk:  6 Minute Walk    Row Name 02/17/20 1456         6 Minute Walk   Phase Initial     Distance 1466 feet     Walk Time 6 minutes     # of Rest Breaks 0     MPH 2.78     METS 2.88     RPE 11     Perceived Dyspnea  0     VO2 Peak 10.08     Symptoms No     Resting HR 91 bpm     Resting BP 110/80     Resting Oxygen Saturation  97 %     Exercise Oxygen Saturation  during 6 min walk 98 %     Max Ex. HR 104 bpm     Max Ex. BP 130/72     2 Minute Post BP 122/80            Oxygen Initial Assessment:   Oxygen Re-Evaluation:   Oxygen Discharge (Final Oxygen Re-Evaluation):   Initial Exercise Prescription:  Initial Exercise Prescription - 02/17/20 1500      Date of Initial Exercise RX and Referring Provider   Date 02/17/20    Referring Provider Quay Burow, MD    Expected Discharge Date 04/16/20      NuStep   Level 2    SPM 85    Minutes 15    METs 2.5      Track   Laps 16    Minutes 15    METs 2.86      Prescription Details   Frequency (times per week) 3    Duration Progress to 30 minutes of continuous aerobic without signs/symptoms of physical distress      Intensity   THRR 40-80% of Max Heartrate 56-113    Ratings of Perceived Exertion 11-13    Perceived Dyspnea 0-4      Progression   Progression Continue to progress workloads to maintain intensity without signs/symptoms of physical distress.      Resistance Training   Training Prescription Yes    Weight 3lbs    Reps 10-15           Perform Capillary Blood Glucose checks as needed.  Exercise Prescription Changes:  Exercise Prescription Changes    Row Name 02/23/20 1200 03/10/20 1300 03/17/20 1541 03/24/20 1410  Response to Exercise   Blood Pressure (Admit) 112/64 130/70 120/70 122/74    Blood Pressure (Exercise) 124/70 118/64 122/66 128/72    Blood  Pressure (Exit) 114/68 108/68 118/72 110/64    Heart Rate (Admit) 84 bpm 82 bpm 79 bpm 83 bpm    Heart Rate (Exercise) 103 bpm 106 bpm 103 bpm 113 bpm    Heart Rate (Exit) 82 bpm 88 bpm 79 bpm 82 bpm    Rating of Perceived Exertion (Exercise) 13 12 12 12     Perceived Dyspnea (Exercise) 0 0 0 0    Symptoms None None None None    Comments Pt's first day of exercise Reviewed Home Exercise Plan None None    Duration Progress to 30 minutes of  aerobic without signs/symptoms of physical distress Continue with 30 min of aerobic exercise without signs/symptoms of physical distress. Continue with 30 min of aerobic exercise without signs/symptoms of physical distress. Continue with 30 min of aerobic exercise without signs/symptoms of physical distress.    Intensity THRR unchanged THRR unchanged THRR unchanged THRR unchanged      Progression   Progression Continue to progress workloads to maintain intensity without signs/symptoms of physical distress. Continue to progress workloads to maintain intensity without signs/symptoms of physical distress. Continue to progress workloads to maintain intensity without signs/symptoms of physical distress. Continue to progress workloads to maintain intensity without signs/symptoms of physical distress.    Average METs 2.6 2.6 2.63 2.7      Resistance Training   Training Prescription Yes No No No    Weight 3lbs -- -- --    Reps 10-15 -- -- --    Time 10 Minutes -- -- --      Interval Training   Interval Training No -- -- --      NuStep   Level 2 2 2 2     SPM 80 80 85 85    Minutes 15 15 15 15     METs 2.3 2.2 2.4 2.4      Track   Laps 15 16 16 17     Minutes 15 15 15 15     METs 2.6 2.7 2.86 2.86      Home Exercise Plan   Plans to continue exercise at -- [employer] (comment)  [place] (comment)  [place] (comment)  Local [employer] [facility]    Frequency -- Add 2 additional days to program  exercise sessions. Add 2 additional days to program exercise sessions. Add 3 additional days to program exercise sessions.    Initial Home Exercises Provided -- 03/10/20 03/10/20 03/10/20           Exercise Comments:  Exercise Comments    Row Name 02/23/20 1221 03/10/20 1348 03/25/20 1412       Exercise Comments Pt's first day of exercise. Pt responded well to exercise prescription. Will continue to progress workloads as tolerated and monitor. Reviewed Home Exercise Plan with pt. Pt has joined [employer] with his wife. They will be exercising 2-3 days a week at [facility]. Will continue to monitor and progress pt. Reviewed METs with pt. Pt is continuing to respond well to exercise prescription. Pt has started going to [facility] with wife for exercise. Pt is doing well in program. Will continue to monitor.            Exercise Goals and Review:  Exercise Goals    Row Name 02/17/20 1421  Exercise Goals   Increase Physical Activity Yes       Intervention Provide advice, education, support and counseling about physical activity/exercise needs.;Develop an individualized exercise prescription for aerobic and resistive training based on initial evaluation findings, risk stratification, comorbidities and participant's personal goals.       Expected Outcomes Short Term: Attend rehab on a regular basis to increase amount of physical activity.;Long Term: Exercising regularly at least 3-5 days a week.;Long Term: Add in home exercise to make exercise part of routine and to increase amount of physical activity.       Increase Strength and Stamina Yes       Intervention Provide advice, education, support and counseling about physical activity/exercise needs.;Develop an individualized exercise prescription for aerobic and resistive training based on initial evaluation findings, risk stratification, comorbidities and participant's personal goals.       Expected Outcomes Short Term: Increase  workloads from initial exercise prescription for resistance, speed, and METs.;Short Term: Perform resistance training exercises routinely during rehab and add in resistance training at home;Long Term: Improve cardiorespiratory fitness, muscular endurance and strength as measured by increased METs and functional capacity (6MWT)       Able to understand and use rate of perceived exertion (RPE) scale Yes       Intervention Provide education and explanation on how to use RPE scale       Expected Outcomes Short Term: Able to use RPE daily in rehab to express subjective intensity level;Long Term:  Able to use RPE to guide intensity level when exercising independently       Knowledge and understanding of Target Heart Rate Range (THRR) Yes       Intervention Provide education and explanation of THRR including how the numbers were predicted and where they are located for reference       Expected Outcomes Short Term: Able to state/look up THRR;Long Term: Able to use THRR to govern intensity when exercising independently;Short Term: Able to use daily as guideline for intensity in rehab       Able to check pulse independently Yes       Intervention Provide education and demonstration on how to check pulse in carotid and radial arteries.;Review the importance of being able to check your own pulse for safety during independent exercise       Expected Outcomes Short Term: Able to explain why pulse checking is important during independent exercise;Long Term: Able to check pulse independently and accurately       Understanding of Exercise Prescription Yes       Intervention Provide education, explanation, and written materials on patient's individual exercise prescription       Expected Outcomes Short Term: Able to explain program exercise prescription;Long Term: Able to explain home exercise prescription to exercise independently              Exercise Goals Re-Evaluation :  Exercise Goals Re-Evaluation    Row  Name 02/23/20 1222 03/10/20 1351 03/25/20 1414         Exercise Goal Re-Evaluation   Exercise Goals Review Able to understand and use rate of perceived exertion (RPE) scale;Knowledge and understanding of Target Heart Rate Range (THRR);Understanding of Exercise Prescription Able to understand and use rate of perceived exertion (RPE) scale;Knowledge and understanding of Target Heart Rate Range (THRR);Understanding of Exercise Prescription;Increase Strength and Stamina;Increase Physical Activity;Able to check pulse independently Able to understand and use rate of perceived exertion (RPE) scale;Knowledge and understanding of Target Heart Rate Range (THRR);Understanding  of Exercise Prescription;Increase Strength and Stamina;Increase Physical Activity;Able to check pulse independently     Comments Pt's first day of exercise. Pt able to exercise 30 minutes with minimal difficulty. Will continue to monitor. Reviewed HEP with pt. Discussed THRR, RPE Scale, weather precautions, NTG use, endpoints of exercise, warmup and cool down. Pt is responding well to exercise while in cardiac rehab. Will follow up with pt to see if they are willing to try level 3 on Nustep. Pt is getting stronger and ready for a new level.     Expected Outcomes Pt will continue to increase cardiovascular fitness. Pt will exercise with pt at [facility], 2-3 days a week 30-40 minutes. Pt will continue to work to increase stamina. Pt will continue to exercise most days of the week and increase cardiovascular strength.             Discharge Exercise Prescription (Final Exercise Prescription Changes):  Exercise Prescription Changes - 03/24/20 1410      Response to Exercise   Blood Pressure (Admit) 122/74    Blood Pressure (Exercise) 128/72    Blood Pressure (Exit) 110/64    Heart Rate (Admit) 83 bpm    Heart Rate (Exercise) 113 bpm    Heart Rate (Exit) 82 bpm    Rating of Perceived Exertion (Exercise) 12    Perceived Dyspnea  (Exercise) 0    Symptoms None    Comments None    Duration Continue with 30 min of aerobic exercise without signs/symptoms of physical distress.    Intensity THRR unchanged      Progression   Progression Continue to progress workloads to maintain intensity without signs/symptoms of physical distress.    Average METs 2.7      Resistance Training   Training Prescription No      NuStep   Level 2    SPM 85    Minutes 15    METs 2.4      Track   Laps 17    Minutes 15    METs 2.86      Home Exercise Plan   Plans to continue exercise at [employer] (comment)   [facility] [employer] [facility]   Frequency Add 3 additional days to program exercise sessions.    Initial Home Exercises Provided 03/10/20           Nutrition:  Target Goals: Understanding of nutrition guidelines, daily intake of sodium 1500mg , cholesterol 200mg , calories 30% from fat and 7% or less from saturated fats, daily to have 5 or more servings of fruits and vegetables.  Biometrics:  Pre Biometrics - 02/17/20 1429      Pre Biometrics   Waist Circumference 38.75 inches    Hip Circumference 37 inches    Waist to Hip Ratio 1.05 %    Triceps Skinfold 6 mm    % Body Fat 22.8 %    Grip Strength 32.5 kg    Flexibility 10.75 in    Single Leg Stand 22.25 seconds            Nutrition Therapy Plan and Nutrition Goals:  Nutrition Therapy & Goals - 03/25/20 0737      Nutrition Therapy   Diet Heart Healthy      Personal Nutrition Goals   Nutrition Goal Pt to build a healthy plate including vegetables, fruits, whole grains, and low-fat dairy products in a heart healthy meal plan.    Personal Goal #2 Pt to make good choices when eating out at restaurants  Personal Goal #3 CBG concentrations in the normal range or as close to normal as is safely possible.      Intervention Plan   Intervention Prescribe, educate and counsel regarding individualized specific dietary modifications aiming towards targeted core  components such as weight, hypertension, lipid management, diabetes, heart failure and other comorbidities.    Expected Outcomes Short Term Goal: Understand basic principles of dietary content, such as calories, fat, sodium, cholesterol and nutrients.           Nutrition Assessments:  Nutrition Assessments - 03/18/20 1114      MEDFICTS Scores   Pre Score 54           Nutrition Goals Re-Evaluation:  Nutrition Goals Re-Evaluation    Row Name 03/25/20 0738             Goals   Current Weight 159 lb (72.1 kg)       Nutrition Goal Pt to build a healthy plate including vegetables, fruits, whole grains, and low-fat dairy products in a heart healthy meal plan.         Personal Goal #2 Re-Evaluation   Personal Goal #2 Pt to make good choices when eating out at restaurants         Personal Goal #3 Re-Evaluation   Personal Goal #3 CBG concentrations in the normal range or as close to normal as is safely possible.              Nutrition Goals Discharge (Final Nutrition Goals Re-Evaluation):  Nutrition Goals Re-Evaluation - 03/25/20 0738      Goals   Current Weight 159 lb (72.1 kg)    Nutrition Goal Pt to build a healthy plate including vegetables, fruits, whole grains, and low-fat dairy products in a heart healthy meal plan.      Personal Goal #2 Re-Evaluation   Personal Goal #2 Pt to make good choices when eating out at restaurants      Personal Goal #3 Re-Evaluation   Personal Goal #3 CBG concentrations in the normal range or as close to normal as is safely possible.           Psychosocial: Target Goals: Acknowledge presence or absence of significant depression and/or stress, maximize coping skills, provide positive support system. Participant is able to verbalize types and ability to use techniques and skills needed for reducing stress and depression.  Initial Review & Psychosocial Screening:  Initial Psych Review & Screening - 02/17/20 1636      Initial Review    Current issues with Current Stress Concerns    Source of Stress Concerns Chronic Illness    Comments James Braun says he is concerned about his health as he has had some dizziness after surgery      Family Dynamics   Good Support System? Yes   James Braun has his wife for support     Barriers   Psychosocial barriers to participate in program There are no identifiable barriers or psychosocial needs.      Screening Interventions   Interventions Encouraged to exercise    Expected Outcomes Long Term Goal: Stressors or current issues are controlled or eliminated.           Quality of Life Scores:  Quality of Life - 02/17/20 1557      Quality of Life   Select Quality of Life      Quality of Life Scores   Health/Function Pre 25.2 %    Socioeconomic Pre 28.29 %    Psych/Spiritual  Pre 28.29 %    Family Pre 27.6 %    GLOBAL Pre 26.82 %          Scores of 19 and below usually indicate a poorer quality of life in these areas.  A difference of  2-3 points is a clinically meaningful difference.  A difference of 2-3 points in the total score of the Quality of Life Index has been associated with significant improvement in overall quality of life, self-image, physical symptoms, and general health in studies assessing change in quality of life.  PHQ-9: Recent Review Flowsheet Data    Depression screen [facility] 2/9 03/24/2020 02/17/2020 03/12/2019 03/06/2018 03/01/2017   Decreased Interest 0 0 0 0 0   Down, Depressed, Hopeless 0 0 0 0 0   PHQ - 2 Score 0 0 0 0 0     Interpretation of Total Score  Total Score Depression Severity:  1-4 = Minimal depression, 5-9 = Mild depression, 10-14 = Moderate depression, 15-19 = Moderately severe depression, 20-27 = Severe depression   Psychosocial Evaluation and Intervention:   Psychosocial Re-Evaluation:  Psychosocial Re-Evaluation    [place] Name 03/25/20 1636             Psychosocial Re-Evaluation   Current issues with None Identified       Comments James Braun has not  voiced any further stressors or concerns since he has been participating in phase 2 cardiac rehab       Expected Outcomes will continue to monitor and offer support as needed       Interventions Encouraged to attend Cardiac Rehabilitation for the exercise       Continue Psychosocial Services  No Follow up required              Psychosocial Discharge (Final Psychosocial Re-Evaluation):  Psychosocial Re-Evaluation - 03/25/20 1636      Psychosocial Re-Evaluation   Current issues with None Identified    Comments James Braun has not voiced any further stressors or concerns since he has been participating in phase 2 cardiac rehab    Expected Outcomes will continue to monitor and offer support as needed    Interventions Encouraged to attend Cardiac Rehabilitation for the exercise    Continue Psychosocial Services  No Follow up required           Vocational Rehabilitation: Provide vocational rehab assistance to qualifying candidates.   Vocational Rehab Evaluation & Intervention:  Vocational Rehab - 02/17/20 1639      Initial Vocational Rehab Evaluation & Intervention   Assessment shows need for Vocational Rehabilitation No   James Braun is retired and does not need vocational rehab at this time          Education: Education Goals: Education classes will be provided on a weekly basis, covering required topics. Participant will state understanding/return demonstration of topics presented.  Learning Barriers/Preferences:  Learning Barriers/Preferences - 02/17/20 1638      Learning Barriers/Preferences   Learning Barriers Sight   wears reading glasses   Learning Preferences None           Education Topics: Hypertension, Hypertension Reduction -Define heart disease and high blood pressure. Discus how high blood pressure affects the body and ways to reduce high blood pressure.   Exercise and Your Heart -Discuss why it is important to exercise, the FITT principles of exercise, normal and  abnormal responses to exercise, and how to exercise safely.   Angina -Discuss definition of angina, causes of angina, treatment of angina, and how  to decrease risk of having angina.   Cardiac Medications -Review what the following cardiac medications are used for, how they affect the body, and side effects that may occur when taking the medications.  Medications include Aspirin, Beta blockers, calcium channel blockers, ACE Inhibitors, angiotensin receptor blockers, diuretics, digoxin, and antihyperlipidemics.   Congestive Heart Failure -Discuss the definition of CHF, how to live with CHF, the signs and symptoms of CHF, and how keep track of weight and sodium intake.   Heart Disease and Intimacy -Discus the effect sexual activity has on the heart, how changes occur during intimacy as we age, and safety during sexual activity.   Smoking Cessation / COPD -Discuss different methods to quit smoking, the health benefits of quitting smoking, and the definition of COPD.   Nutrition I: Fats -Discuss the types of cholesterol, what cholesterol does to the heart, and how cholesterol levels can be controlled.   Nutrition II: Labels -Discuss the different components of food labels and how to read food label   Heart Parts/Heart Disease and PAD -Discuss the anatomy of the heart, the pathway of blood circulation through the heart, and these are affected by heart disease.   Stress I: Signs and Symptoms -Discuss the causes of stress, how stress may lead to anxiety and depression, and ways to limit stress.   Stress II: Relaxation -Discuss different types of relaxation techniques to limit stress.   Warning Signs of Stroke / TIA -Discuss definition of a stroke, what the signs and symptoms are of a stroke, and how to identify when someone is having stroke.   Knowledge Questionnaire Score:  Knowledge Questionnaire Score - 02/17/20 1558      Knowledge Questionnaire Score   Pre Score 21/24            Core Components/Risk Factors/Patient Goals at Admission:  Personal Goals and Risk Factors at Admission - 02/17/20 1640      Core Components/Risk Factors/Patient Goals on Admission    Weight Management Yes;Weight Maintenance    Intervention Weight Management: Develop a combined nutrition and exercise program designed to reach desired caloric intake, while maintaining appropriate intake of nutrient and fiber, sodium and fats, and appropriate energy expenditure required for the weight goal.;Weight Management: Provide education and appropriate resources to help participant work on and attain dietary goals.;Weight Management/Obesity: Establish reasonable short term and long term weight goals.    Expected Outcomes Short Term: Continue to assess and modify interventions until short term weight is achieved;Long Term: Adherence to nutrition and physical activity/exercise program aimed toward attainment of established weight goal;Weight Maintenance: Understanding of the daily nutrition guidelines, which includes 25-35% calories from fat, 7% or less cal from saturated fats, less than 200mg  cholesterol, less than 1.5gm of sodium, & 5 or more servings of fruits and vegetables daily;Understanding of distribution of calorie intake throughout the day with the consumption of 4-5 meals/snacks;Understanding recommendations for meals to include 15-35% energy as protein, 25-35% energy from fat, 35-60% energy from carbohydrates, less than 200mg  of dietary cholesterol, 20-35 gm of total fiber daily    Diabetes Yes    Intervention Provide education about signs/symptoms and action to take for hypo/hyperglycemia.;Provide education about proper nutrition, including hydration, and aerobic/resistive exercise prescription along with prescribed medications to achieve blood glucose in normal ranges: Fasting glucose 65-99 mg/dL    Expected Outcomes Short Term: Participant verbalizes understanding of the signs/symptoms and  immediate care of hyper/hypoglycemia, proper foot care and importance of medication, aerobic/resistive exercise and nutrition plan for blood  glucose control.;Long Term: Attainment of HbA1C < 7%.    Heart Failure Yes    Intervention Provide a combined exercise and nutrition program that is supplemented with education, support and counseling about heart failure. Directed toward relieving symptoms such as shortness of breath, decreased exercise tolerance, and extremity edema.    Expected Outcomes Improve functional capacity of life;Short term: Attendance in program 2-3 days a week with increased exercise capacity. Reported lower sodium intake. Reported increased fruit and vegetable intake. Reports medication compliance.;Short term: Daily weights obtained and reported for increase. Utilizing diuretic protocols set by physician.;Long term: Adoption of self-care skills and reduction of barriers for early signs and symptoms recognition and intervention leading to self-care maintenance.    Hypertension Yes    Intervention Provide education on lifestyle modifcations including regular physical activity/exercise, weight management, moderate sodium restriction and increased consumption of fresh fruit, vegetables, and low fat dairy, alcohol moderation, and smoking cessation.;Monitor prescription use compliance.    Expected Outcomes Short Term: Continued assessment and intervention until BP is < 140/77mm HG in hypertensive participants. < 130/10mm HG in hypertensive participants with diabetes, heart failure or chronic kidney disease.;Long Term: Maintenance of blood pressure at goal levels.    Lipids Yes    Intervention Provide education and support for participant on nutrition & aerobic/resistive exercise along with prescribed medications to achieve LDL 70mg , HDL >40mg .    Expected Outcomes Short Term: Participant states understanding of desired cholesterol values and is compliant with medications prescribed.  Participant is following exercise prescription and nutrition guidelines.;Long Term: Cholesterol controlled with medications as prescribed, with individualized exercise RX and with personalized nutrition plan. Value goals: LDL < 70mg , HDL > 40 mg.    Stress Yes    Intervention Offer individual and/or small group education and counseling on adjustment to heart disease, stress management and health-related lifestyle change. Teach and support self-help strategies.    Expected Outcomes Short Term: Participant demonstrates changes in health-related behavior, relaxation and other stress management skills, ability to obtain effective social support, and compliance with psychotropic medications if prescribed.;Long Term: Emotional wellbeing is indicated by absence of clinically significant psychosocial distress or social isolation.           Core Components/Risk Factors/Patient Goals Review:   Goals and Risk Factor Review    Row Name 02/23/20 1435 02/23/20 1436 03/25/20 1637         Core Components/Risk Factors/Patient Goals Review   Personal Goals Review Weight Management/Obesity;Lipids;Stress;Diabetes;Heart Failure;Hypertension Weight Management/Obesity;Lipids;Stress;Diabetes;Heart Failure;Hypertension Weight Management/Obesity;Lipids;Stress;Diabetes;Heart Failure;Hypertension     Review -- James Braun started exercise on 02/23/20 and did well with exercise. James Braun has been doing well with exercise. James Braun's vital sign and CBG's have been stable.     Expected Outcomes -- James Braun will continue to particiapte in phase2 cardiac rehab for exercise, nutrition and lifestyle modifications James Braun will continue to particiapte in phase2 cardiac rehab for exercise, nutrition and lifestyle modifications            Core Components/Risk Factors/Patient Goals at Discharge (Final Review):   Goals and Risk Factor Review - 03/25/20 1637      Core Components/Risk Factors/Patient Goals Review   Personal Goals Review Weight  Management/Obesity;Lipids;Stress;Diabetes;Heart Failure;Hypertension    Review James Braun has been doing well with exercise. James Braun's vital sign and CBG's have been stable.    Expected Outcomes James Braun will continue to particiapte in phase2 cardiac rehab for exercise, nutrition and lifestyle modifications           ITP Comments:  ITP Comments  Firth Name 02/17/20 1416 02/26/20 1610 03/25/20 1635       ITP Comments Medical Director- Dr. Fransico Him, MD 30 Day ITP Review. James Braun started exercise at cardiac rehab on 02/23/20 and did well with exercise on his first day. 30 day ITP Review. James Braun has good attendance and participation in phase 2 cardiac rehab.            Comments: See ITP comments.Barnet Pall, RN,BSN 03/25/2020 4:39 PM

## 2020-03-25 NOTE — Progress Notes (Signed)
This patient returns to my office for at risk foot care.  This patient requires this care by a professional since this patient will be at risk due to having  Diabetes and CKD.    This patient is unable to cut nails himself since the patient cannot reach his nails.These nails are painful walking and wearing shoes.  This patient presents for at risk foot care today.  General Appearance  Alert, conversant and in no acute stress.  Vascular  Dorsalis pedis and posterior tibial  pulses are palpable  bilaterally.  Capillary return is within normal limits  bilaterally. Temperature is within normal limits  bilaterally.  Neurologic  Senn-Weinstein monofilament wire test diminished  bilaterally. Muscle power within normal limits bilaterally.  Nails Thick disfigured discolored nails with subungual debris  from hallux to fifth toes bilaterally. No evidence of bacterial infection or drainage bilaterally.  Orthopedic  No limitations of motion  feet .  No crepitus or effusions noted.  No bony pathology or digital deformities noted.  Skin  normotropic skin with no porokeratosis noted bilaterally.  No signs of infections or ulcers noted.   Porokeratosis sub 5th  B/L and left heel.  Onychomycosis  Pain in right toes  Pain in left toes  Porokeratosis  B/L.  Consent was obtained for treatment procedures.   Mechanical debridement of nails 1-5  bilaterally performed with a nail nipper.  Filed with dremel without incident. No infection or ulcer.  Debride callus with # 15 blade.   Return office visit  12 weeks                Told patient to return for periodic foot care and evaluation due to potential at risk complications.   Gardiner Barefoot DPM

## 2020-03-25 NOTE — Telephone Encounter (Signed)
-----   Message from [place], MD sent at 03/24/2020  5:01 PM EDT ----- Denice Paradise, Thanks for the note - I'll get him into clinic to check up , given all that has transpired since I last saw him.  Vivien Rota,  Please contact patient to arrange clinic follow up and discuss anemia and colonoscopy as well as his GERD.  - HD

## 2020-03-26 ENCOUNTER — Encounter (HOSPITAL_COMMUNITY)
Admission: RE | Admit: 2020-03-26 | Discharge: 2020-03-26 | Disposition: A | Discharge status: Home / Self Care | Payer: PPO | Source: Ambulatory Visit | Attending: Cardiovascular Disease | Admitting: Cardiovascular Disease

## 2020-03-26 DIAGNOSIS — Z951 Presence of aortocoronary bypass graft: Secondary | ICD-10-CM

## 2020-03-26 DIAGNOSIS — H401132 Primary open-angle glaucoma, bilateral, moderate stage: Secondary | ICD-10-CM | POA: Diagnosis not present

## 2020-03-26 LAB — HM DIABETES EYE EXAM

## 2020-03-29 ENCOUNTER — Other Ambulatory Visit: Payer: Self-pay

## 2020-03-29 ENCOUNTER — Encounter (HOSPITAL_COMMUNITY)
Admission: RE | Admit: 2020-03-29 | Discharge: 2020-03-29 | Disposition: A | Discharge status: Home / Self Care | Payer: PPO | Source: Ambulatory Visit | Attending: Cardiovascular Disease | Admitting: Cardiovascular Disease

## 2020-03-29 DIAGNOSIS — Z951 Presence of aortocoronary bypass graft: Secondary | ICD-10-CM

## 2020-03-30 ENCOUNTER — Telehealth: Payer: Self-pay | Admitting: Pharmacist

## 2020-03-30 NOTE — Telephone Encounter (Signed)
Called pt to follow up with lipids. He states he started on rosuvastatin 10mg  daily after his labs were checked on 9/1. He states Dr [name] will be following his lipids from now on and plans to recheck labs within the next few months.

## 2020-03-31 ENCOUNTER — Other Ambulatory Visit: Payer: Self-pay

## 2020-03-31 ENCOUNTER — Encounter (HOSPITAL_COMMUNITY)
Admission: RE | Admit: 2020-03-31 | Discharge: 2020-03-31 | Disposition: A | Discharge status: Home / Self Care | Payer: PPO | Source: Ambulatory Visit | Attending: Cardiovascular Disease | Admitting: Cardiovascular Disease

## 2020-03-31 DIAGNOSIS — Z951 Presence of aortocoronary bypass graft: Secondary | ICD-10-CM | POA: Diagnosis not present

## 2020-04-02 ENCOUNTER — Encounter (HOSPITAL_COMMUNITY): Payer: PPO

## 2020-04-05 ENCOUNTER — Encounter (HOSPITAL_COMMUNITY)
Admission: RE | Admit: 2020-04-05 | Discharge: 2020-04-05 | Disposition: A | Discharge status: Home / Self Care | Payer: PPO | Source: Ambulatory Visit | Attending: Cardiovascular Disease | Admitting: Cardiovascular Disease

## 2020-04-05 ENCOUNTER — Other Ambulatory Visit: Payer: Self-pay

## 2020-04-05 DIAGNOSIS — Z951 Presence of aortocoronary bypass graft: Secondary | ICD-10-CM | POA: Diagnosis not present

## 2020-04-07 ENCOUNTER — Encounter (HOSPITAL_COMMUNITY)
Admission: RE | Admit: 2020-04-07 | Discharge: 2020-04-07 | Disposition: A | Discharge status: Home / Self Care | Payer: PPO | Source: Ambulatory Visit | Attending: Cardiovascular Disease | Admitting: Cardiovascular Disease

## 2020-04-07 ENCOUNTER — Other Ambulatory Visit: Payer: Self-pay

## 2020-04-07 DIAGNOSIS — Z951 Presence of aortocoronary bypass graft: Secondary | ICD-10-CM | POA: Diagnosis not present

## 2020-04-09 ENCOUNTER — Encounter (HOSPITAL_COMMUNITY)
Admission: RE | Admit: 2020-04-09 | Discharge: 2020-04-09 | Disposition: A | Discharge status: Home / Self Care | Payer: PPO | Source: Ambulatory Visit | Attending: Cardiovascular Disease | Admitting: Cardiovascular Disease

## 2020-04-09 ENCOUNTER — Other Ambulatory Visit: Payer: Self-pay

## 2020-04-09 VITALS — Ht 66.0 in | Wt 162.0 lb

## 2020-04-09 DIAGNOSIS — Z951 Presence of aortocoronary bypass graft: Secondary | ICD-10-CM | POA: Diagnosis not present

## 2020-04-09 DIAGNOSIS — H401122 Primary open-angle glaucoma, left eye, moderate stage: Secondary | ICD-10-CM | POA: Diagnosis not present

## 2020-04-12 ENCOUNTER — Other Ambulatory Visit: Payer: Self-pay

## 2020-04-12 ENCOUNTER — Encounter (HOSPITAL_COMMUNITY)
Admission: RE | Admit: 2020-04-12 | Discharge: 2020-04-12 | Disposition: A | Discharge status: Home / Self Care | Payer: PPO | Source: Ambulatory Visit | Attending: Cardiovascular Disease | Admitting: Cardiovascular Disease

## 2020-04-12 DIAGNOSIS — Z951 Presence of aortocoronary bypass graft: Secondary | ICD-10-CM | POA: Diagnosis not present

## 2020-04-14 ENCOUNTER — Ambulatory Visit (INDEPENDENT_AMBULATORY_CARE_PROVIDER_SITE_OTHER): Payer: PPO | Admitting: Gastroenterology

## 2020-04-14 ENCOUNTER — Encounter (HOSPITAL_COMMUNITY)
Admission: RE | Admit: 2020-04-14 | Discharge: 2020-04-14 | Disposition: A | Discharge status: Home / Self Care | Payer: PPO | Source: Ambulatory Visit | Attending: Cardiovascular Disease | Admitting: Cardiovascular Disease

## 2020-04-14 ENCOUNTER — Encounter: Payer: Self-pay | Admitting: Gastroenterology

## 2020-04-14 ENCOUNTER — Other Ambulatory Visit: Payer: Self-pay

## 2020-04-14 VITALS — BP 128/72 | HR 92 | Ht 67.2 in | Wt 160.4 lb

## 2020-04-14 DIAGNOSIS — Z951 Presence of aortocoronary bypass graft: Secondary | ICD-10-CM | POA: Diagnosis not present

## 2020-04-14 DIAGNOSIS — K449 Diaphragmatic hernia without obstruction or gangrene: Secondary | ICD-10-CM | POA: Diagnosis not present

## 2020-04-14 DIAGNOSIS — D509 Iron deficiency anemia, unspecified: Secondary | ICD-10-CM | POA: Diagnosis not present

## 2020-04-14 MED ORDER — PLENVU 140 G PO SOLR
140.0000 g | ORAL | 0 refills | Status: DC
Start: 2020-04-14 — End: 2020-04-22

## 2020-04-14 NOTE — Patient Instructions (Signed)
If you are age 80 or older, your body mass index should be between 23-30. Your Body mass index is 24.97 kg/m. If this is out of the aforementioned range listed, please consider follow up with your Primary Care Provider.  If you are age 19 or younger, your body mass index should be between 19-25. Your Body mass index is 24.97 kg/m. If this is out of the aformentioned range listed, please consider follow up with your Primary Care Provider.   You have been scheduled for a colonoscopy. Please follow written instructions given to you at your visit today.  Please pick up your prep supplies at the pharmacy within the next 1-3 days. If you use inhalers (even only as needed), please bring them with you on the day of your procedure.  It was a pleasure to see you today!  Dr. [name]

## 2020-04-14 NOTE — Progress Notes (Signed)
[facility] Progress Note  Chief Complaint: Iron deficiency anemia and hiatal hernia  Subjective  History:  James Braun sees me for the first time since his upper endoscopy in April of this year.  He had chest pain that seem to overlap between probable digestive and cardiac nature. Large hiatal hernia was found, no obvious erosions within the hernia sac.  I initially recommended evaluation by surgery, but also message his cardiologist with concerns that this chest pain might be anginal.  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  He has slowly recovered well since then.  He is still having some difficulty with lightheadedness and thinks it may be related to some of his medicines.  He has addressed it with Dr. [name].  Colonoscopy Sept 2015 James Braun) 4 polyps of unknown pathology (see April 2021 office consult)  I also sought help for anemia at that April visit.  He has been on once daily iron tablets with stools that have turned dark (though they were also reportedly dark before taking that medicine), and hemoglobin has improved on that treatment.  ROS: Cardiovascular:  no further chest pain Respiratory: no dyspnea  He does not get dysphagia as long as he is careful and slow about eating. Heartburn well controlled on once daily omeprazole.  The patient's Past Medical, Family and Social History were reviewed and are on file in the EMR.  Objective:  Med list reviewed  Current Outpatient Medications:  .  alum hydroxide-mag trisilicate (GAVISCON) 00-34 MG CHEW chewable tablet, Chew 2 tablets by mouth 3 (three) times daily as needed for indigestion or heartburn., Disp: , Rfl:  .  aspirin EC 81 MG EC tablet, Take 1 tablet (81 mg total) by mouth daily., Disp: , Rfl:  .  Cholecalciferol (VITAMIN D3) 250 MCG (10000 UT) TABS, Take 1 tablet by mouth 2 (two) times daily., Disp: , Rfl:  .  Cyanocobalamin (VITAMIN B-12) 1000 MCG SUBL, Place under  the tongue., Disp: , Rfl:  .  finasteride (PROSCAR) 5 MG tablet, Take 1 tablet (5 mg total) by mouth daily., Disp: 90 tablet, Rfl: 3 .  metFORMIN (GLUCOPHAGE-XR) 500 MG 24 hr tablet, Take 1 tablet (500 mg total) by mouth 2 (two) times daily., Disp: 180 tablet, Rfl: 3 .  Multiple Vitamin (MULTIVITAMIN) tablet, Take 1 tablet by mouth daily., Disp: , Rfl:  .  omeprazole (PRILOSEC) 20 MG capsule, Take 20 mg by mouth as needed., Disp: , Rfl:  .  Potassium Citrate 15 MEQ (1620 MG) TBCR, Take 1 tablet by mouth in the morning and at bedtime., Disp: , Rfl:  .  rosuvastatin (CRESTOR) 10 MG tablet, Take 10 mg by mouth daily., Disp: , Rfl:  .  sacubitril-valsartan (ENTRESTO) 24-26 MG, Take 1 tablet by mouth 2 (two) times daily., Disp: 60 tablet, Rfl: 3 .  spironolactone (ALDACTONE) 25 MG tablet, Take 1 tablet (25 mg total) by mouth daily., Disp: 30 tablet, Rfl: 3 .  timolol (TIMOPTIC) 0.5 % ophthalmic solution, Place 1 drop into both eyes 2 (two) times daily. , Disp: , Rfl:  .  PEG-KCl-NaCl-NaSulf-Na Asc-C (PLENVU) 140 g SOLR, Take 140 g by mouth as directed. Manufacturer's coupon Universal coupon code:BIN: P2366821; GROUP: [id number]; PCN: [id number]; ID: [id number]; PAY NO MORE $50, Disp: 1 each, Rfl: 0   Vital signs in last 24 hrs: Vitals:   04/14/20 1406  BP: 128/72  Pulse: 92  SpO2: 97%    Physical Exam  Well-appearing, normal vocal  quality  HEENT: sclera anicteric, oral mucosa moist without lesions  Neck: supple, no thyromegaly, JVD or lymphadenopathy  Cardiac: RRR without murmurs, S1S2 heard, no peripheral edema  Pulm: clear to auscultation bilaterally, normal RR and effort noted  Abdomen: soft, no tenderness, with active bowel sounds. No guarding or palpable hepatosplenomegaly.  Skin; warm and dry, no jaundice or rash  Labs:  CBC Latest Ref Rng & Units 03/17/2020 01/16/2020 12/16/2019  WBC 4.0 - 10.5 K/uL 5.8 6.3 8.0  Hemoglobin 13.0 - 17.0 g/dL 12.0(L) 10.4(L) 11.4(L)  Hematocrit 39 - 52 %  37.5(L) 32.4(L) 34.9(L)  Platelets 150 - 400 K/uL 377.0 439.0(H) 775.0(H)    ___________________________________________ Radiologic studies:   ____________________________________________ Other:   _____________________________________________ Assessment & Plan  Assessment: Encounter Diagnoses  Name Primary?  . Iron deficiency anemia, unspecified iron deficiency anemia type Yes  . Hiatal hernia     Anemia has improved, but he should have a colonoscopy to rule out a lower GI source of occult blood loss.  It may just be poor iron absorption, or intermittent gastric erosions within the hernia sac.  He wants to hold off on considerations of surgical management for the hernia, and that seems wise given the symptoms are fairly well controlled recently with medicine and caution with eating.  He would certainly be at increased cardiac risk for surgery given his age and relatively recent coronary bypass surgery.  He was agreeable to colonoscopy after discussion of procedure and risks.  The benefits and risks of the planned procedure were described in detail with the patient or (when appropriate) their health care proxy.  Risks were outlined as including, but not limited to, bleeding, infection, perforation, adverse medication reaction leading to cardiac or pulmonary decompensation, pancreatitis (if ERCP).  The limitation of incomplete mucosal visualization was also discussed.  No guarantees or warranties were given.  Patient at increased risk for cardiopulmonary complications of procedure due to medical comorbidities.  That said, he did well with the upper endoscopy in April and, now that he has had coronary bypass, I expect he will do well with a colonoscopy also.   30 minutes were spent on this encounter (including chart review, history/exam, counseling/coordination of care, and documentation)  James Braun

## 2020-04-14 NOTE — Progress Notes (Signed)
Cardiac Individual Treatment Plan  Patient Details  Name: James Braun MRN: [id number] Date of Birth: [birth year] Referring Provider:     CARDIAC REHAB PHASE II ORIENTATION from 02/17/2020 in [place]  Referring Provider Quay Burow, MD      Initial Encounter Date:    CARDIAC REHAB PHASE II ORIENTATION from 02/17/2020 in [place]  Date 02/17/20      Visit Diagnosis: 11/28/19 CABG x3  Patient's Home Medications on Admission:  Current Outpatient Medications:  .  alum hydroxide-mag trisilicate (GAVISCON) 68-03 MG CHEW chewable tablet, Chew 2 tablets by mouth 3 (three) times daily as needed for indigestion or heartburn., Disp: , Rfl:  .  aspirin EC 81 MG EC tablet, Take 1 tablet (81 mg total) by mouth daily., Disp: , Rfl:  .  Cholecalciferol (VITAMIN D3) 250 MCG (10000 UT) TABS, Take 1 tablet by mouth 2 (two) times daily., Disp: , Rfl:  .  Cyanocobalamin (VITAMIN B-12) 1000 MCG SUBL, Place under the tongue., Disp: , Rfl:  .  finasteride (PROSCAR) 5 MG tablet, Take 1 tablet (5 mg total) by mouth daily., Disp: 90 tablet, Rfl: 3 .  metFORMIN (GLUCOPHAGE-XR) 500 MG 24 hr tablet, Take 1 tablet (500 mg total) by mouth 2 (two) times daily., Disp: 180 tablet, Rfl: 3 .  Multiple Vitamin (MULTIVITAMIN) tablet, Take 1 tablet by mouth daily., Disp: , Rfl:  .  omeprazole (PRILOSEC) 20 MG capsule, Take 20 mg by mouth as needed., Disp: , Rfl:  .  PEG-KCl-NaCl-NaSulf-Na Asc-C (PLENVU) 140 g SOLR, Take 140 g by mouth as directed. Manufacturer's coupon Universal coupon code:BIN: P2366821; GROUP: [id number]; PCN: [id number]; ID: [id number]; PAY NO MORE $50, Disp: 1 each, Rfl: 0 .  Potassium Citrate 15 MEQ (1620 MG) TBCR, Take 1 tablet by mouth in the morning and at bedtime., Disp: , Rfl:  .  rosuvastatin (CRESTOR) 10 MG tablet, Take 10 mg by mouth daily., Disp: , Rfl:  .  sacubitril-valsartan (ENTRESTO) 24-26 MG, Take 1 tablet by mouth 2 (two)  times daily., Disp: 60 tablet, Rfl: 3 .  spironolactone (ALDACTONE) 25 MG tablet, Take 1 tablet (25 mg total) by mouth daily., Disp: 30 tablet, Rfl: 3 .  timolol (TIMOPTIC) 0.5 % ophthalmic solution, Place 1 drop into both eyes 2 (two) times daily. , Disp: , Rfl:   Past Medical History: Past Medical History:  Diagnosis Date  . BPH (benign prostatic hypertrophy)   . Coronary artery disease   . History of kidney stones   . History of melanoma excision OCT 2010  . Hyperlipidemia   . Hypertension   . Left ureteral calculus   . Nephrolithiasis    bilateral  . Type 2 diabetes mellitus ([place])     Tobacco Use: Social History   Tobacco Use  Smoking Status Never Smoker  Smokeless Tobacco Never Used    Labs: Recent Review Flowsheet Data    Labs for ITP Cardiac and Pulmonary Rehab Latest Ref Rng & Units 12/06/2019 12/07/2019 12/08/2019 01/16/2020 03/17/2020   Cholestrol 0 - 200 mg/dL - - - 188 181   LDLCALC 0 - 99 mg/dL - - - 125(H) 112(H)   LDLDIRECT mg/dL - - - - -   HDL >39.00 mg/dL - - - 33.20(L) 43.10   Trlycerides 0 - 149 mg/dL - - - 146.0 130.0   Hemoglobin A1c 4.8 - 5.6 % - - - - -   PHART 7.35 - 7.45 - - - - -  PCO2ART 32 - 48 mmHg - - - - -   HCO3 20.0 - 28.0 mmol/L - - - - -   TCO2 22 - 32 mmol/L - - - - -   ACIDBASEDEF 0.0 - 2.0 mmol/L - - - - -   O2SAT % 53.6 56.0 45.6 - -      Capillary Blood Glucose: Lab Results  Component Value Date   GLUCAP 160 (H) 02/27/2020   GLUCAP 247 (H) 02/27/2020   GLUCAP 133 (H) 02/23/2020   GLUCAP 194 (H) 02/23/2020   GLUCAP 160 (H) 02/17/2020     Exercise Target Goals: Exercise Program Goal: Individual exercise prescription set using results from initial 6 min walk test and THRR while considering  patient's activity barriers and safety.   Exercise Prescription Goal: Starting with aerobic activity 30 plus minutes a day, 3 days per week for initial exercise prescription. Provide home exercise prescription and guidelines that  participant acknowledges understanding prior to discharge.  Activity Barriers & Risk Stratification:  Activity Barriers & Cardiac Risk Stratification - 02/17/20 1429      Activity Barriers & Cardiac Risk Stratification   Activity Barriers History of Falls;Other (comment)    Comments Dizziness    Cardiac Risk Stratification High           6 Minute Walk:  6 Minute Walk    Row Name 02/17/20 1456 04/09/20 1549       6 Minute Walk   Phase Initial Discharge    Distance 1466 feet 1600 feet    Distance % Change -- 9.14 %    Distance Feet Change -- 134 ft    Walk Time 6 minutes 6 minutes    # of Rest Breaks 0 0    MPH 2.78 3    METS 2.88 2.95    RPE 11 13    Perceived Dyspnea  0 0    VO2 Peak 10.08 10.3    Symptoms No No    Resting HR 91 bpm 75 bpm    Resting BP 110/80 142/78    Resting Oxygen Saturation  97 % --    Exercise Oxygen Saturation  during 6 min walk 98 % --    Max Ex. HR 104 bpm 97 bpm    Max Ex. BP 130/72 130/80    2 Minute Post BP 122/80 120/60           Oxygen Initial Assessment:   Oxygen Re-Evaluation:   Oxygen Discharge (Final Oxygen Re-Evaluation):   Initial Exercise Prescription:  Initial Exercise Prescription - 02/17/20 1500      Date of Initial Exercise RX and Referring Provider   Date 02/17/20    Referring Provider Quay Burow, MD    Expected Discharge Date 04/16/20      NuStep   Level 2    SPM 85    Minutes 15    METs 2.5      Track   Laps 16    Minutes 15    METs 2.86      Prescription Details   Frequency (times per week) 3    Duration Progress to 30 minutes of continuous aerobic without signs/symptoms of physical distress      Intensity   THRR 40-80% of Max Heartrate 56-113    Ratings of Perceived Exertion 11-13    Perceived Dyspnea 0-4      Progression   Progression Continue to progress workloads to maintain intensity without signs/symptoms of physical distress.  Resistance Training   Training Prescription  Yes    Weight 3lbs    Reps 10-15           Perform Capillary Blood Glucose checks as needed.  Exercise Prescription Changes:   Exercise Prescription Changes    Row Name 02/23/20 1200 03/10/20 1300 03/17/20 1541 03/24/20 1410 04/05/20 0730     Response to Exercise   Blood Pressure (Admit) 112/64 130/70 120/70 122/74 130/80   Blood Pressure (Exercise) 124/70 118/64 122/66 128/72 118/70   Blood Pressure (Exit) 114/68 108/68 118/72 110/64 112/64   Heart Rate (Admit) 84 bpm 82 bpm 79 bpm 83 bpm 90 bpm   Heart Rate (Exercise) 103 bpm 106 bpm 103 bpm 113 bpm 97 bpm   Heart Rate (Exit) 82 bpm 88 bpm 79 bpm 82 bpm 80 bpm   Rating of Perceived Exertion (Exercise) 13 12 12 12 12    Perceived Dyspnea (Exercise) 0 0 0 0 0   Symptoms None None None None None   Comments Pt's first day of exercise Reviewed Home Exercise Plan None None None   Duration Progress to 30 minutes of  aerobic without signs/symptoms of physical distress Continue with 30 min of aerobic exercise without signs/symptoms of physical distress. Continue with 30 min of aerobic exercise without signs/symptoms of physical distress. Continue with 30 min of aerobic exercise without signs/symptoms of physical distress. Continue with 30 min of aerobic exercise without signs/symptoms of physical distress.   Intensity THRR unchanged THRR unchanged THRR unchanged THRR unchanged THRR unchanged     Progression   Progression Continue to progress workloads to maintain intensity without signs/symptoms of physical distress. Continue to progress workloads to maintain intensity without signs/symptoms of physical distress. Continue to progress workloads to maintain intensity without signs/symptoms of physical distress. Continue to progress workloads to maintain intensity without signs/symptoms of physical distress. Continue to progress workloads to maintain intensity without signs/symptoms of physical distress.   Average METs 2.6 2.6 2.63 2.7 2.6      Resistance Training   Training Prescription Yes No No No Yes   Weight 3lbs -- -- -- 3lbs   Reps 10-15 -- -- -- 10-15   Time 10 Minutes -- -- -- 10 Minutes     Interval Training   Interval Training No -- -- -- No     NuStep   Level 2 2 2 2 3    SPM 80 80 85 85 85   Minutes 15 15 15 15 15    METs 2.3 2.2 2.4 2.4 2.3     Track   Laps 15 16 16 17 16    Minutes 15 15 15 15 15    METs 2.6 2.7 2.86 2.86 2.86     Home Exercise Plan   Plans to continue exercise at -- [employer] (comment)  [place] (comment)  [place] (comment)  [place] (comment)  [facility] [employer] [facility]   Frequency -- Add 2 additional days to program exercise sessions. Add 2 additional days to program exercise sessions. Add 3 additional days to program exercise sessions. Add 3 additional days to program exercise sessions.   Initial Home Exercises Provided -- 03/10/20 03/10/20 03/10/20 03/10/20          Exercise Comments:   Exercise Comments    Row Name 02/23/20 1221 03/10/20 1348 03/25/20 1412 04/15/20 0727     Exercise Comments Pt's first day of exercise. Pt responded well to exercise  prescription. Will continue to progress workloads as tolerated and monitor. Reviewed Home Exercise Plan with pt. Pt has joined [employer] with his wife. They will be exercising 2-3 days a week at [facility]. Will continue to monitor and progress pt. Reviewed METs with pt. Pt is continuing to respond well to exercise prescription. Pt has started going to [facility] with wife for exercise. Pt is doing well in program. Will continue to monitor. Pt is responding well to exercise pescription. Pt is set to graduate from cardiac rehab 04/16/20. Pt is motivated to continue to exercise most days of the week.           Exercise Goals and Review:   Exercise Goals    Row Name 02/17/20 1421             Exercise Goals   Increase Physical Activity Yes        Intervention Provide advice, education, support and counseling about physical activity/exercise needs.;Develop an individualized exercise prescription for aerobic and resistive training based on initial evaluation findings, risk stratification, comorbidities and participant's personal goals.       Expected Outcomes Short Term: Attend rehab on a regular basis to increase amount of physical activity.;Long Term: Exercising regularly at least 3-5 days a week.;Long Term: Add in home exercise to make exercise part of routine and to increase amount of physical activity.       Increase Strength and Stamina Yes       Intervention Provide advice, education, support and counseling about physical activity/exercise needs.;Develop an individualized exercise prescription for aerobic and resistive training based on initial evaluation findings, risk stratification, comorbidities and participant's personal goals.       Expected Outcomes Short Term: Increase workloads from initial exercise prescription for resistance, speed, and METs.;Short Term: Perform resistance training exercises routinely during rehab and add in resistance training at home;Long Term: Improve cardiorespiratory fitness, muscular endurance and strength as measured by increased METs and functional capacity (6MWT)       Able to understand and use rate of perceived exertion (RPE) scale Yes       Intervention Provide education and explanation on how to use RPE scale       Expected Outcomes Short Term: Able to use RPE daily in rehab to express subjective intensity level;Long Term:  Able to use RPE to guide intensity level when exercising independently       Knowledge and understanding of Target Heart Rate Range (THRR) Yes       Intervention Provide education and explanation of THRR including how the numbers were predicted and where they are located for reference       Expected Outcomes Short Term: Able to state/look up THRR;Long Term: Able to use THRR to  govern intensity when exercising independently;Short Term: Able to use daily as guideline for intensity in rehab       Able to check pulse independently Yes       Intervention Provide education and demonstration on how to check pulse in carotid and radial arteries.;Review the importance of being able to check your own pulse for safety during independent exercise       Expected Outcomes Short Term: Able to explain why pulse checking is important during independent exercise;Long Term: Able to check pulse independently and accurately       Understanding of Exercise Prescription Yes       Intervention Provide education, explanation, and written materials on patient's individual exercise prescription  Expected Outcomes Short Term: Able to explain program exercise prescription;Long Term: Able to explain home exercise prescription to exercise independently              Exercise Goals Re-Evaluation :  Exercise Goals Re-Evaluation    Row Name 02/23/20 1222 03/10/20 1351 03/25/20 1414 04/15/20 0728       Exercise Goal Re-Evaluation   Exercise Goals Review Able to understand and use rate of perceived exertion (RPE) scale;Knowledge and understanding of Target Heart Rate Range (THRR);Understanding of Exercise Prescription Able to understand and use rate of perceived exertion (RPE) scale;Knowledge and understanding of Target Heart Rate Range (THRR);Understanding of Exercise Prescription;Increase Strength and Stamina;Increase Physical Activity;Able to check pulse independently Able to understand and use rate of perceived exertion (RPE) scale;Knowledge and understanding of Target Heart Rate Range (THRR);Understanding of Exercise Prescription;Increase Strength and Stamina;Increase Physical Activity;Able to check pulse independently Able to understand and use rate of perceived exertion (RPE) scale;Knowledge and understanding of Target Heart Rate Range (THRR);Understanding of Exercise Prescription;Increase  Strength and Stamina;Increase Physical Activity;Able to check pulse independently    Comments Pt's first day of exercise. Pt able to exercise 30 minutes with minimal difficulty. Will continue to monitor. Reviewed HEP with pt. Discussed THRR, RPE Scale, weather precautions, NTG use, endpoints of exercise, warmup and cool down. Pt is responding well to exercise while in cardiac rehab. Will follow up with pt to see if they are willing to try level 3 on Nustep. Pt is getting stronger and ready for a new level. Pt is doing well with exercise. Able to exercise 30 minutes with no difficulty.    Expected Outcomes Pt will continue to increase cardiovascular fitness. Pt will exercise with pt at [facility], 2-3 days a week 30-40 minutes. Pt will continue to work to increase stamina. Pt will continue to exercise most days of the week and increase cardiovascular strength. Pt is continuing to walk 3-4 days a week for 30 minutes, 2x for 15 minutes.            Discharge Exercise Prescription (Final Exercise Prescription Changes):  Exercise Prescription Changes - 04/05/20 0730      Response to Exercise   Blood Pressure (Admit) 130/80    Blood Pressure (Exercise) 118/70    Blood Pressure (Exit) 112/64    Heart Rate (Admit) 90 bpm    Heart Rate (Exercise) 97 bpm    Heart Rate (Exit) 80 bpm    Rating of Perceived Exertion (Exercise) 12    Perceived Dyspnea (Exercise) 0    Symptoms None    Comments None    Duration Continue with 30 min of aerobic exercise without signs/symptoms of physical distress.    Intensity THRR unchanged      Progression   Progression Continue to progress workloads to maintain intensity without signs/symptoms of physical distress.    Average METs 2.6      Resistance Training   Training Prescription Yes    Weight 3lbs    Reps 10-15    Time 10 Minutes      Interval Training   Interval Training No      NuStep   Level 3    SPM 85    Minutes 15    METs 2.3      Track   Laps  16    Minutes 15    METs 2.86      Home Exercise Plan   Plans to continue exercise at [employer] (comment)   [facility] [employer] &  [facility]alking   Frequency Add 3 additional days to program exercise sessions.    Initial Home Exercises Provided 03/10/20           Nutrition:  Target Goals: Understanding of nutrition guidelines, daily intake of sodium 1500mg , cholesterol 200mg , calories 30% from fat and 7% or less from saturated fats, daily to have 5 or more servings of fruits and vegetables.  Biometrics:  Pre Biometrics - 02/17/20 1429      Pre Biometrics   Waist Circumference 38.75 inches    Hip Circumference 37 inches    Waist to Hip Ratio 1.05 %    Triceps Skinfold 6 mm    % Body Fat 22.8 %    Grip Strength 32.5 kg    Flexibility 10.75 in    Single Leg Stand 22.25 seconds           Post Biometrics - 04/09/20 1549       Post  Biometrics   Height 5\' 6"  (1.676 m)    Weight 73.5 kg    Waist Circumference 39.5 inches    Hip Circumference 39 inches    Waist to Hip Ratio 1.01 %    BMI (Calculated) 26.17    Triceps Skinfold 7 mm    % Body Fat 24.3 %    Grip Strength 39 kg    Flexibility 11 in    Single Leg Stand 3.7 seconds           Nutrition Therapy Plan and Nutrition Goals:  Nutrition Therapy & Goals - 03/25/20 0737      Nutrition Therapy   Diet Heart Healthy      Personal Nutrition Goals   Nutrition Goal Pt to build a healthy plate including vegetables, fruits, whole grains, and low-fat dairy products in a heart healthy meal plan.    Personal Goal #2 Pt to make good choices when eating out at restaurants    Personal Goal #3 CBG concentrations in the normal range or as close to normal as is safely possible.      Intervention Plan   Intervention Prescribe, educate and counsel regarding individualized specific dietary modifications aiming towards targeted core components such as weight, hypertension, lipid management, diabetes, heart failure and other  comorbidities.    Expected Outcomes Short Term Goal: Understand basic principles of dietary content, such as calories, fat, sodium, cholesterol and nutrients.           Nutrition Assessments:  Nutrition Assessments - 03/18/20 1114      MEDFICTS Scores   Pre Score 54           Nutrition Goals Re-Evaluation:  Nutrition Goals Re-Evaluation    [place] Name 03/25/20 0738 04/13/20 0859           Goals   Current Weight 159 lb (72.1 kg) 157 lb 13.6 oz (71.6 kg)      Nutrition Goal Pt to build a healthy plate including vegetables, fruits, whole grains, and low-fat dairy products in a heart healthy meal plan. Pt to build a healthy plate including vegetables, fruits, whole grains, and low-fat dairy products in a heart healthy meal plan.        Personal Goal #2 Re-Evaluation   Personal Goal #2 Pt to make good choices when eating out at restaurants Pt to make good choices when eating out at restaurants        Personal Goal #3 Re-Evaluation   Personal Goal #3 CBG concentrations in the normal range or as close to normal  as is safely possible. CBG concentrations in the normal range or as close to normal as is safely possible.             Nutrition Goals Discharge (Final Nutrition Goals Re-Evaluation):  Nutrition Goals Re-Evaluation - 04/13/20 0859      Goals   Current Weight 157 lb 13.6 oz (71.6 kg)    Nutrition Goal Pt to build a healthy plate including vegetables, fruits, whole grains, and low-fat dairy products in a heart healthy meal plan.      Personal Goal #2 Re-Evaluation   Personal Goal #2 Pt to make good choices when eating out at restaurants      Personal Goal #3 Re-Evaluation   Personal Goal #3 CBG concentrations in the normal range or as close to normal as is safely possible.           Psychosocial: Target Goals: Acknowledge presence or absence of significant depression and/or stress, maximize coping skills, provide positive support system. Participant is able to  verbalize types and ability to use techniques and skills needed for reducing stress and depression.  Initial Review & Psychosocial Screening:  Initial Psych Review & Screening - 02/17/20 1636      Initial Review   Current issues with Current Stress Concerns    Source of Stress Concerns Chronic Illness    Comments James Braun says he is concerned about his health as he has had some dizziness after surgery      Family Dynamics   Good Support System? Yes   James Braun has his wife for support     Barriers   Psychosocial barriers to participate in program There are no identifiable barriers or psychosocial needs.      Screening Interventions   Interventions Encouraged to exercise    Expected Outcomes Long Term Goal: Stressors or current issues are controlled or eliminated.           Quality of Life Scores:  Quality of Life - 02/17/20 1557      Quality of Life   Select Quality of Life      Quality of Life Scores   Health/Function Pre 25.2 %    Socioeconomic Pre 28.29 %    Psych/Spiritual Pre 28.29 %    Family Pre 27.6 %    GLOBAL Pre 26.82 %          Scores of 19 and below usually indicate a poorer quality of life in these areas.  A difference of  2-3 points is a clinically meaningful difference.  A difference of 2-3 points in the total score of the Quality of Life Index has been associated with significant improvement in overall quality of life, self-image, physical symptoms, and general health in studies assessing change in quality of life.  PHQ-9: Recent Review Flowsheet Data    Depression screen [facility] 2/9 03/24/2020 02/17/2020 03/12/2019 03/06/2018 03/01/2017   Decreased Interest 0 0 0 0 0   Down, Depressed, Hopeless 0 0 0 0 0   PHQ - 2 Score 0 0 0 0 0     Interpretation of Total Score  Total Score Depression Severity:  1-4 = Minimal depression, 5-9 = Mild depression, 10-14 = Moderate depression, 15-19 = Moderately severe depression, 20-27 = Severe depression   Psychosocial Evaluation and  Intervention:   Psychosocial Re-Evaluation:  Psychosocial Re-Evaluation    [place] Name 03/25/20 1636 04/14/20 1621           Psychosocial Re-Evaluation   Current issues with None Identified None  Identified      Comments James Braun has not voiced any further stressors or concerns since he has been participating in phase 2 cardiac rehab James Braun has not voiced any further stressors or concerns since he has been participating in phase 2 cardiac rehab      Expected Outcomes will continue to monitor and offer support as needed will continue to monitor and offer support as needed      Interventions Encouraged to attend Cardiac Rehabilitation for the exercise Encouraged to attend Cardiac Rehabilitation for the exercise      Continue Psychosocial Services  No Follow up required No Follow up required             Psychosocial Discharge (Final Psychosocial Re-Evaluation):  Psychosocial Re-Evaluation - 04/14/20 1621      Psychosocial Re-Evaluation   Current issues with None Identified    Comments James Braun has not voiced any further stressors or concerns since he has been participating in phase 2 cardiac rehab    Expected Outcomes will continue to monitor and offer support as needed    Interventions Encouraged to attend Cardiac Rehabilitation for the exercise    Continue Psychosocial Services  No Follow up required           Vocational Rehabilitation: Provide vocational rehab assistance to qualifying candidates.   Vocational Rehab Evaluation & Intervention:  Vocational Rehab - 02/17/20 1639      Initial Vocational Rehab Evaluation & Intervention   Assessment shows need for Vocational Rehabilitation No   James Braun is retired and does not need vocational rehab at this time          Education: Education Goals: Education classes will be provided on a weekly basis, covering required topics. Participant will state understanding/return demonstration of topics presented.  Learning Barriers/Preferences:   Learning Barriers/Preferences - 02/17/20 1638      Learning Barriers/Preferences   Learning Barriers Sight   wears reading glasses   Learning Preferences None           Education Topics: Hypertension, Hypertension Reduction -Define heart disease and high blood pressure. Discus how high blood pressure affects the body and ways to reduce high blood pressure.   Exercise and Your Heart -Discuss why it is important to exercise, the FITT principles of exercise, normal and abnormal responses to exercise, and how to exercise safely.   Angina -Discuss definition of angina, causes of angina, treatment of angina, and how to decrease risk of having angina.   Cardiac Medications -Review what the following cardiac medications are used for, how they affect the body, and side effects that may occur when taking the medications.  Medications include Aspirin, Beta blockers, calcium channel blockers, ACE Inhibitors, angiotensin receptor blockers, diuretics, digoxin, and antihyperlipidemics.   Congestive Heart Failure -Discuss the definition of CHF, how to live with CHF, the signs and symptoms of CHF, and how keep track of weight and sodium intake.   Heart Disease and Intimacy -Discus the effect sexual activity has on the heart, how changes occur during intimacy as we age, and safety during sexual activity.   Smoking Cessation / COPD -Discuss different methods to quit smoking, the health benefits of quitting smoking, and the definition of COPD.   Nutrition I: Fats -Discuss the types of cholesterol, what cholesterol does to the heart, and how cholesterol levels can be controlled.   Nutrition II: Labels -Discuss the different components of food labels and how to read food label   Heart Parts/Heart Disease and PAD -Discuss the  anatomy of the heart, the pathway of blood circulation through the heart, and these are affected by heart disease.   Stress I: Signs and Symptoms -Discuss the  causes of stress, how stress may lead to anxiety and depression, and ways to limit stress.   Stress II: Relaxation -Discuss different types of relaxation techniques to limit stress.   Warning Signs of Stroke / TIA -Discuss definition of a stroke, what the signs and symptoms are of a stroke, and how to identify when someone is having stroke.   Knowledge Questionnaire Score:  Knowledge Questionnaire Score - 02/17/20 1558      Knowledge Questionnaire Score   Pre Score 21/24           Core Components/Risk Factors/Patient Goals at Admission:  Personal Goals and Risk Factors at Admission - 02/17/20 1640      Core Components/Risk Factors/Patient Goals on Admission    Weight Management Yes;Weight Maintenance    Intervention Weight Management: Develop a combined nutrition and exercise program designed to reach desired caloric intake, while maintaining appropriate intake of nutrient and fiber, sodium and fats, and appropriate energy expenditure required for the weight goal.;Weight Management: Provide education and appropriate resources to help participant work on and attain dietary goals.;Weight Management/Obesity: Establish reasonable short term and long term weight goals.    Expected Outcomes Short Term: Continue to assess and modify interventions until short term weight is achieved;Long Term: Adherence to nutrition and physical activity/exercise program aimed toward attainment of established weight goal;Weight Maintenance: Understanding of the daily nutrition guidelines, which includes 25-35% calories from fat, 7% or less cal from saturated fats, less than 200mg  cholesterol, less than 1.5gm of sodium, & 5 or more servings of fruits and vegetables daily;Understanding of distribution of calorie intake throughout the day with the consumption of 4-5 meals/snacks;Understanding recommendations for meals to include 15-35% energy as protein, 25-35% energy from fat, 35-60% energy from carbohydrates, less  than 200mg  of dietary cholesterol, 20-35 gm of total fiber daily    Diabetes Yes    Intervention Provide education about signs/symptoms and action to take for hypo/hyperglycemia.;Provide education about proper nutrition, including hydration, and aerobic/resistive exercise prescription along with prescribed medications to achieve blood glucose in normal ranges: Fasting glucose 65-99 mg/dL    Expected Outcomes Short Term: Participant verbalizes understanding of the signs/symptoms and immediate care of hyper/hypoglycemia, proper foot care and importance of medication, aerobic/resistive exercise and nutrition plan for blood glucose control.;Long Term: Attainment of HbA1C < 7%.    Heart Failure Yes    Intervention Provide a combined exercise and nutrition program that is supplemented with education, support and counseling about heart failure. Directed toward relieving symptoms such as shortness of breath, decreased exercise tolerance, and extremity edema.    Expected Outcomes Improve functional capacity of life;Short term: Attendance in program 2-3 days a week with increased exercise capacity. Reported lower sodium intake. Reported increased fruit and vegetable intake. Reports medication compliance.;Short term: Daily weights obtained and reported for increase. Utilizing diuretic protocols set by physician.;Long term: Adoption of self-care skills and reduction of barriers for early signs and symptoms recognition and intervention leading to self-care maintenance.    Hypertension Yes    Intervention Provide education on lifestyle modifcations including regular physical activity/exercise, weight management, moderate sodium restriction and increased consumption of fresh fruit, vegetables, and low fat dairy, alcohol moderation, and smoking cessation.;Monitor prescription use compliance.    Expected Outcomes Short Term: Continued assessment and intervention until BP is < 140/42mm HG in hypertensive participants. <  130/41mm HG in hypertensive participants with diabetes, heart failure or chronic kidney disease.;Long Term: Maintenance of blood pressure at goal levels.    Lipids Yes    Intervention Provide education and support for participant on nutrition & aerobic/resistive exercise along with prescribed medications to achieve LDL 70mg , HDL >40mg .    Expected Outcomes Short Term: Participant states understanding of desired cholesterol values and is compliant with medications prescribed. Participant is following exercise prescription and nutrition guidelines.;Long Term: Cholesterol controlled with medications as prescribed, with individualized exercise RX and with personalized nutrition plan. Value goals: LDL < 70mg , HDL > 40 mg.    Stress Yes    Intervention Offer individual and/or small group education and counseling on adjustment to heart disease, stress management and health-related lifestyle change. Teach and support self-help strategies.    Expected Outcomes Short Term: Participant demonstrates changes in health-related behavior, relaxation and other stress management skills, ability to obtain effective social support, and compliance with psychotropic medications if prescribed.;Long Term: Emotional wellbeing is indicated by absence of clinically significant psychosocial distress or social isolation.           Core Components/Risk Factors/Patient Goals Review:   Goals and Risk Factor Review    Row Name 02/23/20 1435 02/23/20 1436 03/25/20 1637 04/14/20 1622       Core Components/Risk Factors/Patient Goals Review   Personal Goals Review Weight Management/Obesity;Lipids;Stress;Diabetes;Heart Failure;Hypertension Weight Management/Obesity;Lipids;Stress;Diabetes;Heart Failure;Hypertension Weight Management/Obesity;Lipids;Stress;Diabetes;Heart Failure;Hypertension Weight Management/Obesity;Lipids;Stress;Diabetes;Heart Failure;Hypertension    Review -- James Braun started exercise on 02/23/20 and did well with exercise.  James Braun has been doing well with exercise. James Braun's vital sign and CBG's have been stable. James Braun has been doing well with exercise. James Braun's vital sign and CBG's have been stable. James Braun completes exercise on 04/14/20    Expected Outcomes -- James Braun will continue to particiapte in phase2 cardiac rehab for exercise, nutrition and lifestyle modifications James Braun will continue to particiapte in phase2 cardiac rehab for exercise, nutrition and lifestyle modifications James Braun will continue to exercise upon completion of phase 2 cardiac rehab. Follow nutrtion and lifestyle modifications           Core Components/Risk Factors/Patient Goals at Discharge (Final Review):   Goals and Risk Factor Review - 04/14/20 1622      Core Components/Risk Factors/Patient Goals Review   Personal Goals Review Weight Management/Obesity;Lipids;Stress;Diabetes;Heart Failure;Hypertension    Review James Braun has been doing well with exercise. James Braun's vital sign and CBG's have been stable. James Braun completes exercise on 04/14/20    Expected Outcomes James Braun will continue to exercise upon completion of phase 2 cardiac rehab. Follow nutrtion and lifestyle modifications           ITP Comments:  ITP Comments    Row Name 02/17/20 1416 02/26/20 1610 03/25/20 1635 04/14/20 1559     ITP Comments Medical Director- Dr. Fransico Him, MD 30 Day ITP Review. James Braun started exercise at cardiac rehab on 02/23/20 and did well with exercise on his first day. 30 day ITP Review. James Braun has good attendance and participation in phase 2 cardiac rehab. 30 day ITP Review. James Braun has good attendance and participation in phase 2 cardiac rehab. James Braun completes cardiac rehab on 04/16/20           Comments: See ITP comments.Barnet Pall, RN,BSN 04/15/2020 11:15 AM

## 2020-04-16 ENCOUNTER — Other Ambulatory Visit: Payer: Self-pay

## 2020-04-16 ENCOUNTER — Encounter (HOSPITAL_COMMUNITY)
Admission: RE | Admit: 2020-04-16 | Discharge: 2020-04-16 | Disposition: A | Discharge status: Home / Self Care | Payer: PPO | Source: Ambulatory Visit | Attending: Cardiovascular Disease | Admitting: Cardiovascular Disease

## 2020-04-16 DIAGNOSIS — Z951 Presence of aortocoronary bypass graft: Secondary | ICD-10-CM | POA: Diagnosis not present

## 2020-04-16 NOTE — Progress Notes (Signed)
Discharge Progress Report  Patient Details  Name: James Braun MRN: [id number] Date of Birth: 1941-04-29 Referring Provider:     [place] from 02/17/2020 in [place]  Referring Provider Quay Burow, MD       Number of Visits: 21  Reason for Discharge:  Patient reached a stable level of exercise. Patient independent in their exercise. Patient has met program and personal goals.  Smoking History:  Social History   Tobacco Use  Smoking Status Never Smoker  Smokeless Tobacco Never Used    Diagnosis:  11/28/19 CABG x3  ADL [facility]SD:   Initial Exercise Prescription:  Initial Exercise Prescription - 02/17/20 1500      Date of Initial Exercise RX and Referring Provider   Date 02/17/20    Referring Provider Quay Burow, MD    Expected Discharge Date 04/16/20      NuStep   Level 2    SPM 85    Minutes 15    METs 2.5      Track   Laps 16    Minutes 15    METs 2.86      Prescription Details   Frequency (times per week) 3    Duration Progress to 30 minutes of continuous aerobic without signs/symptoms of physical distress      Intensity   THRR 40-80% of Max Heartrate 56-113    Ratings of Perceived Exertion 11-13    Perceived Dyspnea 0-4      Progression   Progression Continue to progress workloads to maintain intensity without signs/symptoms of physical distress.      Resistance Training   Training Prescription Yes    Weight 3lbs    Reps 10-15           Discharge Exercise Prescription (Final Exercise Prescription Changes):  Exercise Prescription Changes - 04/16/20 1200      Response to Exercise   Blood Pressure (Admit) 122/80    Blood Pressure (Exercise) 124/70    Blood Pressure (Exit) 110/70    Heart Rate (Admit) 80 bpm    Heart Rate (Exercise) 94 bpm    Heart Rate (Exit) 78 bpm    Rating of Perceived Exertion (Exercise) 12    Perceived Dyspnea (Exercise) 0    Symptoms None     Comments Pt graduated from the [facility] program today.    Duration Continue with 30 min of aerobic exercise without signs/symptoms of physical distress.    Intensity THRR unchanged      Progression   Progression Continue to progress workloads to maintain intensity without signs/symptoms of physical distress.      Resistance Training   Training Prescription Yes    Weight 3lbs    Reps 10-15    Time 10 Minutes      Interval Training   Interval Training No      NuStep   Level 3    SPM 85    Minutes 15    METs 2.3      Track   Laps 16    Minutes 15    METs 2.86      Home Exercise Plan   Plans to continue exercise at [facility] (comment)    Frequency Add 3 additional days to program exercise sessions.    Initial Home Exercises Provided 03/10/20           Functional Capacity:  6 Minute Walk    Row Name 02/17/20 1456 04/09/20 1549  6 Minute Walk   Phase Initial Discharge    Distance 1466 feet 1600 feet    Distance % Change -- 9.14 %    Distance Feet Change -- 134 ft    Walk Time 6 minutes 6 minutes    # of Rest Breaks 0 0    MPH 2.78 3    METS 2.88 2.95    RPE 11 13    Perceived Dyspnea  0 0    VO2 Peak 10.08 10.3    Symptoms No No    Resting HR 91 bpm 75 bpm    Resting BP 110/80 142/78    Resting Oxygen Saturation  97 % --    Exercise Oxygen Saturation  during 6 min walk 98 % --    Max Ex. HR 104 bpm 97 bpm    Max Ex. BP 130/72 130/80    2 Minute Post BP 122/80 120/60           Psychological, QOL, Others - Outcomes: PHQ 2/9: Depression screen [facility] 2/9 04/16/2020 03/24/2020 02/17/2020 03/12/2019 03/06/2018  Decreased Interest 0 0 0 0 0  Down, Depressed, Hopeless 0 0 0 0 0  PHQ - 2 Score 0 0 0 0 0  Some recent data might be hidden    Quality of Life:  Quality of Life - 04/29/20 0900      Quality of Life Scores   Health/Function Pre 25.2 %    Health/Function Post 27.2 %    Health/Function % Change 7.94 %    Socioeconomic Pre 28.29 %     Socioeconomic Post 28.29 %    Socioeconomic % Change  0 %    Psych/Spiritual Pre 28.29 %    Psych/Spiritual Post 29.14 %    Psych/Spiritual % Change 3 %    Family Pre 27.6 %    Family Post 30 %    Family % Change 8.7 %    GLOBAL Pre 26.82 %    GLOBAL Post 28.24 %    GLOBAL % Change 5.29 %           Personal Goals: Goals established at orientation with interventions provided to work toward goal.  Personal Goals and Risk Factors at Admission - 02/17/20 1640      Core Components/Risk Factors/Patient Goals on Admission    Weight Management Yes;Weight Maintenance    Intervention Weight Management: Develop a combined nutrition and exercise program designed to reach desired caloric intake, while maintaining appropriate intake of nutrient and fiber, sodium and fats, and appropriate energy expenditure required for the weight goal.;Weight Management: Provide education and appropriate resources to help participant work on and attain dietary goals.;Weight Management/Obesity: Establish reasonable short term and long term weight goals.    Expected Outcomes Short Term: Continue to assess and modify interventions until short term weight is achieved;Long Term: Adherence to nutrition and physical activity/exercise program aimed toward attainment of established weight goal;Weight Maintenance: Understanding of the daily nutrition guidelines, which includes 25-35% calories from fat, 7% or less cal from saturated fats, less than 214m cholesterol, less than 1.5gm of sodium, & 5 or more servings of fruits and vegetables daily;Understanding of distribution of calorie intake throughout the day with the consumption of 4-5 meals/snacks;Understanding recommendations for meals to include 15-35% energy as protein, 25-35% energy from fat, 35-60% energy from carbohydrates, less than 2080mof dietary cholesterol, 20-35 gm of total fiber daily    Diabetes Yes    Intervention Provide education about signs/symptoms and action  to  take for hypo/hyperglycemia.;Provide education about proper nutrition, including hydration, and aerobic/resistive exercise prescription along with prescribed medications to achieve blood glucose in normal ranges: Fasting glucose 65-99 mg/dL    Expected Outcomes Short Term: Participant verbalizes understanding of the signs/symptoms and immediate care of hyper/hypoglycemia, proper foot care and importance of medication, aerobic/resistive exercise and nutrition plan for blood glucose control.;Long Term: Attainment of HbA1C < 7%.    Heart Failure Yes    Intervention Provide a combined exercise and nutrition program that is supplemented with education, support and counseling about heart failure. Directed toward relieving symptoms such as shortness of breath, decreased exercise tolerance, and extremity edema.    Expected Outcomes Improve functional capacity of life;Short term: Attendance in program 2-3 days a week with increased exercise capacity. Reported lower sodium intake. Reported increased fruit and vegetable intake. Reports medication compliance.;Short term: Daily weights obtained and reported for increase. Utilizing diuretic protocols set by physician.;Long term: Adoption of self-care skills and reduction of barriers for early signs and symptoms recognition and intervention leading to self-care maintenance.    Hypertension Yes    Intervention Provide education on lifestyle modifcations including regular physical activity/exercise, weight management, moderate sodium restriction and increased consumption of fresh fruit, vegetables, and low fat dairy, alcohol moderation, and smoking cessation.;Monitor prescription use compliance.    Expected Outcomes Short Term: Continued assessment and intervention until BP is < 140/42m HG in hypertensive participants. < 130/835mHG in hypertensive participants with diabetes, heart failure or chronic kidney disease.;Long Term: Maintenance of blood pressure at goal levels.     Lipids Yes    Intervention Provide education and support for participant on nutrition & aerobic/resistive exercise along with prescribed medications to achieve LDL <7041mHDL >56m29m  Expected Outcomes Short Term: Participant states understanding of desired cholesterol values and is compliant with medications prescribed. Participant is following exercise prescription and nutrition guidelines.;Long Term: Cholesterol controlled with medications as prescribed, with individualized exercise RX and with personalized nutrition plan. Value goals: LDL < 70mg48mL > 40 mg.    Stress Yes    Intervention Offer individual and/or small group education and counseling on adjustment to heart disease, stress management and health-related lifestyle change. Teach and support self-help strategies.    Expected Outcomes Short Term: Participant demonstrates changes in health-related behavior, relaxation and other stress management skills, ability to obtain effective social support, and compliance with psychotropic medications if prescribed.;Long Term: Emotional wellbeing is indicated by absence of clinically significant psychosocial distress or social isolation.            Personal Goals Discharge:  Goals and Risk Factor Review    Row Name 02/23/20 1435 02/23/20 1436 03/25/20 1637 04/14/20 1622       Core Components/Risk Factors/Patient Goals Review   Personal Goals Review Weight Management/Obesity;Lipids;Stress;Diabetes;Heart Failure;Hypertension Weight Management/Obesity;Lipids;Stress;Diabetes;Heart Failure;Hypertension Weight Management/Obesity;Lipids;Stress;Diabetes;Heart Failure;Hypertension Weight Management/Obesity;Lipids;Stress;Diabetes;Heart Failure;Hypertension    Review -- James Braun started exercise on 02/23/20 and did well with exercise. James Braun has been doing well with exercise. James Braun's vital sign and CBG's have been stable. James Braun has been doing well with exercise. James Braun's vital sign and CBG's have been stable. James Braun  completes exercise on 04/14/20    Expected Outcomes -- James Braun will continue to particiapte in phase2 cardiac rehab for exercise, nutrition and lifestyle modifications James Braun will continue to particiapte in phase2 cardiac rehab for exercise, nutrition and lifestyle modifications James Braun will continue to exercise upon completion of phase 2 cardiac rehab. Follow nutrtion and lifestyle modifications  Exercise Goals and Review:  Exercise Goals    Row Name 02/17/20 1421             Exercise Goals   Increase Physical Activity Yes       Intervention Provide advice, education, support and counseling about physical activity/exercise needs.;Develop an individualized exercise prescription for aerobic and resistive training based on initial evaluation findings, risk stratification, comorbidities and participant's personal goals.       Expected Outcomes Short Term: Attend rehab on a regular basis to increase amount of physical activity.;Long Term: Exercising regularly at least 3-5 days a week.;Long Term: Add in home exercise to make exercise part of routine and to increase amount of physical activity.       Increase Strength and Stamina Yes       Intervention Provide advice, education, support and counseling about physical activity/exercise needs.;Develop an individualized exercise prescription for aerobic and resistive training based on initial evaluation findings, risk stratification, comorbidities and participant's personal goals.       Expected Outcomes Short Term: Increase workloads from initial exercise prescription for resistance, speed, and METs.;Short Term: Perform resistance training exercises routinely during rehab and add in resistance training at home;Long Term: Improve cardiorespiratory fitness, muscular endurance and strength as measured by increased METs and functional capacity (6MWT)       Able to understand and use rate of perceived exertion (RPE) scale Yes       Intervention Provide  education and explanation on how to use RPE scale       Expected Outcomes Short Term: Able to use RPE daily in rehab to express subjective intensity level;Long Term:  Able to use RPE to guide intensity level when exercising independently       Knowledge and understanding of Target Heart Rate Range (THRR) Yes       Intervention Provide education and explanation of THRR including how the numbers were predicted and where they are located for reference       Expected Outcomes Short Term: Able to state/look up THRR;Long Term: Able to use THRR to govern intensity when exercising independently;Short Term: Able to use daily as guideline for intensity in rehab       Able to check pulse independently Yes       Intervention Provide education and demonstration on how to check pulse in carotid and radial arteries.;Review the importance of being able to check your own pulse for safety during independent exercise       Expected Outcomes Short Term: Able to explain why pulse checking is important during independent exercise;Long Term: Able to check pulse independently and accurately       Understanding of Exercise Prescription Yes       Intervention Provide education, explanation, and written materials on patient's individual exercise prescription       Expected Outcomes Short Term: Able to explain program exercise prescription;Long Term: Able to explain home exercise prescription to exercise independently              Exercise Goals Re-Evaluation:  Exercise Goals Re-Evaluation    Row Name 02/23/20 1222 03/10/20 1351 03/25/20 1414 04/15/20 0728 04/29/20 0904     Exercise Goal Re-Evaluation   Exercise Goals Review Able to understand and use rate of perceived exertion (RPE) scale;Knowledge and understanding of Target Heart Rate Range (THRR);Understanding of Exercise Prescription Able to understand and use rate of perceived exertion (RPE) scale;Knowledge and understanding of Target Heart Rate Range  (THRR);Understanding of Exercise Prescription;Increase Strength and Stamina;Increase  Physical Activity;Able to check pulse independently Able to understand and use rate of perceived exertion (RPE) scale;Knowledge and understanding of Target Heart Rate Range (THRR);Understanding of Exercise Prescription;Increase Strength and Stamina;Increase Physical Activity;Able to check pulse independently Able to understand and use rate of perceived exertion (RPE) scale;Knowledge and understanding of Target Heart Rate Range (THRR);Understanding of Exercise Prescription;Increase Strength and Stamina;Increase Physical Activity;Able to check pulse independently Able to understand and use rate of perceived exertion (RPE) scale;Knowledge and understanding of Target Heart Rate Range (THRR);Understanding of Exercise Prescription;Increase Strength and Stamina;Increase Physical Activity;Able to check pulse independently   Comments Pt's first day of exercise. Pt able to exercise 30 minutes with minimal difficulty. Will continue to monitor. Reviewed HEP with pt. Discussed THRR, RPE Scale, weather precautions, NTG use, endpoints of exercise, warmup and cool down. Pt is responding well to exercise while in cardiac rehab. Will follow up with pt to see if they are willing to try level 3 on Nustep. Pt is getting stronger and ready for a new level. Pt is doing well with exercise. Able to exercise 30 minutes with no difficulty. Pt completed 21 sessions of cardiac rehab. Pt increased fuctional capacity by 9.14%, increasing post walk test distance by 12f. Pt has thoroughly enjoyed participating in cardiac rehab.   Expected Outcomes Pt will continue to increase cardiovascular fitness. [facility]-40 [facility]. Pt will continue to work to increase stamina. Pt will continue to exercise most days of the week and increase cardiovascular strength. Pt is continuing to walk 3-4 days a week for 30 minutes, 2x  for 15 minutes. Pt will continue to exercise 4-6 days a week. Pt will walk for exercise for 30 minutes and working towards 45 minutes.          Nutrition & Weight - Outcomes:  Pre Biometrics - 02/17/20 1429      Pre Biometrics   Waist Circumference 38.75 inches    Hip Circumference 37 inches    Waist to Hip Ratio 1.05 %    Triceps Skinfold 6 mm    % Body Fat 22.8 %    Grip Strength 32.5 kg    Flexibility 10.75 in    Single Leg Stand 22.25 seconds           Post Biometrics - 04/09/20 1549       Post  Biometrics   Height '5\' 6"'  (1.676 m)    Weight 73.5 kg    Waist Circumference 39.5 inches    Hip Circumference 39 inches    Waist to Hip Ratio 1.01 %    BMI (Calculated) 26.17    Triceps Skinfold 7 mm    % Body Fat 24.3 %    Grip Strength 39 kg    Flexibility 11 in    Single Leg Stand 3.7 seconds           Nutrition:  Nutrition Therapy & Goals - 03/25/20 0737      Nutrition Therapy   Diet Heart Healthy      Personal Nutrition Goals   Nutrition Goal Pt to build a healthy plate including vegetables, fruits, whole grains, and low-fat dairy products in a heart healthy meal plan.    Personal Goal #2 Pt to make good choices when eating out at restaurants    Personal Goal #3 CBG concentrations in the normal range or as close to normal as is safely possible.      Intervention Plan  Intervention Prescribe, educate and counsel regarding individualized specific dietary modifications aiming towards targeted core components such as weight, hypertension, lipid management, diabetes, heart failure and other comorbidities.    Expected Outcomes Short Term Goal: Understand basic principles of dietary content, such as calories, fat, sodium, cholesterol and nutrients.           Nutrition Discharge:  Nutrition Assessments - 05/10/20 1350      MEDFICTS Scores   Post Score 51           Education Questionnaire Score:  Knowledge Questionnaire Score - 04/29/20 0900       Knowledge Questionnaire Score   Pre Score 21/24    Post Score 22/24           Goals reviewed with patient; copy given to patient.Pt graduated from cardiac rehab program today with completion of 21 exercise sessions in Phase II. Pt maintained good attendance and progressed nicely during his participation in rehab as evidenced by increased MET level.   Medication list reconciled. Repeat  PHQ score- 0 .  Pt has made significant lifestyle changes and should be commended for his success. Pt feels he has achieved his goals during cardiac rehab.   Pt plans to continue exercise at the [facility] 3 days a week and walking at home 3 days a week. James Braun increased his distance on his post exercise walk test by 134 feet. We are proud of James Braun's progress.Barnet Pall, RN,BSN 05/11/2020 12:42 PM

## 2020-04-22 ENCOUNTER — Encounter: Payer: Self-pay | Admitting: Gastroenterology

## 2020-04-22 ENCOUNTER — Other Ambulatory Visit: Payer: Self-pay

## 2020-04-22 ENCOUNTER — Ambulatory Visit (AMBULATORY_SURGERY_CENTER): Payer: PPO | Admitting: Gastroenterology

## 2020-04-22 VITALS — BP 100/59 | HR 79 | Temp 97.6°F | Resp 22 | Ht 67.0 in | Wt 160.0 lb

## 2020-04-22 DIAGNOSIS — D509 Iron deficiency anemia, unspecified: Secondary | ICD-10-CM

## 2020-04-22 DIAGNOSIS — K5730 Diverticulosis of large intestine without perforation or abscess without bleeding: Secondary | ICD-10-CM

## 2020-04-22 MED ORDER — SODIUM CHLORIDE 0.9 % IV SOLN: 500.0000 mL | Freq: Once | INTRAVENOUS | Status: DC

## 2020-04-22 NOTE — Patient Instructions (Signed)
Handout provided on diverticulosis   YOU HAD AN ENDOSCOPIC PROCEDURE TODAY AT [place]:   Refer to the procedure report that was given to you for any specific questions about what was found during the examination.  If the procedure report does not answer your questions, please call your gastroenterologist to clarify.  If you requested that your care partner not be given the details of your procedure findings, then the procedure report has been included in a sealed envelope for you to review at your convenience later.  YOU SHOULD EXPECT: Some feelings of bloating in the abdomen. Passage of more gas than usual.  Walking can help get rid of the air that was put into your GI tract during the procedure and reduce the bloating. If you had a lower endoscopy (such as a colonoscopy or flexible sigmoidoscopy) you may notice spotting of blood in your stool or on the toilet paper. If you underwent a bowel prep for your procedure, you may not have a normal bowel movement for a few days.  Please Note:  You might notice some irritation and congestion in your nose or some drainage.  This is from the oxygen used during your procedure.  There is no need for concern and it should clear up in a day or so.  SYMPTOMS TO REPORT IMMEDIATELY:   Following lower endoscopy (colonoscopy or flexible sigmoidoscopy):  Excessive amounts of blood in the stool  Significant tenderness or worsening of abdominal pains  Swelling of the abdomen that is new, acute  Fever of 100F or higher  For urgent or emergent issues, a gastroenterologist can be reached at any hour by calling [phone]. Do not use MyChart messaging for urgent concerns.    DIET:  We do recommend a small meal at first, but then you may proceed to your regular diet.  Drink plenty of fluids but you should avoid alcoholic beverages for 24 hours.  ACTIVITY:  You should plan to take it easy for the rest of today and you should NOT DRIVE or use  heavy machinery until tomorrow (because of the sedation medicines used during the test).    FOLLOW UP: Our staff will call the number listed on your records 48-72 hours following your procedure to check on you and address any questions or concerns that you may have regarding the information given to you following your procedure. If we do not reach you, we will leave a message.  We will attempt to reach you two times.  During this call, we will ask if you have developed any symptoms of COVID 19. If you develop any symptoms (ie: fever, flu-like symptoms, shortness of breath, cough etc.) before then, please call [phone].  If you test positive for Covid 19 in the 2 weeks post procedure, please call and report this information to [place].    SIGNATURES/CONFIDENTIALITY: You and/or your care partner have signed paperwork which will be entered into your electronic medical record.  These signatures attest to the fact that that the information above on your After Visit Summary has been reviewed and is understood.  Full responsibility of the confidentiality of this discharge information lies with you and/or your care-partner.

## 2020-04-22 NOTE — Progress Notes (Signed)
PT taken to PACU. Monitors in place. VSS. Report given to RN. 

## 2020-04-22 NOTE — Op Note (Signed)
[facility] Patient Name: James Braun Procedure Date: 04/22/2020 3:51 PM MRN: [id number] Endoscopist: Mallie Mussel L. [name] , MD Age: 79 Referring MD:  Date of Birth: [birth year] Gender: Male Account #: [id number] Procedure:                Colonoscopy Indications:              Iron deficiency anemia Medicines:                Monitored Anesthesia Care Procedure:                Pre-Anesthesia Assessment:                           - Prior to the procedure, a History and Physical                            was performed, and patient medications and                            allergies were reviewed. The patient's tolerance of                            previous anesthesia was also reviewed. The risks                            and benefits of the procedure and the sedation                            options and risks were discussed with the patient.                            All questions were answered, and informed consent                            was obtained. Prior Anticoagulants: The patient has                            taken no previous anticoagulant or antiplatelet                            agents except for aspirin. ASA Grade Assessment:                            III - A patient with severe systemic disease. After                            reviewing the risks and benefits, the patient was                            deemed in satisfactory condition to undergo the                            procedure.  After obtaining informed consent, the colonoscope                            was passed under direct vision. Throughout the                            procedure, the patient's blood pressure, pulse, and                            oxygen saturations were monitored continuously. The                            Colonoscope was introduced through the anus and                            advanced to the the cecum, identified by                             appendiceal orifice and ileocecal valve. The                            colonoscopy was performed without difficulty. The                            patient tolerated the procedure well. The quality                            of the bowel preparation was excellent. The                            ileocecal valve, appendiceal orifice, and rectum                            were photographed. Scope In: 4:00:52 PM Scope Out: 4:12:46 PM Scope Withdrawal Time: 0 hours 7 minutes 50 seconds  Total Procedure Duration: 0 hours 11 minutes 54 seconds  Findings:                 The perianal and digital rectal examinations were                            normal.                           A few diverticula were found in the left colon.                           The exam was otherwise without abnormality on                            direct and retroflexion views. Complications:            No immediate complications. Estimated Blood Loss:     Estimated blood loss: none. Impression:               - Diverticulosis in the left colon.                           -  The examination was otherwise normal on direct                            and retroflexion views.                           - No specimens collected.                           IDA most likely from intermittent gastric erosions                            due to large hiatal hernia seen on EGD earlier this                            year. Recommendation:           - Patient has a contact number available for                            emergencies. The signs and symptoms of potential                            delayed complications were discussed with the                            patient. Return to normal activities tomorrow.                            Written discharge instructions were provided to the                            patient.                           - Resume previous diet.                           - Continue present medications.                            - No future routine screening colonoscopy advised                            due to age and no polyps today.                           - Follow hemoglobin regularly with primary care and                            take iron supplement as needed. Rory Montel L. [name], MD 04/22/2020 4:17:26 PM This report has been signed electronically.

## 2020-04-23 ENCOUNTER — Other Ambulatory Visit (INDEPENDENT_AMBULATORY_CARE_PROVIDER_SITE_OTHER): Payer: PPO

## 2020-04-23 DIAGNOSIS — I2510 Atherosclerotic heart disease of native coronary artery without angina pectoris: Secondary | ICD-10-CM

## 2020-04-23 DIAGNOSIS — E1121 Type 2 diabetes mellitus with diabetic nephropathy: Secondary | ICD-10-CM

## 2020-04-23 DIAGNOSIS — N1831 Chronic kidney disease, stage 3a: Secondary | ICD-10-CM | POA: Diagnosis not present

## 2020-04-23 LAB — LIPID PANEL
Cholesterol: 133 mg/dL (ref 0–200)
HDL: 50.1 mg/dL (ref >39.00)
LDL Cholesterol: 67 mg/dL (ref 0–99)
NonHDL: 83.36
Total CHOL/HDL Ratio: 3
Triglycerides: 82 mg/dL (ref 0.0–149.0)
VLDL: 16.4 mg/dL (ref 0.0–40.0)

## 2020-04-23 LAB — COMPREHENSIVE METABOLIC PANEL
ALT: 16 U/L (ref 0–53)
AST: 22 U/L (ref 0–37)
Albumin: 4.4 g/dL (ref 3.5–5.2)
Alkaline Phosphatase: 41 U/L (ref 39–117)
BUN: 26 mg/dL — ABNORMAL HIGH (ref 6–23)
CO2: 29 mEq/L (ref 19–32)
Calcium: 9.4 mg/dL (ref 8.4–10.5)
Chloride: 109 mEq/L (ref 96–112)
Creatinine, Ser: 1.53 mg/dL — ABNORMAL HIGH (ref 0.40–1.50)
GFR: 42.28 mL/min — ABNORMAL LOW (ref >60.00)
Glucose, Bld: 125 mg/dL — ABNORMAL HIGH (ref 70–99)
Potassium: 3.6 mEq/L (ref 3.5–5.1)
Sodium: 144 mEq/L (ref 135–145)
Total Bilirubin: 0.3 mg/dL (ref 0.2–1.2)
Total Protein: 7 g/dL (ref 6.0–8.3)

## 2020-04-23 LAB — HEMOGLOBIN A1C: Hgb A1c MFr Bld: 6.6 % — ABNORMAL HIGH (ref 4.6–6.5)

## 2020-04-26 ENCOUNTER — Telehealth: Payer: Self-pay

## 2020-04-26 NOTE — Telephone Encounter (Signed)
  Follow up Call-  Call back number 04/22/2020 11/06/2019  Post procedure Call Back phone  # [phone]  Permission to leave phone message Yes Yes  Some recent data might be hidden     Patient questions:  Do you have a fever, pain , or abdominal swelling? No. Pain Score  0 *  Have you tolerated food without any problems? Yes.    Have you been able to return to your normal activities? Yes.    Do you have any questions about your discharge instructions: Diet   No. Medications  No. Follow up visit  No.  Do you have questions or concerns about your Care? No.  Actions: * If pain score is 4 or above: No action needed, pain <4.  1. Have you developed a fever since your procedure? no  2.   Have you had an respiratory symptoms (SOB or cough) since your procedure? no  3.   Have you tested positive for COVID 19 since your procedure no  4.   Have you had any family members/close contacts diagnosed with the COVID 19 since your procedure?  no   If yes to any of these questions please route to Joylene John, RN and Joella Prince, RN

## 2020-04-30 DIAGNOSIS — H40001 Preglaucoma, unspecified, right eye: Secondary | ICD-10-CM | POA: Diagnosis not present

## 2020-05-01 ENCOUNTER — Ambulatory Visit: Payer: PPO | Attending: Internal Medicine

## 2020-05-01 DIAGNOSIS — Z23 Encounter for immunization: Secondary | ICD-10-CM

## 2020-05-01 NOTE — Progress Notes (Signed)
° °  Covid-19 Vaccination Clinic  Name:  YORK VALLIANT    MRN: [id number] DOB: 1940-08-28  05/01/2020  Mr. Lamartina was observed post Covid-19 immunization for 15 minutes without incident. He was provided with Vaccine Information Sheet and instruction to access the V-Safe system.   Mr. Humbarger was instructed to call 911 with any severe reactions post vaccine:  Difficulty breathing   Swelling of face and throat   A fast heartbeat   A bad rash all over body   Dizziness and weakness

## 2020-05-10 NOTE — Addendum Note (Signed)
Encounter addended by: George Ina, RD on: 05/10/2020 1:55 PM  Actions taken: Flowsheet accepted

## 2020-05-11 NOTE — Addendum Note (Signed)
Encounter addended by: Magda Kiel, RN on: 05/11/2020 12:44 PM  Actions taken: Clinical Note Signed

## 2020-05-12 NOTE — Progress Notes (Addendum)
Advanced Heart Failure Clinic Note   Referring Physician: PCP: [name], MD PCP-Cardiologist: Quay Burow, MD  [facility]: Dr. Haroldine Laws   Reason for Visit: [facility] F/u, Chronic Systolic Heart Failure/ Ischemic Cardiomyopathy    HPI: Mr. Boomershine is a 79 y/o male w/ HTN, HLD, T2DM admitted 5/21 for LHC in the setting of new CP and abnormal NST. LHC showed severe multivessel CAD and he was referred for CABG. Preop echo showed moderately reduced LVEF, 35-40%, RV normal. No significant valvular disease. Underwent CABG x 3 (LIMA-LAD, SVG-diag, SVG-PLB) on 5/14 by Dr. [name].     Post operative course c/b atrial fibrillation + post cardiotomy shock requiring amiodarone and milrinone. Post op echo showed EF ~35% and mildly reduced RV systolic function.   I last saw him in 7/21.  He had been feeling great until he had mechanical fall with head trauma. We sent him for CT which was ok.  Says he is feeling much better since las visit. Back to baseline except he feels a bit unsteady on his feet and feels himself lurching to the side at times. No CP or SOB. No orthostatic symptoms    Echo 5/21: LVEF 35% w/ diffuse hypokinesis, RV systolic function mildly reduced   Review of Systems: Pertinent +/- outline in HPI    Past Medical History:  Diagnosis Date  . BPH (benign prostatic hypertrophy)   . Cancer ([place])    melanoma  . Coronary artery disease   . History of kidney stones   . History of melanoma excision OCT 2010  . Hyperlipidemia   . Hypertension   . Left ureteral calculus   . Nephrolithiasis    bilateral  . Type 2 diabetes mellitus ([place])     Current Outpatient Medications  Medication Sig Dispense Refill  . alum hydroxide-mag trisilicate (GAVISCON) 87-68 MG CHEW chewable tablet Chew 2 tablets by mouth 3 (three) times daily as needed for indigestion or heartburn.    Marland Kitchen aspirin EC 81 MG EC tablet Take 1 tablet (81 mg total) by mouth daily.    . Cholecalciferol  (VITAMIN D3) 250 MCG (10000 UT) TABS Take 1 tablet by mouth 2 (two) times daily.    . Cyanocobalamin (VITAMIN B-12) 1000 MCG SUBL Place under the tongue.    . finasteride (PROSCAR) 5 MG tablet Take 1 tablet (5 mg total) by mouth daily. 90 tablet 3  . metFORMIN (GLUCOPHAGE-XR) 500 MG 24 hr tablet Take 1 tablet (500 mg total) by mouth 2 (two) times daily. 180 tablet 3  . Multiple Vitamin (MULTIVITAMIN) tablet Take 1 tablet by mouth daily.    Marland Kitchen omeprazole (PRILOSEC) 20 MG capsule Take 20 mg by mouth as needed.    . Potassium Citrate 15 MEQ (1620 MG) TBCR Take 1 tablet by mouth in the morning and at bedtime.    . rosuvastatin (CRESTOR) 10 MG tablet Take 10 mg by mouth daily.    . sacubitril-valsartan (ENTRESTO) 24-26 MG Take 1 tablet by mouth 2 (two) times daily. 60 tablet 3  . spironolactone (ALDACTONE) 25 MG tablet Take 1 tablet (25 mg total) by mouth daily. 30 tablet 3  . timolol (TIMOPTIC) 0.5 % ophthalmic solution Place 1 drop into both eyes 2 (two) times daily.      No current facility-administered medications for this encounter.    Allergies  Allergen Reactions  . Atorvastatin Other (See Comments)    myalgias  . Morphine And Related Other (See Comments)  hypotension  . Levofloxacin Rash  . Septra [Sulfamethoxazole-Trimethoprim] Rash      Social History   Socioeconomic History  . Marital status: Married    Spouse name: Not on file  . Number of children: 2  . Years of education: Not on file  . Highest education level: Not on file  Occupational History  . 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 Occupation: Buys very distressed houses and rehabilitates  . Occupation: Does mediation  Tobacco Use  . Smoking status: Never Smoker  . Smokeless tobacco: Never Used  Substance and Sexual Activity  . Alcohol use: No  . Drug use: No  . Sexual activity: Not on file  Other Topics Concern  . Not on file  Social History Narrative   Has living will   Wife is health care  POA---alternate is committee of physicians   Would accept resuscitation---wouldn't accept prolonged ventilation   No tube feeds if cognitively unaware   Social Determinants of Health   Financial Resource Strain:   . Difficulty of Paying Living Expenses: Not on file  Food Insecurity:   . Worried About Charity fundraiser in the Last Year: Not on file  . Ran Out of Food in the Last Year: Not on file  Transportation Needs:   . Lack of Transportation (Medical): Not on file  . Lack of Transportation (Non-Medical): Not on file  Physical Activity:   . Days of Exercise per Week: Not on file  . Minutes of Exercise per Session: Not on file  Stress:   . Feeling of Stress : Not on file  Social Connections:   . Frequency of Communication with Friends and Family: Not on file  . Frequency of Social Gatherings with Friends and Family: Not on file  . Attends Religious Services: Not on file  . Active Member of Clubs or Organizations: Not on file  . Attends Archivist Meetings: Not on file  . Marital Status: Not on file  Intimate Partner Violence:   . Fear of Current or Ex-Partner: Not on file  . Emotionally Abused: Not on file  . Physically Abused: Not on file  . Sexually Abused: Not on file      Family History  Problem Relation Age of Onset  . Hypertension Mother   . Diabetes Father   . Hypertension Father   . Colon polyps Brother   . Coronary artery disease Neg Hx   . Cancer Neg Hx   . Esophageal cancer Neg Hx   . Stomach cancer Neg Hx   . Rectal cancer Neg Hx     Vitals:   05/14/20 0919  BP: 124/78  Pulse: 83  SpO2: 96%  Weight: 75.1 kg (165 lb 9.6 oz)     PHYSICAL EXAM: General:  Well appearing. No resp difficulty HEENT: normal Neck: supple. no JVD. Carotids 2+ bilat; no bruits. No lymphadenopathy or thryomegaly appreciated. Cor: PMI nondisplaced. Regular rate & rhythm. No rubs, gallops or murmurs. Lungs: clear Abdomen: soft, nontender, nondistended. No  hepatosplenomegaly. No bruits or masses. Good bowel sounds. Extremities: no cyanosis, clubbing, rash, edema Neuro: alert & orientedx3, cranial nerves grossly intact. moves all 4 extremities w/o difficulty. Affect pleasant   ASSESSMENT & PLAN:  1. CAD - s/p CABG x 3 5/21 (LIMA-LAD, SVG-dig, SVG- PLB) - no s/s angina - continue ASA + statin  - add carvedilol 3.125 bid - finished CR   2.Chronic Systolic HF/ Ischemic Cardiomyopathy  - Echo 5/21 LVEF 28%-41%, RV systolic function  mildly reduced, in the setting of multivessel CAD s/p CABG - Developed Post Cardiotomy Cardiogenic Shock following CABG and required short course of milrinone.   - Volume status stable. Euvolemic on exam - NYHA II - Continue Entresto 24-26 bid. - Continue Spironolactone 25 mg daily  - start carvedilol 3.125 bid - possible SGLT2i down the road - repeat echo    4. Post-operative Afib - maintaining NSR. Now off amio  - no longer on Eliquis (d/c by PCP due to melena, now resolved). Colonoscopy 0/21 ok,    5. PVCs - noted to have frequent PVCs pos operatively  - suppressed w/ amiodarone. Now off amio  - check ECG today. Start b-blocker  6. Dizziness - refer to neurology  Glori Bickers, MD 05/14/20

## 2020-05-14 ENCOUNTER — Encounter: Payer: Self-pay | Admitting: Neurology

## 2020-05-14 ENCOUNTER — Ambulatory Visit (HOSPITAL_COMMUNITY)
Admission: RE | Admit: 2020-05-14 | Discharge: 2020-05-14 | Disposition: A | Discharge status: Home / Self Care | Payer: PPO | Source: Ambulatory Visit | Attending: Internal Medicine | Admitting: Internal Medicine

## 2020-05-14 ENCOUNTER — Encounter (HOSPITAL_COMMUNITY): Payer: Self-pay | Admitting: Internal Medicine

## 2020-05-14 ENCOUNTER — Other Ambulatory Visit: Payer: Self-pay

## 2020-05-14 VITALS — Wt 165.6 lb

## 2020-05-14 DIAGNOSIS — E119 Type 2 diabetes mellitus without complications: Secondary | ICD-10-CM | POA: Diagnosis not present

## 2020-05-14 DIAGNOSIS — E785 Hyperlipidemia, unspecified: Secondary | ICD-10-CM | POA: Insufficient documentation

## 2020-05-14 DIAGNOSIS — I5022 Chronic systolic (congestive) heart failure: Secondary | ICD-10-CM | POA: Diagnosis not present

## 2020-05-14 DIAGNOSIS — Z79899 Other long term (current) drug therapy: Secondary | ICD-10-CM | POA: Insufficient documentation

## 2020-05-14 DIAGNOSIS — I493 Ventricular premature depolarization: Secondary | ICD-10-CM | POA: Diagnosis not present

## 2020-05-14 DIAGNOSIS — Z8249 Family history of ischemic heart disease and other diseases of the circulatory system: Secondary | ICD-10-CM | POA: Diagnosis not present

## 2020-05-14 DIAGNOSIS — I97190 Other postprocedural cardiac functional disturbances following cardiac surgery: Secondary | ICD-10-CM | POA: Insufficient documentation

## 2020-05-14 DIAGNOSIS — Z951 Presence of aortocoronary bypass graft: Secondary | ICD-10-CM

## 2020-05-14 DIAGNOSIS — I4891 Unspecified atrial fibrillation: Secondary | ICD-10-CM | POA: Insufficient documentation

## 2020-05-14 DIAGNOSIS — Z7982 Long term (current) use of aspirin: Secondary | ICD-10-CM | POA: Diagnosis not present

## 2020-05-14 DIAGNOSIS — I2510 Atherosclerotic heart disease of native coronary artery without angina pectoris: Secondary | ICD-10-CM | POA: Diagnosis not present

## 2020-05-14 DIAGNOSIS — I110 Hypertensive heart disease with heart failure: Secondary | ICD-10-CM | POA: Diagnosis not present

## 2020-05-14 DIAGNOSIS — R42 Dizziness and giddiness: Secondary | ICD-10-CM | POA: Diagnosis not present

## 2020-05-14 DIAGNOSIS — Z7984 Long term (current) use of oral hypoglycemic drugs: Secondary | ICD-10-CM | POA: Diagnosis not present

## 2020-05-14 DIAGNOSIS — I255 Ischemic cardiomyopathy: Secondary | ICD-10-CM | POA: Diagnosis not present

## 2020-05-14 MED ORDER — CARVEDILOL 3.125 MG PO TABS: 3.1250 mg | ORAL_TABLET | Freq: Two times a day (BID) | ORAL | 6 refills | Status: DC

## 2020-05-14 NOTE — Addendum Note (Signed)
Encounter addended by: Malena Edman, RN on: 05/14/2020 9:56 AM  Actions taken: Vitals modified

## 2020-05-14 NOTE — Patient Instructions (Addendum)
Start Carvedilol 3.125 mg  2 times a day.  Your physician has requested that you have an echocardiogram. Echocardiography is a painless test that uses sound waves to create images of your heart. It provides your doctor with information about the size and shape of your heart and how well your heart's chambers and valves are working. This procedure takes approximately one hour. There are no restrictions for this procedure.  You have been referred to [facility] Neurology they will call for an appointment.  Your physician recommends that you schedule a follow-up appointment in: 4 months.

## 2020-05-14 NOTE — Addendum Note (Signed)
Encounter addended by: Stanford Scotland, RN on: 05/14/2020 10:05 AM  Actions taken: Order list changed, Diagnosis association updated, Clinical Note Signed

## 2020-05-19 ENCOUNTER — Other Ambulatory Visit: Payer: Self-pay

## 2020-05-19 ENCOUNTER — Ambulatory Visit (INDEPENDENT_AMBULATORY_CARE_PROVIDER_SITE_OTHER): Payer: PPO | Admitting: Internal Medicine

## 2020-05-19 ENCOUNTER — Encounter: Payer: Self-pay | Admitting: Internal Medicine

## 2020-05-19 ENCOUNTER — Ambulatory Visit (HOSPITAL_COMMUNITY)
Admission: RE | Admit: 2020-05-19 | Discharge: 2020-05-19 | Disposition: A | Discharge status: Home / Self Care | Payer: PPO | Source: Ambulatory Visit | Attending: Internal Medicine | Admitting: Internal Medicine

## 2020-05-19 VITALS — BP 132/74 | HR 74 | Temp 98.0°F | Wt 166.0 lb

## 2020-05-19 DIAGNOSIS — I2510 Atherosclerotic heart disease of native coronary artery without angina pectoris: Secondary | ICD-10-CM | POA: Insufficient documentation

## 2020-05-19 DIAGNOSIS — I5022 Chronic systolic (congestive) heart failure: Secondary | ICD-10-CM

## 2020-05-19 DIAGNOSIS — E785 Hyperlipidemia, unspecified: Secondary | ICD-10-CM | POA: Diagnosis not present

## 2020-05-19 DIAGNOSIS — R270 Ataxia, unspecified: Secondary | ICD-10-CM | POA: Diagnosis not present

## 2020-05-19 DIAGNOSIS — E119 Type 2 diabetes mellitus without complications: Secondary | ICD-10-CM | POA: Diagnosis not present

## 2020-05-19 DIAGNOSIS — Z23 Encounter for immunization: Secondary | ICD-10-CM | POA: Diagnosis not present

## 2020-05-19 DIAGNOSIS — I509 Heart failure, unspecified: Secondary | ICD-10-CM | POA: Insufficient documentation

## 2020-05-19 DIAGNOSIS — I110 Hypertensive heart disease with heart failure: Secondary | ICD-10-CM | POA: Diagnosis not present

## 2020-05-19 LAB — ECHOCARDIOGRAM COMPLETE
Area-P 1/2: 3.53 cm2
S' Lateral: 2.4 cm
Weight: 2656 oz

## 2020-05-19 NOTE — Progress Notes (Signed)
Subjective:    Patient ID: James Braun, male    DOB: 10-27-1940, 79 y.o.   MRN: [id number]  HPI Here due to dizziness This visit occurred during the SARS-CoV-2 public health emergency.  Safety protocols were in place, including screening questions prior to the visit, additional usage of staff PPE, and extensive cleaning of exam room while observing appropriate contact time as indicated for disinfecting solutions.   Close to 6 months since CABG Done with surgeon Still feels dizziness---like he has to hold onto things Wonders about stopping entresto/spironolactone to help this  The dizziness started after his surgery Feels like "lightheadedness"---will lurch to the side Doesn't feel as "sure footed" May need walking stick--finds himself holding onto things No vertigo  No symptoms when supine No problems when standing up No feeling like he would pass out Doesn't affect his driving  Current Outpatient Medications on File Prior to Visit  Medication Sig Dispense Refill  . alum hydroxide-mag trisilicate (GAVISCON) 09-32 MG CHEW chewable tablet Chew 2 tablets by mouth 3 (three) times daily as needed for indigestion or heartburn.    Marland Kitchen aspirin EC 81 MG EC tablet Take 1 tablet (81 mg total) by mouth daily.    . carvedilol (COREG) 3.125 MG tablet Take 1 tablet (3.125 mg total) by mouth 2 (two) times daily. 60 tablet 6  . Cholecalciferol (VITAMIN D3) 250 MCG (10000 UT) TABS Take 1 tablet by mouth 2 (two) times daily.    . Cyanocobalamin (VITAMIN B-12) 1000 MCG SUBL Place under the tongue.    . finasteride (PROSCAR) 5 MG tablet Take 1 tablet (5 mg total) by mouth daily. 90 tablet 3  . metFORMIN (GLUCOPHAGE-XR) 500 MG 24 hr tablet Take 1 tablet (500 mg total) by mouth 2 (two) times daily. 180 tablet 3  . Multiple Vitamin (MULTIVITAMIN) tablet Take 1 tablet by mouth daily.    Marland Kitchen omeprazole (PRILOSEC) 20 MG capsule Take 20 mg by mouth as needed.    . Potassium Citrate 15 MEQ (1620 MG) TBCR Take  1 tablet by mouth in the morning and at bedtime.    . rosuvastatin (CRESTOR) 10 MG tablet Take 10 mg by mouth daily.    . sacubitril-valsartan (ENTRESTO) 24-26 MG Take 1 tablet by mouth 2 (two) times daily. 60 tablet 3  . spironolactone (ALDACTONE) 25 MG tablet Take 1 tablet (25 mg total) by mouth daily. 30 tablet 3  . timolol (TIMOPTIC) 0.5 % ophthalmic solution Place 1 drop into both eyes 2 (two) times daily.      No current facility-administered medications on file prior to visit.    Allergies  Allergen Reactions  . Atorvastatin Other (See Comments)    myalgias  . Morphine And Related Other (See Comments)     hypotension  . Levofloxacin Rash  . Septra [Sulfamethoxazole-Trimethoprim] Rash    Past Medical History:  Diagnosis Date  . BPH (benign prostatic hypertrophy)   . Cancer ([place])    melanoma  . Coronary artery disease   . History of kidney stones   . History of melanoma excision OCT 2010  . Hyperlipidemia   . Hypertension   . Left ureteral calculus   . Nephrolithiasis    bilateral  . Type 2 diabetes mellitus ([place])     Past Surgical History:  Procedure Laterality Date  . CARDIAC CATHETERIZATION    . CHOLECYSTECTOMY  july 2000  . CORONARY ARTERY BYPASS GRAFT N/A 11/28/2019   Procedure: CORONARY ARTERY BYPASS GRAFTING (CABG) x3, using left  internal mammary artery and Saphenous vein harvested endoscopically;  Surgeon: [name], MD;  Location: [facility];  Service: Open Heart Surgery;  Laterality: N/A;  . CYSTOSCOPY WITH STENT PLACEMENT Left 11/14/2012   Procedure: CYSTOSCOPY WITH STENT PLACEMENT;  Surgeon: [name], MD;  Location: [facility];  Service: Urology;  Laterality: Left;  . CYSTOSCOPY/RETROGRADE/URETEROSCOPY/STONE EXTRACTION WITH BASKET Left 11/14/2012   Procedure: CYSTOSCOPY/RETROGRADE/URETEROSCOPY/STONE EXTRACTION WITH BASKET;  Surgeon: [name], MD;  Location: [facility];  Service: Urology;  Laterality: Left;  .  EXTRACORPOREAL SHOCK WAVE LITHOTRIPSY  JULY 2000   X2  . HOLMIUM LASER APPLICATION Left 03/23/2951   Procedure: HOLMIUM LASER APPLICATION;  Surgeon: [name], MD;  Location: [facility];  Service: Urology;  Laterality: Left;  . LEFT HEART CATH AND CORONARY ANGIOGRAPHY N/A 11/27/2019   Procedure: LEFT HEART CATH AND CORONARY ANGIOGRAPHY;  Surgeon: [name], MD;  Location: [facility];  Service: Cardiovascular;  Laterality: N/A;  . LEFT URETEROSCOPIC STONE EXTRACTION  02-22-2007  . NEPHROLITHOTOMY Right 09-28-2010   percutaneous  . ORIF CLAVICLE FRACTURE  2006  . PROSTATE BIOPSY  1998   neg  . SHOULDER ARTHROSCOPY  1/15   Dr [name]  . TEE WITHOUT CARDIOVERSION N/A 11/28/2019   Procedure: TRANSESOPHAGEAL ECHOCARDIOGRAM (TEE);  Surgeon: [name], Collier Salina, MD;  Location: [facility];  Service: Open Heart Surgery;  Laterality: N/A;  . TONSILLECTOMY      Family History  Problem Relation Age of Onset  . Hypertension Mother   . Diabetes Father   . Hypertension Father   . Colon polyps Brother   . Coronary artery disease Neg Hx   . Cancer Neg Hx   . Esophageal cancer Neg Hx   . Stomach cancer Neg Hx   . Rectal cancer Neg Hx     Social History   Socioeconomic History  . Marital status: Married    Spouse name: Not on file  . Number of children: 2  . Years of education: Not on file  . Highest education level: Not on file  Occupational History  . 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 Occupation: Buys very distressed houses and rehabilitates  . Occupation: Does mediation  Tobacco Use  . Smoking status: Never Smoker  . Smokeless tobacco: Never Used  Substance and Sexual Activity  . Alcohol use: No  . Drug use: No  . Sexual activity: Not on file  Other Topics Concern  . Not on file  Social History Narrative   Has living will   Wife is health care POA---alternate is committee of physicians   Would accept resuscitation---wouldn't accept  prolonged ventilation   No tube feeds if cognitively unaware   Social Determinants of Health   Financial Resource Strain:   . Difficulty of Paying Living Expenses: Not on file  Food Insecurity:   . Worried About Charity fundraiser in the Last Year: Not on file  . Ran Out of Food in the Last Year: Not on file  Transportation Needs:   . Lack of Transportation (Medical): Not on file  . Lack of Transportation (Non-Medical): Not on file  Physical Activity:   . Days of Exercise per Week: Not on file  . Minutes of Exercise per Session: Not on file  Stress:   . Feeling of Stress : Not on file  Social Connections:   . Frequency of Communication with Friends and Family: Not on file  . Frequency of Social Gatherings with  Friends and Family: Not on file  . Attends Religious Services: Not on file  . Active Member of Clubs or Organizations: Not on file  . Attends Archivist Meetings: Not on file  . Marital Status: Not on file  Intimate Partner Violence:   . Fear of Current or Ex-Partner: Not on file  . Emotionally Abused: Not on file  . Physically Abused: Not on file  . Sexually Abused: Not on file   Review of Systems  Fell over water hose at home---"tried to outrun it" but fell forward Hit chest with left arm and hit maxilla on left CT head was negative     Objective:   Physical Exam Constitutional:      Appearance: Normal appearance.  Eyes:     Comments: No nystagmus  Neurological:     Mental Status: He is alert.     Comments: Mild ataxia Sways with Romberg but not overtly positive            Assessment & Plan:

## 2020-05-19 NOTE — Assessment & Plan Note (Signed)
I urged him to continue current meds (entresto,spironolactone) He is reluctant to try carvedilol--I asked him to start or at least review with Dr Sung Amabile

## 2020-05-19 NOTE — Assessment & Plan Note (Addendum)
He feels this is since the CABG but he also had fall with frontal head trauma I suspect there was damage during the cardiopulmonary bypass---but small chance of concussion (he felt the balance issues were immediately post op) Will check brain MRI Proceed with scheduled neurology evaluation--unless clear cut diagnosis from the MRI Discussed need for cane or walking stick

## 2020-05-19 NOTE — Addendum Note (Signed)
Addended by: Corinna Capra D on: 05/19/2020 10:37 AM   Modules accepted: Orders

## 2020-05-19 NOTE — Progress Notes (Signed)
  Echocardiogram 2D Echocardiogram has been performed.  James Braun 05/19/2020, 2:47 PM

## 2020-05-25 ENCOUNTER — Other Ambulatory Visit (HOSPITAL_COMMUNITY): Payer: Self-pay | Admitting: Cardiology

## 2020-05-28 ENCOUNTER — Telehealth: Payer: Self-pay | Admitting: Internal Medicine

## 2020-05-28 NOTE — Chronic Care Management (AMB) (Signed)
  Chronic Care Management   Note  05/28/2020 Name: MEHTAAB MAYEDA MRN: [id number] DOB: 01/13/1941  DVAUGHN FICKLE is a 79 y.o. year old male who is a primary care patient of [name], MD. I reached out to Elder Love by phone today in response to a referral sent by Mr. Kristofer Schaffert Waltz's PCP, [name], MD.   Mr. Grissett was given information about Chronic Care Management services today including:  1. CCM service includes personalized support from designated clinical staff supervised by his physician, including individualized plan of care and coordination with other care providers 2. 24/7 contact phone numbers for assistance for urgent and routine care needs. 3. Service will only be billed when office clinical staff spend 20 minutes or more in a month to coordinate care. 4. Only one practitioner may furnish and bill the service in a calendar month. 5. The patient may stop CCM services at any time (effective at the end of the month) by phone call to the office staff.   Patient agreed to services and verbal consent obtained.   Follow up plan:   [place]

## 2020-05-28 NOTE — Chronic Care Management (AMB) (Signed)
  Chronic Care Management   Outreach Note  05/28/2020 Name: James Braun MRN: [id number] DOB: July 17, 1941  Referred by: [name], MD Reason for referral : Chronic Care Management   An unsuccessful telephone outreach was attempted today. The patient was referred to the pharmacist for assistance with care management and care coordination.   Follow Up Plan:   James Braun  Upstream Scheduler

## 2020-06-07 ENCOUNTER — Other Ambulatory Visit: Payer: Self-pay

## 2020-06-07 ENCOUNTER — Ambulatory Visit
Admission: RE | Admit: 2020-06-07 | Discharge: 2020-06-07 | Disposition: A | Discharge status: Home / Self Care | Payer: PPO | Source: Ambulatory Visit | Attending: Internal Medicine | Admitting: Internal Medicine

## 2020-06-07 DIAGNOSIS — R29818 Other symptoms and signs involving the nervous system: Secondary | ICD-10-CM | POA: Diagnosis not present

## 2020-06-07 DIAGNOSIS — R270 Ataxia, unspecified: Secondary | ICD-10-CM

## 2020-06-07 DIAGNOSIS — I6782 Cerebral ischemia: Secondary | ICD-10-CM | POA: Diagnosis not present

## 2020-06-07 DIAGNOSIS — G319 Degenerative disease of nervous system, unspecified: Secondary | ICD-10-CM | POA: Diagnosis not present

## 2020-06-07 DIAGNOSIS — I6389 Other cerebral infarction: Secondary | ICD-10-CM | POA: Diagnosis not present

## 2020-06-08 DIAGNOSIS — L570 Actinic keratosis: Secondary | ICD-10-CM | POA: Diagnosis not present

## 2020-06-08 DIAGNOSIS — X32XXXD Exposure to sunlight, subsequent encounter: Secondary | ICD-10-CM | POA: Diagnosis not present

## 2020-06-11 ENCOUNTER — Other Ambulatory Visit: Payer: Self-pay

## 2020-06-11 ENCOUNTER — Emergency Department (HOSPITAL_COMMUNITY)
Admission: EM | Admit: 2020-06-11 | Discharge: 2020-06-11 | Disposition: A | Discharge status: Home / Self Care | Payer: PPO | Attending: Emergency Medicine | Admitting: Emergency Medicine

## 2020-06-11 ENCOUNTER — Encounter (HOSPITAL_COMMUNITY): Payer: Self-pay | Admitting: Emergency Medicine

## 2020-06-11 ENCOUNTER — Emergency Department (HOSPITAL_COMMUNITY): Payer: PPO

## 2020-06-11 DIAGNOSIS — Z7984 Long term (current) use of oral hypoglycemic drugs: Secondary | ICD-10-CM | POA: Diagnosis not present

## 2020-06-11 DIAGNOSIS — M25532 Pain in left wrist: Secondary | ICD-10-CM | POA: Diagnosis not present

## 2020-06-11 DIAGNOSIS — L03114 Cellulitis of left upper limb: Secondary | ICD-10-CM | POA: Insufficient documentation

## 2020-06-11 DIAGNOSIS — E1121 Type 2 diabetes mellitus with diabetic nephropathy: Secondary | ICD-10-CM | POA: Insufficient documentation

## 2020-06-11 DIAGNOSIS — Z79899 Other long term (current) drug therapy: Secondary | ICD-10-CM | POA: Insufficient documentation

## 2020-06-11 DIAGNOSIS — N183 Chronic kidney disease, stage 3 unspecified: Secondary | ICD-10-CM | POA: Insufficient documentation

## 2020-06-11 DIAGNOSIS — I5022 Chronic systolic (congestive) heart failure: Secondary | ICD-10-CM | POA: Diagnosis not present

## 2020-06-11 DIAGNOSIS — Z7982 Long term (current) use of aspirin: Secondary | ICD-10-CM | POA: Insufficient documentation

## 2020-06-11 DIAGNOSIS — S6991XA Unspecified injury of right wrist, hand and finger(s), initial encounter: Secondary | ICD-10-CM | POA: Diagnosis not present

## 2020-06-11 DIAGNOSIS — E1122 Type 2 diabetes mellitus with diabetic chronic kidney disease: Secondary | ICD-10-CM | POA: Diagnosis not present

## 2020-06-11 DIAGNOSIS — I480 Paroxysmal atrial fibrillation: Secondary | ICD-10-CM | POA: Diagnosis not present

## 2020-06-11 DIAGNOSIS — Z89421 Acquired absence of other right toe(s): Secondary | ICD-10-CM | POA: Diagnosis not present

## 2020-06-11 DIAGNOSIS — I130 Hypertensive heart and chronic kidney disease with heart failure and stage 1 through stage 4 chronic kidney disease, or unspecified chronic kidney disease: Secondary | ICD-10-CM | POA: Insufficient documentation

## 2020-06-11 DIAGNOSIS — L03119 Cellulitis of unspecified part of limb: Secondary | ICD-10-CM

## 2020-06-11 DIAGNOSIS — L03113 Cellulitis of right upper limb: Secondary | ICD-10-CM | POA: Diagnosis not present

## 2020-06-11 DIAGNOSIS — M79641 Pain in right hand: Secondary | ICD-10-CM | POA: Diagnosis present

## 2020-06-11 MED ORDER — CEPHALEXIN 500 MG PO CAPS: 500.0000 mg | ORAL_CAPSULE | Freq: Three times a day (TID) | ORAL | 0 refills | Status: DC

## 2020-06-11 MED ORDER — CEPHALEXIN 250 MG PO CAPS
500.0000 mg | ORAL_CAPSULE | Freq: Once | ORAL | Status: AC
  Administered 2020-06-11: 500 mg via ORAL
  Filled 2020-06-11: qty 2

## 2020-06-11 NOTE — ED Notes (Signed)
Pt in xray, transport will bring pt to bed once finished

## 2020-06-11 NOTE — Discharge Instructions (Signed)
Please read and follow all provided instructions.  Your diagnoses today include:  1. Cellulitis of hand     Tests performed today include:  Vital signs. See below for your results today.   X-ray of your hand - does not show broken bones  Medications prescribed:   Keflex (cephalexin) - antibiotic  You have been prescribed an antibiotic medicine: take the entire course of medicine even if you are feeling better. Stopping early can cause the antibiotic not to work.  Take any prescribed medications only as directed.   Home care instructions:  Follow any educational materials contained in this packet. Keep affected area above the level of your heart when possible. Wash area gently twice a day with warm soapy water. Do not apply alcohol or hydrogen peroxide. Cover the area if it draining or weeping.   Follow-up instructions:  You need to follow-up with your doctor on Monday for recheck of your hand and wrist.  As we discussed, signs of a joint infection can include fever, chills, nausea or vomiting, severe pain when moving your hand or wrist.  If these occur you should return to the emergency department for recheck sooner.  Please follow-up with your primary care provider in the next 1 week for further evaluation of your symptoms.   Return instructions:  Return to the Emergency Department if you have:  Fever  Worsening symptoms  Worsening pain  Worsening swelling  Redness of the skin that moves away from the affected area, especially if it streaks away from the affected area   Any other emergent concerns  Your vital signs today were: BP (!) 143/91 (BP Location: Left Arm)   Pulse 81   Temp 97.8 F (36.6 C) (Oral)   Resp 18   SpO2 99%  If your blood pressure (BP) was elevated above 135/85 this visit, please have this repeated by your doctor within one month. --------------

## 2020-06-11 NOTE — ED Provider Notes (Signed)
[facility] EMERGENCY DEPARTMENT Provider Note   CSN: [id number] Arrival date & time: 06/11/20  1026     History Chief Complaint  Patient presents with  . Hand Pain    James Braun is a 79 y.o. male.  Patient presents the emergency department for evaluation of hand and wrist pain on the right side starting about 2 days ago.  Patient sustained a small wound to his left index finger 3 days ago while working outside.  He states that he has had slowly worsening pain in the left hand and wrist, more so on the radial side.  No fevers or chills.  No nausea or vomiting.  He has been using over-the-counter ibuprofen, leftover hydrocodone, ice and heat without improvement.  He had difficulty sleeping last night due to the pain.  No elbow or wrist pain.  Patient does have a history of osteoarthritis, no previous history of gout.        Past Medical History:  Diagnosis Date  . BPH (benign prostatic hypertrophy)   . Cancer ([place])    melanoma  . Coronary artery disease   . History of kidney stones   . History of melanoma excision OCT 2010  . Hyperlipidemia   . Hypertension   . Left ureteral calculus   . Nephrolithiasis    bilateral  . Type 2 diabetes mellitus [facility])     Patient Active Problem List   Diagnosis Date Noted  . Ataxia 05/19/2020  . Statin myopathy 02/10/2020  . Heart failure, systolic, chronic ([place]) 65/78/4696  . PAF (paroxysmal atrial fibrillation) ([place]) 12/03/2019  . S/P CABG x 3 11/28/2019  . CAD in native artery 11/27/2019  . GERD (gastroesophageal reflux disease) 06/30/2019  . Anemia in chronic kidney disease (CKD) 09/05/2018  . Chronic renal disease, stage III ([place]) 03/06/2018  . Counseling regarding advanced directives 02/13/2014  . Onychomycosis 04/25/2013  . Routine general medical examination at a health care facility 01/04/2011  . BPH without obstruction/lower urinary tract symptoms 07/13/2010  . Controlled type 2 diabetes mellitus  with diabetic nephropathy ([place]) 01/11/2010  . Hyperlipemia 01/11/2010  . Essential hypertension, benign 01/11/2010  . NEPHROLITHIASIS, HX OF 01/11/2010    Past Surgical History:  Procedure Laterality Date  . CARDIAC CATHETERIZATION    . CHOLECYSTECTOMY  july 2000  . CORONARY ARTERY BYPASS GRAFT N/A 11/28/2019   Procedure: CORONARY ARTERY BYPASS GRAFTING (CABG) x3, using left internal mammary artery and Saphenous vein harvested endoscopically;  Surgeon: [name], MD;  Location: [facility];  Service: Open Heart Surgery;  Laterality: N/A;  . CYSTOSCOPY WITH STENT PLACEMENT Left 11/14/2012   Procedure: CYSTOSCOPY WITH STENT PLACEMENT;  Surgeon: [name], MD;  Location: [facility];  Service: Urology;  Laterality: Left;  . CYSTOSCOPY/RETROGRADE/URETEROSCOPY/STONE EXTRACTION WITH BASKET Left 11/14/2012   Procedure: CYSTOSCOPY/RETROGRADE/URETEROSCOPY/STONE EXTRACTION WITH BASKET;  Surgeon: [name], MD;  Location: [facility];  Service: Urology;  Laterality: Left;  . EXTRACORPOREAL SHOCK WAVE LITHOTRIPSY  JULY 2000   X2  . HOLMIUM LASER APPLICATION Left 08/25/5282   Procedure: HOLMIUM LASER APPLICATION;  Surgeon: [name], MD;  Location: [facility];  Service: Urology;  Laterality: Left;  . LEFT HEART CATH AND CORONARY ANGIOGRAPHY N/A 11/27/2019   Procedure: LEFT HEART CATH AND CORONARY ANGIOGRAPHY;  Surgeon: [name], MD;  Location: [facility];  Service: Cardiovascular;  Laterality: N/A;  . LEFT URETEROSCOPIC STONE EXTRACTION  02-22-2007  . NEPHROLITHOTOMY Right 09-28-2010  percutaneous  . ORIF CLAVICLE FRACTURE  2006  . PROSTATE BIOPSY  1998   neg  . SHOULDER ARTHROSCOPY  1/15   Dr [name]  . TEE WITHOUT CARDIOVERSION N/A 11/28/2019   Procedure: TRANSESOPHAGEAL ECHOCARDIOGRAM (TEE);  Surgeon: [name], Collier Salina, MD;  Location: [facility];  Service: Open Heart Surgery;  Laterality: N/A;  . TONSILLECTOMY          Family History  Problem Relation Age of Onset  . Hypertension Mother   . Diabetes Father   . Hypertension Father   . Colon polyps Brother   . Coronary artery disease Neg Hx   . Cancer Neg Hx   . Esophageal cancer Neg Hx   . Stomach cancer Neg Hx   . Rectal cancer Neg Hx     Social History   Tobacco Use  . Smoking status: Never Smoker  . Smokeless tobacco: Never Used  Substance Use Topics  . Alcohol use: No  . Drug use: No    Home Medications Prior to Admission medications   Medication Sig Start Date End Date Taking? Authorizing Provider  alum hydroxide-mag trisilicate (GAVISCON) 81-10 MG CHEW chewable tablet Chew 2 tablets by mouth 3 (three) times daily as needed for indigestion or heartburn.    [provider]  aspirin EC 81 MG EC tablet Take 1 tablet (81 mg total) by mouth daily. 12/09/19   Elgie Collard, PA-C  carvedilol (COREG) 3.125 MG tablet Take 1 tablet (3.125 mg total) by mouth 2 (two) times daily. 05/14/20   Bensimhon, Shaune Pascal, MD  Cholecalciferol (VITAMIN D3) 250 MCG (10000 UT) TABS Take 1 tablet by mouth 2 (two) times daily.    [provider]  Cyanocobalamin (VITAMIN B-12) 1000 MCG SUBL Place under the tongue.    [provider]  ENTRESTO 24-26 MG TAKE 1 TABLET BY MOUTH 2 (TWO) TIMES DAILY. 05/26/20   Lyda Jester M, PA-C  finasteride (PROSCAR) 5 MG tablet Take 1 tablet (5 mg total) by mouth daily. 09/08/19   [name], MD  metFORMIN (GLUCOPHAGE-XR) 500 MG 24 hr tablet Take 1 tablet (500 mg total) by mouth 2 (two) times daily. 09/08/19   [name], MD  Multiple Vitamin (MULTIVITAMIN) tablet Take 1 tablet by mouth daily.    [provider]  omeprazole (PRILOSEC) 20 MG capsule Take 20 mg by mouth as needed.    [provider]  Potassium Citrate 15 MEQ (1620 MG) TBCR Take 1 tablet by mouth in the morning and at bedtime.    [provider]  rosuvastatin (CRESTOR) 10 MG tablet Take 10 mg  by mouth daily.    [provider]  spironolactone (ALDACTONE) 25 MG tablet TAKE 1 TABLET BY MOUTH EVERY DAY 05/26/20   Lyda Jester M, PA-C  timolol (TIMOPTIC) 0.5 % ophthalmic solution Place 1 drop into both eyes 2 (two) times daily.  09/24/18   [provider]    Allergies    Atorvastatin, Morphine and related, Levofloxacin, and Septra [sulfamethoxazole-trimethoprim]  Review of Systems   Review of Systems  Constitutional: Negative for chills and fever.  Musculoskeletal: Positive for arthralgias. Negative for back pain, gait problem, joint swelling and neck pain.  Skin: Positive for color change and wound.  Neurological: Negative for weakness and numbness.  Psychiatric/Behavioral: Positive for sleep disturbance.    Physical Exam Updated Vital Signs BP (!) 143/91 (BP Location: Left Arm)   Pulse 81   Temp 97.8 F (36.6 C) (Oral)   Resp  18   SpO2 99%   Physical Exam Vitals and nursing note reviewed.  Constitutional:      Appearance: He is well-developed.  HENT:     Head: Normocephalic and atraumatic.  Eyes:     Conjunctiva/sclera: Conjunctivae normal.  Pulmonary:     Effort: No respiratory distress.  Musculoskeletal:     Cervical back: Normal range of motion and neck supple.     Comments: There is a small scab over a wound at the base of the right index finger.  On the radial aspect of the dorsum of the right hand extending to the wrist there is mild erythema and warmth.  No tenderness over any of the finger joints.  Patient is tender over the base of the thumb and the wrist, especially radially.  I do not feel any effusion of the wrist.  No lymphangitis.  Patient is able to flex and extend the wrist, although slowly with some discomfort.  Skin:    General: Skin is warm and dry.  Neurological:     Mental Status: He is alert.     ED Results / Procedures / Treatments   Labs (all labs ordered are listed, but only abnormal results are displayed) Labs  Reviewed - No data to display  EKG None  Radiology DG Hand 2 View Right  Result Date: 06/11/2020 CLINICAL DATA:  Hand injury. EXAM: RIGHT HAND - 2 VIEW COMPARISON:  No recent prior. FINDINGS: Prior right third digit partial amputation. Diffuse degenerative change, most prominent about the first carpometacarpal joint. Small cyst noted the triquetrum and distal ulna most likely degenerative. No evidence of acute fracture or dislocation. Chondrocalcinosis. Tiny radiopaque foreign body noted in the soft tissues along the volar aspect of the right thumb. IMPRESSION: 1. Prior right third digit partial amputation. 2. Diffuse degenerative change, most prominent about the first carpometacarpal joint. No acute bony abnormality. 3. Tiny radiopaque foreign body noted in the soft tissues of the volar aspect of the right thumb. Electronically Signed   By: Marcello Moores  Register   On: 06/11/2020 11:18    Procedures Procedures (including critical care time)  Medications Ordered in ED Medications  cephALEXin (KEFLEX) capsule 500 mg (500 mg Oral Given 06/11/20 1153)    ED Course  I have reviewed the triage vital signs and the nursing notes.  Pertinent labs & imaging results that were available during my care of the patient were reviewed by me and considered in my medical decision making (see chart for details).  Patient seen and examined.  X-ray personally reviewed.  Foreign body likely remote and noncontributory to today's problem.  I suspect that the patient has a cellulitis in his hand and wrist.  I discussed with Dr. Tyrone Nine who will see patient.  Will order Keflex and a wrist splint.  Vital signs reviewed and are as follows: BP (!) 143/91 (BP Location: Left Arm)   Pulse 81   Temp 97.8 F (36.6 C) (Oral)   Resp 18   SpO2 99%   Seen by Dr. Tyrone Nine -- agrees with plan. Low concern for septic arthritis at this point -- however bears close watching.  Discussed with patient and wife, signs and symptoms of  septic joint and need to follow-up back in the emergency department.  This includes fever, worsening severe pain, spreading redness.  Otherwise, follow-up with PCP in 3 days for recheck.  Home on Keflex.    MDM Rules/Calculators/A&P  Patient with hand and wrist cellulitis, likely related to wound sustained at the base of the index finger.  As discussed above, low concern for septic arthritis at this point.  CLose follow-up/abx as above.    Final Clinical Impression(s) / ED Diagnoses Final diagnoses:  Cellulitis of hand    Rx / DC Orders ED Discharge Orders         Ordered    cephALEXin (KEFLEX) 500 MG capsule  3 times daily        06/11/20 1201           Carlisle Cater, PA-C 06/11/20 [place], DO 06/14/20 0700

## 2020-06-11 NOTE — ED Triage Notes (Signed)
Pt c/o right hand pain that started 2 days ago after moving some stuff. Pt states he has tried tylenol and heat/cool and nothing has relieved the pain.

## 2020-06-11 NOTE — Progress Notes (Signed)
Orthopedic Tech Progress Note Patient Details:  James Braun 03-23-1941 [id number]  Ortho Devices Type of Ortho Device: Thumb velcro splint Ortho Device/Splint Location: RUE Ortho Device/Splint Interventions: Ordered, Adjustment, Application   Post Interventions Patient Tolerated: Well Instructions Provided: Care of device   Janit Pagan 06/11/2020, 12:05 PM

## 2020-06-14 ENCOUNTER — Telehealth: Payer: Self-pay

## 2020-06-14 NOTE — Telephone Encounter (Signed)
[place] Night - Client TELEPHONE ADVICE RECORD [facility] Patient Name: James Braun Gender: Male DOB: March 22, 1941 Age: 79 Y 3 M 6 D Return Phone Number: [phone] (Primary), [phone] (Secondary) Address: City/State/Zip: [place] [address] Client [facility] Night - Client Client Site [place] Physician Viviana Simpler- MD Contact Type Call Who Is Calling Patient / Member / Family / Caregiver Call Type Triage / Clinical Relationship To Patient Self Return Phone Number [phone] (Primary) Chief Complaint Hand or Wrist Injury Reason for Call Symptomatic / Request for [place] states has injury to right hand that is painful and swelling. Translation No Nurse Assessment Nurse: Kathi Ludwig, RN, Leana Roe Date/Time Eilene Ghazi Time): 06/11/2020 8:54:51 AM Confirm and document reason for call. If symptomatic, describe symptoms. ---Caller states has injury to right hand that is painful and swelling. Taking Ibuprofen, took Oxycodone last night. painful to move. started yesterday Does the patient have any new or worsening symptoms? ---Yes Will a triage be completed? ---Yes Related visit to physician within the last 2 weeks? ---No Does the PT have any chronic conditions? (i.e. diabetes, asthma, this includes High risk factors for pregnancy, etc.) ---Yes List chronic conditions. ---DM, CABG in May Is this a behavioral health or substance abuse call? ---No Guidelines Guideline Title Affirmed Question Affirmed Notes Nurse Date/Time (Eastern Time) Hand and Wrist Injury [1] SEVERE pain AND [2] not improved 2 hours after pain medicine/ice packs Kathi Ludwig, RN, Tracie 06/11/2020 8:56:21 AM Disp. Time Eilene Ghazi Time) Disposition Final User 06/11/2020 8:59:07 AM See HCP within 4 Hours (or PCP triage) Yes Kathi Ludwig, RN, Minerva Ends Disagree/Comply Comply PLEASE NOTE: All  timestamps contained within this report are represented as [place] Standard Time. CONFIDENTIALTY NOTICE: This fax transmission is intended only for the addressee. It contains information that is legally privileged, confidential or otherwise protected from use or disclosure. If you are not the intended recipient, you are strictly prohibited from reviewing, disclosing, copying using or disseminating any of this information or taking any action in reliance on or regarding this information. If you have received this fax in error, please notify us immediately by telephone so that we can arrange for its return to [place]. Phone: [phone], Toll-Free: [phone], Fax: [phone] Page: 2 of 2 Call Id: [id number] [place] Understands Yes PreDisposition Did not know what to do Care Advice Given Per Guideline SEE HCP (OR PCP TRIAGE) WITHIN 4 HOURS: * IF OFFICE WILL BE CLOSED AND NO PCP (PRIMARY CARE PROVIDER) SECOND-LEVEL TRIAGE: You need to be seen within the next 3 or 4 hours. A nearby Urgent Care Center [facility]) is often a good source of care. Another choice is to go to the ED. Go sooner if you become worse. * IBUPROFEN (E.G., MOTRIN, ADVIL): Take 400 mg (two 200 mg pills) by mouth every 6 hours. The most you should take each day is 1,200 mg (six 200 mg pills), unless your doctor has told you to take more. PAIN MEDICINES: * For pain relief, you can take either acetaminophen, ibuprofen, or naproxen. LOCAL COLD: * Apply cold pack or an ice bag (wrapped in a moist towel). * Do this for 20 minutes out of every hour until seen. CALL BACK IF: * You become worse CARE ADVICE given per Hand and Wrist Injury (Adult) guideline. Comments User: Estevan Ryder, RN Date/Time Eilene Ghazi Time): 06/11/2020 8:56:48 AM worked outside on [facility] [place]

## 2020-06-14 NOTE — Telephone Encounter (Signed)
Spoke to pt's wife per DPR. He has an appt here 06-18-20. He would like to probably keep that appointment but will wait to see. It is doing better so far.

## 2020-06-14 NOTE — Telephone Encounter (Signed)
Please check on him If the hand is better--no further action except finishing the antibiotics Otherwise, I will find a time to see him here

## 2020-06-16 NOTE — Progress Notes (Signed)
NEUROLOGY CONSULTATION NOTE  RASHEE MARSCHALL MRN: [id number] DOB: 03/19/1941  Referring provider: Glori Bickers, MD Primary care provider: Viviana Simpler, MD  Reason for consult:  Dizziness, ataxia   Subjective:  James Braun is an 79 year old right-handed male with CAD, HTN, type 2 diabetes mellitus, HLD, and history of melanoma and kidney stones who presents for dizziness and ataxia.  History supplemented by PCP and referring provider's notes.  He is accompanied by his wife who provides collateral history.  Patient underwent CAGB in May.  Echo at that time showed LVEF 35% with mildly reduced RV systolic function and no significant valvular disease.  Postoperatively, he developed atrial fibrillation and treated with post cardiotomy shock with amiodarone.  He stopped Eliquis due to blood in stool.  Following the CABG, he started having problems with balance.  He feels unsteady on his feet and may lean to either side when walking.  No spinning or lightheaded sensation.  No lower extremity weakness or numbness.  He also reports generalized fatigue.  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  He has diabetes, which has improved from 7.5 several months ago to 6.6 about 2 months ago.  He participated in cardiac rehab and had been doing well until he had a fall in July, in which he tripped over a water hose and hit his head.  No loss of consciousness.  He sustained laceration on his scalp and mouth.  EMS arrived but declined going to the ED.  Soon afterwards , he did have an outpatient CT head on 02/12/2020 which was personally reviewed and showed no acute intracranial abnormality.  He previously went to the gym 3 times a week prior to the CABG and is trying to get back into the gym.  He had an MRI of the brain without contrast on 06/07/2020 which was personally reviewed and showed moderate chronic small vessel ischemic changes  with chronic bilateral basal ganglia lacunar infarcts but no acute intracranial abnormalities.     PAST MEDICAL HISTORY: Past Medical History:  Diagnosis Date  . BPH (benign prostatic hypertrophy)   . Cancer ([place])    melanoma  . Coronary artery disease   . History of kidney stones   . History of melanoma excision OCT 2010  . Hyperlipidemia   . Hypertension   . Left ureteral calculus   . Nephrolithiasis    bilateral  . Type 2 diabetes mellitus ([place])     PAST SURGICAL HISTORY: Past Surgical History:  Procedure Laterality Date  . CARDIAC CATHETERIZATION    . CHOLECYSTECTOMY  july 2000  . CORONARY ARTERY BYPASS GRAFT N/A 11/28/2019   Procedure: CORONARY ARTERY BYPASS GRAFTING (CABG) x3, using left internal mammary artery and Saphenous vein harvested endoscopically;  Surgeon: [name], MD;  Location: [place];  Service: Open Heart Surgery;  Laterality: N/A;  . CYSTOSCOPY WITH STENT PLACEMENT Left 11/14/2012   Procedure: CYSTOSCOPY WITH STENT PLACEMENT;  Surgeon: [name], MD;  Location: [facility];  Service: Urology;  Laterality: Left;  . CYSTOSCOPY/RETROGRADE/URETEROSCOPY/STONE EXTRACTION WITH BASKET Left 11/14/2012   Procedure: CYSTOSCOPY/RETROGRADE/URETEROSCOPY/STONE EXTRACTION WITH BASKET;  Surgeon: [name], MD;  Location: [facility];  Service: Urology;  Laterality: Left;  . EXTRACORPOREAL SHOCK WAVE LITHOTRIPSY  JULY 2000   X2  . HOLMIUM LASER APPLICATION Left 02/18/8849   Procedure: HOLMIUM LASER APPLICATION;  Surgeon: [name], MD;  Location: [facility];  Service: Urology;  Laterality: Left;  . LEFT HEART CATH AND CORONARY ANGIOGRAPHY N/A 11/27/2019   Procedure: LEFT HEART CATH AND CORONARY ANGIOGRAPHY;  Surgeon: [name], MD;  Location: [facility];  Service: Cardiovascular;  Laterality: N/A;  . LEFT URETEROSCOPIC STONE EXTRACTION  02-22-2007  . NEPHROLITHOTOMY Right 09-28-2010    percutaneous  . ORIF CLAVICLE FRACTURE  2006  . PROSTATE BIOPSY  1998   neg  . SHOULDER ARTHROSCOPY  1/15   Dr [name]  . TEE WITHOUT CARDIOVERSION N/A 11/28/2019   Procedure: TRANSESOPHAGEAL ECHOCARDIOGRAM (TEE);  Surgeon: [name], Collier Salina, MD;  Location: [place];  Service: Open Heart Surgery;  Laterality: N/A;  . TONSILLECTOMY      MEDICATIONS: Current Outpatient Medications on File Prior to Visit  Medication Sig Dispense Refill  . alum hydroxide-mag trisilicate (GAVISCON) 63-01 MG CHEW chewable tablet Chew 2 tablets by mouth 3 (three) times daily as needed for indigestion or heartburn.    Marland Kitchen aspirin EC 81 MG EC tablet Take 1 tablet (81 mg total) by mouth daily.    . carvedilol (COREG) 3.125 MG tablet Take 1 tablet (3.125 mg total) by mouth 2 (two) times daily. 60 tablet 6  . cephALEXin (KEFLEX) 500 MG capsule Take 1 capsule (500 mg total) by mouth 3 (three) times daily. 21 capsule 0  . Cholecalciferol (VITAMIN D3) 250 MCG (10000 UT) TABS Take 1 tablet by mouth 2 (two) times daily.    . Cyanocobalamin (VITAMIN B-12) 1000 MCG SUBL Place under the tongue.    Marland Kitchen ENTRESTO 24-26 MG TAKE 1 TABLET BY MOUTH 2 (TWO) TIMES DAILY. 60 tablet 3  . finasteride (PROSCAR) 5 MG tablet Take 1 tablet (5 mg total) by mouth daily. 90 tablet 3  . metFORMIN (GLUCOPHAGE-XR) 500 MG 24 hr tablet Take 1 tablet (500 mg total) by mouth 2 (two) times daily. 180 tablet 3  . Multiple Vitamin (MULTIVITAMIN) tablet Take 1 tablet by mouth daily.    Marland Kitchen omeprazole (PRILOSEC) 20 MG capsule Take 20 mg by mouth as needed.    . Potassium Citrate 15 MEQ (1620 MG) TBCR Take 1 tablet by mouth in the morning and at bedtime.    . rosuvastatin (CRESTOR) 10 MG tablet Take 10 mg by mouth daily.    Marland Kitchen spironolactone (ALDACTONE) 25 MG tablet TAKE 1 TABLET BY MOUTH EVERY DAY 30 tablet 3  . timolol (TIMOPTIC) 0.5 % ophthalmic solution Place 1 drop into both eyes 2 (two) times daily.      No current facility-administered medications on file prior  to visit.    ALLERGIES: Allergies  Allergen Reactions  . Atorvastatin Other (See Comments)    myalgias  . Morphine And Related Other (See Comments)     hypotension  . Levofloxacin Rash  . Septra [Sulfamethoxazole-Trimethoprim] Rash    FAMILY HISTORY: Family History  Problem Relation Age of Onset  . Hypertension Mother   . Diabetes Father   . Hypertension Father   . Colon polyps Brother   . Coronary artery disease Neg Hx   . Cancer Neg Hx   . Esophageal cancer Neg Hx   . Stomach cancer Neg Hx   . Rectal cancer Neg Hx     SOCIAL HISTORY: Social History   Socioeconomic History  . Marital status: Married    Spouse name: Not on file  . Number of children: 2  . Years of education: Not on file  . Highest education level: Not on file  Occupational  History  . 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 Occupation: Buys very distressed houses and rehabilitates  . Occupation: Does mediation  Tobacco Use  . Smoking status: Never Smoker  . Smokeless tobacco: Never Used  Substance and Sexual Activity  . Alcohol use: No  . Drug use: No  . Sexual activity: Not on file  Other Topics Concern  . Not on file  Social History Narrative   Has living will   Wife is health care POA---alternate is committee of physicians   Would accept resuscitation---wouldn't accept prolonged ventilation   No tube feeds if cognitively unaware   Social Determinants of Health   Financial Resource Strain:   . Difficulty of Paying Living Expenses: Not on file  Food Insecurity:   . Worried About Charity fundraiser in the Last Year: Not on file  . Ran Out of Food in the Last Year: Not on file  Transportation Needs:   . Lack of Transportation (Medical): Not on file  . Lack of Transportation (Non-Medical): Not on file  Physical Activity:   . Days of Exercise per Week: Not on file  . Minutes of Exercise per Session: Not on file  Stress:   . Feeling of Stress : Not on file  Social Connections:    . Frequency of Communication with Friends and Family: Not on file  . Frequency of Social Gatherings with Friends and Family: Not on file  . Attends Religious Services: Not on file  . Active Member of Clubs or Organizations: Not on file  . Attends Archivist Meetings: Not on file  . Marital Status: Not on file  Intimate Partner Violence:   . Fear of Current or Ex-Partner: Not on file  . Emotionally Abused: Not on file  . Physically Abused: Not on file  . Sexually Abused: Not on file    Objective:  Blood pressure 135/61, pulse 83, height 5\' 6"  (1.676 m), weight 166 lb (75.3 kg), SpO2 96 %. General: No acute distress.  Patient appears well-groomed.   Head:  Normocephalic/atraumatic Eyes:  fundi examined but not visualized Neck: supple, no paraspinal tenderness, full range of motion Back: No paraspinal tenderness Heart: regular rate and rhythm Lungs: Clear to auscultation bilaterally. Vascular: No carotid bruits. Neurological Exam: Mental status: alert and oriented to person, place, and time, recent and remote memory intact, fund of knowledge intact, attention and concentration intact, speech fluent and not dysarthric, language intact. Cranial nerves: CN I: not tested CN II: pupils equal, round and reactive to light, visual fields intact CN III, IV, VI:  full range of motion, no nystagmus, no ptosis CN V: facial sensation intact. CN VII: upper and lower face symmetric CN VIII: hearing intact CN IX, X: gag intact, uvula midline CN XI: sternocleidomastoid and trapezius muscles intact CN XII: tongue midline Bulk & Tone: normal, no fasciculations. Motor:  muscle strength 5/5 throughout Sensation:  Pinprick, temperature and vibratory sensation intact. Deep Tendon Reflexes:  2+ throughout,  toes downgoing.   Finger to nose testing:  Without dysmetria.   Heel to shin:  Without dysmetria.   Gait:  Normal station and stride.  Romberg negative.  Assessment/Plan:   Unsteady  gait.  May be a vague residual effect from a perioperative event during the CABG.  Based on MRI, I can't say for sure if a cerebrovascular event occurred at that time.  In my experience, I have seen people that never quite felt the same after a cardiac procedure.  I think  this is accentuated by his underlying neuropathy and visual impairment.    1.  I would recommend to try and get back into the gym. 2.  Continue improving glycemic control 3.  Given evidence of cerebrovascular disease on MRI, I would like to check carotid ultrasound.  Further recommendations pending results. 4.  Otherwise, follow up as needed.    Thank you for allowing me to take part in the care of this patient.  Metta Clines, DO  CC:  Viviana Simpler, MD  Glori Bickers, MD

## 2020-06-17 ENCOUNTER — Other Ambulatory Visit: Payer: Self-pay

## 2020-06-17 ENCOUNTER — Ambulatory Visit: Payer: PPO | Admitting: Neurology

## 2020-06-17 ENCOUNTER — Encounter: Payer: Self-pay | Admitting: Neurology

## 2020-06-17 VITALS — BP 135/61 | HR 83 | Ht 66.0 in | Wt 166.0 lb

## 2020-06-17 DIAGNOSIS — E1142 Type 2 diabetes mellitus with diabetic polyneuropathy: Secondary | ICD-10-CM

## 2020-06-17 DIAGNOSIS — I679 Cerebrovascular disease, unspecified: Secondary | ICD-10-CM | POA: Diagnosis not present

## 2020-06-17 DIAGNOSIS — R2681 Unsteadiness on feet: Secondary | ICD-10-CM | POA: Diagnosis not present

## 2020-06-17 NOTE — Patient Instructions (Signed)
1.  We will check carotid ultrasound 2.  Continue exercising 3.  Continue controlling diabetes

## 2020-06-18 ENCOUNTER — Ambulatory Visit (INDEPENDENT_AMBULATORY_CARE_PROVIDER_SITE_OTHER): Payer: PPO | Admitting: Internal Medicine

## 2020-06-18 ENCOUNTER — Encounter: Payer: Self-pay | Admitting: Internal Medicine

## 2020-06-18 ENCOUNTER — Other Ambulatory Visit: Payer: Self-pay

## 2020-06-18 DIAGNOSIS — L03113 Cellulitis of right upper limb: Secondary | ICD-10-CM | POA: Diagnosis not present

## 2020-06-18 MED ORDER — CEPHALEXIN 500 MG PO CAPS: 500.0000 mg | ORAL_CAPSULE | Freq: Three times a day (TID) | ORAL | 0 refills | Status: DC

## 2020-06-18 NOTE — Progress Notes (Signed)
Subjective:    Patient ID: James Braun, male    DOB: 04/22/1941, 79 y.o.   MRN: [id number]  HPI Here for ER follow up of cellulitis of finger This visit occurred during the SARS-CoV-2 public health emergency.  Safety protocols were in place, including screening questions prior to the visit, additional usage of staff PPE, and extensive cleaning of exam room while observing appropriate contact time as indicated for disinfecting solutions.   Woke on Thanksgiving with some right hand pain Became severe so went to the ER Might have been from a wound at the base of his 2nd finger Seen in ER--diagnosed with cellulitis Given cephalexin --started with "double dose"  Wore brace for a while---now at night Ibuprofen prn for pain Just finished the cephalexin---better but still some swelling and pain  Current Outpatient Medications on File Prior to Visit  Medication Sig Dispense Refill  . alum hydroxide-mag trisilicate (GAVISCON) 08-65 MG CHEW chewable tablet Chew 2 tablets by mouth 3 (three) times daily as needed for indigestion or heartburn.    Marland Kitchen aspirin EC 81 MG EC tablet Take 1 tablet (81 mg total) by mouth daily.    . carvedilol (COREG) 3.125 MG tablet Take 1 tablet (3.125 mg total) by mouth 2 (two) times daily. 60 tablet 6  . Cholecalciferol (VITAMIN D3) 250 MCG (10000 UT) TABS Take 1 tablet by mouth 2 (two) times daily.    . Cyanocobalamin (VITAMIN B-12) 1000 MCG SUBL Place under the tongue.    Marland Kitchen ENTRESTO 24-26 MG TAKE 1 TABLET BY MOUTH 2 (TWO) TIMES DAILY. 60 tablet 3  . finasteride (PROSCAR) 5 MG tablet Take 1 tablet (5 mg total) by mouth daily. 90 tablet 3  . metFORMIN (GLUCOPHAGE-XR) 500 MG 24 hr tablet Take 1 tablet (500 mg total) by mouth 2 (two) times daily. 180 tablet 3  . Multiple Vitamin (MULTIVITAMIN) tablet Take 1 tablet by mouth daily.    Marland Kitchen omeprazole (PRILOSEC) 20 MG capsule Take 20 mg by mouth as needed.    . Potassium Citrate 15 MEQ (1620 MG) TBCR Take 1 tablet by mouth in  the morning and at bedtime.    . rosuvastatin (CRESTOR) 10 MG tablet Take 10 mg by mouth daily.    Marland Kitchen spironolactone (ALDACTONE) 25 MG tablet TAKE 1 TABLET BY MOUTH EVERY DAY 30 tablet 3  . timolol (TIMOPTIC) 0.5 % ophthalmic solution Place 1 drop into both eyes 2 (two) times daily.      No current facility-administered medications on file prior to visit.    Allergies  Allergen Reactions  . Atorvastatin Other (See Comments)    myalgias  . Morphine And Related Other (See Comments)     hypotension  . Levofloxacin Rash  . Septra [Sulfamethoxazole-Trimethoprim] Rash    Past Medical History:  Diagnosis Date  . BPH (benign prostatic hypertrophy)   . Cancer ([place])    melanoma  . Coronary artery disease   . History of kidney stones   . History of melanoma excision OCT 2010  . Hyperlipidemia   . Hypertension   . Left ureteral calculus   . Nephrolithiasis    bilateral  . Type 2 diabetes mellitus ([place])     Past Surgical History:  Procedure Laterality Date  . CARDIAC CATHETERIZATION    . CHOLECYSTECTOMY  july 2000  . CORONARY ARTERY BYPASS GRAFT N/A 11/28/2019   Procedure: CORONARY ARTERY BYPASS GRAFTING (CABG) x3, using left internal mammary artery and Saphenous vein harvested endoscopically;  Surgeon: [name],  Collier Salina, MD;  Location: [place];  Service: Open Heart Surgery;  Laterality: N/A;  . CYSTOSCOPY WITH STENT PLACEMENT Left 11/14/2012   Procedure: CYSTOSCOPY WITH STENT PLACEMENT;  Surgeon: [name], MD;  Location: [facility];  Service: Urology;  Laterality: Left;  . CYSTOSCOPY/RETROGRADE/URETEROSCOPY/STONE EXTRACTION WITH BASKET Left 11/14/2012   Procedure: CYSTOSCOPY/RETROGRADE/URETEROSCOPY/STONE EXTRACTION WITH BASKET;  Surgeon: [name], MD;  Location: [facility];  Service: Urology;  Laterality: Left;  . EXTRACORPOREAL SHOCK WAVE LITHOTRIPSY  JULY 2000   X2  . HOLMIUM LASER APPLICATION Left 0/12/2692   Procedure: HOLMIUM LASER  APPLICATION;  Surgeon: [name], MD;  Location: [facility];  Service: Urology;  Laterality: Left;  . LEFT HEART CATH AND CORONARY ANGIOGRAPHY N/A 11/27/2019   Procedure: LEFT HEART CATH AND CORONARY ANGIOGRAPHY;  Surgeon: [name], MD;  Location: [facility];  Service: Cardiovascular;  Laterality: N/A;  . LEFT URETEROSCOPIC STONE EXTRACTION  02-22-2007  . NEPHROLITHOTOMY Right 09-28-2010   percutaneous  . ORIF CLAVICLE FRACTURE  2006  . PROSTATE BIOPSY  1998   neg  . SHOULDER ARTHROSCOPY  1/15   Dr [name]  . TEE WITHOUT CARDIOVERSION N/A 11/28/2019   Procedure: TRANSESOPHAGEAL ECHOCARDIOGRAM (TEE);  Surgeon: [name], Collier Salina, MD;  Location: [place];  Service: Open Heart Surgery;  Laterality: N/A;  . TONSILLECTOMY      Family History  Problem Relation Age of Onset  . Hypertension Mother   . Diabetes Father   . Hypertension Father   . Colon polyps Brother   . Coronary artery disease Neg Hx   . Cancer Neg Hx   . Esophageal cancer Neg Hx   . Stomach cancer Neg Hx   . Rectal cancer Neg Hx     Social History   Socioeconomic History  . Marital status: Married    Spouse name: Not on file  . Number of children: 2  . Years of education: Not on file  . Highest education level: Not on file  Occupational History  . 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 Occupation: Buys very distressed houses and rehabilitates  . Occupation: Does mediation  Tobacco Use  . Smoking status: Never Smoker  . Smokeless tobacco: Never Used  Substance and Sexual Activity  . Alcohol use: No  . Drug use: No  . Sexual activity: Not on file  Other Topics Concern  . Not on file  Social History Narrative   Has living will   Wife is health care POA---alternate is committee of physicians   Would accept resuscitation---wouldn't accept prolonged ventilation   No tube feeds if cognitively unaware   Right handed   Social Determinants of Health   Financial Resource  Strain:   . Difficulty of Paying Living Expenses: Not on file  Food Insecurity:   . Worried About Charity fundraiser in the Last Year: Not on file  . Ran Out of Food in the Last Year: Not on file  Transportation Needs:   . Lack of Transportation (Medical): Not on file  . Lack of Transportation (Non-Medical): Not on file  Physical Activity:   . Days of Exercise per Week: Not on file  . Minutes of Exercise per Session: Not on file  Stress:   . Feeling of Stress : Not on file  Social Connections:   . Frequency of Communication with Friends and Family: Not on file  . Frequency of Social Gatherings with Friends and Family: Not on file  .  Attends Religious Services: Not on file  . Active Member of Clubs or Organizations: Not on file  . Attends Archivist Meetings: Not on file  . Marital Status: Not on file  Intimate Partner Violence:   . Fear of Current or Ex-Partner: Not on file  . Emotionally Abused: Not on file  . Physically Abused: Not on file  . Sexually Abused: Not on file   Review of Systems  No fever Just had neurology visit---no action needed     Objective:   Physical Exam Constitutional:      Appearance: Normal appearance.  Skin:    Comments: Scabbed lesion at base of right 2nd MCP. No redness in hand/finger but still some puffiness in hand to wrist No active synovitis  Neurological:     Mental Status: He is alert.            Assessment & Plan:

## 2020-06-18 NOTE — Assessment & Plan Note (Signed)
Improved Not red but still puffy and he still has some pain Will extend the cephalexin an additional 3 days

## 2020-06-21 DIAGNOSIS — H25813 Combined forms of age-related cataract, bilateral: Secondary | ICD-10-CM | POA: Diagnosis not present

## 2020-06-21 DIAGNOSIS — H18513 Endothelial corneal dystrophy, bilateral: Secondary | ICD-10-CM | POA: Diagnosis not present

## 2020-06-21 DIAGNOSIS — H35371 Puckering of macula, right eye: Secondary | ICD-10-CM | POA: Diagnosis not present

## 2020-06-24 ENCOUNTER — Encounter: Payer: Self-pay | Admitting: Podiatry

## 2020-06-24 ENCOUNTER — Other Ambulatory Visit: Payer: Self-pay

## 2020-06-24 ENCOUNTER — Ambulatory Visit: Payer: PPO | Admitting: Podiatry

## 2020-06-24 DIAGNOSIS — B351 Tinea unguium: Secondary | ICD-10-CM

## 2020-06-24 DIAGNOSIS — N1831 Chronic kidney disease, stage 3a: Secondary | ICD-10-CM | POA: Diagnosis not present

## 2020-06-24 DIAGNOSIS — M79676 Pain in unspecified toe(s): Secondary | ICD-10-CM

## 2020-06-24 DIAGNOSIS — Q828 Other specified congenital malformations of skin: Secondary | ICD-10-CM

## 2020-06-24 DIAGNOSIS — E1142 Type 2 diabetes mellitus with diabetic polyneuropathy: Secondary | ICD-10-CM | POA: Diagnosis not present

## 2020-06-24 NOTE — Progress Notes (Signed)
This patient returns to my office for at risk foot care.  This patient requires this care by a professional since this patient will be at risk due to having  Diabetes and CKD.    This patient is unable to cut nails himself since the patient cannot reach his nails.These nails are painful walking and wearing shoes.  This patient presents for at risk foot care today.  General Appearance  Alert, conversant and in no acute stress.  Vascular  Dorsalis pedis and posterior tibial  pulses are palpable  bilaterally.  Capillary return is within normal limits  bilaterally. Temperature is within normal limits  bilaterally.  Neurologic  Senn-Weinstein monofilament wire test diminished  bilaterally. Muscle power within normal limits bilaterally.  Nails Thick disfigured discolored nails with subungual debris  from hallux to fifth toes bilaterally. No evidence of bacterial infection or drainage bilaterally.  Orthopedic  No limitations of motion  feet .  No crepitus or effusions noted.  No bony pathology or digital deformities noted.  Skin  normotropic skin with no porokeratosis noted bilaterally.  No signs of infections or ulcers noted.   Porokeratosis sub 5th  B/L and left heel.  Onychomycosis  Pain in right toes  Pain in left toes  Porokeratosis  B/L.  Consent was obtained for treatment procedures.   Mechanical debridement of nails 1-5  bilaterally performed with a nail nipper.  Filed with dremel without incident. No infection or ulcer.  Debride callus with # 15 blade.   Return office visit  12 weeks                Told patient to return for periodic foot care and evaluation due to potential at risk complications.   Gardiner Barefoot DPM

## 2020-06-29 ENCOUNTER — Ambulatory Visit (HOSPITAL_COMMUNITY)
Admission: RE | Admit: 2020-06-29 | Discharge: 2020-06-29 | Disposition: A | Discharge status: Home / Self Care | Payer: PPO | Source: Ambulatory Visit | Attending: Cardiology | Admitting: Cardiology

## 2020-06-29 ENCOUNTER — Other Ambulatory Visit: Payer: Self-pay

## 2020-06-29 DIAGNOSIS — I679 Cerebrovascular disease, unspecified: Secondary | ICD-10-CM | POA: Insufficient documentation

## 2020-06-30 DIAGNOSIS — H401122 Primary open-angle glaucoma, left eye, moderate stage: Secondary | ICD-10-CM | POA: Diagnosis not present

## 2020-07-05 ENCOUNTER — Telehealth: Payer: Self-pay | Admitting: Internal Medicine

## 2020-07-05 NOTE — Telephone Encounter (Signed)
He really should get that from the cardiologist--but if he has any problems, I would refill it

## 2020-07-05 NOTE — Telephone Encounter (Addendum)
Spoke to pt. Advised him what Dr [name] said. I have sent the message to Dr Missy Sabins, also. He is going to call his office.

## 2020-07-05 NOTE — Telephone Encounter (Signed)
Patient called into office and stating he is needing to have a 90 day supply of Entresto sent in to [facility]. Please advise.

## 2020-07-05 NOTE — Telephone Encounter (Signed)
We did not start the medication. Will forward to Dr [name] to approve.

## 2020-07-06 ENCOUNTER — Telehealth (HOSPITAL_COMMUNITY): Payer: Self-pay | Admitting: Pharmacist

## 2020-07-06 MED ORDER — ENTRESTO 24-26 MG PO TABS
1.0000 | ORAL_TABLET | Freq: Two times a day (BID) | ORAL | 3 refills | Status: DC
Start: 2020-07-06 — End: 2021-05-13

## 2020-07-06 NOTE — Telephone Encounter (Signed)
Refill of Entresto sent to [facility] order pharmacy per patient request.   Audry Riles, PharmD, BCPS, BCCP, CPP Heart Failure Clinic Pharmacist [phone]

## 2020-07-07 ENCOUNTER — Other Ambulatory Visit: Payer: Self-pay | Admitting: Physician Assistant

## 2020-07-10 NOTE — Telephone Encounter (Signed)
Nira Conn - can we help them with this? Thanks

## 2020-07-12 ENCOUNTER — Telehealth: Payer: Self-pay

## 2020-07-12 NOTE — Chronic Care Management (AMB) (Addendum)
Chronic Care Management Pharmacy Assistant   Name: James Braun  MRN: [id number] DOB: Oct 13, 1940  Reason for Encounter: Initial Questions for CCM visit    PCP : James Carbon, MD  Allergies:   Allergies  Allergen Reactions   Atorvastatin Other (See Comments)    myalgias   Morphine And Related Other (See Comments)     hypotension   Levofloxacin Rash   Septra [Sulfamethoxazole-Trimethoprim] Rash   Sulfamethoxazole-Trimethoprim Rash    Medications: Outpatient Encounter Medications as of 07/12/2020  Medication Sig   alum hydroxide-mag trisilicate (GAVISCON) [id number] MG CHEW chewable tablet Chew 2 tablets by mouth 3 (three) times daily as needed for indigestion or heartburn.   aspirin EC 81 MG EC tablet Take 1 tablet (81 mg total) by mouth daily.   carvedilol (COREG) 3.125 MG tablet Take 1 tablet (3.125 mg total) by mouth 2 (two) times daily.   cephALEXin (KEFLEX) 500 MG capsule Take 1 capsule (500 mg total) by mouth 3 (three) times daily.   Cholecalciferol (VITAMIN D3) 250 MCG (10000 UT) TABS Take 1 tablet by mouth 2 (two) times daily.   Cyanocobalamin (VITAMIN B-12) 1000 MCG SUBL Place under the tongue.   finasteride (PROSCAR) 5 MG tablet Take 1 tablet (5 mg total) by mouth daily.   metFORMIN (GLUCOPHAGE-XR) 500 MG 24 hr tablet Take 1 tablet (500 mg total) by mouth 2 (two) times daily.   Multiple Vitamin (MULTIVITAMIN) tablet Take 1 tablet by mouth daily.   omeprazole (PRILOSEC) 20 MG capsule Take 20 mg by mouth as needed.   Potassium Citrate 15 MEQ (1620 MG) TBCR Take 1 tablet by mouth in the morning and at bedtime.   rosuvastatin (CRESTOR) 10 MG tablet Take 10 mg by mouth daily.   sacubitril-valsartan (ENTRESTO) 24-26 MG Take 1 tablet by mouth 2 (two) times daily.   spironolactone (ALDACTONE) 25 MG tablet TAKE 1 TABLET BY MOUTH EVERY DAY   timolol (TIMOPTIC) 0.5 % ophthalmic solution Place 1 drop into both eyes 2 (two) times daily.    No facility-administered encounter  medications on file as of 07/12/2020.    Current Diagnosis: Patient Active Problem List   Diagnosis Date Noted   Cellulitis of right hand 06/18/2020   Ataxia 05/19/2020   Statin myopathy 02/10/2020   Heart failure, systolic, chronic ([place]) [id number]   PAF (paroxysmal atrial fibrillation) ([place]) 12/03/2019   S/P CABG x 3 11/28/2019   CAD in native artery 11/27/2019   GERD (gastroesophageal reflux disease) 06/30/2019   Anemia in chronic kidney disease (CKD) 09/05/2018   Chronic renal disease, stage III ([place]) 03/06/2018   Counseling regarding advanced directives 02/13/2014   Onychomycosis 04/25/2013   Routine general medical examination at a health care facility 01/04/2011   BPH without obstruction/lower urinary tract symptoms 07/13/2010   Controlled type 2 diabetes mellitus with diabetic nephropathy ([place]) 01/11/2010   Hyperlipemia 01/11/2010   Essential hypertension, benign 01/11/2010   NEPHROLITHIASIS, HX OF 01/11/2010   Have you seen any other providers since your last visit? Yes 06/24/20 Dr. Gardiner Barefoot - Podiatry.   Any changes in your medications or health? No  Any side effects from any medications?  Possibly Crestor making muscles sore.  Do you have an symptoms or problems not managed by your medications? No  Any concerns about your health right now? Muscle aches and dizziness.   Has your provider asked that you check blood pressure, blood sugar, or follow special diet at home? Yes- low salt diet.   Do  you get any type of exercise on a regular basis?  Exercises 3 times a week at the gym.   Can you think of a goal you would like to reach for your health? "would like to live another 10 years"  Do you have any problems getting your medications? Entresto - $45 a month from mail order. Was getting from [facility] but price went up to $100 a month. States he is ok with this. Possibly interested in patient assistance if he would qualify.   Is there anything that you would like to  discuss during the appointment?  Crestor - sore and muscle aches. Balance issues. Able to drive/sit without issues but dizzy sometimes when walking  Patient reminded to have all medications and supplements available for review with James Braun, Pharm. D, at their telephone visit on 07/19/2020.  Follow-Up:  Pharmacist Review   James Braun, CPP notified  James Braun, [facility] Clinical Pharmacy Assistant [phone]

## 2020-07-16 ENCOUNTER — Other Ambulatory Visit: Payer: Self-pay | Admitting: Physician Assistant

## 2020-07-17 ENCOUNTER — Other Ambulatory Visit: Payer: Self-pay | Admitting: Internal Medicine

## 2020-07-19 ENCOUNTER — Telehealth: Payer: PPO

## 2020-07-27 ENCOUNTER — Telehealth: Payer: Self-pay | Admitting: Internal Medicine

## 2020-07-27 NOTE — Telephone Encounter (Signed)
Pt called to schedule lab appointment for 2/22. He is requesting to have labs drawn prior to his follow up due to him wanting to also take the results to Dr. Haroldine Laws. He states he is wanting a full panel drawn as well as his A1C. Is this something that we can accommodate? I did advise patient that we are unable to draw labs for offices outside of [place], but he states that these are for him and he is just going to share the information with Dr. Haroldine Laws when he sees him on 2/28.

## 2020-07-28 NOTE — Telephone Encounter (Signed)
Spoke to pt. He said he is not in agreement but will do as Dr [name] has said.

## 2020-07-28 NOTE — Telephone Encounter (Signed)
He just had all that done in October It doesn't need to be repeated yet A PSA is not appropriate for an 80 year old (we generally stop checking at age 24)

## 2020-07-28 NOTE — Telephone Encounter (Signed)
Spoke to pt. Asked him what labs he is asking to have done. He said whatever a cardiologist would want to have done. A metabolic test, E4M, PSA is Dr [name] would agree. "Typical cardiologist interest and A1C and PSA for him.

## 2020-08-08 ENCOUNTER — Other Ambulatory Visit: Payer: Self-pay | Admitting: Cardiothoracic Surgery

## 2020-08-11 ENCOUNTER — Other Ambulatory Visit: Payer: Self-pay | Admitting: Cardiothoracic Surgery

## 2020-08-17 NOTE — Chronic Care Management (AMB) (Signed)
Chronic Care Management Pharmacy  Name: James Braun  MRN: [id number] DOB: 21-Dec-1940   Initial Questions  Chief Complaint/ HPI  James Braun,  80 y.o. , male presents for their Initial CCM visit with the clinical pharmacist via telephone.  PCP : [name], MD  Their chronic conditions include: HTN, CAD, CHF, PAF, GERD, DM, BPH, CKD (stage 3), HLD, anemia in CKD  Pertinent history: kidney stones, CABG May 2021  Patient concerns: Main concern imbalance/dizziness since heart surgery May 2021. Reports this is getting better daily. He suddenly needs to lean left or right to maintain balance. Also reports right eye is perpetually out of focus so he believes this impacts balance. Hoping if he gets this fixed that will help with balance. He has eye procedure scheduled for October 12, 2020. Reports right wrist cellulitis has healed.   07/12/20 - CCM consent  Office Visits: 06/18/20 - PCP visit, cellulitis right hand, Improved, Not red but still puffy and he still has some pain. Will extend the cephalexin an additional 3 days. 03/24/20 - PCP visit, Will need another colonoscopy due to anemia and past polyps, continue current meds  01/16/20 - PCP visit, GI bleed f/u, AFIB, Post op without apparent recurrence, On ASA only, Off amio. GI bleed, unclear etiology, Will continue the PPI for 3 months 12/16/19 - PCP visit - GI bleed, stop Eliquis, continue asa 81, start omeprazole 20 mg daily  Consult Visit: 06/29/20 - Carotid ultrasound 06/24/20 - Podiatry 06/21/20 - Ophthalmology  06/17/20 - Neurology - Unsteady gait.  May be a vague residual effect from a perioperative event during the CABG.  Based on MRI, I can't say for sure if a cerebrovascular event occurred at that time.  In my experience, I have seen people that never quite felt the same after a cardiac procedure.  I think this is accentuated by his underlying neuropathy and visual impairment.   I would recommend to try and get back into the  gym, glycemic control. Given evidence of cerebrovascular disease on MRI, I would like to check carotid ultrasound.  Further recommendations pending results. 06/11/20 - ED cellulitis right hand  05/19/20 - Echocardiogram 05/14/20 - Cardiology - Start Carvedilol 3.125 mg  2 times a day. Refer to neurology. Order echo. 04/22/20 - Gastro - IDA, endoscopy - Diverticulosis in the left colon. IDA most likely from intermittent gastric erosions due to large hiatal hernia seen on EGD earlier this year. Follow hemoglobin regularly with primary care and take iron supplement as needed.  03/30/20 - Cardiology - Called pt to follow up with lipids. He states he started on rosuvastatin 52m daily after his labs were checked on 9/1. He states Dr LSilvio Patewill be following his lipids from now on and plans to recheck labs within the next few months.  Allergies  Allergen Reactions  . Atorvastatin Other (See Comments)    myalgias  . Morphine And Related Other (See Comments)     hypotension  . Levofloxacin Rash  . Septra [Sulfamethoxazole-Trimethoprim] Rash  . Sulfamethoxazole-Trimethoprim Rash   Medications: Outpatient Encounter Medications as of 08/20/2020  Medication Sig  . alum hydroxide-mag trisilicate (GAVISCON) 809-38MG CHEW chewable tablet Chew 2 tablets by mouth 3 (three) times daily as needed for indigestion or heartburn.  .Marland Kitchenaspirin EC 81 MG EC tablet Take 1 tablet (81 mg total) by mouth daily.  . carvedilol (COREG) 3.125 MG tablet Take 1 tablet (3.125 mg total) by mouth 2 (two) times daily.  .Marland Kitchen  Cyanocobalamin (VITAMIN B-12) 1000 MCG SUBL Place 2,000 mcg under the tongue. 1 BID  . ferrous sulfate 325 (65 FE) MG tablet Take 325 mg by mouth daily with breakfast.  . finasteride (PROSCAR) 5 MG tablet Take 1 tablet (5 mg total) by mouth daily.  . furosemide (LASIX) 20 MG tablet TAKE 1 TABLET (20 MG TOTAL) BY MOUTH DAILY. DAILY, AS NEEDED FOR LOWER LEG SWELLING.  . metFORMIN (GLUCOPHAGE-XR) 500 MG 24 hr tablet Take  1 tablet (500 mg total) by mouth 2 (two) times daily.  . Multiple Vitamin (MULTIVITAMIN) tablet Take 1 tablet by mouth daily.  Marland Kitchen omeprazole (PRILOSEC) 20 MG capsule Take 20 mg by mouth as needed.  . Potassium Citrate 15 MEQ (1620 MG) TBCR TAKE 1 TABLET BY MOUTH 3 TIMES A DAY (Patient taking differently: Taking BID as of 08/20/20. Reports instructed to decrease from TID to BID.)  . rosuvastatin (CRESTOR) 10 MG tablet Take 10 mg by mouth daily.  . sacubitril-valsartan (ENTRESTO) 24-26 MG Take 1 tablet by mouth 2 (two) times daily.  Marland Kitchen spironolactone (ALDACTONE) 25 MG tablet TAKE 1 TABLET BY MOUTH EVERY DAY  . timolol (TIMOPTIC) 0.5 % ophthalmic solution Place 1 drop into both eyes 2 (two) times daily.   . Vitamin D3 (VITAMIN D) 25 MCG tablet Take 1 tablet by mouth 2 (two) times daily.  . [DISCONTINUED] cephALEXin (KEFLEX) 500 MG capsule Take 1 capsule (500 mg total) by mouth 3 (three) times daily.   No facility-administered encounter medications on file as of 08/20/2020.   Current Diagnosis/Assessment:  SDOH Interventions   Flowsheet Row Most Recent Value  SDOH Interventions   Financial Strain Interventions Intervention Not Indicated  [Medications affordable]      Goals Addressed            This Visit's Progress   . Pharmacy Care Plan       CARE PLAN ENTRY (see longitudinal plan of care for additional care plan information)  Current Barriers:  . Chronic Disease Management support, education, and care coordination needs related to Hypertension and Diabetes   Hypertension BP Readings from Last 3 Encounters:  06/18/20 118/68  06/17/20 135/61  06/11/20 (!) 146/95   . Pharmacist Clinical Goal(s): o Over the next 6 months, patient will work with PharmD and providers to maintain BP goal <130/80 . Current regimen:   Carvedilol 3.125 mg - 1 tablet twice daily with meals  Spironolactone 25 mg - 1 tablet daily  . Interventions: o Reviewed home blood pressure log - usually less than  120/80. Denies any low blood pressure < 90/60. o Reviewed adherence - carvedilol past due for refill but patient has plenty on hand and confirms taking twice daily  . Patient self care activities - Over the next 6 months, patient will: o Check BP once monthly, document, and provide at future appointments  Diabetes Lab Results  Component Value Date/Time   HGBA1C 6.6 (H) 04/23/2020 09:04 AM   HGBA1C 7.2 (H) 11/27/2019 06:47 PM   . Pharmacist Clinical Goal(s): o Over the next 6 months, patient will work with PharmD and providers to maintain A1c goal <7% . Current regimen:  o Metformin ER 500 mg - 1 tablet twice daily . Interventions: o Reviewed medication adherence and tolerance o Discussed vitamin B12 administration - recommend reducing to once daily due to last level > 1500 10/2019.  Marland Kitchen Patient self care activities - Over the next 6 months, patient will: o Check blood sugar with symptoms of hypoglycemia, document, and  provide at future appointments o Contact provider with any episodes of hypoglycemia  Initial goal documentation      Hypertension   CMP Latest Ref Rng & Units 04/23/2020 03/17/2020 01/16/2020  Glucose 70 - 99 mg/dL 125(H) 141(H) 155(H)  BUN 6 - 23 mg/dL 26(H) 25(H) 30(H)  Creatinine 0.40 - 1.50 mg/dL 1.53(H) 1.31 1.30  Sodium 135 - 145 mEq/L 144 138 140  Potassium 3.5 - 5.1 mEq/L 3.6 4.5 4.3  Chloride 96 - 112 mEq/L 109 103 104  CO2 19 - 32 mEq/L '29 28 27  ' Calcium 8.4 - 10.5 mg/dL 9.4 9.3 9.3  Total Protein 6.0 - 8.3 g/dL 7.0 6.5 6.7  Total Bilirubin 0.2 - 1.2 mg/dL 0.3 0.5 0.4  Alkaline Phos 39 - 117 U/L 41 49 57  AST 0 - 37 U/L '22 15 18  ' ALT 0 - 53 U/L '16 13 19   ' Office blood pressures are: BP Readings from Last 3 Encounters:  06/18/20 118/68  06/17/20 135/61  06/11/20 (!) 146/95   BP goal < 130/80 mmHg Patient has failed these meds in the past: reports meds changed once he was diagnosed with HF Patient checks BP at home when feeling symptomatic - about  once a month Patient home BP readings are ranging: 120/80, SBP never above 130. Denies any BP < 90/60.   Patient is currently controlled on the following medications:   Carvedilol 3.125 mg - 1 tablet twice daily with meals  Spironolactone 25 mg - 1 tablet daily   We discussed:  Denies any orthostasis, but reports he does wait a few minutes after standing before walking to prevent falls.   Plan: Continue current medications  Hyperlipidemia   LDL goal < 70 (CAD)  Last lipids Lab Results  Component Value Date   CHOL 133 04/23/2020   HDL 50.10 04/23/2020   LDLCALC 67 04/23/2020   LDLDIRECT 141.5 02/03/2013   TRIG 82.0 04/23/2020   CHOLHDL 3 04/23/2020   Hepatic Function Latest Ref Rng & Units 04/23/2020 03/17/2020 03/17/2020  Total Protein 6.0 - 8.3 g/dL 7.0 6.5 -  Albumin 3.5 - 5.2 g/dL 4.4 4.4 4.4  AST 0 - 37 U/L 22 15 -  ALT 0 - 53 U/L 16 13 -  Alk Phosphatase 39 - 117 U/L 41 49 -  Total Bilirubin 0.2 - 1.2 mg/dL 0.3 0.5 -  Bilirubin, Direct 0.0 - 0.3 mg/dL - 0.1 -    The ASCVD Risk score Mikey Bussing DC Jr., et al., 2013) failed to calculate for the following reasons:   The 2013 ASCVD risk score is only valid for ages 32 to 30   Patient has failed these meds in past: none  Patient is currently controlled on the following medications:  . Rosuvastatin 10 mg - 1 tablet daily . Aspirin 81 mg - 1 tablet daily   We discussed: denies any blood in urine, stool or nose bleeds. Denies other bruising or bleeding. Pt reports his hemoglobin was down so he started iron tablets on his own. He had a GI bleed in 2021 while on Eliquis. We discussed his CBC as pt is concerned about his hemoglobin. He reports taking Iron 65 mg (325 mg ferrous sulfate) - 1 tablet daily. Agree he should continue this considering last iron panel was low. Recommend re-evaluate iron panel with next labs.   CBC Latest Ref Rng & Units 03/17/2020 01/16/2020 12/16/2019  WBC 4.0 - 10.5 K/uL 5.8 6.3 8.0  Hemoglobin 13.0 - 17.0 g/dL  12.0(L) 10.4(L)  11.4(L)  Hematocrit 39.0 - 52.0 % 37.5(L) 32.4(L) 34.9(L)  Platelets 150.0 - 400.0 K/uL 377.0 439.0(H) 775.0(H)   Iron/TIBC/Ferritin/ %Sat    Component Value Date/Time   IRON 20 (L) 11/13/2019 0803   IRON 20 (L) 11/13/2019 0803   FERRITIN 5.8 (L) 11/13/2019 0803   IRONPCTSAT 4.5 (L) 11/13/2019 0803   Plan: Continue current medications; Add iron to medication list. Recommend repeat iron panel with next labs.  AFIB   Patient is currently rate controlled. Last cardio appt 03/2020 Patient has failed these meds in past: amiodarone, Eliquis  Patient is currently on the following medications:   Carvedilol 3.125 mg - 1 BID  Aspirin 81 mg - 1 tablet daily  Pt has h/o GI bleed on Eliquis, not on anticoagulant.   Plan: Continue current medications  Diabetes   Recent Relevant Labs: Lab Results  Component Value Date/Time   HGBA1C 6.6 (H) 04/23/2020 09:04 AM   HGBA1C 7.2 (H) 11/27/2019 06:47 PM   MICROALBUR 12.0 (H) 02/15/2015 02:33 PM   MICROALBUR 8.8 (H) 12/25/2011 10:25 AM    A1c goal < 7% Checking BG: if symptoms of low BG, denies any recently Patient has failed these meds in past: none  Patient is currently controlled on the following medications:   Metformin ER 500 mg - 1 tablet twice daily  Last diabetic eye exam:  Lab Results  Component Value Date/Time   HMDIABEYEEXA No Retinopathy 03/26/2020 12:00 AM    Last diabetic foot exam: up to date - Patient checks his feet daily, reports they look good. He goes to podiatry every 3 months. Reviewed adherence and tolerance - no concerns. Refills timely.  Plan: Continue current medications  Heart Failure   Type: Systolic  Last ejection fraction: 05/2020 45-50%  11/2019 35-40% NYHA Class: II (slight limitation of activity) AHA HF Stage: C (Heart disease and symptoms present)  Patient has failed these meds in past: none  Patient is currently controlled on the following medications:   Lasix 20 mg - 1  tablet daily PRN  Potassium Citrate Er 15 mEq - 1 tablet TID (Pt taking BID)  Entresto 24/26 mg - 1 tablet BID   Carvedilol 3.125 mg - 1 tablet twice daily with meals  Spironolactone 25 mg - 1 tablet daily   We discussed: Adherence - confirms daily adherence to medications. Pt reports he was told to cut back on potassium citrate to BID several months ago. Took Lasix daily for a few weeks due to his weight gain of 5 lbs last year but did not notice any change in weight. Reports he think he gained weight due to less activity. Currently walking 15 minutes twice daily. Checks weight most mornings looking for weight change of 3 lbs. He only takes Lasix if weight gain > 3 lbs overnight. Denies any change in SOB or swelling this year.   Plan: Continue current medications; Recommend weighing daily.  BPH   Patient has failed these meds in past: none Patient is currently controlled on the following medications:  . Finasteride 5 mg - 1 tablet daily  We discussed: pt reports finasteride works very well, symptoms controlled  Plan: Continue current medications  Medication Management   Misc: Simbrinza eye drops Timolol eye drops [id number] eye drops   OTCs: Vitamin D3 (1000) 25 mcg units - 1 BID Vitamin B12 2000 SL - 1 BID Gaviscon 80-20 mg chew tablets - takes about once a week at night for residual acid reflux  Omeprazole 20 mg capsule -  takes 1 a day before dinner PRN for reflux. He completed 3 months daily dosing after GI bleed. Multivitamin (Centurm Silver)  Plan: B12 > 1500 11/13/19 --> recommend cut back to once daily. Pt would like his B12 checked with next labs  Patient's preferred pharmacy is:  [facility]) - [address], [address] [place] [address] Phone: [phone] Fax: [phone]  [id number] [place]Phone: [phone]:  [phone] Uses pill box? Yes Pt endorses % compliance   We discussed: Benefits of medication synchronization, packaging and delivery as well as enhanced pharmacist oversight with [employer]. [facility] at [facility] except metformin and finasteride come through mail order. Refills timely except carvedilol - last filled 05/14/20 30 DS. Pt reports he has Coreg on hand and takes BID. Meds are not synced. He would like to continue current medication management strategy.  Plan  Continue current medication management strategy  Follow up: 6 month phone visit  MDebbora Dus PharmD Clinical Pharmacist [phone]

## 2020-08-20 ENCOUNTER — Other Ambulatory Visit: Payer: Self-pay

## 2020-08-20 ENCOUNTER — Ambulatory Visit (INDEPENDENT_AMBULATORY_CARE_PROVIDER_SITE_OTHER): Payer: PPO

## 2020-08-20 DIAGNOSIS — E1121 Type 2 diabetes mellitus with diabetic nephropathy: Secondary | ICD-10-CM | POA: Diagnosis not present

## 2020-08-20 DIAGNOSIS — I10 Essential (primary) hypertension: Secondary | ICD-10-CM | POA: Diagnosis not present

## 2020-08-23 ENCOUNTER — Other Ambulatory Visit: Payer: Self-pay | Admitting: Internal Medicine

## 2020-08-23 ENCOUNTER — Telehealth: Payer: Self-pay

## 2020-08-23 DIAGNOSIS — N183 Chronic kidney disease, stage 3 unspecified: Secondary | ICD-10-CM

## 2020-08-23 DIAGNOSIS — E1121 Type 2 diabetes mellitus with diabetic nephropathy: Secondary | ICD-10-CM

## 2020-08-23 DIAGNOSIS — I5022 Chronic systolic (congestive) heart failure: Secondary | ICD-10-CM

## 2020-08-23 NOTE — Patient Instructions (Addendum)
Dear James Braun,  It was a pleasure meeting you during our initial appointment on August 20, 2020. Below is a summary of the goals we discussed and components of chronic care management. Please contact me anytime with questions or concerns.   Visit Information  Goals Addressed            This Visit's Progress   . Pharmacy Care Plan       CARE PLAN ENTRY (see longitudinal plan of care for additional care plan information)  Current Barriers:  . Chronic Disease Management support, education, and care coordination needs related to Hypertension and Diabetes   Hypertension BP Readings from Last 3 Encounters:  06/18/20 118/68  06/17/20 135/61  06/11/20 (!) 146/95   . Pharmacist Clinical Goal(s): o Over the next 6 months, patient will work with PharmD and providers to maintain BP goal <130/80 . Current regimen:   Carvedilol 3.125 mg - 1 tablet twice daily with meals  Spironolactone 25 mg - 1 tablet daily  . Interventions: o Reviewed home blood pressure log - usually less than 120/80. Denies any low blood pressure < 90/60. o Reviewed adherence - carvedilol past due for refill but patient has plenty on hand and confirms taking twice daily  . Patient self care activities - Over the next 6 months, patient will: o Check BP once monthly, document, and provide at future appointments  Diabetes Lab Results  Component Value Date/Time   HGBA1C 6.6 (H) 04/23/2020 09:04 AM   HGBA1C 7.2 (H) 11/27/2019 06:47 PM   . Pharmacist Clinical Goal(s): o Over the next 6 months, patient will work with PharmD and providers to maintain A1c goal <7% . Current regimen:  o Metformin ER 500 mg - 1 tablet twice daily . Interventions: o Reviewed medication adherence and tolerance o Discussed vitamin B12 administration - recommend reducing to once daily due to last level > 1500 10/2019.  Marland Kitchen Patient self care activities - Over the next 6 months, patient will: o Check blood sugar with symptoms of  hypoglycemia, document, and provide at future appointments o Contact provider with any episodes of hypoglycemia  Initial goal documentation       James Braun was given information about Chronic Care Management services today including:  1. CCM service includes personalized support from designated clinical staff supervised by his physician, including individualized plan of care and coordination with other care providers 2. 24/7 contact phone numbers for assistance for urgent and routine care needs. 3. Standard insurance, coinsurance, copays and deductibles apply for chronic care management only during months in which we provide at least 20 minutes of these services. Most insurances cover these services at 100%, however patients may be responsible for any copay, coinsurance and/or deductible if applicable. This service may help you avoid the need for more expensive face-to-face services. 4. Only one practitioner may furnish and bill the service in a calendar month. 5. The patient may stop CCM services at any time (effective at the end of the month) by phone call to the office staff.  Patient agreed to services and verbal consent obtained.   The patient verbalized understanding of instructions, educational materials, and care plan provided today and agreed to receive a mailed copy of patient instructions, educational materials, and care plan.  Telephone follow up appointment with pharmacy team member scheduled for: February 21, 2021, 8:30 AM PHONE VISIT  James Braun, PharmD Clinical Pharmacist [facility] at [facility] [phone]   Basics of Medicine Management Taking your  medicines correctly is an important part of managing or preventing medical problems. Make sure you know what disease or condition your medicine is treating, and how and when to take it. If you do not take your medicine correctly, it may not work well and may cause unpleasant side effects, including serious health  problems. What should I do when I am taking medicines?  Read all the labels and inserts that come with your medicines. Review the information often.  Talk with your pharmacist if you get a refill and notice a change in the size, color, or shape of your medicines.  Know the potential side effects for each medicine that you take.  Try to get all your medicines from the same pharmacy. The pharmacist will have all your information and will understand how your medicines will affect each other (interact).  Tell your health care provider about all your medicines, including over-the-counter medicines, vitamins, and herbal or dietary supplements. He or she will make sure that nothing will interact with any of your prescribed medicines.   How can I take my medicines safely?  Take medicines only as told by your health care provider. ? Do not take more of your medicine than instructed. ? Do not take anyone else's medicines. ? Do not share your medicines with others. ? Do not stop taking your medicines unless your health care provider tells you to do so. ? You may need to avoid alcohol or certain foods or liquids when taking certain medicines. Follow your health care provider's instructions.  Do not split, mash, or chew your medicines unless your health care provider tells you to do so. Tell your health care provider if you have trouble swallowing your medicines.  For liquid medicine, use the dosing container that was provided. How should I organize my medicines? Know your medicines  Know what each of your medicines looks like. This includes size, color, and shape. Tell your health care provider if you are having trouble recognizing all the medicines that you are taking.  If you cannot tell your medicines apart because they look similar, keep them in original bottles.  If you cannot read the labels on the bottles, tell your pharmacist to put your medicines in containers with large print.  Review  your medicines and your schedule with family members, a friend, or a caregiver. Use a pill organizer  Use a tool to organize your medicine schedule. Tools include a weekly pillbox, a written chart, a notebook, or a calendar.  Your tool should help you remember the following things about each medicine: ? The name of the medicine. ? The amount (dose) to take. ? The schedule. This is the day and time the medicine should be taken. ? The appearance. This includes color, shape, size, and stamp. ? How to take your medicines. This includes instructions to take them with food, without food, with fluids, or with other medicines.  Create reminders for taking your medicines. Use sticky notes, or alarms on your watch, mobile device, or phone calendar.  You may choose to use a more advanced management system. These systems have storage, alarms, and visual and audio prompts.  Some medicines can be taken on an "as-needed" basis. These include medicines for nausea or pain. If you take an as-needed medicine, write down the name and dose, as well as the date and time that you took it.   How should I plan for travel?  Take your pillbox, medicines, and organization system with you when traveling.  Have your medicines refilled before you travel. This will ensure that you do not run out of your medicines while you are away from home.  Always carry an updated list of your medicines with you. If there is an emergency, a first responder can quickly see what medicines you are taking.  Do not pack your medicines in checked luggage in case your luggage is lost or delayed.  If any of your medicines is considered a controlled substance, make sure you bring a letter from your health care provider with you. How should I store and discard my medicines? For safe storage:  Store medicines in a cool, dry area away from light, or as directed by your health care provider. Do not store medicines in the bathroom. Heat and  humidity will affect them.  Do not store your medicines with other chemicals, or with medicines for pets or other household members.  Keep medicines away from children and pets. Do not leave them on counters or bedside tables. Store them in high cabinets or on high shelves. For safe disposal:  Check expiration dates regularly. Do not take expired medicines. Discard medicines that are older than the expiration date.  Learn a safe way to dispose of your medicines. You may: ? Use a local government, hospital, or pharmacy medicine-take-back program. ? Mix the medicines with inedible substances, put them in a sealed bag or empty container, and throw them in the trash. What should I remember?  Tell your health care provider if you: ? Experience side effects. ? Have new symptoms. ? Have other concerns about taking your medicines.  Review your medicines regularly with your health care provider. Other medicines, diet, medical conditions, weight changes, and daily habits can all affect how medicines work. Ask if you need to continue taking each medicine, and discuss how well each one is working.  Refill your medicines early to avoid running out of them.  In case of an accidental overdose, call your local [place] at [phone] or visit your local emergency department immediately. This is important. Summary  Taking your medicines correctly is an important part of managing or preventing medical problems.  You need to make sure that you understand what you are taking a medicine for, as well as how and when you need to take it.  Know your medicines and use a pill organizer to help you take your medicines correctly.  In case of an accidental overdose, call your local [place] at [phone] or visit your local emergency department immediately. This is important. This information is not intended to replace advice given to you by your health care provider. Make sure  you discuss any questions you have with your health care provider. Document Revised: 06/28/2017 Document Reviewed: 06/28/2017 Elsevier Patient Education  2021 [employer].

## 2020-08-23 NOTE — Telephone Encounter (Signed)
Thanks! I agree with the reduction in B12 dose

## 2020-08-23 NOTE — Telephone Encounter (Signed)
PCP consult regarding CCM visit 08/20/20:  Discussed with patient current B12 dose. He is taking 2000 mcg daily. His last B12 > 1500 10/2019 and does not recall changing his supplement. I recommended cutting back to 1000 mcg daily. Would like to update his B12 level at lab appointment 2/22. He also started an iron supplement this year so I added this to his med list.  [name], PharmD Clinical Pharmacist [facility] at [facility] [phone]

## 2020-09-02 ENCOUNTER — Other Ambulatory Visit: Payer: Self-pay

## 2020-09-02 MED ORDER — METFORMIN HCL ER 500 MG PO TB24
500.0000 mg | ORAL_TABLET | Freq: Two times a day (BID) | ORAL | 3 refills | Status: DC
Start: 2020-09-02 — End: 2021-08-01

## 2020-09-02 MED ORDER — FINASTERIDE 5 MG PO TABS
5.0000 mg | ORAL_TABLET | Freq: Every day | ORAL | 3 refills | Status: DC
Start: 2020-09-02 — End: 2021-08-01

## 2020-09-02 NOTE — Telephone Encounter (Signed)
Rx sent electronically.  

## 2020-09-07 ENCOUNTER — Other Ambulatory Visit: Payer: Self-pay

## 2020-09-07 ENCOUNTER — Other Ambulatory Visit (INDEPENDENT_AMBULATORY_CARE_PROVIDER_SITE_OTHER): Payer: PPO

## 2020-09-07 DIAGNOSIS — I5022 Chronic systolic (congestive) heart failure: Secondary | ICD-10-CM | POA: Diagnosis not present

## 2020-09-07 DIAGNOSIS — N183 Chronic kidney disease, stage 3 unspecified: Secondary | ICD-10-CM | POA: Diagnosis not present

## 2020-09-07 DIAGNOSIS — E1121 Type 2 diabetes mellitus with diabetic nephropathy: Secondary | ICD-10-CM

## 2020-09-07 LAB — CBC
HCT: 30.9 % — ABNORMAL LOW (ref 39.0–52.0)
Hemoglobin: 9.8 g/dL — ABNORMAL LOW (ref 13.0–17.0)
MCHC: 31.8 g/dL (ref 30.0–36.0)
MCV: 90.2 fl (ref 78.0–100.0)
Platelets: 381 10*3/uL (ref 150.0–400.0)
RBC: 3.42 Mil/uL — ABNORMAL LOW (ref 4.22–5.81)
RDW: 17.9 % — ABNORMAL HIGH (ref 11.5–15.5)
WBC: 5.4 10*3/uL (ref 4.0–10.5)

## 2020-09-07 LAB — RENAL FUNCTION PANEL
Albumin: 4.3 g/dL (ref 3.5–5.2)
BUN: 29 mg/dL — ABNORMAL HIGH (ref 6–23)
CO2: 26 mEq/L (ref 19–32)
Calcium: 9.2 mg/dL (ref 8.4–10.5)
Chloride: 106 mEq/L (ref 96–112)
Creatinine, Ser: 1.24 mg/dL (ref 0.40–1.50)
GFR: 54.91 mL/min — ABNORMAL LOW (ref >60.00)
Glucose, Bld: 190 mg/dL — ABNORMAL HIGH (ref 70–99)
Phosphorus: 3.1 mg/dL (ref 2.3–4.6)
Potassium: 3.9 mEq/L (ref 3.5–5.1)
Sodium: 140 mEq/L (ref 135–145)

## 2020-09-07 LAB — HEMOGLOBIN A1C: Hgb A1c MFr Bld: 7 % — ABNORMAL HIGH (ref 4.6–6.5)

## 2020-09-12 NOTE — Progress Notes (Signed)
Advanced Heart Failure Clinic Note   Referring Physician: PCP: [name], MD PCP-Cardiologist: Quay Burow, MD  [facility]: Dr. Haroldine Laws   Reason for Visit: [facility] F/u, Chronic Systolic Heart Failure/ Ischemic Cardiomyopathy    HPI: [name] is a 80 y/o male w/ HTN, HLD, T2DM, CAD s/p CABG 6/83 and systolic HF due to iCM  Underwent CABG x 3 (LIMA-LAD, SVG-diag, SVG-PLB) on 11/28/19 by Dr. [name]. Post operative course c/b atrial fibrillation + post cardiotomy shock requiring amiodarone and milrinone. Post op echo showed EF ~35% and mildly reduced RV systolic function.   I last saw him in 7/21.  He had been feeling great until he had mechanical fall with head trauma. We sent him for CT which was ok.  Echo 11/21 EF 45-50%. RV normal.   Here for f/u. Says he is feeling pretty good. Exercising 2x/day with 15 min walks. No CP or SOB. Mild chest soreness.   Review of Systems: Pertinent +/- outline in HPI    Past Medical History:  Diagnosis Date  . BPH (benign prostatic hypertrophy)   . Cancer ([place])    melanoma  . Coronary artery disease   . History of kidney stones   . History of melanoma excision OCT 2010  . Hyperlipidemia   . Hypertension   . Left ureteral calculus   . Nephrolithiasis    bilateral  . Type 2 diabetes mellitus ([place])     Current Outpatient Medications  Medication Sig Dispense Refill  . alum hydroxide-mag trisilicate (GAVISCON) 41-96 MG CHEW chewable tablet Chew 2 tablets by mouth 3 (three) times daily as needed for indigestion or heartburn.    Marland Kitchen aspirin EC 81 MG EC tablet Take 1 tablet (81 mg total) by mouth daily.    . carvedilol (COREG) 3.125 MG tablet Take 1 tablet (3.125 mg total) by mouth 2 (two) times daily. 60 tablet 6  . Cyanocobalamin (VITAMIN B-12) 1000 MCG SUBL Place 2,000 mcg under the tongue. 1 BID    . ferrous sulfate 325 (65 FE) MG tablet Take 325 mg by mouth daily with breakfast.    . finasteride (PROSCAR) 5 MG tablet  Take 1 tablet (5 mg total) by mouth daily. 90 tablet 3  . furosemide (LASIX) 20 MG tablet TAKE 1 TABLET (20 MG TOTAL) BY MOUTH DAILY. DAILY, AS NEEDED FOR LOWER LEG SWELLING. 30 tablet 0  . metFORMIN (GLUCOPHAGE-XR) 500 MG 24 hr tablet Take 1 tablet (500 mg total) by mouth 2 (two) times daily. 180 tablet 3  . Multiple Vitamin (MULTIVITAMIN) tablet Take 1 tablet by mouth daily.    Marland Kitchen omeprazole (PRILOSEC) 20 MG capsule Take 20 mg by mouth as needed.    . Potassium Citrate 15 MEQ (1620 MG) TBCR TAKE 1 TABLET BY MOUTH 3 TIMES A DAY (Patient taking differently: Taking BID as of 08/20/20. Reports instructed to decrease from TID to BID.) 270 tablet 3  . rosuvastatin (CRESTOR) 10 MG tablet Take 10 mg by mouth daily.    . sacubitril-valsartan (ENTRESTO) 24-26 MG Take 1 tablet by mouth 2 (two) times daily. 180 tablet 3  . spironolactone (ALDACTONE) 25 MG tablet TAKE 1 TABLET BY MOUTH EVERY DAY 30 tablet 3  . timolol (TIMOPTIC) 0.5 % ophthalmic solution Place 1 drop into both eyes 2 (two) times daily.     . Vitamin D3 (VITAMIN D) 25 MCG tablet Take 1 tablet by mouth 2 (two) times daily.     No current facility-administered medications for  this encounter.    Allergies  Allergen Reactions  . Atorvastatin Other (See Comments)    myalgias  . Morphine And Related Other (See Comments)     hypotension  . Levofloxacin Rash  . Septra [Sulfamethoxazole-Trimethoprim] Rash  . Sulfamethoxazole-Trimethoprim Rash      Social History   Socioeconomic History  . Marital status: Married    Spouse name: Not on file  . Number of children: 2  . Years of education: Not on file  . Highest education level: Not on file  Occupational History  . 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 Occupation: Buys very distressed houses and rehabilitates  . Occupation: Does mediation  Tobacco Use  . Smoking status: Never Smoker  . Smokeless tobacco: Never Used  Substance and Sexual Activity  . Alcohol use: No  . Drug  use: No  . Sexual activity: Not on file  Other Topics Concern  . Not on file  Social History Narrative   Has living will   Wife is health care POA---alternate is committee of physicians   Would accept resuscitation---wouldn't accept prolonged ventilation   No tube feeds if cognitively unaware   Right handed   Social Determinants of Health   Financial Resource Strain: Low Risk   . Difficulty of Paying Living Expenses: Not hard at all  Food Insecurity: Not on file  Transportation Needs: Not on file  Physical Activity: Not on file  Stress: Not on file  Social Connections: Not on file  Intimate Partner Violence: Not on file      Family History  Problem Relation Age of Onset  . Hypertension Mother   . Diabetes Father   . Hypertension Father   . Colon polyps Brother   . Coronary artery disease Neg Hx   . Cancer Neg Hx   . Esophageal cancer Neg Hx   . Stomach cancer Neg Hx   . Rectal cancer Neg Hx     Vitals:   09/13/20 1359  BP: 110/70  Pulse: 95  SpO2: 97%  Weight: 76.3 kg (168 lb 3.2 oz)     PHYSICAL EXAM: General:  Well appearing. No resp difficulty HEENT: normal Neck: supple. no JVD. Carotids 2+ bilat; no bruits. No lymphadenopathy or thryomegaly appreciated. Cor: PMI nondisplaced. Regular rate & rhythm. No rubs, gallops or murmurs. Lungs: clear Abdomen: soft, nontender, nondistended. No hepatosplenomegaly. No bruits or masses. Good bowel sounds. Extremities: no cyanosis, clubbing, rash, edema Neuro: alert & orientedx3, cranial nerves grossly intact. moves all 4 extremities w/o difficulty. Affect pleasant   ASSESSMENT & PLAN:  1. CAD - s/p CABG x 3 5/21 (LIMA-LAD, SVG-dig, SVG- PLB) - no s/s angina - continue ASA + statin  - Continue carvedilol 3.125 bid - with DM2 should consider Jardiance    2.Chronic Systolic HF/ Ischemic Cardiomyopathy  - Echo 5/21 LVEF 16%-10%, RV systolic function mildly reduced, in the setting of multivessel CAD s/p CABG -  Developed Post Cardiotomy Cardiogenic Shock following CABG and required short course of milrinone.   - Echo 11/21 EF 45-50%. RV normal.  - Volume status stable. Euvolemic on exam - NYHA II - Continue Entresto 24-26 bid. - Continue Spironolactone 25 mg daily  - Continue carvedilol 3.125 bid  3. DM2 - HgBa1c 7.0% - With CAD would continue Jardaince   4. Post-operative Afib - maintaining NSR. Now off amio  - no longer on Eliquis (d/c by PCP due to melena, now resolved). Colonoscopy 10/21 ok,    5. PVCs - noted to  have frequent PVCs pos operatively  - suppressed w/ amiodarone. Now off amio  - continue b-blocker  6. Dizziness - has been seen by Neurology. Seen by Dr. Tomi Likens - brain MRI 11/21. No acute intracranial process. Chronic bilateral basal ganglia lacunar insults. -> recommended going to gym to improve balance/strength - carotid u/s 12/21 1-39% bilaterally - improving steadily.   He is doing well. EF nearly normalized. Can graduate HF Clinic. F/u with Dr. [name]. Can return to [place] as needed.Glori Bickers, MD 09/12/20

## 2020-09-13 ENCOUNTER — Other Ambulatory Visit: Payer: PPO

## 2020-09-13 ENCOUNTER — Encounter (HOSPITAL_COMMUNITY): Payer: Self-pay | Admitting: Internal Medicine

## 2020-09-13 ENCOUNTER — Ambulatory Visit (HOSPITAL_COMMUNITY)
Admission: RE | Admit: 2020-09-13 | Discharge: 2020-09-13 | Disposition: A | Discharge status: Home / Self Care | Payer: PPO | Source: Ambulatory Visit | Attending: Internal Medicine | Admitting: Internal Medicine

## 2020-09-13 ENCOUNTER — Other Ambulatory Visit: Payer: Self-pay

## 2020-09-13 ENCOUNTER — Other Ambulatory Visit: Payer: Self-pay | Admitting: Internal Medicine

## 2020-09-13 VITALS — BP 110/70 | HR 95 | Wt 168.2 lb

## 2020-09-13 DIAGNOSIS — R42 Dizziness and giddiness: Secondary | ICD-10-CM | POA: Insufficient documentation

## 2020-09-13 DIAGNOSIS — E785 Hyperlipidemia, unspecified: Secondary | ICD-10-CM | POA: Diagnosis not present

## 2020-09-13 DIAGNOSIS — Z7984 Long term (current) use of oral hypoglycemic drugs: Secondary | ICD-10-CM | POA: Insufficient documentation

## 2020-09-13 DIAGNOSIS — Z882 Allergy status to sulfonamides status: Secondary | ICD-10-CM | POA: Diagnosis not present

## 2020-09-13 DIAGNOSIS — Z951 Presence of aortocoronary bypass graft: Secondary | ICD-10-CM | POA: Diagnosis not present

## 2020-09-13 DIAGNOSIS — Z8249 Family history of ischemic heart disease and other diseases of the circulatory system: Secondary | ICD-10-CM | POA: Diagnosis not present

## 2020-09-13 DIAGNOSIS — Z79899 Other long term (current) drug therapy: Secondary | ICD-10-CM | POA: Diagnosis not present

## 2020-09-13 DIAGNOSIS — Z7982 Long term (current) use of aspirin: Secondary | ICD-10-CM | POA: Insufficient documentation

## 2020-09-13 DIAGNOSIS — Z881 Allergy status to other antibiotic agents status: Secondary | ICD-10-CM | POA: Diagnosis not present

## 2020-09-13 DIAGNOSIS — E119 Type 2 diabetes mellitus without complications: Secondary | ICD-10-CM | POA: Insufficient documentation

## 2020-09-13 DIAGNOSIS — Z885 Allergy status to narcotic agent status: Secondary | ICD-10-CM | POA: Insufficient documentation

## 2020-09-13 DIAGNOSIS — I110 Hypertensive heart disease with heart failure: Secondary | ICD-10-CM | POA: Insufficient documentation

## 2020-09-13 DIAGNOSIS — I2510 Atherosclerotic heart disease of native coronary artery without angina pectoris: Secondary | ICD-10-CM

## 2020-09-13 DIAGNOSIS — I9789 Other postprocedural complications and disorders of the circulatory system, not elsewhere classified: Secondary | ICD-10-CM | POA: Diagnosis not present

## 2020-09-13 DIAGNOSIS — I5022 Chronic systolic (congestive) heart failure: Secondary | ICD-10-CM

## 2020-09-13 NOTE — Addendum Note (Signed)
Encounter addended by: Valeda Malm, RN on: 09/13/2020 2:23 PM  Actions taken: Order list changed, Diagnosis association updated, Clinical Note Signed

## 2020-09-13 NOTE — Patient Instructions (Signed)
You have been referred to Dr Etter Sjogren in the [facility] cardiology department. You will get a call to schedule this appointment.  Your physician recommends that you schedule a follow-up appointment in: 6 months with Dr Etter Sjogren   At the [facility], you and your health needs are our priority. As part of our continuing mission to provide you with exceptional heart care, we have created designated Provider Care Teams. These Care Teams include your primary Cardiologist (physician) and Advanced Practice Providers (APPs- Physician Assistants and Nurse Practitioners) who all work together to provide you with the care you need, when you need it.   You may see any of the following providers on your designated Care Team at your next follow up: Marland Kitchen Dr Glori Bickers . Dr Loralie Champagne . Dr Vickki Muff . Darrick Grinder, NP . Lyda Jester, [place] . Audry Riles, PharmD   Please be sure to bring in all your medications bottles to every appointment.

## 2020-09-14 ENCOUNTER — Telehealth: Payer: Self-pay | Admitting: Cardiovascular Disease

## 2020-09-14 NOTE — Telephone Encounter (Signed)
OK with me James Furbish, MD

## 2020-09-14 NOTE — Telephone Encounter (Signed)
New Message   Pt would like to switch provider from Dr. [name] to Dr. Marlou Porch. Referral also sent by Dr. Haroldine Laws  Please advise

## 2020-09-16 NOTE — Telephone Encounter (Signed)
   Needs both providers approval, please advise

## 2020-09-17 ENCOUNTER — Telehealth: Payer: Self-pay | Admitting: Cardiology

## 2020-09-17 ENCOUNTER — Encounter: Payer: Self-pay | Admitting: Internal Medicine

## 2020-09-17 ENCOUNTER — Ambulatory Visit (INDEPENDENT_AMBULATORY_CARE_PROVIDER_SITE_OTHER): Payer: PPO | Admitting: Internal Medicine

## 2020-09-17 ENCOUNTER — Other Ambulatory Visit: Payer: Self-pay

## 2020-09-17 VITALS — BP 102/60 | HR 97 | Temp 97.3°F | Ht 67.0 in | Wt 165.0 lb

## 2020-09-17 DIAGNOSIS — I2510 Atherosclerotic heart disease of native coronary artery without angina pectoris: Secondary | ICD-10-CM

## 2020-09-17 DIAGNOSIS — I5022 Chronic systolic (congestive) heart failure: Secondary | ICD-10-CM | POA: Diagnosis not present

## 2020-09-17 DIAGNOSIS — D649 Anemia, unspecified: Secondary | ICD-10-CM | POA: Diagnosis not present

## 2020-09-17 LAB — IBC + FERRITIN
Ferritin: 6.6 ng/mL — ABNORMAL LOW (ref 22.0–322.0)
Iron: 26 ug/dL — ABNORMAL LOW (ref 42–165)
Saturation Ratios: 5.2 % — ABNORMAL LOW (ref 20.0–50.0)
Transferrin: 356 mg/dL (ref 212.0–360.0)

## 2020-09-17 LAB — CBC
HCT: 27.1 % — ABNORMAL LOW (ref 39.0–52.0)
Hemoglobin: 8.7 g/dL — ABNORMAL LOW (ref 13.0–17.0)
MCHC: 32.2 g/dL (ref 30.0–36.0)
MCV: 90.8 fl (ref 78.0–100.0)
Platelets: 385 10*3/uL (ref 150.0–400.0)
RBC: 2.99 Mil/uL — ABNORMAL LOW (ref 4.22–5.81)
RDW: 18 % — ABNORMAL HIGH (ref 11.5–15.5)
WBC: 5.1 10*3/uL (ref 4.0–10.5)

## 2020-09-17 LAB — FOLATE: Folate: >23.6 ng/mL (ref >5.9)

## 2020-09-17 LAB — VITAMIN B12: Vitamin B-12: >1506 pg/mL — ABNORMAL HIGH (ref 211–911)

## 2020-09-17 NOTE — Assessment & Plan Note (Signed)
Doing well since the CABG

## 2020-09-17 NOTE — Assessment & Plan Note (Signed)
Compensated on entresto, carvedilol, ASA, spironolactone

## 2020-09-17 NOTE — Telephone Encounter (Signed)
    Pt made an appt with Dr. Marlou Porch in July, he said he will have a bunch of tests with his PCP around June and if there's anything else that Dr. Marlou Porch wants him to get just let him know.

## 2020-09-17 NOTE — Telephone Encounter (Signed)
Fine with me

## 2020-09-17 NOTE — Telephone Encounter (Signed)
Per Dr Marlou Porch - no new orders at this time.  He will see pt as  Scheduled.

## 2020-09-17 NOTE — Assessment & Plan Note (Signed)
Had clear GI bleed after CABG while on eliquis Hemoglobin recovered--now back down again Will recheck labs---if iron deficient---will send back to Dr [name] Asked him to restart omeprazole at bedtime

## 2020-09-17 NOTE — Progress Notes (Signed)
Subjective:    Patient ID: James Braun, male    DOB: June 09, 1941, 80 y.o.   MRN: [id number]  HPI Here for follow up of diabetes and other chronic health conditions Biggest issue is anemia also This visit occurred during the SARS-CoV-2 public health emergency.  Safety protocols were in place, including screening questions prior to the visit, additional usage of staff PPE, and extensive cleaning of exam room while observing appropriate contact time as indicated for disinfecting solutions.   Sugars have been okay He checks twice a month--usually 150 or below  Reviewed hemoglobin---had recovered to 12 but now back down 9.8 Did have the black stools while on eliquis but that was many months ago No black or discolored stools now He is using an OTC iron--since the black stools Did have negative EGD in April---before CABG Stomach is "unsettled" at night at times---uses gaviscon Stopped the omeprazole about a month ago  Does have some sternal chest pain Trying to do 15 minutes walking twice a day Breathing is okay---still with trouble with energy No dizziness or syncope  Current Outpatient Medications on File Prior to Visit  Medication Sig Dispense Refill  . alum hydroxide-mag trisilicate (GAVISCON) 45-80 MG CHEW chewable tablet Chew 2 tablets by mouth 3 (three) times daily as needed for indigestion or heartburn.    Marland Kitchen aspirin EC 81 MG EC tablet Take 1 tablet (81 mg total) by mouth daily.    . carvedilol (COREG) 3.125 MG tablet Take 1 tablet (3.125 mg total) by mouth 2 (two) times daily. 60 tablet 6  . Cyanocobalamin (VITAMIN B-12) 1000 MCG SUBL Place 2,000 mcg under the tongue. 1 BID    . ferrous sulfate 325 (65 FE) MG tablet Take 325 mg by mouth daily with breakfast.    . finasteride (PROSCAR) 5 MG tablet Take 1 tablet (5 mg total) by mouth daily. 90 tablet 3  . metFORMIN (GLUCOPHAGE-XR) 500 MG 24 hr tablet Take 1 tablet (500 mg total) by mouth 2 (two) times daily. 180 tablet 3  .  Multiple Vitamin (MULTIVITAMIN) tablet Take 1 tablet by mouth daily.    Marland Kitchen omeprazole (PRILOSEC) 20 MG capsule Take 20 mg by mouth as needed.    . Potassium Citrate 15 MEQ (1620 MG) TBCR TAKE 1 TABLET BY MOUTH 3 TIMES A DAY 270 tablet 3  . rosuvastatin (CRESTOR) 10 MG tablet Take 10 mg by mouth daily.    . sacubitril-valsartan (ENTRESTO) 24-26 MG Take 1 tablet by mouth 2 (two) times daily. 180 tablet 3  . spironolactone (ALDACTONE) 25 MG tablet TAKE 1 TABLET BY MOUTH EVERY DAY 30 tablet 3  . timolol (TIMOPTIC) 0.5 % ophthalmic solution Place 1 drop into both eyes 2 (two) times daily.     . Vitamin D3 (VITAMIN D) 25 MCG tablet Take 1 tablet by mouth 2 (two) times daily.    . furosemide (LASIX) 20 MG tablet TAKE 1 TABLET (20 MG TOTAL) BY MOUTH DAILY. DAILY, AS NEEDED FOR LOWER LEG SWELLING. 30 tablet 0   No current facility-administered medications on file prior to visit.    Allergies  Allergen Reactions  . Atorvastatin Other (See Comments)    myalgias  . Morphine And Related Other (See Comments)     hypotension  . Levofloxacin Rash  . Septra [Sulfamethoxazole-Trimethoprim] Rash  . Sulfamethoxazole-Trimethoprim Rash    Past Medical History:  Diagnosis Date  . BPH (benign prostatic hypertrophy)   . Cancer ([place])    melanoma  . Coronary artery  disease   . History of kidney stones   . History of melanoma excision OCT 2010  . Hyperlipidemia   . Hypertension   . Left ureteral calculus   . Nephrolithiasis    bilateral  . Type 2 diabetes mellitus ([place])     Past Surgical History:  Procedure Laterality Date  . CARDIAC CATHETERIZATION    . CHOLECYSTECTOMY  july 2000  . CORONARY ARTERY BYPASS GRAFT N/A 11/28/2019   Procedure: CORONARY ARTERY BYPASS GRAFTING (CABG) x3, using left internal mammary artery and Saphenous vein harvested endoscopically;  Surgeon: [name], MD;  Location: [place];  Service: Open Heart Surgery;  Laterality: N/A;  . CYSTOSCOPY WITH STENT PLACEMENT Left  11/14/2012   Procedure: CYSTOSCOPY WITH STENT PLACEMENT;  Surgeon: [name], MD;  Location: [facility];  Service: Urology;  Laterality: Left;  . CYSTOSCOPY/RETROGRADE/URETEROSCOPY/STONE EXTRACTION WITH BASKET Left 11/14/2012   Procedure: CYSTOSCOPY/RETROGRADE/URETEROSCOPY/STONE EXTRACTION WITH BASKET;  Surgeon: [name], MD;  Location: [facility];  Service: Urology;  Laterality: Left;  . EXTRACORPOREAL SHOCK WAVE LITHOTRIPSY  JULY 2000   X2  . HOLMIUM LASER APPLICATION Left 10/15/9377   Procedure: HOLMIUM LASER APPLICATION;  Surgeon: [name], MD;  Location: [facility];  Service: Urology;  Laterality: Left;  . LEFT HEART CATH AND CORONARY ANGIOGRAPHY N/A 11/27/2019   Procedure: LEFT HEART CATH AND CORONARY ANGIOGRAPHY;  Surgeon: [name], MD;  Location: [facility];  Service: Cardiovascular;  Laterality: N/A;  . LEFT URETEROSCOPIC STONE EXTRACTION  02-22-2007  . NEPHROLITHOTOMY Right 09-28-2010   percutaneous  . ORIF CLAVICLE FRACTURE  2006  . PROSTATE BIOPSY  1998   neg  . SHOULDER ARTHROSCOPY  1/15   Dr [name]  . TEE WITHOUT CARDIOVERSION N/A 11/28/2019   Procedure: TRANSESOPHAGEAL ECHOCARDIOGRAM (TEE);  Surgeon: [name], Collier Salina, MD;  Location: [place];  Service: Open Heart Surgery;  Laterality: N/A;  . TONSILLECTOMY      Family History  Problem Relation Age of Onset  . Hypertension Mother   . Diabetes Father   . Hypertension Father   . Colon polyps Brother   . Coronary artery disease Neg Hx   . Cancer Neg Hx   . Esophageal cancer Neg Hx   . Stomach cancer Neg Hx   . Rectal cancer Neg Hx     Social History   Socioeconomic History  . Marital status: Married    Spouse name: Not on file  . Number of children: 2  . Years of education: Not on file  . Highest education level: Not on file  Occupational History  . 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 Occupation: Buys very distressed  houses and rehabilitates  . Occupation: Does mediation  Tobacco Use  . Smoking status: Never Smoker  . Smokeless tobacco: Never Used  Substance and Sexual Activity  . Alcohol use: No  . Drug use: No  . Sexual activity: Not on file  Other Topics Concern  . Not on file  Social History Narrative   Has living will   Wife is health care POA---alternate is committee of physicians   Would accept resuscitation---wouldn't accept prolonged ventilation   No tube feeds if cognitively unaware   Right handed   Social Determinants of Health   Financial Resource Strain: Low Risk   . Difficulty of Paying Living Expenses: Not hard at all  Food Insecurity: Not on file  Transportation Needs: Not on file  Physical Activity: Not on file  Stress: Not on file  Social Connections: Not on file  Intimate Partner Violence: Not on file   Review of Systems Appetite is good Weight is stable now Due for corneal transplant on right    Objective:   Physical Exam Constitutional:      Appearance: Normal appearance.  Cardiovascular:     Rate and Rhythm: Normal rate and regular rhythm.     Heart sounds: No murmur heard. No gallop.   Pulmonary:     Effort: Pulmonary effort is normal.     Breath sounds: Normal breath sounds. No wheezing or rales.  Musculoskeletal:     Cervical back: Neck supple.     Right lower leg: No edema.     Left lower leg: No edema.  Lymphadenopathy:     Cervical: No cervical adenopathy.  Neurological:     Mental Status: He is alert.            Assessment & Plan:

## 2020-09-21 ENCOUNTER — Telehealth: Payer: Self-pay | Admitting: Gastroenterology

## 2020-09-21 ENCOUNTER — Other Ambulatory Visit: Payer: Self-pay

## 2020-09-21 ENCOUNTER — Other Ambulatory Visit: Payer: Self-pay | Admitting: Internal Medicine

## 2020-09-21 DIAGNOSIS — D509 Iron deficiency anemia, unspecified: Secondary | ICD-10-CM

## 2020-09-21 NOTE — Telephone Encounter (Signed)
Inbound call from patient wanting to schedule an appointment with Dr. [name] for a referral we received for anemia.  Informed patient next availability is not until May but can be seen sooner with an APP.  Patient prefers to see Dr. [name].  Please advise.

## 2020-09-21 NOTE — Telephone Encounter (Signed)
Spoke with patient, he states that he has been referred back to [place] for IDA. Patient states that he has worsening anemia, he reports feeling fatigued and weak, denies any BRB or bleeding symptoms. He reports that he has been taking oral iron daily for about 1 month, he states that he does have dark stools but not black, he states that it is related to the iron. Patient would only like to see Dr. [name], patient has been scheduled for the next available appointment which is Monday, 11/08/20 at 10 AM. Patient does not want to wait that long for further recommendations and is requesting that Dr. [name] contact him via phone or My Chart.

## 2020-09-21 NOTE — Telephone Encounter (Signed)
I have reviewed the recent lab studies.  He has had EGD and colonoscopy within the last year for iron deficiency anemia.  The upper endoscopy discovered a large hiatal hernia.  No erosions were seen within that herniated stomach, but as noted in my EGD report, that often occurs intermittently and is a common cause for iron deficiency anemia from chronic microscopic blood loss.  I referred him to general surgery about the hiatal hernia, but I think he elected not to have that done as far as I know.  For most patients in this scenario, adequate oral iron supplementation will be enough to replenish their iron stores and improve or perhaps normalize their hemoglobin.  However, oral iron is clearly not doing that sufficiently for James Braun.  So he either does not absorb oral iron well from the intestine, has an additional source of occult GI blood loss somewhere in the small bowel beyond the reach of the endoscopic procedures he has thus far had, or both. I had considered a small bowel video capsule study, but I am concerned that it would be retained in the herniated stomach and then not be able to evaluate the small bowel.  I also considered a small bowel source of blood loss to be less likely than gastric erosions from the hiatal hernia.  My advice at this point is:  He needs IV iron.  Please make arrangements for Feraheme 510 mg IV and a repeat infusion of the same dose approximately a week later After that, he can stop the iron tablets.  A referral to hematology for ongoing monitoring and management of his anemia.  I will also see him in clinic as scheduled.  - HD

## 2020-09-21 NOTE — Telephone Encounter (Signed)
Spoke with patient in regards to recommendations per Dr. [name] as outlined below. Patient would like to proceed with IV iron, patient is aware that a referral to Hematology has been placed as well and they will contact him to set up an appointment for monitoring and management of his anemia, advised patient to give them a week to give him a call. Advised patient to keep follow up with Dr. [name] as scheduled. Patient verbalized understanding and had no concerns at the end of the call.  Orders in epic for Feraheme 510 mg IV once and then repeat 1 week later to have done at the patient care center.   Ambulatory referral in epic to Hematology.

## 2020-09-22 ENCOUNTER — Telehealth: Payer: Self-pay | Admitting: Hematology

## 2020-09-22 NOTE — Telephone Encounter (Signed)
Received a new hem referral from Dr. [name] for IDA. James Braun has been cld and scheduled to see Dr. Irene Limbo on 3/14 at 11am. Pt aware to arrive 15 minutes early.

## 2020-09-23 ENCOUNTER — Other Ambulatory Visit: Payer: Self-pay

## 2020-09-23 ENCOUNTER — Encounter: Payer: Self-pay | Admitting: Podiatry

## 2020-09-23 ENCOUNTER — Ambulatory Visit: Payer: PPO | Admitting: Podiatry

## 2020-09-23 DIAGNOSIS — M79676 Pain in unspecified toe(s): Secondary | ICD-10-CM | POA: Diagnosis not present

## 2020-09-23 DIAGNOSIS — Q828 Other specified congenital malformations of skin: Secondary | ICD-10-CM | POA: Diagnosis not present

## 2020-09-23 DIAGNOSIS — N1831 Chronic kidney disease, stage 3a: Secondary | ICD-10-CM

## 2020-09-23 DIAGNOSIS — B351 Tinea unguium: Secondary | ICD-10-CM | POA: Diagnosis not present

## 2020-09-23 DIAGNOSIS — E1142 Type 2 diabetes mellitus with diabetic polyneuropathy: Secondary | ICD-10-CM | POA: Diagnosis not present

## 2020-09-23 NOTE — Progress Notes (Signed)
This patient returns to my office for at risk foot care.  This patient requires this care by a professional since this patient will be at risk due to having  Diabetes and CKD.    This patient is unable to cut nails himself since the patient cannot reach his nails.These nails are painful walking and wearing shoes.  This patient presents for at risk foot care today.  General Appearance  Alert, conversant and in no acute stress.  Vascular  Dorsalis pedis and posterior tibial  pulses are palpable  bilaterally.  Capillary return is within normal limits  bilaterally. Temperature is within normal limits  bilaterally.  Neurologic  Senn-Weinstein monofilament wire test diminished  bilaterally. Muscle power within normal limits bilaterally.  Nails Thick disfigured discolored nails with subungual debris  from hallux to fifth toes bilaterally. No evidence of bacterial infection or drainage bilaterally.  Orthopedic  No limitations of motion  feet .  No crepitus or effusions noted.  No bony pathology or digital deformities noted.  Skin  normotropic skin with no porokeratosis noted bilaterally.  No signs of infections or ulcers noted.   Porokeratosis sub 5th  B/L and left heel.  Onychomycosis  Pain in right toes  Pain in left toes  Porokeratosis  B/L.  Consent was obtained for treatment procedures.   Mechanical debridement of nails 1-5  bilaterally performed with a nail nipper.  Filed with dremel without incident. No infection or ulcer.  Debride callus with # 15 blade.   Return office visit  12 weeks                Told patient to return for periodic foot care and evaluation due to potential at risk complications.   Gardiner Barefoot DPM

## 2020-09-26 NOTE — Progress Notes (Signed)
HEMATOLOGY/ONCOLOGY CONSULTATION NOTE  Date of Service: 09/27/2020  Patient Care Team: [name], MD as PCP - General [name] [name], MD as PCP - Cardiology (Cardiology) Bensimhon, Shaune Pascal, MD as PCP - Advanced Heart Failure (Cardiology) [name], [facility] as Pharmacist (Pharmacist)  CHIEF COMPLAINTS/PURPOSE OF CONSULTATION:  IDA  HISTORY OF PRESENTING ILLNESS:   James Braun is a wonderful 80 y.o. male who has been referred to [place] by Dr. Elijah Birk III, MD for evaluation and management of IDA. The pt reports that he is doing well overall. The pt has a large hiatal hernia that leads to chronic microscopic blood loss. The oral iron was not sufficient enough for the pt. He has recommended Feraheme 510 mg x 2 doses and has stopped the iron tablets. He is here for ongoing monitoring and management of his anemia.   The pt reports that he first found his lower Hgb levels in August 2019 during a routine blood test by his PCP Dr. [name]. The pt was then referred to Dr.[name] as his Hgb decreased further in August 2020, who recommended an endoscopy. The pt then started experiencing chest pains and saw Dr. [name] in December 2020, who instructed him that his heart was still good and the same as the stress test he received in 2019 with him as well.  The pt then reports that Dr. [name] thought he may have GERD and treated him for that. The symptoms got worse and Dr. [name] then performed an endoscopy in April 2021. This showed the pt had a large hiatal hernia and was referred to Dr. [name]. However, the chest pains worsened and the pt did another stress test on Nov 21, 2019 that revealed the pt had a problem with his left ventricle. Dr. [name] then ordered a catheterization on Nov 27, 2019 and discovered the problem could not be corrected by stents. The pt then received a triple bypass surgery on Nov 28, 2019 by Dr. [name].  The pt recovered from the surgery and had to cancel his  appointments regarding follow up of his hernia with Dr. [name]. The pt had a colonoscopy in October 2021 that found no problems related to blood loss in the colon area. The pt then notes his Hgb was improving with his next visit with his PCP Dr. [name], who suggested postponement of GI f/u until March 2022. This showed a drop in his Hgb to around 8.9 from counts prior in the 12. Since then, the pt was referred here and was put on Omeprazole to reduce the acid in his stomach. The pt reports that he has been feeling much better since taking this.   The pt notes he always has difficulty with getting blood drawn and finding veins for blood, IV, etc. The pt notes he has only needed blood transfusion during the heart surgery, denying any other times. The pt has been on the Metformin for approximately 10-15 years. The pt notes the allergies on file are accurate, but states he does not believe he is allergic to Morphine.  Lab results 09/17/2020 of CBC is as follows: all values are WNL except for RBC of 2.99, Hgb of 8.7, HCT of 27.1, RDW of 18.0.  09/17/2020 Iron of 26, Sat Ratio of 5.2, Ferritin of 6.6.  On review of systems, pt denies sudden weight loss, changes in bowel habits, blood in stools, abdominal pain, back pain, leg swelling, SOB, and any other symptoms.   MEDICAL HISTORY:  Past  Medical History:  Diagnosis Date  . BPH (benign prostatic hypertrophy)   . Cancer ([place])    melanoma  . Coronary artery disease   . History of kidney stones   . History of melanoma excision OCT 2010  . Hyperlipidemia   . Hypertension   . Left ureteral calculus   . Nephrolithiasis    bilateral  . Type 2 diabetes mellitus ([place])     SURGICAL HISTORY: Past Surgical History:  Procedure Laterality Date  . CARDIAC CATHETERIZATION    . CHOLECYSTECTOMY  july 2000  . CORONARY ARTERY BYPASS GRAFT N/A 11/28/2019   Procedure: CORONARY ARTERY BYPASS GRAFTING (CABG) x3, using left internal mammary artery and Saphenous vein  harvested endoscopically;  Surgeon: [name], MD;  Location: [place];  Service: Open Heart Surgery;  Laterality: N/A;  . CYSTOSCOPY WITH STENT PLACEMENT Left 11/14/2012   Procedure: CYSTOSCOPY WITH STENT PLACEMENT;  Surgeon: [name], MD;  Location: [facility];  Service: Urology;  Laterality: Left;  . CYSTOSCOPY/RETROGRADE/URETEROSCOPY/STONE EXTRACTION WITH BASKET Left 11/14/2012   Procedure: CYSTOSCOPY/RETROGRADE/URETEROSCOPY/STONE EXTRACTION WITH BASKET;  Surgeon: [name], MD;  Location[facility];  Service: Urology;  Laterality: Left;  . EXTRACORPOREAL SHOCK WAVE LITHOTRIPSY  JULY 2000   X2  . HOLMIUM LASER APPLICATION Left 0/01/8674   Procedure: HOLMIUM LASER APPLICATION;  Surgeon: [name], MD;  Location: [facility];  Service: Urology;  Laterality: Left;  . LEFT HEART CATH AND CORONARY ANGIOGRAPHY N/A 11/27/2019   Procedure: LEFT HEART CATH AND CORONARY ANGIOGRAPHY;  Surgeon: [name], MD;  Location: [place] [facility];  Service: Cardiovascular;  Laterality: N/A;  . LEFT URETEROSCOPIC STONE EXTRACTION  02-22-2007  . NEPHROLITHOTOMY Right 09-28-2010   percutaneous  . ORIF CLAVICLE FRACTURE  2006  . PROSTATE BIOPSY  1998   neg  . SHOULDER ARTHROSCOPY  1/15   Dr [name]  . TEE WITHOUT CARDIOVERSION N/A 11/28/2019   Procedure: TRANSESOPHAGEAL ECHOCARDIOGRAM (TEE);  Surgeon: [name], Collier Salina, MD;  Location: [place];  Service: Open Heart Surgery;  Laterality: N/A;  . TONSILLECTOMY      SOCIAL HISTORY: Social History   Socioeconomic History  . Marital status: Married    Spouse name: Not on file  . Number of children: 2  . Years of education: Not on file  . Highest education level: Not on file  Occupational History  . 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 Occupation: Buys very distressed houses and rehabilitates  . Occupation: Does mediation  Tobacco Use  . Smoking status: Never Smoker  .  Smokeless tobacco: Never Used  Substance and Sexual Activity  . Alcohol use: No  . Drug use: No  . Sexual activity: Not on file  Other Topics Concern  . Not on file  Social History Narrative   Has living will   Wife is health care POA---alternate is committee of physicians   Would accept resuscitation---wouldn't accept prolonged ventilation   No tube feeds if cognitively unaware   Right handed   Social Determinants of Health   Financial Resource Strain: Low Risk   . Difficulty of Paying Living Expenses: Not hard at all  Food Insecurity: Not on file  Transportation Needs: Not on file  Physical Activity: Not on file  Stress: Not on file  Social Connections: Not on file  Intimate Partner Violence: Not on file    FAMILY HISTORY: Family History  Problem Relation Age of Onset  . Hypertension Mother   . Diabetes Father   .  Hypertension Father   . Colon polyps Brother   . Coronary artery disease Neg Hx   . Cancer Neg Hx   . Esophageal cancer Neg Hx   . Stomach cancer Neg Hx   . Rectal cancer Neg Hx     ALLERGIES:  is allergic to atorvastatin, morphine and related, levofloxacin, septra [sulfamethoxazole-trimethoprim], and sulfamethoxazole-trimethoprim.  MEDICATIONS:  Current Outpatient Medications  Medication Sig Dispense Refill  . alum hydroxide-mag trisilicate (GAVISCON) 37-85 MG CHEW chewable tablet Chew 2 tablets by mouth 3 (three) times daily as needed for indigestion or heartburn.    Marland Kitchen aspirin EC 81 MG EC tablet Take 1 tablet (81 mg total) by mouth daily.    . carvedilol (COREG) 3.125 MG tablet Take 1 tablet (3.125 mg total) by mouth 2 (two) times daily. 60 tablet 6  . Cyanocobalamin (VITAMIN B-12) 1000 MCG SUBL Place 2,000 mcg under the tongue. 1 BID    . ferrous sulfate 325 (65 FE) MG tablet Take 325 mg by mouth daily with breakfast.    . finasteride (PROSCAR) 5 MG tablet Take 1 tablet (5 mg total) by mouth daily. 90 tablet 3  . furosemide (LASIX) 20 MG tablet TAKE 1  TABLET (20 MG TOTAL) BY MOUTH DAILY. DAILY, AS NEEDED FOR LOWER LEG SWELLING. 30 tablet 0  . metFORMIN (GLUCOPHAGE-XR) 500 MG 24 hr tablet Take 1 tablet (500 mg total) by mouth 2 (two) times daily. 180 tablet 3  . Multiple Vitamin (MULTIVITAMIN) tablet Take 1 tablet by mouth daily.    Marland Kitchen omeprazole (PRILOSEC) 20 MG capsule Take 20 mg by mouth at bedtime.    . Potassium Citrate 15 MEQ (1620 MG) TBCR TAKE 1 TABLET BY MOUTH 3 TIMES A DAY 270 tablet 3  . rosuvastatin (CRESTOR) 10 MG tablet Take 10 mg by mouth daily.    . sacubitril-valsartan (ENTRESTO) 24-26 MG Take 1 tablet by mouth 2 (two) times daily. 180 tablet 3  . spironolactone (ALDACTONE) 25 MG tablet TAKE 1 TABLET BY MOUTH EVERY DAY 30 tablet 3  . timolol (TIMOPTIC) 0.5 % ophthalmic solution Place 1 drop into both eyes 2 (two) times daily.     . Vitamin D3 (VITAMIN D) 25 MCG tablet Take 1 tablet by mouth 2 (two) times daily.     No current facility-administered medications for this visit.    REVIEW OF SYSTEMS:   10 Point review of Systems was done is negative except as noted above.  PHYSICAL EXAMINATION: ECOG PERFORMANCE STATUS: 1 - Symptomatic but completely ambulatory  . Vitals:   09/27/20 1058  BP: 127/74  Pulse: 82  Resp: 14  Temp: 97.8 F (36.6 C)  SpO2: 96%   Filed Weights   09/27/20 1058  Weight: 165 lb 14.4 oz (75.3 kg)   .Body mass index is 25.98 kg/m.  GENERAL:alert, in no acute distress and comfortable SKIN: no acute rashes, no significant lesions EYES: conjunctiva are pink and non-injected, sclera anicteric OROPHARYNX: MMM, no exudates, no oropharyngeal erythema or ulceration NECK: supple, no JVD LYMPH:  no palpable lymphadenopathy in the cervical, axillary or inguinal regions LUNGS: clear to auscultation b/l with normal respiratory effort HEART: regular rate & rhythm ABDOMEN:  normoactive bowel sounds , non tender, not distended. Extremity: no pedal edema PSYCH: alert & oriented x 3 with fluent  speech NEURO: no focal motor/sensory deficits  LABORATORY DATA:  I have reviewed the data as listed  . CBC Latest Ref Rng & Units 09/17/2020 09/07/2020 03/17/2020  WBC 4.0 - 10.5 K/uL  5.1 5.4 5.8  Hemoglobin 13.0 - 17.0 g/dL 8.7(L) 9.8(L) 12.0(L)  Hematocrit 39.0 - 52.0 % 27.1(L) 30.9(L) 37.5(L)  Platelets 150.0 - 400.0 K/uL 385.0 381.0 377.0    . CMP Latest Ref Rng & Units 09/07/2020 04/23/2020 03/17/2020  Glucose 70 - 99 mg/dL 190(H) 125(H) 141(H)  BUN 6 - 23 mg/dL 29(H) 26(H) 25(H)  Creatinine 0.40 - 1.50 mg/dL 1.24 1.53(H) 1.31  Sodium 135 - 145 mEq/L 140 144 138  Potassium 3.5 - 5.1 mEq/L 3.9 3.6 4.5  Chloride 96 - 112 mEq/L 106 109 103  CO2 19 - 32 mEq/L 26 29 28   Calcium 8.4 - 10.5 mg/dL 9.2 9.4 9.3  Total Protein 6.0 - 8.3 g/dL - 7.0 6.5  Total Bilirubin 0.2 - 1.2 mg/dL - 0.3 0.5  Alkaline Phos 39 - 117 U/L - 41 49  AST 0 - 37 U/L - 22 15  ALT 0 - 53 U/L - 16 13     RADIOGRAPHIC STUDIES: I have personally reviewed the radiological images as listed and agreed with the findings in the report. No results found.  ASSESSMENT & PLAN:   80 yo with   1) Severe Iron deficiency Anemia likely due to recurrent/chronic GI bleeding likely from [facility] ulcers related to large hiatal hernia. PLAN: -Recommended pt eat smaller, more frequent meals as opposed to larger. -Recommended pt drink water 1 hour prior and 1 hour post meals. Avoid water close and during mealtime. -Continue the Omeprazole to aid with acid suppression due to the hernia and the abdominal distention. -Advised pt that routine IV iron is normal when dealing with a hernia that is bleeding, even if not noticeable. This is more common when symptoms not bothersome enough for surgery or pt not able to have surgery due to other risks (I.e. pt's heart condition as in this case). -Advised pt that the cause for iron deficiency anemia is known to be GI bleeding, most likely from hiatal hernia. There is no indication or evidence for  bone marrow disorder or solely decreased absorption. -Advised pt that he will need 1-1.5g of IV iron now to try to rebuild stores. Will schedule for IV Injectafer if insurance allows. The pt is currently not scheduled for any appointments yet.  -Advised pt that Shirlean Kelly is what [facility] keeps. The pt can contact Dr. [name] to see if this is already approved or what his plan is based on notes that suggested he was ordering it.  -Advised pt that Christeen Douglas is what we will try to get. If not, the pt can contact Dr. [name] regarding Feraheme due to less amounts of infusions and quicker ( 2 weeks versus 4-5).  -Advised pt that taking the aggressive approach for surgery is personal choice. Could observe if acid suppressants and IV iron will alleviate pain and bleeding enough to not require surgery at this time due to recent cardiac issues.  -Advised pt that some of the heart function could improve over time with medications. Recommended pt avoid any surgeries at least 6 months after, preferably 1 year.  -Advised pt to be aware of pros and cons of potential surgery. Talk to Cardiologist and surgeon regarding their opinions on the matter. -Continue Baby ASA daily. -Continue Vitamin B12 daily. -Will get IV Injectafer in 1-2 weeks x 2 doses. -Will see back in 6 weeks with labs. -The pt notes that he is not available for any appointments this week on Thursday and Friday (3/17 and 3/18).   FOLLOW UP:  IV Injectafer weekly x 2 doses ASAP RTC with Dr Irene Limbo with labs in 6 weeks    All of the patients questions were answered with apparent satisfaction. The patient knows to call the clinic with any problems, questions or concerns.  I spent 40 minutes counseling the patient face to face. The total time spent in the appointment was 60 minutes and more than 50% was on counseling and direct patient cares.    Sullivan Lone MD [place] AAHIVMS [facility] Hematology/Oncology Physician [facility]  (Office):       [phone] (Work cell):  [phone] (Fax):           [phone]  09/27/2020 11:48 AM  I, Reinaldo Raddle, am acting as scribe for Dr. Sullivan Lone, MD.   .I have reviewed the above documentation for accuracy and completeness, and I agree with the above. Brunetta Genera MD

## 2020-09-27 ENCOUNTER — Inpatient Hospital Stay: Payer: PPO | Attending: Hematology | Admitting: Hematology

## 2020-09-27 ENCOUNTER — Other Ambulatory Visit: Payer: Self-pay

## 2020-09-27 VITALS — BP 127/74 | HR 82 | Temp 97.8°F | Resp 14 | Ht 67.0 in | Wt 165.9 lb

## 2020-09-27 DIAGNOSIS — Z7984 Long term (current) use of oral hypoglycemic drugs: Secondary | ICD-10-CM | POA: Insufficient documentation

## 2020-09-27 DIAGNOSIS — Z7982 Long term (current) use of aspirin: Secondary | ICD-10-CM | POA: Diagnosis not present

## 2020-09-27 DIAGNOSIS — K449 Diaphragmatic hernia without obstruction or gangrene: Secondary | ICD-10-CM | POA: Diagnosis not present

## 2020-09-27 DIAGNOSIS — Z79899 Other long term (current) drug therapy: Secondary | ICD-10-CM | POA: Insufficient documentation

## 2020-09-27 DIAGNOSIS — K922 Gastrointestinal hemorrhage, unspecified: Secondary | ICD-10-CM | POA: Insufficient documentation

## 2020-09-27 DIAGNOSIS — D500 Iron deficiency anemia secondary to blood loss (chronic): Secondary | ICD-10-CM | POA: Diagnosis not present

## 2020-09-27 DIAGNOSIS — E119 Type 2 diabetes mellitus without complications: Secondary | ICD-10-CM | POA: Insufficient documentation

## 2020-09-29 ENCOUNTER — Telehealth: Payer: Self-pay | Admitting: Hematology

## 2020-09-29 NOTE — Telephone Encounter (Signed)
Scheduled follow-up appointments per 3/14 los. Patient is aware. ?

## 2020-10-01 ENCOUNTER — Inpatient Hospital Stay: Payer: PPO | Attending: Gastroenterology

## 2020-10-01 ENCOUNTER — Other Ambulatory Visit: Payer: Self-pay

## 2020-10-01 VITALS — BP 111/68 | HR 85 | Temp 96.8°F | Resp 20

## 2020-10-01 DIAGNOSIS — D500 Iron deficiency anemia secondary to blood loss (chronic): Secondary | ICD-10-CM | POA: Diagnosis not present

## 2020-10-01 DIAGNOSIS — K922 Gastrointestinal hemorrhage, unspecified: Secondary | ICD-10-CM | POA: Diagnosis not present

## 2020-10-01 MED ORDER — SODIUM CHLORIDE 0.9 % IV SOLN
750.0000 mg | Freq: Once | INTRAVENOUS | Status: AC
  Administered 2020-10-01: 750 mg via INTRAVENOUS
  Filled 2020-10-01: qty 15

## 2020-10-01 MED ORDER — ACETAMINOPHEN 325 MG PO TABS
650.0000 mg | ORAL_TABLET | Freq: Once | ORAL | Status: AC
  Administered 2020-10-01: 650 mg via ORAL
  Filled 2020-10-01: qty 2

## 2020-10-01 MED ORDER — SODIUM CHLORIDE 0.9 % IV SOLN
Freq: Once | INTRAVENOUS | Status: AC
  Filled 2020-10-01: qty 250

## 2020-10-01 MED ORDER — LORATADINE 10 MG PO TABS
10.0000 mg | ORAL_TABLET | Freq: Once | ORAL | Status: AC
  Administered 2020-10-01: 10 mg via ORAL
  Filled 2020-10-01: qty 1

## 2020-10-01 NOTE — Progress Notes (Signed)
Tolerated first Injectafer infusion well. Stable at discharge

## 2020-10-08 ENCOUNTER — Other Ambulatory Visit: Payer: Self-pay

## 2020-10-08 ENCOUNTER — Inpatient Hospital Stay: Payer: PPO

## 2020-10-08 VITALS — BP 123/69 | HR 87 | Temp 98.0°F | Resp 18

## 2020-10-08 DIAGNOSIS — D500 Iron deficiency anemia secondary to blood loss (chronic): Secondary | ICD-10-CM

## 2020-10-08 MED ORDER — ACETAMINOPHEN 325 MG PO TABS
650.0000 mg | ORAL_TABLET | Freq: Once | ORAL | Status: AC
  Administered 2020-10-08: 650 mg via ORAL

## 2020-10-08 MED ORDER — ACETAMINOPHEN 325 MG PO TABS
ORAL_TABLET | ORAL | Status: AC
  Filled 2020-10-08: qty 2

## 2020-10-08 MED ORDER — SODIUM CHLORIDE 0.9 % IV SOLN
750.0000 mg | Freq: Once | INTRAVENOUS | Status: AC
  Administered 2020-10-08: 750 mg via INTRAVENOUS
  Filled 2020-10-08: qty 15

## 2020-10-08 MED ORDER — LORATADINE 10 MG PO TABS
10.0000 mg | ORAL_TABLET | Freq: Once | ORAL | Status: AC
  Administered 2020-10-08: 10 mg via ORAL

## 2020-10-08 MED ORDER — LORATADINE 10 MG PO TABS
ORAL_TABLET | ORAL | Status: AC
  Filled 2020-10-08: qty 1

## 2020-10-08 MED ORDER — SODIUM CHLORIDE 0.9 % IV SOLN
Freq: Once | INTRAVENOUS | Status: AC
Start: 2020-10-08 — End: 2020-10-08
  Filled 2020-10-08: qty 250

## 2020-10-08 NOTE — Patient Instructions (Signed)
Ferric carboxymaltose injection What is this medicine? FERRIC CARBOXYMALTOSE (ferr-ik car-box-ee-mol-toes) is an iron complex. Iron is used to make healthy red blood cells, which carry oxygen and nutrients throughout the body. This medicine is used to treat anemia in people with chronic kidney disease or people who cannot take iron by mouth. This medicine may be used for other purposes; ask your health care provider or pharmacist if you have questions. COMMON BRAND NAME(S): Injectafer What should I tell my health care provider before I take this medicine? They need to know if you have any of these conditions:  high levels of iron in the blood  liver disease  an unusual or allergic reaction to iron, other medicines, foods, dyes, or preservatives  pregnant or trying to get pregnant  breast-feeding How should I use this medicine? This medicine is for infusion into a vein. It is given by a health care professional in a hospital or clinic setting. Talk to your pediatrician regarding the use of this medicine in children. Special care may be needed. Overdosage: If you think you have taken too much of this medicine contact a poison control center or emergency room at once. NOTE: This medicine is only for you. Do not share this medicine with others. What if I miss a dose? Keep appointments for follow-up doses. It is important not to miss your dose. Call your care team if you are unable to keep an appointment. What may interact with this medicine? Do not take this medicine with any of the following medications:  deferoxamine  dimercaprol  other iron products This list may not describe all possible interactions. Give your health care provider a list of all the medicines, herbs, non-prescription drugs, or dietary supplements you use. Also tell them if you smoke, drink alcohol, or use illegal drugs. Some items may interact with your medicine. What should I watch for while using this  medicine? Visit your doctor or health care professional regularly. Tell your doctor if your symptoms do not start to get better or if they get worse. You may need blood work done while you are taking this medicine. You may need to follow a special diet. Talk to your doctor. Foods that contain iron include: whole grains/cereals, dried fruits, beans, or peas, leafy green vegetables, and organ meats (liver, kidney). What side effects may I notice from receiving this medicine? Side effects that you should report to your doctor or health care professional as soon as possible:  allergic reactions like skin rash, itching or hives, swelling of the face, lips, or tongue  dizziness  facial flushing Side effects that usually do not require medical attention (report to your doctor or health care professional if they continue or are bothersome):  changes in taste  constipation  headache  nausea, vomiting  pain, redness, or irritation at site where injected This list may not describe all possible side effects. Call your doctor for medical advice about side effects. You may report side effects to FDA at 1-800-FDA-1088. Where should I keep my medicine? This drug is given in a hospital or clinic and will not be stored at home. NOTE: This sheet is a summary. It may not cover all possible information. If you have questions about this medicine, talk to your doctor, pharmacist, or health care provider.  2021 Elsevier/Gold Standard (2020-06-15 14:00:47)  

## 2020-10-13 ENCOUNTER — Other Ambulatory Visit: Payer: Self-pay | Admitting: Hematology

## 2020-10-22 ENCOUNTER — Telehealth: Payer: Self-pay

## 2020-10-22 NOTE — Chronic Care Management (AMB) (Addendum)
Chronic Care Management Pharmacy Assistant   Name: James Braun  MRN: [id number] DOB: 08-18-1940   Reason for Encounter: Disease State- CHF   Conditions to be addressed/monitored: CHF   Recent office visits:  09/17/20- Dr. [name]- PCP - Labs ordered: CBC, IBC + Ferritin, Vitamin B12, Folate  Recent consult visits:  10/12/20- Dr. Doyce Para- General Surgery- Eye 09/27/20- Dr. Sullivan Lone- Oncology - Labs ordered CBC with diff, CMP, Ferritin, Iron and TIBC 09/23/20- Dr. Gardiner Barefoot- Podiatry 09/17/20- Telephone encounter to switch providers from Dr. [name] to Dr. Marlou Porch cardiology 09/13/20- Dr. Haroldine Laws- Cardiology  [facility] visits:  06/11/20- ED visit- Cellulitis  Medications: Outpatient Encounter Medications as of 10/22/2020  Medication Sig   alum hydroxide-mag trisilicate (GAVISCON) 48-01 MG CHEW chewable tablet Chew 2 tablets by mouth 3 (three) times daily as needed for indigestion or heartburn.   aspirin EC 81 MG EC tablet Take 1 tablet (81 mg total) by mouth daily.   carvedilol (COREG) 3.125 MG tablet Take 1 tablet (3.125 mg total) by mouth 2 (two) times daily.   Cyanocobalamin (VITAMIN B-12) 1000 MCG SUBL Place 2,000 mcg under the tongue. 1 BID   ferrous sulfate 325 (65 FE) MG tablet Take 325 mg by mouth daily with breakfast.   finasteride (PROSCAR) 5 MG tablet Take 1 tablet (5 mg total) by mouth daily.   furosemide (LASIX) 20 MG tablet TAKE 1 TABLET (20 MG TOTAL) BY MOUTH DAILY. DAILY, AS NEEDED FOR LOWER LEG SWELLING.   metFORMIN (GLUCOPHAGE-XR) 500 MG 24 hr tablet Take 1 tablet (500 mg total) by mouth 2 (two) times daily.   Multiple Vitamin (MULTIVITAMIN) tablet Take 1 tablet by mouth daily.   omeprazole (PRILOSEC) 20 MG capsule Take 20 mg by mouth at bedtime.   Potassium Citrate 15 MEQ (1620 MG) TBCR TAKE 1 TABLET BY MOUTH 3 TIMES A DAY   rosuvastatin (CRESTOR) 10 MG tablet Take 10 mg by mouth daily.   sacubitril-valsartan (ENTRESTO) 24-26 MG Take 1 tablet by mouth 2  (two) times daily.   spironolactone (ALDACTONE) 25 MG tablet TAKE 1 TABLET BY MOUTH EVERY DAY   timolol (TIMOPTIC) 0.5 % ophthalmic solution Place 1 drop into both eyes 2 (two) times daily.    Vitamin D3 (VITAMIN D) 25 MCG tablet Take 1 tablet by mouth 2 (two) times daily.   No facility-administered encounter medications on file as of 10/22/2020.   Contacted patient regarding CHF  Current heart failure regimen: Lasix 20 mg - 1 tablet daily PRN - (Not taking often) Potassium Citrate Er 15 mEq - 1 tablet TID (Pt taking BID - provider aware) Entresto 24/26 mg - 1 tablet BID  Carvedilol 3.125 mg - 1 tablet twice daily with meals Spironolactone 25 mg - 1 tablet daily   Are you taking a diuretic? Yes How often: States not very often. Not having any swelling.   Do you weigh yourself daily or regularly? Yes- every other day  Have any of the following symptoms worsened or changed from your baseline?   denies worsening of SOB, increased swelling, or abnormal weight gain of more than 3 pounds in one day or 5 pounds in one week  Do you see a cardiologist? Yes             Name: Dr. Marlou Porch Upcoming visit? 01/25/21  How often are you checking your Blood Pressure? infrequently  he checks his blood pressure in the morning after taking his medication.   DATE:  BP               PULSE -  110/70  -    Wrist or arm cuff: Arm Caffeine intake: Coffee in the morning Salt intake: Tries to limit. Does still use OTC medications including pseudoephedrine or NSAIDs? No Exercise habits: No formal exercise. Active around the house.    Star Rating Drugs:  Medication:  Last Fill: Day Supply Metformin 500 mg 10/04/20 90 Rosuvastatin 10 mg 08/10/20 30 Entresto 24-26 mg 10/04/20 90  Past due for refill on rosuvastatin.   Patient states he became anemic and had 2 infusions which helped his energy. He is now in the process of trying to figure out where the blood loss is coming from. He has already  had a colonoscopy and endoscopy which showed he had a hiatal hernia. He states omeprazole helps with this. When he stays on omeprazole his stools look normal but when he stops his stools become dark. He states he has a follow up 11/08/20 with GI.   [name], CPP notified  Margaretmary Dys, [place] [facility] Assistant [phone]  I have reviewed the care management and care coordination activities outlined in this encounter and I am certifying that I agree with the content of this note. Will follow up to review statin adherence next month.   [name], PharmD Clinical Pharmacist [facility] [phone]

## 2020-10-30 ENCOUNTER — Other Ambulatory Visit (HOSPITAL_COMMUNITY): Payer: Self-pay | Admitting: Cardiology

## 2020-10-30 ENCOUNTER — Other Ambulatory Visit: Payer: Self-pay | Admitting: Internal Medicine

## 2020-11-01 NOTE — Telephone Encounter (Signed)
Rx has been sent to the pharmacy electronically. ° °

## 2020-11-07 NOTE — Progress Notes (Signed)
HEMATOLOGY/ONCOLOGY CONSULTATION NOTE  Date of Service: 11/08/2020  Patient Care Team: Karie Schwalbe, MD as PCP - General Allyson Sabal Delton See, MD as PCP - Cardiology (Cardiology) Bensimhon, Bevelyn Buckles, MD as PCP - Advanced Heart Failure (Cardiology) [name], [facility] as Pharmacist (Pharmacist)  CHIEF COMPLAINTS/PURPOSE OF CONSULTATION:  IDA  HISTORY OF PRESENTING ILLNESS:   James Braun is a wonderful 80 y.o. male who has been referred to [place] by Dr. Deatra Canter III, MD for evaluation and management of IDA. The pt reports that he is doing well overall. The pt has a large hiatal hernia that leads to chronic microscopic blood loss. The oral iron was not sufficient enough for the pt. He has recommended Feraheme 510 mg x 2 doses and has stopped the iron tablets. He is here for ongoing monitoring and management of his anemia.   The pt reports that he first found his lower Hgb levels in August 2019 during a routine blood test by his PCP Dr. Alphonsus Sias. The pt was then referred to Dr.[name] as his Hgb decreased further in August 2020, who recommended an endoscopy. The pt then started experiencing chest pains and saw Dr. Allyson Sabal in December 2020, who instructed him that his heart was still good and the same as the stress test he received in 2019 with him as well.  The pt then reports that Dr. Alphonsus Sias thought he may have GERD and treated him for that. The symptoms got worse and Dr. Myrtie Neither then performed an endoscopy in April 2021. This showed the pt had a large hiatal hernia and was referred to Dr. Brita Romp. However, the chest pains worsened and the pt did another stress test on Nov 21, 2019 that revealed the pt had a problem with his left ventricle. Dr. Allyson Sabal then ordered a catheterization on Nov 27, 2019 and discovered the problem could not be corrected by stents. The pt then received a triple bypass surgery on Nov 28, 2019 by Dr. Donata Clay.  The pt recovered from the surgery and had to cancel his  appointments regarding follow up of his hernia with Dr. Myrtie Neither. The pt had a colonoscopy in October 2021 that found no problems related to blood loss in the colon area. The pt then notes his Hgb was improving with his next visit with his PCP Dr. Alphonsus Sias, who suggested postponement of GI f/u until March 2022. This showed a drop in his Hgb to around 8.9 from counts prior in the 12. Since then, the pt was referred here and was put on Omeprazole to reduce the acid in his stomach. The pt reports that he has been feeling much better since taking this.   The pt notes he always has difficulty with getting blood drawn and finding veins for blood, IV, etc. The pt notes he has only needed blood transfusion during the heart surgery, denying any other times. The pt has been on the Metformin for approximately 10-15 years. The pt notes the allergies on file are accurate, but states he does not believe he is allergic to Morphine.  Lab results 09/17/2020 of CBC is as follows: all values are WNL except for RBC of 2.99, Hgb of 8.7, HCT of 27.1, RDW of 18.0.  09/17/2020 Iron of 26, Sat Ratio of 5.2, Ferritin of 6.6.  On review of systems, pt denies sudden weight loss, changes in bowel habits, blood in stools, abdominal pain, back pain, leg swelling, SOB, and any other symptoms.  INTERVAL HISTORY  James Pastran  Braun is a wonderful 80 y.o. male who is here today for f/u regarding evaluation and management of IDA. The patient's last visit with [place] was on 09/27/2020. The pt reports that he is doing well overall.  The pt reports that he has been feeling much better since the IV iron. The pt notes that he started taking Omeprazole and this helps turn his stools a much lighter, normal brown. The pt notes that his stools are very dark when he does not take this. The pt saw his GI this morning and he thinks some blood loss is due to the pt's hiatal hernia.  Lab results today 11/08/2020 of CBC w/diff and CMP is as follows: all values are  WNL except for RDW of 18.4, Lymphs Abs of 0.6K, Glucose of 176. 11/08/2020 Iron of 41, Sat Ratio of 13. 11/08/2020 Ferritin of 160.  On review of systems, pt denies bloody/black stools, fatigue, leg swelling, and any other symptoms.  MEDICAL HISTORY:  Past Medical History:  Diagnosis Date  . BPH (benign prostatic hypertrophy)   . Cancer ([place])    melanoma  . Coronary artery disease   . History of kidney stones   . History of melanoma excision OCT 2010  . Hyperlipidemia   . Hypertension   . Left ureteral calculus   . Nephrolithiasis    bilateral  . Type 2 diabetes mellitus ([place])     SURGICAL HISTORY: Past Surgical History:  Procedure Laterality Date  . CARDIAC CATHETERIZATION    . CHOLECYSTECTOMY  july 2000  . CORONARY ARTERY BYPASS GRAFT N/A 11/28/2019   Procedure: CORONARY ARTERY BYPASS GRAFTING (CABG) x3, using left internal mammary artery and Saphenous vein harvested endoscopically;  Surgeon: [name], MD;  Location: [facility];  Service: Open Heart Surgery;  Laterality: N/A;  . CYSTOSCOPY WITH STENT PLACEMENT Left 11/14/2012   Procedure: CYSTOSCOPY WITH STENT PLACEMENT;  Surgeon: [name], MD;  Location: [facility];  Service: Urology;  Laterality: Left;  . CYSTOSCOPY/RETROGRADE/URETEROSCOPY/STONE EXTRACTION WITH BASKET Left 11/14/2012   Procedure: CYSTOSCOPY/RETROGRADE/URETEROSCOPY/STONE EXTRACTION WITH BASKET;  Surgeon: [name], MD;  Location: [facility];  Service: Urology;  Laterality: Left;  . EXTRACORPOREAL SHOCK WAVE LITHOTRIPSY  JULY 2000   X2  . HOLMIUM LASER APPLICATION Left [id number]   Procedure: HOLMIUM LASER APPLICATION;  Surgeon: [name], MD;  Location: [facility];  Service: Urology;  Laterality: Left;  . LEFT HEART CATH AND CORONARY ANGIOGRAPHY N/A 11/27/2019   Procedure: LEFT HEART CATH AND CORONARY ANGIOGRAPHY;  Surgeon: [name], MD;  Location: [facility];  Service:  Cardiovascular;  Laterality: N/A;  . LEFT URETEROSCOPIC STONE EXTRACTION  02-22-2007  . NEPHROLITHOTOMY Right 09-28-2010   percutaneous  . ORIF CLAVICLE FRACTURE  2006  . PROSTATE BIOPSY  1998   neg  . SHOULDER ARTHROSCOPY  1/15   Dr [name]  . TEE WITHOUT CARDIOVERSION N/A 11/28/2019   Procedure: TRANSESOPHAGEAL ECHOCARDIOGRAM (TEE);  Surgeon: [name], Collier Salina, MD;  Location: [facility];  Service: Open Heart Surgery;  Laterality: N/A;  . TONSILLECTOMY      SOCIAL HISTORY: Social History   Socioeconomic History  . Marital status: Married    Spouse name: Not on file  . Number of children: 2  . Years of education: Not on file  . Highest education level: Not on file  Occupational History  . 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 Occupation: Buys very distressed houses and rehabilitates  . Occupation: Does mediation  Tobacco Use  . Smoking status: Never Smoker  . Smokeless tobacco: Never Used  Substance and Sexual Activity  . Alcohol use: No  . Drug use: No  . Sexual activity: Not on file  Other Topics Concern  . Not on file  Social History Narrative   Has living will   Wife is health care POA---alternate is committee of physicians   Would accept resuscitation---wouldn't accept prolonged ventilation   No tube feeds if cognitively unaware   Right handed   Social Determinants of Health   Financial Resource Strain: Low Risk   . Difficulty of Paying Living Expenses: Not hard at all  Food Insecurity: Not on file  Transportation Needs: Not on file  Physical Activity: Not on file  Stress: Not on file  Social Connections: Not on file  Intimate Partner Violence: Not on file    FAMILY HISTORY: Family History  Problem Relation Age of Onset  . Hypertension Mother   . Diabetes Father   . Hypertension Father   . Colon polyps Brother   . Coronary artery disease Neg Hx   . Cancer Neg Hx   . Esophageal cancer Neg Hx   . Stomach cancer Neg Hx   . Rectal cancer Neg Hx      ALLERGIES:  is allergic to lipitor [atorvastatin], lisinopril, morphine and related, levofloxacin, septra [sulfamethoxazole-trimethoprim], and sulfamethoxazole-trimethoprim.  MEDICATIONS:  Current Outpatient Medications  Medication Sig Dispense Refill  . alum hydroxide-mag trisilicate (GAVISCON) 16-10 MG CHEW chewable tablet Chew 2 tablets by mouth 3 (three) times daily as needed for indigestion or heartburn.    Marland Kitchen aspirin EC 81 MG EC tablet Take 1 tablet (81 mg total) by mouth daily.    . carvedilol (COREG) 3.125 MG tablet Take 1 tablet (3.125 mg total) by mouth 2 (two) times daily. 60 tablet 6  . Cyanocobalamin (VITAMIN B-12) 1000 MCG SUBL Place 2,000 mcg under the tongue. 1 BID    . ferrous sulfate 325 (65 FE) MG tablet Take 325 mg by mouth daily with breakfast.    . finasteride (PROSCAR) 5 MG tablet Take 1 tablet (5 mg total) by mouth daily. 90 tablet 3  . furosemide (LASIX) 20 MG tablet TAKE 1 TABLET (20 MG TOTAL) BY MOUTH DAILY. DAILY, AS NEEDED FOR LOWER LEG SWELLING. 30 tablet 0  . metFORMIN (GLUCOPHAGE-XR) 500 MG 24 hr tablet Take 1 tablet (500 mg total) by mouth 2 (two) times daily. 180 tablet 3  . Multiple Vitamin (MULTIVITAMIN) tablet Take 1 tablet by mouth daily.    Marland Kitchen omeprazole (PRILOSEC) 20 MG capsule TAKE 1 CAPSULE BY MOUTH EVERY DAY 30 capsule 11  . Potassium Citrate 15 MEQ (1620 MG) TBCR TAKE 1 TABLET BY MOUTH 3 TIMES A DAY 270 tablet 3  . rosuvastatin (CRESTOR) 10 MG tablet Take 10 mg by mouth daily.    . sacubitril-valsartan (ENTRESTO) 24-26 MG Take 1 tablet by mouth 2 (two) times daily. 180 tablet 3  . spironolactone (ALDACTONE) 25 MG tablet TAKE 1 TABLET BY MOUTH EVERY DAY 90 tablet 3  . timolol (TIMOPTIC) 0.5 % ophthalmic solution Place 1 drop into both eyes 2 (two) times daily.     . Vitamin D3 (VITAMIN D) 25 MCG tablet Take 1 tablet by mouth 2 (two) times daily.     No current facility-administered medications for this visit.    REVIEW OF SYSTEMS:   10 Point  review of Systems was done is negative except as noted above.  PHYSICAL EXAMINATION: ECOG PERFORMANCE  STATUS: 1 - Symptomatic but completely ambulatory  . Vitals:   11/08/20 1450  BP: 128/75  Pulse: 92  Resp: 16  Temp: 98.1 F (36.7 C)  SpO2: 95%   Filed Weights   11/08/20 1450  Weight: 165 lb 9.6 oz (75.1 kg)   .Body mass index is 27.56 kg/m.  Exam was given in a chair.   GENERAL:alert, in no acute distress and comfortable SKIN: no acute rashes, no significant lesions EYES: conjunctiva are pink and non-injected, sclera anicteric OROPHARYNX: MMM, no exudates, no oropharyngeal erythema or ulceration NECK: supple, no JVD LYMPH:  no palpable lymphadenopathy in the cervical, axillary or inguinal regions LUNGS: clear to auscultation b/l with normal respiratory effort HEART: regular rate & rhythm ABDOMEN:  normoactive bowel sounds , non tender, not distended. Extremity: no pedal edema PSYCH: alert & oriented x 3 with fluent speech NEURO: no focal motor/sensory deficits  LABORATORY DATA:  I have reviewed the data as listed  . CBC Latest Ref Rng & Units 11/08/2020 09/17/2020 09/07/2020  WBC 4.0 - 10.5 K/uL 5.8 5.1 5.4  Hemoglobin 13.0 - 17.0 g/dL 13.6 8.7(L) 9.8(L)  Hematocrit 39.0 - 52.0 % 43.0 27.1(L) 30.9(L)  Platelets 150 - 400 K/uL 306 385.0 381.0    . CMP Latest Ref Rng & Units 11/08/2020 09/07/2020 04/23/2020  Glucose 70 - 99 mg/dL 176(H) 190(H) 125(H)  BUN 8 - 23 mg/dL 23 29(H) 26(H)  Creatinine 0.61 - 1.24 mg/dL 1.16 1.24 1.53(H)  Sodium 135 - 145 mmol/L 140 140 144  Potassium 3.5 - 5.1 mmol/L 4.2 3.9 3.6  Chloride 98 - 111 mmol/L 105 106 109  CO2 22 - 32 mmol/L 24 26 29   Calcium 8.9 - 10.3 mg/dL 9.4 9.2 9.4  Total Protein 6.5 - 8.1 g/dL 6.9 - 7.0  Total Bilirubin 0.3 - 1.2 mg/dL 0.3 - 0.3  Alkaline Phos 38 - 126 U/L 55 - 41  AST 15 - 41 U/L 17 - 22  ALT 0 - 44 U/L 21 - 16     RADIOGRAPHIC STUDIES: I have personally reviewed the radiological images as  listed and agreed with the findings in the report. No results found.  ASSESSMENT & PLAN:   80 yo with   1) Severe Iron deficiency Anemia likely due to recurrent/chronic GI bleeding likely from [facility] ulcers related to large hiatal hernia.  PLAN: -Discussed pot labwork today, 11/08/2020; not anemic anymore, iron and blood counts improved since IV iron. Chemistries stable. -Advised pt that at this time he does not need any Iron infusions. Will continue to monitor levels for how quickly the blood loss is occurring. -Recommended pt eat smaller, more frequent meals as opposed to larger. -Recommended pt drink water 1 hour prior and 1 hour post meals. Avoid water close and during mealtime. -Continue the Omeprazole to aid with acid suppression due to the hernia and the abdominal distention. -Continue Baby ASA daily. -Continue Vitamin B12 daily. -Will get labs in 1-2 months. The pt wishes to get this done at his PCP. Pt is aware to send labs to [place]. -Goal Ferritin > 100. -Will see back in 3 months with labs.   FOLLOW UP: RTC with Dr Irene Limbo with labs in 12 weeks    All of the patients questions were answered with apparent satisfaction. The patient knows to call the clinic with any problems, questions or concerns.  The total time spent in the appointment was 20 minutes and more than 50% was on counseling and direct patient cares.  Sullivan Lone MD [place] AAHIVMS [facility] Hematology/Oncology Physician [facility]  (Office):       [phone] (Work cell):  [phone] (Fax):           [phone]  11/08/2020 4:12 PM  I, Reinaldo Raddle, am acting as scribe for Dr. Sullivan Lone, MD.   .I have reviewed the above documentation for accuracy and completeness, and I agree with the above. Brunetta Genera MD

## 2020-11-08 ENCOUNTER — Other Ambulatory Visit: Payer: Self-pay

## 2020-11-08 ENCOUNTER — Ambulatory Visit: Payer: PPO | Admitting: Gastroenterology

## 2020-11-08 ENCOUNTER — Encounter: Payer: Self-pay | Admitting: Gastroenterology

## 2020-11-08 ENCOUNTER — Inpatient Hospital Stay: Payer: PPO

## 2020-11-08 ENCOUNTER — Inpatient Hospital Stay: Payer: PPO | Attending: Hematology | Admitting: Hematology

## 2020-11-08 VITALS — BP 122/74 | HR 84 | Ht 65.0 in | Wt 165.4 lb

## 2020-11-08 VITALS — BP 128/75 | HR 92 | Temp 98.1°F | Resp 16 | Ht 65.0 in | Wt 165.6 lb

## 2020-11-08 DIAGNOSIS — D500 Iron deficiency anemia secondary to blood loss (chronic): Secondary | ICD-10-CM

## 2020-11-08 DIAGNOSIS — Z79899 Other long term (current) drug therapy: Secondary | ICD-10-CM | POA: Diagnosis not present

## 2020-11-08 DIAGNOSIS — K449 Diaphragmatic hernia without obstruction or gangrene: Secondary | ICD-10-CM | POA: Diagnosis not present

## 2020-11-08 DIAGNOSIS — D509 Iron deficiency anemia, unspecified: Secondary | ICD-10-CM

## 2020-11-08 DIAGNOSIS — K254 Chronic or unspecified gastric ulcer with hemorrhage: Secondary | ICD-10-CM | POA: Insufficient documentation

## 2020-11-08 LAB — CBC WITH DIFFERENTIAL/PLATELET
Abs Immature Granulocytes: 0.02 10*3/uL (ref 0.00–0.07)
Basophils Absolute: 0.1 10*3/uL (ref 0.0–0.1)
Basophils Relative: 1 %
Eosinophils Absolute: 0.2 10*3/uL (ref 0.0–0.5)
Eosinophils Relative: 3 %
HCT: 43 % (ref 39.0–52.0)
Hemoglobin: 13.6 g/dL (ref 13.0–17.0)
Immature Granulocytes: 0 %
Lymphocytes Relative: 11 %
Lymphs Abs: 0.6 10*3/uL — ABNORMAL LOW (ref 0.7–4.0)
MCH: 29.1 pg (ref 26.0–34.0)
MCHC: 31.6 g/dL (ref 30.0–36.0)
MCV: 91.9 fL (ref 80.0–100.0)
Monocytes Absolute: 0.5 10*3/uL (ref 0.1–1.0)
Monocytes Relative: 9 %
Neutro Abs: 4.4 10*3/uL (ref 1.7–7.7)
Neutrophils Relative %: 76 %
Platelets: 306 10*3/uL (ref 150–400)
RBC: 4.68 MIL/uL (ref 4.22–5.81)
RDW: 18.4 % — ABNORMAL HIGH (ref 11.5–15.5)
WBC: 5.8 10*3/uL (ref 4.0–10.5)
nRBC: 0 % (ref 0.0–0.2)

## 2020-11-08 LAB — CMP (CANCER CENTER ONLY)
ALT: 21 U/L (ref 0–44)
AST: 17 U/L (ref 15–41)
Albumin: 4 g/dL (ref 3.5–5.0)
Alkaline Phosphatase: 55 U/L (ref 38–126)
Anion gap: 11 (ref 5–15)
BUN: 23 mg/dL (ref 8–23)
CO2: 24 mmol/L (ref 22–32)
Calcium: 9.4 mg/dL (ref 8.9–10.3)
Chloride: 105 mmol/L (ref 98–111)
Creatinine: 1.16 mg/dL (ref 0.61–1.24)
GFR, Estimated: >60 mL/min (ref >60)
Glucose, Bld: 176 mg/dL — ABNORMAL HIGH (ref 70–99)
Potassium: 4.2 mmol/L (ref 3.5–5.1)
Sodium: 140 mmol/L (ref 135–145)
Total Bilirubin: 0.3 mg/dL (ref 0.3–1.2)
Total Protein: 6.9 g/dL (ref 6.5–8.1)

## 2020-11-08 LAB — IRON AND TIBC
Iron: 41 ug/dL — ABNORMAL LOW (ref 42–163)
Saturation Ratios: 13 % — ABNORMAL LOW (ref 20–55)
TIBC: 316 ug/dL (ref 202–409)
UIBC: 276 ug/dL (ref 117–376)

## 2020-11-08 LAB — FERRITIN: Ferritin: 160 ng/mL (ref 24–336)

## 2020-11-08 LAB — SAMPLE TO BLOOD BANK

## 2020-11-08 NOTE — Patient Instructions (Signed)
If you are age 81 or older, your body mass index should be between 23-30. Your Body mass index is 27.52 kg/m. If this is out of the aforementioned range listed, please consider follow up with your Primary Care Provider.  If you are age 54 or younger, your body mass index should be between 19-25. Your Body mass index is 27.52 kg/m. If this is out of the aformentioned range listed, please consider follow up with your Primary Care Provider.   It was a pleasure to see you today!  Dr. [name]

## 2020-11-08 NOTE — Progress Notes (Addendum)
[facility] Progress Note  Chief Complaint: Iron deficiency anemia  Subjective  History: James Braun was last seen for a colonoscopy in October 2021 for iron deficiency anemia.  In April 2021, I discovered a large hiatal hernia and he was referred to general surgery to consider repair.  He elected not to have that surgery performed.  I suspect he had IDA from intermittent gastric erosions due to the hiatal hernia, with a more distal small bowel source considered less likely.  I did not pursue small bowel video capsule study out of concern it would almost certainly be retained in his herniated stomach and therefore not sufficiently evaluate the small bowel.  (Extensive telephone note from 09/21/2020 about this) He was on oral iron, but it was clear that this was not sufficiently keeping his iron levels with a needed to be.  I recommended he get a dose of Feraheme 510 mg and another dose a week later as well as referral to hematology for ongoing management and monitoring of his anemia.  He was seen in consultation by Dr. Irene Limbo on 09/27/2020, after which James Braun had 2 iron infusions on March 18 March 25.  James Braun was glad to report that his energy level significantly improved soon after getting the second iron infusion.  His stool is intermittently dark on iron tablets.  Curiously, he reports that omeprazole makes his stooling normal color, whereas it then turns dark if he has not taken the medicine.  He denies dysphagia or odynophagia as long as he is cautious about eating.  He does not get heartburn or regurgitation as long as he does not eat after about 6 PM.  In short, diet and lifestyle changes have virtually eliminated any upper digestive symptoms from his large hiatal hernia.  ROS: Cardiovascular:  no chest pain Respiratory: no dyspnea  The patient's Past Medical, Family and Social History were reviewed and are on file in the EMR.  Objective:  Med list reviewed  Current Outpatient Medications:  .   alum hydroxide-mag trisilicate (GAVISCON) 36-62 MG CHEW chewable tablet, Chew 2 tablets by mouth 3 (three) times daily as needed for indigestion or heartburn., Disp: , Rfl:  .  aspirin EC 81 MG EC tablet, Take 1 tablet (81 mg total) by mouth daily., Disp: , Rfl:  .  carvedilol (COREG) 3.125 MG tablet, Take 1 tablet (3.125 mg total) by mouth 2 (two) times daily., Disp: 60 tablet, Rfl: 6 .  Cyanocobalamin (VITAMIN B-12) 1000 MCG SUBL, Place 2,000 mcg under the tongue. 1 BID, Disp: , Rfl:  .  ferrous sulfate 325 (65 FE) MG tablet, Take 325 mg by mouth daily with breakfast., Disp: , Rfl:  .  finasteride (PROSCAR) 5 MG tablet, Take 1 tablet (5 mg total) by mouth daily., Disp: 90 tablet, Rfl: 3 .  metFORMIN (GLUCOPHAGE-XR) 500 MG 24 hr tablet, Take 1 tablet (500 mg total) by mouth 2 (two) times daily., Disp: 180 tablet, Rfl: 3 .  Multiple Vitamin (MULTIVITAMIN) tablet, Take 1 tablet by mouth daily., Disp: , Rfl:  .  omeprazole (PRILOSEC) 20 MG capsule, TAKE 1 CAPSULE BY MOUTH EVERY DAY, Disp: 30 capsule, Rfl: 11 .  Potassium Citrate 15 MEQ (1620 MG) TBCR, TAKE 1 TABLET BY MOUTH 3 TIMES A DAY, Disp: 270 tablet, Rfl: 3 .  rosuvastatin (CRESTOR) 10 MG tablet, Take 10 mg by mouth daily., Disp: , Rfl:  .  sacubitril-valsartan (ENTRESTO) 24-26 MG, Take 1 tablet by mouth 2 (two) times daily., Disp: 180 tablet,  Rfl: 3 .  spironolactone (ALDACTONE) 25 MG tablet, TAKE 1 TABLET BY MOUTH EVERY DAY, Disp: 90 tablet, Rfl: 3 .  timolol (TIMOPTIC) 0.5 % ophthalmic solution, Place 1 drop into both eyes 2 (two) times daily. , Disp: , Rfl:  .  Vitamin D3 (VITAMIN D) 25 MCG tablet, Take 1 tablet by mouth 2 (two) times daily., Disp: , Rfl:  .  furosemide (LASIX) 20 MG tablet, TAKE 1 TABLET (20 MG TOTAL) BY MOUTH DAILY. DAILY, AS NEEDED FOR LOWER LEG SWELLING., Disp: 30 tablet, Rfl: 0   Vital signs in last 24 hrs: Vitals:   11/08/20 0940  BP: 122/74  Pulse: 84   Wt Readings from Last 3 Encounters:  11/08/20 165 lb 6 oz  (75 kg)  09/27/20 165 lb 14.4 oz (75.3 kg)  09/17/20 165 lb (74.8 kg)      No exam.  Entire 20-minute visit spent in discussion.  Labs:  CBC Latest Ref Rng & Units 09/17/2020 09/07/2020 03/17/2020  WBC 4.0 - 10.5 K/uL 5.1 5.4 5.8  Hemoglobin 13.0 - 17.0 g/dL 8.7(L) 9.8(L) 12.0(L)  Hematocrit 39.0 - 52.0 % 27.1(L) 30.9(L) 37.5(L)  Platelets 150.0 - 400.0 K/uL 385.0 381.0 377.0   Iron/TIBC/Ferritin/ %Sat    Component Value Date/Time   IRON 26 (L) 09/17/2020 0924   FERRITIN 6.6 (L) 09/17/2020 0924   IRONPCTSAT 5.2 (L) 09/17/2020 0924    ___________________________________________ Radiologic studies:   ____________________________________________ Other:   _____________________________________________ Assessment & Plan  Assessment: Encounter Diagnoses  Name Primary?  . Iron deficiency anemia, unspecified iron deficiency anemia type Yes  . Hiatal hernia    Chronic occult GI blood loss, most likely gastric erosions from large hiatal hernia, possibly identified more distal small bowel source such as AVM.  Either way, I think the best plan is close monitoring of his hemoglobin and iron levels with hematology and periodic IV iron treatments.  He seems to have little in the way of symptoms from the large hiatal hernia at present.  There is still some risk of aspiration if he does not undergo surgical repair, but if he is cautious with his eating pattern to keep his head of bed elevated, this risk is diminished.  At his age with his heart condition, he would prefer not to have surgery performed and I agree with that.  No further GI specific testing planned right now.  I will be glad to see him as needed  He is having lab work and a follow-up visit with Dr. Irene Limbo later today, so I will be sure to copy my note to him as well as primary care.  20 minutes were spent on this encounter (including chart review, history/exam, counseling/coordination of care, and documentation) > 50% of that  time was spent on counseling and coordination of care.  Topics discussed included: Hiatal hernia management and iron deficiency anemia.  Nelida Meuse III

## 2020-11-09 ENCOUNTER — Telehealth: Payer: Self-pay | Admitting: Hematology

## 2020-11-09 NOTE — Telephone Encounter (Signed)
Scheduled follow-up appointment per 4/25 los. Patient is aware. ?

## 2020-11-19 ENCOUNTER — Telehealth: Payer: Self-pay

## 2020-11-19 NOTE — Chronic Care Management (AMB) (Addendum)
Chronic Care Management Pharmacy Assistant   Name: James Braun  MRN: [id number] DOB: March 22, 1941  Reason for Encounter: Disease State Adherence Call  Recent office visits:  None since last CCM Contact  Recent consult visits:  11/08/2020  Dr.Kale, Oncology, Severe Iron deficiency Anemia likely due to recurrent/chronic GI bleeding - Continue aspirin 81 mg, vitamin B12, and omeprazole 20mg  daily and f/u in 3 months, Labs with PCP. 11/08/2020  Dr.Henry Parks Neptune, Gastroenterology - Hiatal Hernia - No medication changes  Hospital visits:  None in previous 6 months  Medications: Outpatient Encounter Medications as of 11/19/2020  Medication Sig   alum hydroxide-mag trisilicate (GAVISCON) 03-55 MG CHEW chewable tablet Chew 2 tablets by mouth 3 (three) times daily as needed for indigestion or heartburn.   aspirin EC 81 MG EC tablet Take 1 tablet (81 mg total) by mouth daily.   carvedilol (COREG) 3.125 MG tablet Take 1 tablet (3.125 mg total) by mouth 2 (two) times daily.   Cyanocobalamin (VITAMIN B-12) 1000 MCG SUBL Place 2,000 mcg under the tongue. 1 BID   ferrous sulfate 325 (65 FE) MG tablet Take 325 mg by mouth daily with breakfast.   finasteride (PROSCAR) 5 MG tablet Take 1 tablet (5 mg total) by mouth daily.   furosemide (LASIX) 20 MG tablet TAKE 1 TABLET (20 MG TOTAL) BY MOUTH DAILY. DAILY, AS NEEDED FOR LOWER LEG SWELLING.   metFORMIN (GLUCOPHAGE-XR) 500 MG 24 hr tablet Take 1 tablet (500 mg total) by mouth 2 (two) times daily.   Multiple Vitamin (MULTIVITAMIN) tablet Take 1 tablet by mouth daily.   omeprazole (PRILOSEC) 20 MG capsule TAKE 1 CAPSULE BY MOUTH EVERY DAY   Potassium Citrate 15 MEQ (1620 MG) TBCR TAKE 1 TABLET BY MOUTH 3 TIMES A DAY   rosuvastatin (CRESTOR) 10 MG tablet Take 10 mg by mouth daily.   sacubitril-valsartan (ENTRESTO) 24-26 MG Take 1 tablet by mouth 2 (two) times daily.   spironolactone (ALDACTONE) 25 MG tablet TAKE 1 TABLET BY MOUTH EVERY DAY   timolol  (TIMOPTIC) 0.5 % ophthalmic solution Place 1 drop into both eyes 2 (two) times daily.    Vitamin D3 (VITAMIN D) 25 MCG tablet Take 1 tablet by mouth 2 (two) times daily.   No facility-administered encounter medications on file as of 11/19/2020.   Contacted James Braun to review adherence to medications.  Patient is > 5 days past due for refill on the following medications per chart history:       Drug Name  Dose    Statin (Cholesterol):  Rosuvastatin  10mg    Patient is aware I am calling to review the following medication: Rosuvastatin 10mg     How are you taking?  Rosuvastatin 10mg  - 1 daily in the morning       How many tablets per day? 1    What time of day? morning   What concerns do you have about this medication? The patient reports this medication makes him feel like an old person.   How often do you forget or accidentally miss a dose? none   Do you use a pillbox? Yes   Are you having any problems getting this medication from your pharmacy? No - The patient states he just got his first mail order last month and the medication arrived on time.   Has the cost of this medication been a concern? Yes  - The patient states the local [facility] is twice the price of the mail order pharmacy.  When was the last time you filled your prescription? 30-40 days ago (the patient reports he is on bottle #1 about 30-40 pills into his 90 day supply) Verified the patient does not have any expired medications. The patient does not have any old rosuvastatin around.The patient did not have his rosuvastatin bottle with him during the conversation.   Do you have excess medication on hand? No  Counseled patient on:   Importance of taking medication daily without missed doses  Using a pillbox to assist with medication organization   Access to CCM team for any cost, medication, or pharmacy concerns  STAR Medications Name   Last fill date   DS Entresto 24-26mg . 10/04/2020 90ds Rosuvastatin  10mg . 08/10/2020 30ds Metformin 500mg . 10/04/2020 90ds  The patient went to mail order for his rosuvastatin 10mg  1 tablet daily and just received the RX about 30-40 days ago.  [name], CPP notified  Avel Sensor, [place] Assistant [phone]  I have reviewed the care management and care coordination activities outlined in this encounter and I am certifying that I agree with the content of this note. No further action required.  [name], PharmD Clinical Pharmacist [facility] [phone]

## 2020-11-19 NOTE — Progress Notes (Signed)
The patient was informed of appointment with Sharyn Lull Adams,CPP on 02/21/2021 at 8:30am

## 2020-12-15 DIAGNOSIS — D509 Iron deficiency anemia, unspecified: Secondary | ICD-10-CM

## 2020-12-16 NOTE — Telephone Encounter (Signed)
Left message on home VM asking pt to call the office to schedule lab appt around the 15th and apologized for any inconveniences.

## 2020-12-16 NOTE — Telephone Encounter (Signed)
Please reschedule his blood work (the 15th or close). Tell him thanks for the reminder--that is what happens when I am drawing blood for another doctor, I don't always remember what it was for. (and sorry for any inconvenience)

## 2020-12-22 DIAGNOSIS — H401122 Primary open-angle glaucoma, left eye, moderate stage: Secondary | ICD-10-CM | POA: Diagnosis not present

## 2020-12-27 ENCOUNTER — Ambulatory Visit: Payer: PPO | Admitting: Podiatry

## 2020-12-29 ENCOUNTER — Other Ambulatory Visit (INDEPENDENT_AMBULATORY_CARE_PROVIDER_SITE_OTHER): Payer: PPO

## 2020-12-29 ENCOUNTER — Other Ambulatory Visit: Payer: PPO

## 2020-12-29 ENCOUNTER — Other Ambulatory Visit: Payer: Self-pay

## 2020-12-29 DIAGNOSIS — H401122 Primary open-angle glaucoma, left eye, moderate stage: Secondary | ICD-10-CM | POA: Diagnosis not present

## 2020-12-29 DIAGNOSIS — D509 Iron deficiency anemia, unspecified: Secondary | ICD-10-CM

## 2020-12-29 DIAGNOSIS — E1121 Type 2 diabetes mellitus with diabetic nephropathy: Secondary | ICD-10-CM

## 2020-12-29 DIAGNOSIS — D649 Anemia, unspecified: Secondary | ICD-10-CM

## 2020-12-29 LAB — CBC
HCT: 47.9 % (ref 39.0–52.0)
Hemoglobin: 15.9 g/dL (ref 13.0–17.0)
MCHC: 33.2 g/dL (ref 30.0–36.0)
MCV: 86.2 fl (ref 78.0–100.0)
Platelets: 268 10*3/uL (ref 150.0–400.0)
RBC: 5.56 Mil/uL (ref 4.22–5.81)
RDW: 18.6 % — ABNORMAL HIGH (ref 11.5–15.5)
WBC: 5.2 10*3/uL (ref 4.0–10.5)

## 2020-12-29 LAB — IRON: Iron: 73 ug/dL (ref 42–165)

## 2020-12-29 LAB — HEMOGLOBIN A1C: Hgb A1c MFr Bld: 8.6 % — ABNORMAL HIGH (ref 4.6–6.5)

## 2020-12-30 ENCOUNTER — Ambulatory Visit: Payer: PPO | Admitting: Podiatry

## 2020-12-30 ENCOUNTER — Encounter: Payer: Self-pay | Admitting: Podiatry

## 2020-12-30 DIAGNOSIS — Q828 Other specified congenital malformations of skin: Secondary | ICD-10-CM

## 2020-12-30 DIAGNOSIS — B351 Tinea unguium: Secondary | ICD-10-CM

## 2020-12-30 DIAGNOSIS — M79676 Pain in unspecified toe(s): Secondary | ICD-10-CM

## 2020-12-30 DIAGNOSIS — N1831 Chronic kidney disease, stage 3a: Secondary | ICD-10-CM

## 2020-12-30 DIAGNOSIS — E1142 Type 2 diabetes mellitus with diabetic polyneuropathy: Secondary | ICD-10-CM

## 2020-12-30 NOTE — Progress Notes (Signed)
This patient returns to my office for at risk foot care.  This patient requires this care by a professional since this patient will be at risk due to having  Diabetes and CKD.    This patient is unable to cut nails himself since the patient cannot reach his nails.These nails are painful walking and wearing shoes.  This patient presents for at risk foot care today.  General Appearance  Alert, conversant and in no acute stress.  Vascular  Dorsalis pedis and posterior tibial  pulses are palpable  bilaterally.  Capillary return is within normal limits  bilaterally. Temperature is within normal limits  bilaterally.  Neurologic  Senn-Weinstein monofilament wire test diminished  bilaterally. Muscle power within normal limits bilaterally.  Nails Thick disfigured discolored nails with subungual debris  from hallux to fifth toes bilaterally. No evidence of bacterial infection or drainage bilaterally.  Orthopedic  No limitations of motion  feet .  No crepitus or effusions noted.  No bony pathology or digital deformities noted.  Skin  normotropic skin with no porokeratosis noted bilaterally.  No signs of infections or ulcers noted.   Porokeratosis sub 5th  B/L and left heel.  Onychomycosis  Pain in right toes  Pain in left toes  Porokeratosis  B/L.  Consent was obtained for treatment procedures.   Mechanical debridement of nails 1-5  bilaterally performed with a nail nipper.  Filed with dremel without incident. No infection or ulcer.  Debride callus with # 15 blade.   Return office visit  12 weeks                Told patient to return for periodic foot care and evaluation due to potential at risk complications.   Gardiner Barefoot DPM

## 2021-01-04 ENCOUNTER — Encounter: Payer: Self-pay | Admitting: Internal Medicine

## 2021-01-04 ENCOUNTER — Ambulatory Visit (INDEPENDENT_AMBULATORY_CARE_PROVIDER_SITE_OTHER): Payer: PPO | Admitting: Internal Medicine

## 2021-01-04 ENCOUNTER — Telehealth: Payer: Self-pay | Admitting: Hematology

## 2021-01-04 ENCOUNTER — Other Ambulatory Visit: Payer: Self-pay

## 2021-01-04 DIAGNOSIS — I5022 Chronic systolic (congestive) heart failure: Secondary | ICD-10-CM | POA: Diagnosis not present

## 2021-01-04 DIAGNOSIS — D500 Iron deficiency anemia secondary to blood loss (chronic): Secondary | ICD-10-CM

## 2021-01-04 DIAGNOSIS — E1121 Type 2 diabetes mellitus with diabetic nephropathy: Secondary | ICD-10-CM

## 2021-01-04 LAB — HM DIABETES FOOT EXAM

## 2021-01-04 MED ORDER — POTASSIUM CITRATE ER 15 MEQ (1620 MG) PO TBCR: 1.0000 | EXTENDED_RELEASE_TABLET | Freq: Three times a day (TID) | ORAL | 3 refills | Status: DC

## 2021-01-04 MED ORDER — OMEPRAZOLE 20 MG PO CPDR: DELAYED_RELEASE_CAPSULE | ORAL | 3 refills | Status: DC

## 2021-01-04 NOTE — Assessment & Plan Note (Signed)
Let himself go with frustration with the anemia, etc He vows to get back under control On metformin still Would start jardiance if not better in 3 months Last GFR is improved

## 2021-01-04 NOTE — Addendum Note (Signed)
Addended by: Pilar Grammes on: 01/04/2021 12:17 PM   Modules accepted: Orders

## 2021-01-04 NOTE — Telephone Encounter (Signed)
Cancelled appts per 6/21 sch msg. Spoke to pt who said he would call back if he felt like he needed to r/s.

## 2021-01-04 NOTE — Assessment & Plan Note (Signed)
Compensated with carvedilol, entresto, spironolactone

## 2021-01-04 NOTE — Progress Notes (Signed)
Subjective:    Patient ID: James Braun, male    DOB: Jun 16, 1941, 80 y.o.   MRN: [id number]  HPI Here for follow up of diabetes and other chronic health conditions This visit occurred during the SARS-CoV-2 public health emergency.  Safety protocols were in place, including screening questions prior to the visit, additional usage of staff PPE, and extensive cleaning of exam room while observing appropriate contact time as indicated for disinfecting solutions.   Wonders about stopping visits with Dr Irene Limbo Iron and hemoglobin normal----doesn't want iron infusion  Wonders about the omeprazole Taking it daily at bedtime  "I have gotten sloppy with my diabetes care" Frustrated by medical issues and was eating comfort food Now back to his fasting---cutting back on break and potatoes again He is convinced he can get it back under control  Current Outpatient Medications on File Prior to Visit  Medication Sig Dispense Refill   alum hydroxide-mag trisilicate (GAVISCON) 29-92 MG CHEW chewable tablet Chew 2 tablets by mouth 3 (three) times daily as needed for indigestion or heartburn.     carvedilol (COREG) 3.125 MG tablet Take 1 tablet (3.125 mg total) by mouth 2 (two) times daily. 60 tablet 6   Cyanocobalamin (VITAMIN B-12) 1000 MCG SUBL Place 2,000 mcg under the tongue. 1 BID     ferrous sulfate 325 (65 FE) MG tablet Take 325 mg by mouth daily with breakfast.     finasteride (PROSCAR) 5 MG tablet Take 1 tablet (5 mg total) by mouth daily. 90 tablet 3   metFORMIN (GLUCOPHAGE-XR) 500 MG 24 hr tablet Take 1 tablet (500 mg total) by mouth 2 (two) times daily. 180 tablet 3   Multiple Vitamin (MULTIVITAMIN) tablet Take 1 tablet by mouth daily.     omeprazole (PRILOSEC) 20 MG capsule TAKE 1 CAPSULE BY MOUTH EVERY DAY 30 capsule 11   Potassium Citrate 15 MEQ (1620 MG) TBCR TAKE 1 TABLET BY MOUTH 3 TIMES A DAY 270 tablet 3   rosuvastatin (CRESTOR) 10 MG tablet Take 10 mg by mouth daily.      sacubitril-valsartan (ENTRESTO) 24-26 MG Take 1 tablet by mouth 2 (two) times daily. 180 tablet 3   spironolactone (ALDACTONE) 25 MG tablet TAKE 1 TABLET BY MOUTH EVERY DAY 90 tablet 3   timolol (TIMOPTIC) 0.5 % ophthalmic solution Place 1 drop into both eyes 2 (two) times daily.      Vitamin D3 (VITAMIN D) 25 MCG tablet Take 1 tablet by mouth 2 (two) times daily.     furosemide (LASIX) 20 MG tablet TAKE 1 TABLET (20 MG TOTAL) BY MOUTH DAILY. DAILY, AS NEEDED FOR LOWER LEG SWELLING. 30 tablet 0   No current facility-administered medications on file prior to visit.    Allergies  Allergen Reactions   Lipitor [Atorvastatin] Other (See Comments)    myalgias   Lisinopril    Morphine And Related Other (See Comments)     hypotension   Levofloxacin Rash   Septra [Sulfamethoxazole-Trimethoprim] Rash   Sulfamethoxazole-Trimethoprim Rash    Past Medical History:  Diagnosis Date   BPH (benign prostatic hypertrophy)    Cancer (HCC)    melanoma   Coronary artery disease    History of kidney stones    History of melanoma excision OCT 2010   Hyperlipidemia    Hypertension    Left ureteral calculus    Nephrolithiasis    bilateral   Type 2 diabetes mellitus ([place])     Past Surgical History:  Procedure Laterality Date  CARDIAC CATHETERIZATION     CHOLECYSTECTOMY  july 2000   CORONARY ARTERY BYPASS GRAFT N/A 11/28/2019   Procedure: CORONARY ARTERY BYPASS GRAFTING (CABG) x3, using left internal mammary artery and Saphenous vein harvested endoscopically;  Surgeon: [name], MD;  Location: [place];  Service: Open Heart Surgery;  Laterality: N/A;   CYSTOSCOPY WITH STENT PLACEMENT Left 11/14/2012   Procedure: CYSTOSCOPY WITH STENT PLACEMENT;  Surgeon: [name], MD;  Location: [facility];  Service: Urology;  Laterality: Left;   CYSTOSCOPY/RETROGRADE/URETEROSCOPY/STONE EXTRACTION WITH BASKET Left 11/14/2012   Procedure: CYSTOSCOPY/RETROGRADE/URETEROSCOPY/STONE EXTRACTION  WITH BASKET;  Surgeon: [name], MD;  Location: [facility];  Service: Urology;  Laterality: Left;   EXTRACORPOREAL SHOCK WAVE LITHOTRIPSY  JULY 2000   X2   HOLMIUM LASER APPLICATION Left 09/16/4399   Procedure: HOLMIUM LASER APPLICATION;  Surgeon: [name], MD;  Location: [facility];  Service: Urology;  Laterality: Left;   LEFT HEART CATH AND CORONARY ANGIOGRAPHY N/A 11/27/2019   Procedure: LEFT HEART CATH AND CORONARY ANGIOGRAPHY;  Surgeon: [name], MD;  Location: [place] [facility];  Service: Cardiovascular;  Laterality: N/A;   LEFT URETEROSCOPIC STONE EXTRACTION  02-22-2007   NEPHROLITHOTOMY Right 09-28-2010   percutaneous   ORIF CLAVICLE FRACTURE  2006   PROSTATE BIOPSY  1998   neg   SHOULDER ARTHROSCOPY  1/15   Dr [name]   TEE WITHOUT CARDIOVERSION N/A 11/28/2019   Procedure: TRANSESOPHAGEAL ECHOCARDIOGRAM (TEE);  Surgeon: [name], Collier Salina, MD;  Location: [place];  Service: Open Heart Surgery;  Laterality: N/A;   TONSILLECTOMY      Family History  Problem Relation Age of Onset   Hypertension Mother    Diabetes Father    Hypertension Father    Colon polyps Brother    Coronary artery disease Neg Hx    Cancer Neg Hx    Esophageal cancer Neg Hx    Stomach cancer Neg Hx    Rectal cancer Neg Hx     Social History   Socioeconomic History   Marital status: Married    Spouse name: Not on file   Number of children: 2   Years of education: Not on file   Highest education level: Not on file  Occupational History   Occupation: Research officer, 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   Occupation: Buys very distressed houses and rehabilitates   Occupation: Does mediation  Tobacco Use   Smoking status: Never   Smokeless tobacco: Never  Substance and Sexual Activity   Alcohol use: No   Drug use: No   Sexual activity: Not on file  Other Topics Concern   Not on file  Social History Narrative   Has living will   Wife is health care POA---alternate  is committee of physicians   Would accept resuscitation---wouldn't accept prolonged ventilation   No tube feeds if cognitively unaware   Right handed   Social Determinants of Health   Financial Resource Strain: Low Risk    Difficulty of Paying Living Expenses: Not hard at all  Food Insecurity: Not on file  Transportation Needs: Not on file  Physical Activity: Not on file  Stress: Not on file  Social Connections: Not on file  Intimate Partner Violence: Not on file   Review of Systems Has sensitivity and soreness over sternum--no true chest pain No SOB Hasn't been exercising--but getting back to it No edema Sleeps flat---no PND     Objective:   Physical Exam Constitutional:  Appearance: Normal appearance.  Cardiovascular:     Rate and Rhythm: Normal rate and regular rhythm.     Pulses: Normal pulses.     Heart sounds: No murmur heard.   No gallop.  Pulmonary:     Effort: Pulmonary effort is normal.     Breath sounds: Normal breath sounds. No wheezing or rales.  Musculoskeletal:     Cervical back: Neck supple.     Right lower leg: No edema.     Left lower leg: No edema.  Lymphadenopathy:     Cervical: No cervical adenopathy.  Skin:    Findings: No rash.     Comments: No foot lesions  Neurological:     Mental Status: He is alert.     Comments: Normal sensation in feet  Psychiatric:        Mood and Affect: Mood normal.        Behavior: Behavior normal.           Assessment & Plan:

## 2021-01-04 NOTE — Assessment & Plan Note (Signed)
Seems to be resolved Still on omeprazole No more iron for now

## 2021-01-25 ENCOUNTER — Encounter: Payer: Self-pay | Admitting: Cardiology

## 2021-01-25 ENCOUNTER — Ambulatory Visit: Payer: PPO | Admitting: Cardiology

## 2021-01-25 ENCOUNTER — Other Ambulatory Visit: Payer: Self-pay

## 2021-01-25 VITALS — BP 124/60 | HR 98 | Ht 65.5 in | Wt 163.0 lb

## 2021-01-25 DIAGNOSIS — I5022 Chronic systolic (congestive) heart failure: Secondary | ICD-10-CM

## 2021-01-25 DIAGNOSIS — I2510 Atherosclerotic heart disease of native coronary artery without angina pectoris: Secondary | ICD-10-CM

## 2021-01-25 DIAGNOSIS — Z951 Presence of aortocoronary bypass graft: Secondary | ICD-10-CM | POA: Diagnosis not present

## 2021-01-25 NOTE — Patient Instructions (Addendum)
Medication Instructions:  The current medical regimen is effective;  continue present plan and medications.  *If you need a refill on your cardiac medications before your next appointment, please call your pharmacy*  Follow-Up: At [facility], you and your health needs are our priority.  As part of our continuing mission to provide you with exceptional heart care, we have created designated Provider Care Teams.  These Care Teams include your primary Cardiologist (physician) and Advanced Practice Providers (APPs -  Physician Assistants and Nurse Practitioners) who all work together to provide you with the care you need, when you need it.  We recommend signing up for the patient portal called "MyChart".  Sign up information is provided on this After Visit Summary.  MyChart is used to connect with patients for Virtual Visits (Telemedicine).  Patients are able to view lab/test results, encounter notes, upcoming appointments, etc.  Non-urgent messages can be sent to your provider as well.   To learn more about what you can do with MyChart, go to [email or web address].    Your next appointment:   6 month(s)  The format for your next appointment:   In Person  Provider:   You will see one of the following Advanced Practice Providers on your designated Care Team:   Cecilie Kicks, NP or one of the other NP/PA.   Then, Dr Candee Furbish will plan to see you again in 1 year(s).   Thank you for choosing [facility]!!

## 2021-01-25 NOTE — Progress Notes (Signed)
Cardiology Office Note:    Date:  01/25/2021   ID:  James Braun, DOB July 04, 1941, MRN [id number]  PCP:  [name], MD   [facility] Providers Cardiologist:  Quay Burow, MD Advanced Heart Failure:  Glori Bickers, MD     Referring MD: Jolaine Artist, MD    History of Present Illness:    James Braun is a 80 y.o. male here as a new patient with coronary artery disease status post CABG 0/9381, systolic heart failure secondary to ischemic cardiomyopathy.  Preop echocardiogram showed EF of 35% and mildly reduced RV function.  Repeat echocardiogram in November 2021 showed EF of 45 to 50% with normal RV.  Excellent.  Has seen Dr. Haroldine Laws in the past.  He had a brief postoperative course of atrial fibrillation and post cardiotomy shock requiring amiodarone and milrinone.  His bypass graft x3 with LIMA to LAD SVG to diagonal and SVG to posterior lateral branch.  Dr. Darcey Nora.  Past Medical History:  Diagnosis Date   BPH (benign prostatic hypertrophy)    Cancer (HCC)    melanoma   Coronary artery disease    History of kidney stones    History of melanoma excision OCT 2010   Hyperlipidemia    Hypertension    Left ureteral calculus    Nephrolithiasis    bilateral   Type 2 diabetes mellitus ([place])     Past Surgical History:  Procedure Laterality Date   CARDIAC CATHETERIZATION     CHOLECYSTECTOMY  july 2000   CORONARY ARTERY BYPASS GRAFT N/A 11/28/2019   Procedure: CORONARY ARTERY BYPASS GRAFTING (CABG) x3, using left internal mammary artery and Saphenous vein harvested endoscopically;  Surgeon: [name], MD;  Location: [place];  Service: Open Heart Surgery;  Laterality: N/A;   CYSTOSCOPY WITH STENT PLACEMENT Left 11/14/2012   Procedure: CYSTOSCOPY WITH STENT PLACEMENT;  Surgeon: [name], MD;  Location: [facility];  Service: Urology;  Laterality: Left;   CYSTOSCOPY/RETROGRADE/URETEROSCOPY/STONE EXTRACTION WITH BASKET Left  11/14/2012   Procedure: CYSTOSCOPY/RETROGRADE/URETEROSCOPY/STONE EXTRACTION WITH BASKET;  Surgeon: [name], MD;  Location: [facility];  Service: Urology;  Laterality: Left;   EXTRACORPOREAL SHOCK WAVE LITHOTRIPSY  JULY 2000   X2   HOLMIUM LASER APPLICATION Left 02/16/9936   Procedure: HOLMIUM LASER APPLICATION;  Surgeon: [name], MD;  Location: [facility];  Service: Urology;  Laterality: Left;   LEFT HEART CATH AND CORONARY ANGIOGRAPHY N/A 11/27/2019   Procedure: LEFT HEART CATH AND CORONARY ANGIOGRAPHY;  Surgeon: [name], MD;  Location: [place] [facility];  Service: Cardiovascular;  Laterality: N/A;   LEFT URETEROSCOPIC STONE EXTRACTION  02-22-2007   NEPHROLITHOTOMY Right 09-28-2010   percutaneous   ORIF CLAVICLE FRACTURE  2006   PROSTATE BIOPSY  1998   neg   SHOULDER ARTHROSCOPY  1/15   Dr [name]   TEE WITHOUT CARDIOVERSION N/A 11/28/2019   Procedure: TRANSESOPHAGEAL ECHOCARDIOGRAM (TEE);  Surgeon: [name], Collier Salina, MD;  Location: [place];  Service: Open Heart Surgery;  Laterality: N/A;   TONSILLECTOMY      Current Medications: Current Meds  Medication Sig   alum hydroxide-mag trisilicate (GAVISCON) 16-96 MG CHEW chewable tablet Chew 2 tablets by mouth 3 (three) times daily as needed for indigestion or heartburn.   carvedilol (COREG) 3.125 MG tablet Take 1 tablet (3.125 mg total) by mouth 2 (two) times daily.   Cyanocobalamin (VITAMIN B-12) 1000 MCG SUBL Place 2,000 mcg under the tongue. 1 BID  ferrous sulfate 325 (65 FE) MG tablet Take 325 mg by mouth daily with breakfast.   finasteride (PROSCAR) 5 MG tablet Take 1 tablet (5 mg total) by mouth daily.   metFORMIN (GLUCOPHAGE-XR) 500 MG 24 hr tablet Take 1 tablet (500 mg total) by mouth 2 (two) times daily.   Multiple Vitamin (MULTIVITAMIN) tablet Take 1 tablet by mouth daily.   omeprazole (PRILOSEC) 20 MG capsule TAKE 1 CAPSULE BY MOUTH EVERY DAY   Potassium Citrate 15 MEQ (1620  MG) TBCR Take 1 tablet by mouth 3 (three) times daily.   rosuvastatin (CRESTOR) 10 MG tablet Take 10 mg by mouth daily.   sacubitril-valsartan (ENTRESTO) 24-26 MG Take 1 tablet by mouth 2 (two) times daily.   spironolactone (ALDACTONE) 25 MG tablet TAKE 1 TABLET BY MOUTH EVERY DAY   timolol (TIMOPTIC) 0.5 % ophthalmic solution Place 1 drop into both eyes 2 (two) times daily.    Vitamin D3 (VITAMIN D) 25 MCG tablet Take 1 tablet by mouth 2 (two) times daily.     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   Occupation: Buys very distressed houses and rehabilitates   Occupation: Does mediation  Tobacco Use   Smoking status: Never   Smokeless tobacco: Never  Substance and Sexual Activity   Alcohol use: No   Drug use: No   Sexual activity: Not on file  Other Topics Concern   Not on file  Social History Narrative   Has living will   Wife is health care POA---alternate is committee of physicians   Would accept resuscitation---wouldn't accept prolonged ventilation   No tube feeds if cognitively unaware   Right handed   Social Determinants of Health   Financial Resource Strain: Low Risk    Difficulty of Paying Living Expenses: Not hard at all  Food Insecurity: Not on file  Transportation Needs: Not on file  Physical Activity: Not on file  Stress: Not on file  Social Connections: Not on file     Family History: The patient's family history includes Colon polyps in his brother; Diabetes in his father; Hypertension in his father and mother. There is no history of Coronary artery disease, Cancer, Esophageal  cancer, Stomach cancer, or Rectal cancer.  ROS:   Please see the history of present illness.    All other systems reviewed and are negative.  EKGs/Labs/Other Studies Reviewed:    The following studies were reviewed today: ECHO 05/19/20:  1. Left ventricular ejection fraction, by estimation, is 45 to 50%. The  left ventricle has mildly decreased function. The left ventricle  demonstrates regional wall motion abnormalities (see scoring  diagram/findings for description). Left ventricular  diastolic parameters are consistent with Grade I diastolic dysfunction  (impaired relaxation). Elevated left atrial pressure.   2. Right ventricular systolic function is normal. The right ventricular  size is normal.   3. The mitral valve is normal in structure. Mild mitral valve  regurgitation. No evidence of mitral stenosis.   4. The aortic valve is normal in structure. Aortic valve regurgitation is  not visualized. No aortic stenosis is present.   5. The inferior vena cava is normal in size  with greater than 50%  respiratory variability, suggesting right atrial pressure of 3 mmHg.   Comparison(s): Prior images reviewed side by side. The left ventricular  function has improved. EF on prior 35%.    Recent Labs: 11/08/2020: ALT 21; BUN 23; Creatinine 1.16; Potassium 4.2; Sodium 140 12/29/2020: Hemoglobin 15.9; Platelets 268.0  Recent Lipid Panel    Component Value Date/Time   CHOL 133 04/23/2020 0904   TRIG 82.0 04/23/2020 0904   HDL 50.10 04/23/2020 0904   CHOLHDL 3 04/23/2020 0904   VLDL 16.4 04/23/2020 0904   LDLCALC 67 04/23/2020 0904   LDLDIRECT 141.5 02/03/2013 0907     Risk Assessment/Calculations:          Physical Exam:    VS:  BP 124/60 (BP Location: Left Arm, Patient Position: Sitting, Cuff Size: Normal)   Pulse 98   Ht 5' 5.5" (1.664 m)   Wt 163 lb (73.9 kg)   SpO2 94%   BMI 26.71 kg/m     Wt Readings from Last 3 Encounters:  01/25/21 163 lb (73.9 kg)  01/04/21  162 lb (73.5 kg)  11/08/20 165 lb 9.6 oz (75.1 kg)     GEN:  Well nourished, well developed in no acute distress HEENT: Normal NECK: No JVD; No carotid bruits LYMPHATICS: No lymphadenopathy CARDIAC: RRR, no murmurs, rubs, gallops RESPIRATORY:  Clear to auscultation without rales, wheezing or rhonchi  ABDOMEN: Soft, non-tender, non-distended MUSCULOSKELETAL:  No edema; No deformity  SKIN: Warm and dry NEUROLOGIC:  Alert and oriented x 3 PSYCHIATRIC:  Normal affect   ASSESSMENT:    1. CAD in native artery   2. Chronic systolic heart failure ([place])   3. S/P CABG x 3    PLAN:    In order of problems listed above:  Coronary artery disease - Status post CABG x3 in May 2021 with LIMA to LAD SVG to diagonal SVG to posterior lateral branch.  No anginal symptoms.  Currently on goal-directed medical therapy with aspirin 81 mg and Crestor 10 mg once a day.  It would be optimal to be on 20 mg a day but we are current continuing with 10.  Continue with current medical management. - On Entresto low-dose 24/26 - On spironolactone 25 mg a day - On carvedilol 3.125 mg twice a day.  I explained to him how this is beneficial in the setting of heart failure. -He has not needed Lasix for quite some time.  He takes it as needed for ankle swelling, 20 mg.  Continue with current medical management.  Hyperlipidemia - Last LDL 67.  Excellent ankle.  Diabetes with heart failure - Hemoglobin A1c 8.6 previously.  Dr. [name] has been treating.  Consider Jardiance or [place].  Dizziness - Previously seen by neurology.  Improved.  Postop atrial fibrillation - Resolved.  Did have some melena on Eliquis.  Colonoscopy 10/21 okay.  Did get 2 iron infusions.  20-month follow-up.  APP.  Me in 1 year       Medication Adjustments/Labs and Tests Ordered: Current medicines are reviewed at length with the patient today.  Concerns regarding medicines are outlined above.  No orders of the defined types were  placed in this encounter.  No orders of the defined types were placed in this encounter.   Patient Instructions  Medication Instructions:  The current medical regimen is effective;  continue present plan and medications.  *If you need a refill on your cardiac medications before your next appointment, please call your pharmacy*  Follow-Up: At [facility], you and your health needs are our priority.  As part of our continuing mission to provide you with exceptional heart care, we have created designated Provider Care Teams.  These Care Teams include your primary Cardiologist (physician) and Advanced Practice Providers (APPs -  Physician Assistants and Nurse Practitioners) who all work together to provide you with the care you need, when you need it.  We recommend signing up for the patient portal called "MyChart".  Sign up information is provided on this After Visit Summary.  MyChart is used to connect with patients for Virtual Visits (Telemedicine).  Patients are able to view lab/test results, encounter notes, upcoming appointments, etc.  Non-urgent messages can be sent to your provider as well.   To learn more about what you can do with MyChart, go to [email or web address].    Your next appointment:   6 month(s)  The format for your next appointment:   In Person  Provider:   You will see one of the following Advanced Practice Providers on your designated Care Team:   Cecilie Kicks, NP or one of the other NP/PA.   Then, Dr Candee Furbish will plan to see you again in 1 year(s).   Thank you for choosing [facility]!!     Signed, Candee Furbish, MD  01/25/2021 4:55 PM    [facility]

## 2021-02-02 ENCOUNTER — Other Ambulatory Visit: Payer: PPO

## 2021-02-02 ENCOUNTER — Ambulatory Visit: Payer: PPO | Admitting: Hematology

## 2021-02-15 ENCOUNTER — Telehealth: Payer: Self-pay

## 2021-02-15 NOTE — Chronic Care Management (AMB) (Addendum)
    Chronic Care Management Pharmacy Assistant   Name: James Braun  MRN: [id number] DOB: [birth year]    Reason for Encounter: CCM Reminder Call   Conditions to be addressed/monitored: HTN, HLD, and DMII   Medications: Outpatient Encounter Medications as of 02/15/2021  Medication Sig   alum hydroxide-mag trisilicate (GAVISCON) [id number] MG CHEW chewable tablet Chew 2 tablets by mouth 3 (three) times daily as needed for indigestion or heartburn.   carvedilol (COREG) 3.125 MG tablet Take 1 tablet (3.125 mg total) by mouth 2 (two) times daily.   Cyanocobalamin (VITAMIN B-12) 1000 MCG SUBL Place 2,000 mcg under the tongue. 1 BID   ferrous sulfate 325 (65 FE) MG tablet Take 325 mg by mouth daily with breakfast.   finasteride (PROSCAR) 5 MG tablet Take 1 tablet (5 mg total) by mouth daily.   metFORMIN (GLUCOPHAGE-XR) 500 MG 24 hr tablet Take 1 tablet (500 mg total) by mouth 2 (two) times daily.   Multiple Vitamin (MULTIVITAMIN) tablet Take 1 tablet by mouth daily.   omeprazole (PRILOSEC) 20 MG capsule TAKE 1 CAPSULE BY MOUTH EVERY DAY   Potassium Citrate 15 MEQ (1620 MG) TBCR Take 1 tablet by mouth 3 (three) times daily.   rosuvastatin (CRESTOR) 10 MG tablet Take 10 mg by mouth daily.   sacubitril-valsartan (ENTRESTO) 24-26 MG Take 1 tablet by mouth 2 (two) times daily.   spironolactone (ALDACTONE) 25 MG tablet TAKE 1 TABLET BY MOUTH EVERY DAY   timolol (TIMOPTIC) 0.5 % ophthalmic solution Place 1 drop into both eyes 2 (two) times daily.    Vitamin D3 (VITAMIN D) 25 MCG tablet Take 1 tablet by mouth 2 (two) times daily.   No facility-administered encounter medications on file as of 02/15/2021.   [name] did not answer the telephone to remind him of his upcoming telephone visit with [name] on 02/21/2021 at 8:30am. Patient was reminded to have all medications, supplements and any blood glucose and blood pressure readings available for review at appointment. Message left on  voicemail.   Star Rating Drugs: Medication:  Last Fill: Day Supply Metformin '500mg'$  12/09/20 90 Entresto24-'26mg'$  12/09/20 90 Rosuvastatin '10mg'$  12/31/20 [place], CPP notified  Avel Sensor, [place] Assistant [phone]  I have reviewed the care management and care coordination activities outlined in this encounter and I am certifying that I agree with the content of this note. No further action required.  [name], PharmD Clinical Pharmacist [facility] [phone]

## 2021-02-21 ENCOUNTER — Other Ambulatory Visit: Payer: Self-pay

## 2021-02-21 ENCOUNTER — Ambulatory Visit (INDEPENDENT_AMBULATORY_CARE_PROVIDER_SITE_OTHER): Payer: PPO

## 2021-02-21 DIAGNOSIS — E1121 Type 2 diabetes mellitus with diabetic nephropathy: Secondary | ICD-10-CM | POA: Diagnosis not present

## 2021-02-21 DIAGNOSIS — I10 Essential (primary) hypertension: Secondary | ICD-10-CM

## 2021-02-21 NOTE — Progress Notes (Signed)
Chronic Care Management Pharmacy Note  02/21/2021 Name:  James Braun MRN:  [id number] DOB:  1941-05-16  Subjective: James Braun is an 80 y.o. year old male who is a primary patient of Letvak, Theophilus Kinds, MD.  The CCM team was consulted for assistance with disease management and care coordination needs.    Engaged with patient by telephone for follow up visit in response to provider referral for pharmacy case management and/or care coordination services.   Consent to Services:  The patient was given information about Chronic Care Management services, agreed to services, and gave verbal consent prior to initiation of services.  Please see initial visit note for detailed documentation.   Patient Care Team: [name], MD as PCP - General [name] [name], MD as PCP - Cardiology (Cardiology) Bensimhon, Shaune Pascal, MD as PCP - Advanced Heart Failure (Cardiology) [name], [facility] as Pharmacist (Pharmacist)  Recent office visits: 01/04/21 - PCP - Pt presented for diabetes follow up. Poor diet, A1c increased to 8.6%. Continues metformin. If not improved in 3 months, start Jardiance. Anemia resolved.  Recent consult visits: 01/25/21 - Cardiology - Pt presented for CAD, CHF follow up. Last echo Nov 2021 EF 45-50% normal RV. Stable. Continue current medications.  Hospital visits: None in previous 6 months   Objective:  Lab Results  Component Value Date   CREATININE 1.16 11/08/2020   BUN 23 11/08/2020   GFR 54.91 (L) 09/07/2020   GFRNONAA >60 11/08/2020   GFRAA 49 (L) 01/06/2020   NA 140 11/08/2020   K 4.2 11/08/2020   CALCIUM 9.4 11/08/2020   CO2 24 11/08/2020   GLUCOSE 176 (H) 11/08/2020    Lab Results  Component Value Date/Time   HGBA1C 8.6 (H) 12/29/2020 08:21 AM   HGBA1C 7.0 (H) 09/07/2020 07:51 AM   GFR 54.91 (L) 09/07/2020 07:51 AM   GFR 42.28 (L) 04/23/2020 09:04 AM   MICROALBUR 12.0 (H) 02/15/2015 02:33 PM   MICROALBUR 8.8 (H) 12/25/2011 10:25 AM     Last diabetic Eye exam:  Lab Results  Component Value Date/Time   HMDIABEYEEXA No Retinopathy 03/26/2020 12:00 AM    Last diabetic Foot exam:  Lab Results  Component Value Date/Time   HMDIABFOOTEX done 01/04/2021 12:00 AM     Lab Results  Component Value Date   CHOL 133 04/23/2020   HDL 50.10 04/23/2020   LDLCALC 67 04/23/2020   LDLDIRECT 141.5 02/03/2013   TRIG 82.0 04/23/2020   CHOLHDL 3 04/23/2020    Hepatic Function Latest Ref Rng & Units 11/08/2020 09/07/2020 04/23/2020  Total Protein 6.5 - 8.1 g/dL 6.9 - 7.0  Albumin 3.5 - 5.0 g/dL 4.0 4.3 4.4  AST 15 - 41 U/L 17 - 22  ALT 0 - 44 U/L 21 - 16  Alk Phosphatase 38 - 126 U/L 55 - 41  Total Bilirubin 0.3 - 1.2 mg/dL 0.3 - 0.3  Bilirubin, Direct 0.0 - 0.3 mg/dL - - -    Lab Results  Component Value Date/Time   TSH 4.384 11/28/2019 05:11 AM   TSH 2.84 02/03/2013 09:07 AM   TSH 3.37 12/27/2010 08:53 AM   FREET4 0.65 03/17/2020 08:24 AM   FREET4 0.82 01/16/2020 10:51 AM    CBC Latest Ref Rng & Units 12/29/2020 11/08/2020 09/17/2020  WBC 4.0 - 10.5 K/uL 5.2 5.8 5.1  Hemoglobin 13.0 - 17.0 g/dL 15.9 13.6 8.7(L)  Hematocrit 39.0 - 52.0 % 47.9 43.0 27.1(L)  Platelets 150.0 - 400.0 K/uL 268.0 306 385.0  No results found for: VD25OH  Clinical ASCVD: Yes  The ASCVD Risk score Mikey Bussing DC Jr., et al., 2013) failed to calculate for the following reasons:   The 2013 ASCVD risk score is only valid for ages 20 to 29    Depression screen PHQ 2/9 04/16/2020 03/24/2020 02/17/2020  Decreased Interest 0 0 0  Down, Depressed, Hopeless 0 0 0  PHQ - 2 Score 0 0 0  Some recent data might be hidden    EF Nov 2021 - 45-50%  Social History   Tobacco Use  Smoking Status Never  Smokeless Tobacco Never   BP Readings from Last 3 Encounters:  01/25/21 124/60  01/04/21 120/78  11/08/20 128/75   Pulse Readings from Last 3 Encounters:  01/25/21 98  01/04/21 81  11/08/20 92   Wt Readings from Last 3 Encounters:  01/25/21 163 lb (73.9  kg)  01/04/21 162 lb (73.5 kg)  11/08/20 165 lb 9.6 oz (75.1 kg)   BMI Readings from Last 3 Encounters:  01/25/21 26.71 kg/m  01/04/21 26.55 kg/m  11/08/20 27.56 kg/m    Assessment/Interventions: Review of patient past medical history, allergies, medications, health status, including review of consultants reports, laboratory and other test data, was performed as part of comprehensive evaluation and provision of chronic care management services.   SDOH:  (Social Determinants of Health) assessments and interventions performed: Yes SDOH Interventions    Flowsheet Row Most Recent Value  SDOH Interventions   Financial Strain Interventions Intervention Not Indicated      SDOH Screenings   Alcohol Screen: Not on file  Depression (PHQ2-9): Low Risk    PHQ-2 Score: 0  Financial Resource Strain: Low Risk    Difficulty of Paying Living Expenses: Not hard at all  Food Insecurity: Not on file  Housing: Not on file  Physical Activity: Not on file  Social Connections: Not on file  Stress: Not on file  Tobacco Use: Low Risk    Smoking Tobacco Use: Never   Smokeless Tobacco Use: Never  Transportation Needs: Not on file    [place]  Allergies  Allergen Reactions   Lipitor [Atorvastatin] Other (See Comments)    myalgias   Lisinopril    Morphine And Related Other (See Comments)     hypotension   Levofloxacin Rash   Septra [Sulfamethoxazole-Trimethoprim] Rash   Sulfamethoxazole-Trimethoprim Rash    Medications Reviewed Today     Reviewed by Maren Beach, Adair Laundry, CMA (Certified Medical Assistant) on 01/25/21 at 1438  Med List Status: <None>   Medication Order Taking? Sig Documenting Provider Last Dose Status Informant  alum hydroxide-mag trisilicate (GAVISCON) 89-21 MG CHEW chewable tablet [id number] Yes Chew 2 tablets by mouth 3 (three) times daily as needed for indigestion or heartburn. [provider] Taking Active Self  carvedilol (COREG) 3.125 MG tablet  [id number] Yes Take 1 tablet (3.125 mg total) by mouth 2 (two) times daily. Bensimhon, Shaune Pascal, MD Taking Active   Cyanocobalamin (VITAMIN B-12) 1000 MCG SUBL [id number] Yes Place 2,000 mcg under the tongue. 1 BID [provider] Taking Active Self  ferrous sulfate 325 (65 FE) MG tablet [id number] Yes Take 325 mg by mouth daily with breakfast. [provider] Taking Active Self  finasteride (PROSCAR) 5 MG tablet [id number] Yes Take 1 tablet (5 mg total) by mouth daily. [name], MD Taking Active   metFORMIN (GLUCOPHAGE-XR) 500 MG 24 hr tablet [id number] Yes Take 1 tablet (500 mg total) by mouth 2 (two) times daily.  [name], MD Taking Active   Multiple Vitamin (MULTIVITAMIN) tablet [id number] Yes Take 1 tablet by mouth daily. [provider] Taking Active Self  omeprazole (PRILOSEC) 20 MG capsule [id number] Yes TAKE 1 CAPSULE BY MOUTH EVERY DAY [name], MD Taking Active   Potassium Citrate 15 MEQ (1620 MG) TBCR [id number] Yes Take 1 tablet by mouth 3 (three) times daily. [name], MD Taking Active   rosuvastatin (CRESTOR) 10 MG tablet [id number] Yes Take 10 mg by mouth daily. [provider] Taking Active   sacubitril-valsartan (ENTRESTO) 24-26 MG [id number] Yes Take 1 tablet by mouth 2 (two) times daily. Bensimhon, Shaune Pascal, MD Taking Active   spironolactone (ALDACTONE) 25 MG tablet [id number] Yes TAKE 1 TABLET BY MOUTH EVERY DAY [name], MD Taking Active   timolol (TIMOPTIC) 0.5 % ophthalmic solution [id number] Yes Place 1 drop into both eyes 2 (two) times daily.  [provider] Taking Active Self  Vitamin D3 (VITAMIN D) 25 MCG tablet [id number] Yes Take 1 tablet by mouth 2 (two) times daily. [provider] Taking Active Self            Patient Active Problem List   Diagnosis Date Noted   Iron deficiency anemia due to chronic blood loss 09/27/2020   Anemia 09/17/2020   Statin myopathy 02/10/2020    Heart failure, systolic, chronic ([place]) 22/08/5425   PAF (paroxysmal atrial fibrillation) ([place]) 12/03/2019   S/P CABG x 3 11/28/2019   CAD in native artery 11/27/2019   GERD (gastroesophageal reflux disease) 06/30/2019   Anemia in chronic kidney disease (CKD) 09/05/2018   Chronic renal disease, stage III ([place]) 03/06/2018   Counseling regarding advanced directives 02/13/2014   Onychomycosis 04/25/2013   Routine general medical examination at a health care facility 01/04/2011   BPH without obstruction/lower urinary tract symptoms 07/13/2010   Controlled type 2 diabetes mellitus with diabetic nephropathy ([place]) 01/11/2010   Hyperlipemia 01/11/2010   Essential hypertension, benign 01/11/2010   NEPHROLITHIASIS, HX OF 01/11/2010    Immunization History  Administered Date(s) Administered   Fluad Quad(high Dose 65+) 05/19/2020   Hepatitis A 06/17/2003, 02/15/2004   Hepatitis B 07/03/2012, 09/04/2012, 12/25/2012   Influenza Split 05/31/2011, 05/27/2012   Influenza Whole 05/17/2009   Influenza,inj,Quad PF,6+ Mos 05/28/2013, 05/26/2014, 06/04/2015, 05/29/2016, 05/17/2017, 05/30/2018, 05/29/2019   Influenza-Unspecified 05/18/2015   PFIZER(Purple Top)SARS-COV-2 Vaccination 07/26/2019, 08/16/2019, 05/01/2020   Pneumococcal Conjugate-13 02/13/2014   Pneumococcal Polysaccharide-23 02/14/2001, 02/20/2006, 03/01/2017   Td 11/05/1999, 01/11/2010   Tdap 12/22/2009, 03/25/2016   Zoster, Live 06/28/2005    Conditions to be addressed/monitored:  Hypertension and Diabetes  Care Plan : [place]  Updates made by [name], [place] since 02/21/2021 12:00 AM     Problem: CHL AMB "PATIENT-SPECIFIC PROBLEM"      Long-Range Goal: Disease Management   Start Date: 02/21/2021  Priority: High  Note:     Current Barriers:  None identified  Pharmacist Clinical Goal(s):  Patient will contact provider office for questions/concerns as evidenced notation of same in electronic health record  through collaboration with PharmD and provider.   Interventions: 1:1 collaboration with [name], MD regarding development and update of comprehensive plan of care as evidenced by provider attestation and co-signature Inter-disciplinary care team collaboration (see longitudinal plan of care) Comprehensive medication review performed; medication list updated in electronic medical record  Hypertension  (BP goal <140/90) -Controlled - per home and clinic readings -Complications: CHF  -Current treatment: Carvedilol 3.125 mg -  1 tablet twice daily with meals Spironolactone 25 mg - 1 tablet daily  -Medications previously tried: none reported  -Current home readings: reports always well within range, last home check - 128/76 mmHg, usually hovers around 120/70 -Denies hypotensive/hypertensive symptoms; He is felling a lot more stable on his feet since anemia resolved and now > 1 year since heart surgery. Mowed his yard recently. Walks daily. Denies falls. -Educated on BP goals and benefits of medications for prevention of heart attack, stroke and kidney damage; -Counseled to monitor BP at home with abnormal symptoms, document, and provide log at future appointments -Recommended to continue current medication  Diabetes (A1c goal <7%) -Not ideally controlled - A1c 8.6%n (12/2020) -A1c usually well controlled, patient reports he slipped on his diet but is working on cutting out carbs since last visit with Dr. [name]. Follow up in Sept. -Current medications: Metformin ER 500 mg - 1 tablet twice daily -Medications previously tried: none  -Current home glucose readings - checks on rare occasion  fasting glucose: 106 post prandial glucose: none -Denies hypoglycemic/hyperglycemic symptoms -Current meal patterns: he cut out desserts and limiting carbs - crackers, bread, potatoes -Current exercise: walks 15 minutes every morning -Educated on A1c and blood sugar goals; -Counseled to check feet  daily and get yearly eye exams - up to date -Recommended to continue current medication  Other -  Iron levels now WNL. He reports restored energy. Feeling much better on iron daily. He received 2 iron infusions this Spring. Vitamin B12 1000 mcg - 1 under tongue every day. He recently added this in addition to his other daily B12 supplement (dose unknown). Discussed no need to take 2 supplements given last B12 > 1500. He will cut back to 1000 mcg SL daily.  Patient Goals/Self-Care Activities Patient will:  - continue to take medications as prescribed - continue to limit carbs  - call with any health concerns  Follow Up Plan: Telephone follow up appointment with care management team member scheduled for:  12 months, if A1c remains elevated, we will follow up sooner.       Medication Assistance: None required.  Patient affirms current coverage meets needs. Declines interest in [place] assistance.  Compliance/Adherence/Medication fill history: Care Gaps: Shingrix COVID-19 Booster Flu vaccine  Star-Rating Drugs: Medication:                Last Fill:         Day Supply Metformin 557m        12/09/20            90 Entresto24-257m       12/09/20            90 Rosuvastatin 1048m   12/31/20            30  All refills timely   Patient's [facility] is:  [facility] [address]: [phone]: [phone]/pharmacy #[id number], [place] [address]: [phone]: [phone] pill box? Yes Pt endorses 100% compliance  Care Plan and Follow Up Patient Decision:  Patient agrees to Care Plan and Follow-up.  MichDebbora DusarmD Clinical Pharmacist [facility] at [facility]-[phone]

## 2021-02-21 NOTE — Patient Instructions (Signed)
Dear Elder Love,  Below is a summary of the goals we discussed during our follow up appointment on February 21, 2021. Please contact me anytime with questions or concerns.   Visit Information    Patient Care Plan: CCM Pharmacy Care Plan     Problem Identified: CHL AMB "PATIENT-SPECIFIC PROBLEM"      Long-Range Goal: Disease Management   Start Date: 02/21/2021  Priority: High  Note:     Current Barriers:  None identified  Pharmacist Clinical Goal(s):  Patient will contact provider office for questions/concerns as evidenced notation of same in electronic health record through collaboration with PharmD and provider.   Interventions: 1:1 collaboration with [name], MD regarding development and update of comprehensive plan of care as evidenced by provider attestation and co-signature Inter-disciplinary care team collaboration (see longitudinal plan of care) Comprehensive medication review performed; medication list updated in electronic medical record  Hypertension  (BP goal <140/90) -Controlled - per home and clinic readings -Complications: CHF  -Current treatment: Carvedilol 3.125 mg - 1 tablet twice daily with meals Spironolactone 25 mg - 1 tablet daily  -Medications previously tried: none reported  -Current home readings: reports always well within range, last home check - 128/76 mmHg, usually hovers around 120/70 -Denies hypotensive/hypertensive symptoms; He is felling a lot more stable on his feet since anemia resolved and now > 1 year since heart surgery. Mowed his yard recently. Walks daily. Denies falls. -Educated on BP goals and benefits of medications for prevention of heart attack, stroke and kidney damage; -Counseled to monitor BP at home with abnormal symptoms, document, and provide log at future appointments -Recommended to continue current medication  Diabetes (A1c goal <7%) -Not ideally controlled - A1c 8.6%n (12/2020) -A1c usually well controlled,  patient reports he slipped on his diet but is working on cutting out carbs since last visit with Dr. [name]. Follow up in Sept. -Current medications: Metformin ER 500 mg - 1 tablet twice daily -Medications previously tried: none  -Current home glucose readings - checks on rare occasion  fasting glucose: 106 post prandial glucose: none -Denies hypoglycemic/hyperglycemic symptoms -Current meal patterns: he cut out desserts and limiting carbs - crackers, bread, potatoes -Current exercise: walks 15 minutes every morning -Educated on A1c and blood sugar goals; -Counseled to check feet daily and get yearly eye exams - up to date -Recommended to continue current medication  Other -  Iron levels now WNL. He reports restored energy. Feeling much better on iron daily. He received 2 iron infusions this Spring. Vitamin B12 1000 mcg - 1 under tongue every day. He recently added this in addition to his other daily B12 supplement (dose unknown). Discussed no need to take 2 supplements given last B12 > 1500. He will cut back to 1000 mcg SL daily.  Patient Goals/Self-Care Activities Patient will:  - continue to take medications as prescribed - continue to limit carbs  - call with any health concerns  Follow Up Plan: Telephone follow up appointment with care management team member scheduled for:  12 months, if A1c remains elevated, we will follow up sooner.       Patient verbalizes understanding of instructions provided today and agrees to view in [place].   [name], PharmD Clinical Pharmacist [facility] at [facility] [phone]

## 2021-03-22 ENCOUNTER — Other Ambulatory Visit: Payer: Self-pay | Admitting: Internal Medicine

## 2021-03-22 DIAGNOSIS — D509 Iron deficiency anemia, unspecified: Secondary | ICD-10-CM

## 2021-04-06 ENCOUNTER — Other Ambulatory Visit: Payer: PPO

## 2021-04-06 ENCOUNTER — Other Ambulatory Visit: Payer: Self-pay

## 2021-04-06 ENCOUNTER — Other Ambulatory Visit (INDEPENDENT_AMBULATORY_CARE_PROVIDER_SITE_OTHER): Payer: PPO

## 2021-04-06 DIAGNOSIS — E1121 Type 2 diabetes mellitus with diabetic nephropathy: Secondary | ICD-10-CM | POA: Diagnosis not present

## 2021-04-06 DIAGNOSIS — D509 Iron deficiency anemia, unspecified: Secondary | ICD-10-CM

## 2021-04-06 DIAGNOSIS — E7800 Pure hypercholesterolemia, unspecified: Secondary | ICD-10-CM | POA: Diagnosis not present

## 2021-04-06 LAB — LIPID PANEL
Cholesterol: 110 mg/dL (ref 0–200)
HDL: 31 mg/dL — ABNORMAL LOW (ref >39.00)
LDL Cholesterol: 56 mg/dL (ref 0–99)
NonHDL: 79.41
Total CHOL/HDL Ratio: 4
Triglycerides: 119 mg/dL (ref 0.0–149.0)
VLDL: 23.8 mg/dL (ref 0.0–40.0)

## 2021-04-06 LAB — CBC
HCT: 46.1 % (ref 39.0–52.0)
Hemoglobin: 14.9 g/dL (ref 13.0–17.0)
MCHC: 32.4 g/dL (ref 30.0–36.0)
MCV: 88.7 fl (ref 78.0–100.0)
Platelets: 282 10*3/uL (ref 150.0–400.0)
RBC: 5.19 Mil/uL (ref 4.22–5.81)
RDW: 17.1 % — ABNORMAL HIGH (ref 11.5–15.5)
WBC: 4.8 10*3/uL (ref 4.0–10.5)

## 2021-04-06 LAB — IBC PANEL
Iron: 72 ug/dL (ref 42–165)
Saturation Ratios: 20.6 % (ref 20.0–50.0)
TIBC: 350 ug/dL (ref 250.0–450.0)
Transferrin: 250 mg/dL (ref 212.0–360.0)

## 2021-04-06 LAB — HEMOGLOBIN A1C: Hgb A1c MFr Bld: 8.3 % — ABNORMAL HIGH (ref 4.6–6.5)

## 2021-04-06 NOTE — Addendum Note (Signed)
Addended by: Leeanne Rio on: 04/06/2021 08:30 AM   Modules accepted: Orders

## 2021-04-06 NOTE — Addendum Note (Signed)
Addended by: Leeanne Rio on: 04/06/2021 08:33 AM   Modules accepted: Orders

## 2021-04-07 ENCOUNTER — Encounter: Payer: Self-pay | Admitting: Podiatry

## 2021-04-07 ENCOUNTER — Ambulatory Visit: Payer: PPO | Admitting: Podiatry

## 2021-04-07 DIAGNOSIS — N200 Calculus of kidney: Secondary | ICD-10-CM | POA: Insufficient documentation

## 2021-04-07 DIAGNOSIS — B351 Tinea unguium: Secondary | ICD-10-CM

## 2021-04-07 DIAGNOSIS — M79676 Pain in unspecified toe(s): Secondary | ICD-10-CM | POA: Diagnosis not present

## 2021-04-07 DIAGNOSIS — N1831 Chronic kidney disease, stage 3a: Secondary | ICD-10-CM | POA: Diagnosis not present

## 2021-04-07 DIAGNOSIS — E1142 Type 2 diabetes mellitus with diabetic polyneuropathy: Secondary | ICD-10-CM | POA: Diagnosis not present

## 2021-04-07 DIAGNOSIS — Q828 Other specified congenital malformations of skin: Secondary | ICD-10-CM

## 2021-04-07 NOTE — Progress Notes (Signed)
This patient returns to my office for at risk foot care.  This patient requires this care by a professional since this patient will be at risk due to having  Diabetes and CKD.    This patient is unable to cut nails himself since the patient cannot reach his nails.These nails are painful walking and wearing shoes.  This patient presents for at risk foot care today.  General Appearance  Alert, conversant and in no acute stress.  Vascular  Dorsalis pedis and posterior tibial  pulses are palpable  bilaterally.  Capillary return is within normal limits  bilaterally. Temperature is within normal limits  bilaterally.  Neurologic  Senn-Weinstein monofilament wire test diminished  bilaterally. Muscle power within normal limits bilaterally.  Nails Thick disfigured discolored nails with subungual debris  from hallux to fifth toes bilaterally. No evidence of bacterial infection or drainage bilaterally.  Orthopedic  No limitations of motion  feet .  No crepitus or effusions noted.  No bony pathology or digital deformities noted.  Skin  normotropic skin with no porokeratosis noted bilaterally.  No signs of infections or ulcers noted.   Porokeratosis sub 5th  B/L.  Onychomycosis  Pain in right toes  Pain in left toes  Porokeratosis  B/L.  Consent was obtained for treatment procedures.   Mechanical debridement of nails 1-5  bilaterally performed with a nail nipper.  Filed with dremel without incident. No infection or ulcer.  Debride callus with # 15 blade.   Return office visit  12 weeks                Told patient to return for periodic foot care and evaluation due to potential at risk complications.   Gardiner Barefoot DPM

## 2021-04-13 ENCOUNTER — Ambulatory Visit (INDEPENDENT_AMBULATORY_CARE_PROVIDER_SITE_OTHER): Payer: PPO | Admitting: Internal Medicine

## 2021-04-13 ENCOUNTER — Encounter: Payer: Self-pay | Admitting: Internal Medicine

## 2021-04-13 ENCOUNTER — Other Ambulatory Visit: Payer: Self-pay

## 2021-04-13 DIAGNOSIS — E1121 Type 2 diabetes mellitus with diabetic nephropathy: Secondary | ICD-10-CM

## 2021-04-13 DIAGNOSIS — D500 Iron deficiency anemia secondary to blood loss (chronic): Secondary | ICD-10-CM

## 2021-04-13 DIAGNOSIS — I5022 Chronic systolic (congestive) heart failure: Secondary | ICD-10-CM

## 2021-04-13 MED ORDER — EMPAGLIFLOZIN 10 MG PO TABS: 10.0000 mg | ORAL_TABLET | Freq: Every day | ORAL | 5 refills | Status: DC

## 2021-04-13 NOTE — Assessment & Plan Note (Signed)
Compensated Will stop the spironolactone due to gynecomastia Will add the jardiance to entresto Fluid status is neutral and he will continue to weigh daily

## 2021-04-13 NOTE — Progress Notes (Signed)
Subjective:    Patient ID: James Braun, male    DOB: 10-02-1940, 80 y.o.   MRN: [id number]  HPI Here for follow up of diabetes and CHF This visit occurred during the SARS-CoV-2 public health emergency.  Safety protocols were in place, including screening questions prior to the visit, additional usage of staff PPE, and extensive cleaning of exam room while observing appropriate contact time as indicated for disinfecting solutions.   Concerned about ED and swollen testicles Not really having sex now Notes breast enlargement and tenderness  No edema Weight is stable Only chest pain is sternal--from the surgery (positional, etc) No dizziness or syncope  Balance is better again  Sugars are still a bit high Checks once a month or so  Current Outpatient Medications on File Prior to Visit  Medication Sig Dispense Refill   alum hydroxide-mag trisilicate (GAVISCON) 27-06 MG CHEW chewable tablet Chew 2 tablets by mouth 3 (three) times daily as needed for indigestion or heartburn.     carvedilol (COREG) 3.125 MG tablet Take 1 tablet (3.125 mg total) by mouth 2 (two) times daily. 60 tablet 6   Cyanocobalamin (VITAMIN B-12) 1000 MCG SUBL Place 1,000 mcg under the tongue. 1000 mcg SL daily     ferrous sulfate 325 (65 FE) MG tablet Take 325 mg by mouth daily with breakfast.     finasteride (PROSCAR) 5 MG tablet Take 1 tablet (5 mg total) by mouth daily. 90 tablet 3   metFORMIN (GLUCOPHAGE-XR) 500 MG 24 hr tablet Take 1 tablet (500 mg total) by mouth 2 (two) times daily. 180 tablet 3   Multiple Vitamin (MULTIVITAMIN) tablet Take 1 tablet by mouth daily.     omeprazole (PRILOSEC) 20 MG capsule TAKE 1 CAPSULE BY MOUTH EVERY DAY 90 capsule 3   Potassium Citrate 15 MEQ (1620 MG) TBCR Take 1 tablet by mouth 3 (three) times daily. 270 tablet 3   rosuvastatin (CRESTOR) 10 MG tablet Take 10 mg by mouth daily.     sacubitril-valsartan (ENTRESTO) 24-26 MG Take 1 tablet by mouth 2 (two) times daily. 180  tablet 3   SIMBRINZA 1-0.2 % SUSP Place 1 drop into the left eye 2 (two) times daily.     spironolactone (ALDACTONE) 25 MG tablet TAKE 1 TABLET BY MOUTH EVERY DAY 90 tablet 3   timolol (TIMOPTIC) 0.5 % ophthalmic solution Place 1 drop into both eyes 2 (two) times daily.      Vitamin D3 (VITAMIN D) 25 MCG tablet Take 1 tablet by mouth 2 (two) times daily.     No current facility-administered medications on file prior to visit.    Allergies  Allergen Reactions   Lipitor [Atorvastatin] Other (See Comments)    myalgias   Lisinopril    Morphine And Related Other (See Comments)     hypotension   Levofloxacin Rash   Septra [Sulfamethoxazole-Trimethoprim] Rash   Sulfamethoxazole-Trimethoprim Rash    Past Medical History:  Diagnosis Date   BPH (benign prostatic hypertrophy)    Cancer (HCC)    melanoma   Coronary artery disease    History of kidney stones    History of melanoma excision OCT 2010   Hyperlipidemia    Hypertension    Left ureteral calculus    Nephrolithiasis    bilateral   Type 2 diabetes mellitus ([place])     Past Surgical History:  Procedure Laterality Date   CARDIAC CATHETERIZATION     CHOLECYSTECTOMY  july 2000   CORONARY ARTERY BYPASS GRAFT  N/A 11/28/2019   Procedure: CORONARY ARTERY BYPASS GRAFTING (CABG) x3, using left internal mammary artery and Saphenous vein harvested endoscopically;  Surgeon: [name], MD;  Location: [place];  Service: Open Heart Surgery;  Laterality: N/A;   CYSTOSCOPY WITH STENT PLACEMENT Left 11/14/2012   Procedure: CYSTOSCOPY WITH STENT PLACEMENT;  Surgeon: [name], MD;  Location: [facility];  Service: Urology;  Laterality: Left;   CYSTOSCOPY/RETROGRADE/URETEROSCOPY/STONE EXTRACTION WITH BASKET Left 11/14/2012   Procedure: CYSTOSCOPY/RETROGRADE/URETEROSCOPY/STONE EXTRACTION WITH BASKET;  Surgeon: [name], MD;  [facility];  Service: Urology;  Laterality: Left;   EXTRACORPOREAL  SHOCK WAVE LITHOTRIPSY  JULY 2000   X2   HOLMIUM LASER APPLICATION Left 10/16/6382   Procedure: HOLMIUM LASER APPLICATION;  Surgeon: [name], MD;  Location: [facility];  Service: Urology;  Laterality: Left;   LEFT HEART CATH AND CORONARY ANGIOGRAPHY N/A 11/27/2019   Procedure: LEFT HEART CATH AND CORONARY ANGIOGRAPHY;  Surgeon: [name], MD;  Location: [facility];  Service: Cardiovascular;  Laterality: N/A;   LEFT URETEROSCOPIC STONE EXTRACTION  02-22-2007   NEPHROLITHOTOMY Right 09-28-2010   percutaneous   ORIF CLAVICLE FRACTURE  2006   PROSTATE BIOPSY  1998   neg   SHOULDER ARTHROSCOPY  1/15   Dr [name]   TEE WITHOUT CARDIOVERSION N/A 11/28/2019   Procedure: TRANSESOPHAGEAL ECHOCARDIOGRAM (TEE);  Surgeon: [name], Collier Salina, MD;  Location: [place];  Service: Open Heart Surgery;  Laterality: N/A;   TONSILLECTOMY      Family History  Problem Relation Age of Onset   Hypertension Mother    Diabetes Father    Hypertension Father    Colon polyps Brother    Coronary artery disease Neg Hx    Cancer Neg Hx    Esophageal cancer Neg Hx    Stomach cancer Neg Hx    Rectal cancer Neg Hx     Social History   Socioeconomic History   Marital status: Married    Spouse name: Not on file   Number of children: 2   Years of education: Not on file   Highest education level: Not on file  Occupational History   Occupation: Research officer, 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   Occupation: Buys very distressed houses and rehabilitates   Occupation: Does mediation  Tobacco Use   Smoking status: Never   Smokeless tobacco: Never  Substance and Sexual Activity   Alcohol use: No   Drug use: No   Sexual activity: Not on file  Other Topics Concern   Not on file  Social History Narrative   Has living will   Wife is health care POA---alternate is committee of physicians   Would accept resuscitation---wouldn't accept prolonged ventilation   No tube feeds if cognitively unaware    Right handed   Social Determinants of Health   Financial Resource Strain: Low Risk    Difficulty of Paying Living Expenses: Not hard at all  Food Insecurity: Not on file  Transportation Needs: Not on file  Physical Activity: Not on file  Stress: Not on file  Social Connections: Not on file  Intimate Partner Violence: Not on file   Review of Systems Appetite is okay Weight is stable---does check daily Sleeps well     Objective:   Physical Exam Constitutional:      Appearance: Normal appearance.  Cardiovascular:     Rate and Rhythm: Normal rate and regular rhythm.     Pulses: Normal pulses.     Heart  sounds: No murmur heard.   No gallop.  Pulmonary:     Effort: Pulmonary effort is normal.     Breath sounds: Normal breath sounds. No wheezing or rales.  Genitourinary:    Comments: Mild gynecomastia and tenderness Musculoskeletal:     Cervical back: Neck supple.     Right lower leg: No edema.     Left lower leg: No edema.  Lymphadenopathy:     Cervical: No cervical adenopathy.  Neurological:     Mental Status: He is alert.           Assessment & Plan:

## 2021-04-13 NOTE — Assessment & Plan Note (Signed)
Studies have normalized Continues on 65mg  OTC iron for now

## 2021-04-13 NOTE — Assessment & Plan Note (Signed)
Lab Results  Component Value Date   HGBA1C 8.3 (H) 04/06/2021   Still suboptimal but probably acceptable for age Will add jardiance to metformin--for diabetes and CHF

## 2021-04-14 MED ORDER — FREESTYLE LIBRE 2 SENSOR MISC: 1.0000 | 12 refills | Status: DC

## 2021-04-18 ENCOUNTER — Telehealth (INDEPENDENT_AMBULATORY_CARE_PROVIDER_SITE_OTHER): Payer: PPO | Admitting: Family Medicine

## 2021-04-18 ENCOUNTER — Encounter: Payer: Self-pay | Admitting: Family Medicine

## 2021-04-18 ENCOUNTER — Other Ambulatory Visit: Payer: Self-pay

## 2021-04-18 ENCOUNTER — Telehealth: Payer: Self-pay

## 2021-04-18 VITALS — BP 116/76 | HR 83 | Temp 98.7°F | Ht 66.0 in | Wt 160.0 lb

## 2021-04-18 DIAGNOSIS — E1121 Type 2 diabetes mellitus with diabetic nephropathy: Secondary | ICD-10-CM | POA: Diagnosis not present

## 2021-04-18 DIAGNOSIS — I480 Paroxysmal atrial fibrillation: Secondary | ICD-10-CM | POA: Diagnosis not present

## 2021-04-18 DIAGNOSIS — N183 Chronic kidney disease, stage 3 unspecified: Secondary | ICD-10-CM

## 2021-04-18 DIAGNOSIS — U071 COVID-19: Secondary | ICD-10-CM | POA: Insufficient documentation

## 2021-04-18 DIAGNOSIS — I2510 Atherosclerotic heart disease of native coronary artery without angina pectoris: Secondary | ICD-10-CM

## 2021-04-18 DIAGNOSIS — I10 Essential (primary) hypertension: Secondary | ICD-10-CM

## 2021-04-18 DIAGNOSIS — I5022 Chronic systolic (congestive) heart failure: Secondary | ICD-10-CM | POA: Diagnosis not present

## 2021-04-18 MED ORDER — MOLNUPIRAVIR EUA 200MG CAPSULE: 4.0000 | ORAL_CAPSULE | Freq: Two times a day (BID) | ORAL | 0 refills | Status: AC

## 2021-04-18 NOTE — Progress Notes (Signed)
Patient ID: James Braun, male    DOB: 23-Jul-1940, 80 y.o.   MRN: [id number]  Virtual visit completed through [employer], a video enabled telemedicine application. Due to national recommendations of social distancing due to COVID-19, a virtual visit is felt to be most appropriate for this patient at this time. Reviewed limitations, risks, security and privacy concerns of performing a virtual visit and the availability of in person appointments. I also reviewed that there may be a patient responsible charge related to this service. The patient agreed to proceed.   Patient location: home Provider location:  at [facility], office Persons participating in this virtual visit: patient, provider   If any vitals were documented, they were collected by patient at home unless specified below.    BP 116/76   Pulse 83   Temp 98.7 F (37.1 C)   Ht 5\' 6"  (1.676 m)   Wt 160 lb (72.6 kg)   BMI 25.82 kg/m    CC: COVID+  Subjective:   HPI: James Braun is a 80 y.o. male presenting on 04/18/2021 for Cough (C/o dry cough, body aches, fever- max 100.4.  Sxs started 04/15/21.  Pos home COVID test on 04/16/21.  Has has 1 COVID booster on 05/01/20.)   First day of symptoms: 04/15/2021 Tested COVID positive: 04/16/2021  Current symptoms: dry cough, body aches, fever 100.4, ST No: loss of taste/smell, dyspnea, wheezing, abd pain, nausea, diarrhea  Treatments to date: tylenol  Risk factors include: age, diabetes, hypertension, CKD, CAD s/p CABG x3, CHF Wife and grandson also tested positive.   COVID vaccination status: Pfizer 07/2019 x2, booster 04/2020     Relevant past medical, surgical, family and social history reviewed and updated as indicated. Interim medical history since our last visit reviewed. Allergies and medications reviewed and updated. Outpatient Medications Prior to Visit  Medication Sig Dispense Refill   alum hydroxide-mag trisilicate (GAVISCON) 78-29 MG CHEW chewable  tablet Chew 2 tablets by mouth 3 (three) times daily as needed for indigestion or heartburn.     carvedilol (COREG) 3.125 MG tablet Take 1 tablet (3.125 mg total) by mouth 2 (two) times daily. 60 tablet 6   Continuous Blood Gluc Sensor (FREESTYLE LIBRE 2 SENSOR) MISC 1 each by Does not apply route every 14 (fourteen) days. 2 each 12   Cyanocobalamin (VITAMIN B-12) 1000 MCG SUBL Place 1,000 mcg under the tongue. 1000 mcg SL daily     empagliflozin (JARDIANCE) 10 MG TABS tablet Take 1 tablet (10 mg total) by mouth daily before breakfast. 30 tablet 5   ferrous sulfate 325 (65 FE) MG tablet Take 325 mg by mouth daily with breakfast.     finasteride (PROSCAR) 5 MG tablet Take 1 tablet (5 mg total) by mouth daily. 90 tablet 3   metFORMIN (GLUCOPHAGE-XR) 500 MG 24 hr tablet Take 1 tablet (500 mg total) by mouth 2 (two) times daily. 180 tablet 3   Multiple Vitamin (MULTIVITAMIN) tablet Take 1 tablet by mouth daily.     omeprazole (PRILOSEC) 20 MG capsule TAKE 1 CAPSULE BY MOUTH EVERY DAY 90 capsule 3   Potassium Citrate 15 MEQ (1620 MG) TBCR Take 1 tablet by mouth 3 (three) times daily. 270 tablet 3   rosuvastatin (CRESTOR) 10 MG tablet Take 10 mg by mouth daily.     sacubitril-valsartan (ENTRESTO) 24-26 MG Take 1 tablet by mouth 2 (two) times daily. 180 tablet 3   SIMBRINZA 1-0.2 % SUSP Place 1 drop into the left eye  2 (two) times daily.     timolol (TIMOPTIC) 0.5 % ophthalmic solution Place 1 drop into both eyes 2 (two) times daily.      Vitamin D3 (VITAMIN D) 25 MCG tablet Take 1 tablet by mouth 2 (two) times daily.     No facility-administered medications prior to visit.     Per HPI unless specifically indicated in ROS section below Review of Systems Objective:  BP 116/76   Pulse 83   Temp 98.7 F (37.1 C)   Ht 5\' 6"  (1.676 m)   Wt 160 lb (72.6 kg)   BMI 25.82 kg/m   Wt Readings from Last 3 Encounters:  04/18/21 160 lb (72.6 kg)  04/13/21 162 lb (73.5 kg)  01/25/21 163 lb (73.9 kg)        Physical exam: Gen: alert, NAD, not ill appearing Pulm: speaks in complete sentences without increased work of breathing Psych: normal mood, normal thought content      Lab Results  Component Value Date   HGBA1C 8.3 (H) 04/06/2021   Lab Results  Component Value Date   CREATININE 1.16 11/08/2020   BUN 23 11/08/2020   NA 140 11/08/2020   K 4.2 11/08/2020   CL 105 11/08/2020   CO2 24 11/08/2020   GFR = >60 (10/2020) Assessment & Plan:   Problem List Items Addressed This Visit     Controlled type 2 diabetes mellitus with diabetic nephropathy ([place])   Essential hypertension, benign   Chronic renal disease, stage III (HCC)   CAD in native artery   Heart failure, systolic, chronic (HCC)   PAF (paroxysmal atrial fibrillation) ([place])   COVID-19 virus infection - Primary    Reviewed currently approved EUA treatments.  Reviewed expected course of illness, anticipated course of recovery, as well as red flags to suggest COVID pneumonia and/or to seek urgent in-person care. Reviewed latest CDC isolation/quarantine guidelines.  Encouraged fluids and rest. Reviewed further supportive care measures at home including vit C 500mg  bid, vit D 2000 IU daily, zinc 100mg  daily, tylenol PRN, pepcid 20mg  BID PRN.   Recommend:  molnupiravir given no recent kidney function and drug interactions with paxlovid.  Paxlovid drug interactions:  Omeprazole, crestor      Relevant Medications   molnupiravir EUA (LAGEVRIO) 200 mg CAPS capsule     Meds ordered this encounter  Medications   molnupiravir EUA (LAGEVRIO) 200 mg CAPS capsule    Sig: Take 4 capsules (800 mg total) by mouth 2 (two) times daily for 5 days.    Dispense:  40 capsule    Refill:  0   No orders of the defined types were placed in this encounter.   I discussed the assessment and treatment plan with the patient. The patient was provided an opportunity to ask questions and all were answered. The patient agreed with the plan and  demonstrated an understanding of the instructions. The patient was advised to call back or seek an in-person evaluation if the symptoms worsen or if the condition fails to improve as anticipated.  Follow up plan: No follow-ups on file.  Ria Bush, MD

## 2021-04-18 NOTE — Telephone Encounter (Signed)
[place] Night - Client TELEPHONE ADVICE RECORD [facility] Patient Name: James Braun Gender: Male DOB: 07/29/1940 Age: 80 Y 18 M 12 D Return Phone Number: [phone] (Primary), [phone] (Secondary) Address[place] [address] Client [facility] Night - Client Client Site [facility] Physician Viviana Simpler- MD Contact Type Call Who Is Calling Patient / Member / Family / Caregiver Call Type Triage / Clinical Relationship To Patient Self Return Phone Number [phone] (Primary) Chief Complaint Fever (non-urgent symptom) (greater than THREE MONTHS old) Reason for Call Symptomatic / Request for [place] states he just tested positive for covid and would like to know what he should do. Caller states he has a dry cough and today he started feeling tired and a temp of 100.4. Caller states he would like to get some iromectin if possible. Translation No Nurse Assessment Nurse: Nehemiah Massed, RN, Tammy Date/Time (Eastern Time): 04/17/2021 2:35:46 AM Confirm and document reason for call. If symptomatic, describe symptoms. ---Caller states he just tested positive for covid and would like to know what he should do. Caller states he has a dry cough and today he started feeling tired and a temp of 100.4. Caller states he would like to get some ivormectin if possible. Nurse advised paxlovid is usually called in but not sure about the other medication. Does the patient have any new or worsening symptoms? ---Yes Will a triage be completed? ---Yes Related visit to physician within the last 2 weeks? ---Yes Does the PT have any chronic conditions? (i.e. diabetes, asthma, this includes High risk factors for pregnancy, etc.) ---Yes List chronic conditions. ---HTN and diabetes. Is this a behavioral health or substance abuse call?  ---No Guidelines Guideline Title Affirmed Question Affirmed Notes Nurse Date/Time ([place] Time) COVID-19 - Diagnosed or Suspected [1] HIGH RISK for severe COVID complications (e.g., Planck, RN, Tammy 04/17/2021 2:39:03 AM PLEASE NOTE: All timestamps contained within this report are represented as [place] Standard Time. CONFIDENTIALTY NOTICE: This fax transmission is intended only for the addressee. It contains information that is legally privileged, confidential or otherwise protected from use or disclosure. If you are not the intended recipient, you are strictly prohibited from reviewing, disclosing, copying using or disseminating any of this information or taking any action in reliance on or regarding this information. If you have received this fax in error, please notify us immediately by telephone so that we can arrange for its return to [place]. Phone: [phone], Toll-Free: [phone], Fax: [phone] Page: 2 of 3 Call Id: [id number] Guidelines Guideline Title Affirmed Question Affirmed Notes Nurse Date/Time Eilene Ghazi Time) weak immune system, age > 53 years, obesity with BMI 30 or higher, pregnant, chronic lung disease or other chronic medical condition) AND [2] COVID symptoms (e.g., cough, fever) (Exceptions: Already seen by PCP and no new or worsening symptoms.) Disp. Time Eilene Ghazi Time) Disposition Final User 04/17/2021 2:45:12 AM Call PCP within 24 Hours Yes Planck, RN, Tammy Caller Disagree/Comply Comply Caller Understands Yes PreDisposition Go to Urgent Care/Walk-In Clinic Care Advice Given Per Guideline CALL PCP WITHIN 24 HOURS: * You need to discuss this with your doctor (or NP/PA) within the next 24 hours. GENERAL CARE ADVICE FOR COVID-19 SYMPTOMS: * The symptoms are generally treated the same whether you have COVID-19, influenza or some other respiratory virus. * Cough: Use cough drops. * Feeling dehydrated: Drink extra liquids. If the air in your home  is dry, use a humidifier. *  Fever: For fever over 101 F (38.3 C), take acetaminophen every 4 to 6 hours (Adults 650 mg) OR ibuprofen every 6 to 8 hours (Adults 400 mg). Before taking any medicine, read all the instructions on the package. Do not take aspirin unless your doctor has prescribed it for you. * Muscle aches, headache, and other pains: Often this comes and goes with the fever. Take acetaminophen every 4 to 6 hours (Adults 650 mg) OR ibuprofen every 6 to 8 hours (Adults 400 mg). Before taking any medicine, read all the instructions on the package. * Sore throat: Try throat lozenges, hard candy or warm chicken broth. COUGH MEDICINES: * COUGH DROPS: Over-the-counter cough drops can help a lot, especially for mild coughs. They soothe an irritated throat and remove the tickle sensation in the back of the throat. Cough drops are easy to carry with you. * COUGH SYRUP WITH DEXTROMETHORPHAN: An over-the-counter cough syrup can help your cough. The most common cough suppressant in overthe- counter cough medicines is dextromethorphan. * HOME REMEDY - HARD CANDY: Hard candy works just as well as Careers adviser- counter cough drops. People who have diabetes should use sugar-free candy. * HOME REMEDY - HONEY: This old home remedy has been shown to help decrease coughing at night. The adult dosage is 2 teaspoons (10 ml) at bedtime. CALL BACK IF: * You become worse CARE ADVICE given per COVID-19 - DIAGNOSED OR SUSPECTED (Adult) guideline. Referrals REFERRED TO PCP OFFICE PLEASE NOTE: All timestamps contained within this report are represented as [place] Standard Time. CONFIDENTIALTY NOTICE: This fax transmission is intended only for the addressee. It contains information that is legally privileged, confidential or otherwise protected from use or disclosure. If you are not the intended recipient, you are strictly prohibited from reviewing, disclosing, copying using or disseminating any of this information or  taking any action in reliance on or regarding this information. If you have received this fax in error, please notify us immediately by telephone so that we can arrange for its return to [place]. Phone: [phone], Toll-Free: [phone], Fax: [phone] Page: 3 of 3 Call Id:

## 2021-04-18 NOTE — Assessment & Plan Note (Signed)
Reviewed currently approved EUA treatments.  Reviewed expected course of illness, anticipated course of recovery, as well as red flags to suggest COVID pneumonia and/or to seek urgent in-person care. Reviewed latest CDC isolation/quarantine guidelines.  Encouraged fluids and rest. Reviewed further supportive care measures at home including vit C 500mg  bid, vit D 2000 IU daily, zinc 100mg  daily, tylenol PRN, pepcid 20mg  BID PRN.   Recommend:  molnupiravir given no recent kidney function and drug interactions with paxlovid.  Paxlovid drug interactions:  Omeprazole, crestor

## 2021-04-18 NOTE — Telephone Encounter (Signed)
Called patient number busy.

## 2021-04-18 NOTE — Telephone Encounter (Signed)
I spoke with pt; pt tested positive with home test for covid on 04/16/21; pts symptoms began on 04/15/21 with dry cough, fever, chills, body aches, scratchy S/T that has been persistent, pt has not had any SOB,. CP, H/A or diarrhea or vomiting. Pt has been taking Tylenol extra strength 500 mg taking 2 tabs bid. Pt is not in any distress but is interested in antiviral covid med. Pt scheduled for video visit on 04/18/21 at 12 noon with Dr Darnell Level. Self quarantine, drink plenty of fluids, rest, and take Tylenol for fever. UC & ED precautions given and pt voiced understanding. Sending note to Dr Darnell Level, Dr [name] as PCP and Lattie Haw CMA.

## 2021-04-18 NOTE — Telephone Encounter (Signed)
Lvm asking pt to call back ASAP.  Need to get him ready for 12:00 MyChart visit with Dr. Darnell Level.

## 2021-04-18 NOTE — Telephone Encounter (Signed)
Spoke with pt getting him ready for virtual visit.

## 2021-04-19 ENCOUNTER — Telehealth: Payer: Self-pay

## 2021-04-19 NOTE — Chronic Care Management (AMB) (Addendum)
Chronic Care Management Pharmacy Assistant   Name: James Braun  MRN: [id number] DOB: 1940/09/21  Reason for Encounter: Diabetes  Disease State    Recent office visits: 04/18/21-PCP-Telemedicine-Patient presented with a cough,body aches and fever 100.4 for 3 days,positive home covid test. Started molnupiravir 200mg  take 4 capsules 2 times daily for 5 days. Encouraged fluids and rest.Reviewed further supportive care measures at home including vit C 500mg  bid, vit D 2000 IU daily, zinc 100mg  daily, tylenol PRN, pepcid 20mg  BID PRN. 04/13/21-PCP-Patient presented for follow up diabetes and CHF.Will stop the spironolactone due to gynecomastia Will add the jardiance to entresto.follow up 3 months  Recent consult visits:  04/07/21-Podiatry- Patient presented for toe nail trimming,no medication changes  Hospital visits:  None in previous 6 months  Medications: Outpatient Encounter Medications as of 04/19/2021  Medication Sig   alum hydroxide-mag trisilicate (GAVISCON) 78-67 MG CHEW chewable tablet Chew 2 tablets by mouth 3 (three) times daily as needed for indigestion or heartburn.   carvedilol (COREG) 3.125 MG tablet Take 1 tablet (3.125 mg total) by mouth 2 (two) times daily.   Continuous Blood Gluc Sensor (FREESTYLE LIBRE 2 SENSOR) MISC 1 each by Does not apply route every 14 (fourteen) days.   Cyanocobalamin (VITAMIN B-12) 1000 MCG SUBL Place 1,000 mcg under the tongue. 1000 mcg SL daily   empagliflozin (JARDIANCE) 10 MG TABS tablet Take 1 tablet (10 mg total) by mouth daily before breakfast.   ferrous sulfate 325 (65 FE) MG tablet Take 325 mg by mouth daily with breakfast.   finasteride (PROSCAR) 5 MG tablet Take 1 tablet (5 mg total) by mouth daily.   metFORMIN (GLUCOPHAGE-XR) 500 MG 24 hr tablet Take 1 tablet (500 mg total) by mouth 2 (two) times daily.   molnupiravir EUA (LAGEVRIO) 200 mg CAPS capsule Take 4 capsules (800 mg total) by mouth 2 (two) times daily for 5 days.   Multiple  Vitamin (MULTIVITAMIN) tablet Take 1 tablet by mouth daily.   omeprazole (PRILOSEC) 20 MG capsule TAKE 1 CAPSULE BY MOUTH EVERY DAY   Potassium Citrate 15 MEQ (1620 MG) TBCR Take 1 tablet by mouth 3 (three) times daily.   rosuvastatin (CRESTOR) 10 MG tablet Take 10 mg by mouth daily.   sacubitril-valsartan (ENTRESTO) 24-26 MG Take 1 tablet by mouth 2 (two) times daily.   SIMBRINZA 1-0.2 % SUSP Place 1 drop into the left eye 2 (two) times daily.   timolol (TIMOPTIC) 0.5 % ophthalmic solution Place 1 drop into both eyes 2 (two) times daily.    Vitamin D3 (VITAMIN D) 25 MCG tablet Take 1 tablet by mouth 2 (two) times daily.   No facility-administered encounter medications on file as of 04/19/2021.    Recent Relevant Labs: Lab Results  Component Value Date/Time   HGBA1C 8.3 (H) 04/06/2021 08:38 AM   HGBA1C 8.6 (H) 12/29/2020 08:21 AM   MICROALBUR 12.0 (H) 02/15/2015 02:33 PM   MICROALBUR 8.8 (H) 12/25/2011 10:25 AM    Kidney Function Lab Results  Component Value Date/Time   CREATININE 1.16 11/08/2020 01:52 PM   CREATININE 1.24 09/07/2020 07:51 AM   CREATININE 1.53 (H) 04/23/2020 09:04 AM   GFR 54.91 (L) 09/07/2020 07:51 AM   GFRNONAA >60 11/08/2020 01:52 PM   GFRAA 49 (L) 01/06/2020 10:01 AM     Contacted patient on 04/26/21 to discuss diabetes disease state.   Current antihyperglycemic regimen:  Metformin XR 500 take 1 tablet 2 times daily  Jardiance 10mg  take 1 tablet daily before breakfast   Patient verbally confirms he is taking the above medications as directed. Yes  What diet changes have been made to improve diabetes control?The patient reports he limits his carbs and sugars from his meals and eats more vegetables   What recent interventions/DTPs have been made to improve glycemic control:  04/13/21- PCP added Jardiance 10mg     Have there been any recent hospitalizations or ED visits since last visit with CPP? No  Patient denies hypoglycemic symptoms, including  Pale, Sweaty, Shaky, Hungry, Nervous/irritable, and Vision changes  Patient denies hyperglycemic symptoms, including blurry vision, excessive thirst, fatigue, polyuria, and weakness  How often are you checking your blood sugar? once daily  What are your blood sugars ranging?  The patient reports with his Elenor Legato II he has a range of 70-170.  04/26/21-143 (fasting)        has been 124 for 24 hours  During the week, how often does your blood glucose drop below 70? Never  Are you checking your feet daily/regularly? Yes  Adherence Review: Is the patient currently on a STATIN medication? No Is the patient currently on ACE/ARB medication? Yes Does the patient have >5 day gap between last estimated fill dates? No  Care Gaps: Annual wellness visit in last year? No 03/26/20 Most recent A1C reading:  8.3  04/06/21 Most Recent BP reading: 130/82  88-P  04/13/21  Last eye exam / retinopathy screening: 03/26/20 Last diabetic foot exam: 01/04/21  Counseled patient on importance of annual eye and foot exam.   Star Rating Drugs:  Medication:  Last Fill: Day Supply Jardiance 10mg  04/13/21 30 Metformin XR 500mg  03/12/21 90 Entresto 24-26mg  03/12/21 90   No appointments scheduled within the next 30 days.   [name], CPP notified  Avel Sensor, [place] Assistant [phone]  I have reviewed the care management and care coordination activities outlined in this encounter and I am certifying that I agree with the content of this note. No further action required.  [name], PharmD Clinical Pharmacist [facility] at [facility] [phone]

## 2021-05-03 DIAGNOSIS — Z1283 Encounter for screening for malignant neoplasm of skin: Secondary | ICD-10-CM | POA: Diagnosis not present

## 2021-05-03 DIAGNOSIS — L570 Actinic keratosis: Secondary | ICD-10-CM | POA: Diagnosis not present

## 2021-05-03 DIAGNOSIS — L820 Inflamed seborrheic keratosis: Secondary | ICD-10-CM | POA: Diagnosis not present

## 2021-05-03 DIAGNOSIS — Z08 Encounter for follow-up examination after completed treatment for malignant neoplasm: Secondary | ICD-10-CM | POA: Diagnosis not present

## 2021-05-03 DIAGNOSIS — Z85820 Personal history of malignant melanoma of skin: Secondary | ICD-10-CM | POA: Diagnosis not present

## 2021-05-03 DIAGNOSIS — X32XXXD Exposure to sunlight, subsequent encounter: Secondary | ICD-10-CM | POA: Diagnosis not present

## 2021-05-13 ENCOUNTER — Other Ambulatory Visit (HOSPITAL_COMMUNITY): Payer: Self-pay

## 2021-05-13 MED ORDER — ENTRESTO 24-26 MG PO TABS: 1.0000 | ORAL_TABLET | Freq: Two times a day (BID) | ORAL | 3 refills | Status: DC

## 2021-06-27 DIAGNOSIS — E119 Type 2 diabetes mellitus without complications: Secondary | ICD-10-CM | POA: Diagnosis not present

## 2021-06-28 LAB — HM DIABETES EYE EXAM

## 2021-07-04 ENCOUNTER — Telehealth: Payer: Self-pay | Admitting: Internal Medicine

## 2021-07-04 NOTE — Telephone Encounter (Signed)
Pt called wanting to know if he needed labs before his appt on 07/20/20  Also wanted to have his potassium checked

## 2021-07-04 NOTE — Telephone Encounter (Signed)
Left detailed message on VM per DPR that Dr [name] would prefer to do labs at the visit. We will do A1C in the office at the appointment so we have that reading.

## 2021-07-05 ENCOUNTER — Encounter: Payer: Self-pay | Admitting: Internal Medicine

## 2021-07-07 ENCOUNTER — Other Ambulatory Visit: Payer: Self-pay

## 2021-07-07 ENCOUNTER — Ambulatory Visit: Payer: PPO | Admitting: Podiatry

## 2021-07-07 ENCOUNTER — Encounter: Payer: Self-pay | Admitting: Podiatry

## 2021-07-07 DIAGNOSIS — B351 Tinea unguium: Secondary | ICD-10-CM

## 2021-07-07 DIAGNOSIS — E1142 Type 2 diabetes mellitus with diabetic polyneuropathy: Secondary | ICD-10-CM

## 2021-07-07 DIAGNOSIS — N1831 Chronic kidney disease, stage 3a: Secondary | ICD-10-CM | POA: Diagnosis not present

## 2021-07-07 DIAGNOSIS — M79676 Pain in unspecified toe(s): Secondary | ICD-10-CM

## 2021-07-07 DIAGNOSIS — Q828 Other specified congenital malformations of skin: Secondary | ICD-10-CM

## 2021-07-07 NOTE — Progress Notes (Signed)
This patient returns to my office for at risk foot care.  This patient requires this care by a professional since this patient will be at risk due to having  Diabetes and CKD.    This patient is unable to cut nails himself since the patient cannot reach his nails.These nails are painful walking and wearing shoes.  This patient presents for at risk foot care today.  General Appearance  Alert, conversant and in no acute stress.  Vascular  Dorsalis pedis and posterior tibial  pulses are palpable  bilaterally.  Capillary return is within normal limits  bilaterally. Temperature is within normal limits  bilaterally.  Neurologic  Senn-Weinstein monofilament wire test diminished  bilaterally. Muscle power within normal limits bilaterally.  Nails Thick disfigured discolored nails with subungual debris  from hallux to fifth toes bilaterally. No evidence of bacterial infection or drainage bilaterally.  Orthopedic  No limitations of motion  feet .  No crepitus or effusions noted.  No bony pathology or digital deformities noted.  Skin  normotropic skin with no porokeratosis noted bilaterally.  No signs of infections or ulcers noted.   Porokeratosis sub 5th  B/L.  Onychomycosis  Pain in right toes  Pain in left toes  Porokeratosis  B/L.  Consent was obtained for treatment procedures.   Mechanical debridement of nails 1-5  bilaterally performed with a nail nipper.  Filed with dremel without incident. No infection or ulcer.  Debride callus with # 15 blade.   Return office visit  12 weeks                Told patient to return for periodic foot care and evaluation due to potential at risk complications.   Gardiner Barefoot DPM

## 2021-07-17 HISTORY — PX: CORNEAL TRANSPLANT: SHX108

## 2021-07-20 ENCOUNTER — Ambulatory Visit (INDEPENDENT_AMBULATORY_CARE_PROVIDER_SITE_OTHER): Payer: PPO | Admitting: Internal Medicine

## 2021-07-20 ENCOUNTER — Other Ambulatory Visit: Payer: Self-pay

## 2021-07-20 ENCOUNTER — Encounter: Payer: Self-pay | Admitting: Internal Medicine

## 2021-07-20 VITALS — BP 122/70 | HR 77 | Temp 97.0°F | Ht 66.0 in | Wt 156.4 lb

## 2021-07-20 DIAGNOSIS — I5022 Chronic systolic (congestive) heart failure: Secondary | ICD-10-CM | POA: Diagnosis not present

## 2021-07-20 DIAGNOSIS — E1121 Type 2 diabetes mellitus with diabetic nephropathy: Secondary | ICD-10-CM

## 2021-07-20 DIAGNOSIS — N1831 Chronic kidney disease, stage 3a: Secondary | ICD-10-CM

## 2021-07-20 LAB — POCT GLYCOSYLATED HEMOGLOBIN (HGB A1C): Hemoglobin A1C: 6.8 % — AB (ref 4.0–5.6)

## 2021-07-20 NOTE — Assessment & Plan Note (Signed)
He asks about stopping entresto--to see if it is causing his residual dizziness. I advised against it--but he can check with cardiologist Also on carvedilol and now jardiance

## 2021-07-20 NOTE — Assessment & Plan Note (Signed)
Lab Results  Component Value Date   HGBA1C 6.8 (A) 07/20/2021   Control much better with the addition of jardiance to the metformin Mild CKD

## 2021-07-20 NOTE — Assessment & Plan Note (Signed)
Is on the valsartan Will recheck at next visit

## 2021-07-20 NOTE — Progress Notes (Signed)
Subjective:    Patient ID: James Braun, male    DOB: [birth year], 81 y.o.   MRN: [id number]  HPI Here for follow up of diabetes--control had worsened  Has not had any problems with jardiance Does have some urinary urgency but not overly bad  Using the CGM---in range most of the time [facility]) This has helped him adjust his eating (like foods that send his sugar up) Did have one brief low sugar ---alarm went off on the monitor (but he had no symptoms)  Still has dizziness Seen by neurologist--no clear diagnosis Is some better--but concerned it could be from the entresto Will get "thick headed" feeling Also will lose balance and lean into a wall----no falls though  No chest pain ---seems to be from the surgery No SOB No edema No PND  Last GFR 54  Current Outpatient Medications on File Prior to Visit  Medication Sig Dispense Refill   alum hydroxide-mag trisilicate (GAVISCON) 64-40 MG CHEW chewable tablet Chew 2 tablets by mouth 3 (three) times daily as needed for indigestion or heartburn.     carvedilol (COREG) 3.125 MG tablet Take 1 tablet (3.125 mg total) by mouth 2 (two) times daily. 60 tablet 6   Continuous Blood Gluc Sensor (FREESTYLE LIBRE 2 SENSOR) MISC 1 each by Does not apply route every 14 (fourteen) days. 2 each 12   Cyanocobalamin (VITAMIN B-12) 1000 MCG SUBL Place 1,000 mcg under the tongue. 1000 mcg SL daily     empagliflozin (JARDIANCE) 10 MG TABS tablet Take 1 tablet (10 mg total) by mouth daily before breakfast. 30 tablet 5   ferrous sulfate 325 (65 FE) MG tablet Take 325 mg by mouth daily with breakfast.     finasteride (PROSCAR) 5 MG tablet Take 1 tablet (5 mg total) by mouth daily. 90 tablet 3   metFORMIN (GLUCOPHAGE-XR) 500 MG 24 hr tablet Take 1 tablet (500 mg total) by mouth 2 (two) times daily. 180 tablet 3   Multiple Vitamin (MULTIVITAMIN) tablet Take 1 tablet by mouth daily.     omeprazole (PRILOSEC) 20 MG capsule TAKE 1 CAPSULE BY MOUTH EVERY  DAY 90 capsule 3   Potassium Citrate 15 MEQ (1620 MG) TBCR Take 1 tablet by mouth 3 (three) times daily. 270 tablet 3   rosuvastatin (CRESTOR) 10 MG tablet Take 10 mg by mouth daily.     sacubitril-valsartan (ENTRESTO) 24-26 MG Take 1 tablet by mouth 2 (two) times daily. 180 tablet 3   SIMBRINZA 1-0.2 % SUSP Place 1 drop into the left eye 2 (two) times daily.     timolol (TIMOPTIC) 0.5 % ophthalmic solution Place 1 drop into both eyes 2 (two) times daily.      Vitamin D3 (VITAMIN D) 25 MCG tablet Take 1 tablet by mouth 2 (two) times daily.     No current facility-administered medications on file prior to visit.    Allergies  Allergen Reactions   Lipitor [Atorvastatin] Other (See Comments)    myalgias   Lisinopril    Morphine And Related Other (See Comments)     hypotension   Levofloxacin Rash   Septra [Sulfamethoxazole-Trimethoprim] Rash   Sulfamethoxazole-Trimethoprim Rash    Past Medical History:  Diagnosis Date   BPH (benign prostatic hypertrophy)    Cancer (HCC)    melanoma   Coronary artery disease    History of kidney stones    History of melanoma excision OCT 2010   Hyperlipidemia    Hypertension    Left  ureteral calculus    Nephrolithiasis    bilateral   Type 2 diabetes mellitus [facility])     Past Surgical History:  Procedure Laterality Date   CARDIAC CATHETERIZATION     CHOLECYSTECTOMY  july 2000   CORONARY ARTERY BYPASS GRAFT N/A 11/28/2019   Procedure: CORONARY ARTERY BYPASS GRAFTING (CABG) x3, using left internal mammary artery and Saphenous vein harvested endoscopically;  Surgeon: [name], MD;  Location: [facility];  Service: Open Heart Surgery;  Laterality: N/A;   CYSTOSCOPY WITH STENT PLACEMENT Left 11/14/2012   Procedure: CYSTOSCOPY WITH STENT PLACEMENT;  Surgeon: [name], MD;  Location[facility];  Service: Urology;  Laterality: Left;   CYSTOSCOPY/RETROGRADE/URETEROSCOPY/STONE EXTRACTION WITH BASKET Left 11/14/2012   Procedure:  CYSTOSCOPY/RETROGRADE/URETEROSCOPY/STONE EXTRACTION WITH BASKET;  Surgeon: [name], MD;  Location[facility];  Service: Urology;  Laterality: Left;   EXTRACORPOREAL SHOCK WAVE LITHOTRIPSY  JULY 2000   X2   HOLMIUM LASER APPLICATION Left 07/20/4313   Procedure: HOLMIUM LASER APPLICATION;  Surgeon: [name], MD;  Location: [facility];  Service: Urology;  Laterality: Left;   LEFT HEART CATH AND CORONARY ANGIOGRAPHY N/A 11/27/2019   Procedure: LEFT HEART CATH AND CORONARY ANGIOGRAPHY;  Surgeon: [name], MD;  Location: [facility];  Service: Cardiovascular;  Laterality: N/A;   LEFT URETEROSCOPIC STONE EXTRACTION  02-22-2007   NEPHROLITHOTOMY Right 09-28-2010   percutaneous   ORIF CLAVICLE FRACTURE  2006   PROSTATE BIOPSY  1998   neg   SHOULDER ARTHROSCOPY  1/15   Dr [name]   TEE WITHOUT CARDIOVERSION N/A 11/28/2019   Procedure: TRANSESOPHAGEAL ECHOCARDIOGRAM (TEE);  Surgeon: [name], Collier Salina, MD;  Location: [facility];  Service: Open Heart Surgery;  Laterality: N/A;   TONSILLECTOMY      Family History  Problem Relation Age of Onset   Hypertension Mother    Diabetes Father    Hypertension Father    Colon polyps Brother    Coronary artery disease Neg Hx    Cancer Neg Hx    Esophageal cancer Neg Hx    Stomach cancer Neg Hx    Rectal cancer Neg Hx     Social History   Socioeconomic History   Marital status: Married    Spouse name: Not on file   Number of children: 2   Years of education: Not on file   Highest education level: Not on file  Occupational History   Occupation: Research officer, 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   Occupation: Buys very distressed houses and rehabilitates   Occupation: Does mediation  Tobacco Use   Smoking status: Never   Smokeless tobacco: Never  Substance and Sexual Activity   Alcohol use: No   Drug use: No   Sexual activity: Not on file  Other Topics Concern   Not on file  Social History Narrative    Has living will   Wife is health care POA---alternate is committee of physicians   Would accept resuscitation---wouldn't accept prolonged ventilation   No tube feeds if cognitively unaware   Right handed   Social Determinants of Health   Financial Resource Strain: Low Risk    Difficulty of Paying Living Expenses: Not hard at all  Food Insecurity: Not on file  Transportation Needs: Not on file  Physical Activity: Not on file  Stress: Not on file  Social Connections: Not on file  Intimate Partner Violence: Not on file   Review of Systems Appetite is good--trying to be careful Weight is  down 5# since last visit     Objective:   Physical Exam Constitutional:      Appearance: Normal appearance.  Cardiovascular:     Rate and Rhythm: Normal rate and regular rhythm.     Pulses: Normal pulses.     Heart sounds: No murmur heard.   No gallop.  Pulmonary:     Effort: Pulmonary effort is normal.     Breath sounds: Normal breath sounds. No wheezing or rales.  Musculoskeletal:     Cervical back: Neck supple.     Right lower leg: No edema.     Left lower leg: No edema.  Lymphadenopathy:     Cervical: No cervical adenopathy.  Neurological:     Mental Status: He is alert.  Psychiatric:        Mood and Affect: Mood normal.        Behavior: Behavior normal.           Assessment & Plan:

## 2021-07-22 DIAGNOSIS — H401122 Primary open-angle glaucoma, left eye, moderate stage: Secondary | ICD-10-CM | POA: Diagnosis not present

## 2021-08-01 ENCOUNTER — Telehealth: Payer: Self-pay

## 2021-08-01 MED ORDER — FINASTERIDE 5 MG PO TABS: 5.0000 mg | ORAL_TABLET | Freq: Every day | ORAL | 3 refills | Status: DC

## 2021-08-01 MED ORDER — EMPAGLIFLOZIN 10 MG PO TABS: 10.0000 mg | ORAL_TABLET | Freq: Every day | ORAL | 3 refills | Status: DC

## 2021-08-01 MED ORDER — METFORMIN HCL ER 500 MG PO TB24: 500.0000 mg | ORAL_TABLET | Freq: Two times a day (BID) | ORAL | 3 refills | Status: DC

## 2021-08-01 NOTE — Telephone Encounter (Signed)
Rx sent electronically.  

## 2021-08-01 NOTE — Telephone Encounter (Signed)
Rxs sent electronically.  

## 2021-08-01 NOTE — Telephone Encounter (Signed)
°  Encourage patient to contact the pharmacy for refills or they can request refills through [facility]:  Please schedule appointment if longer than 1 year  NEXT APPOINTMENT DATE:  MEDICATION:empagliflozin (JARDIANCE) 10 MG TABS tablet  Is the patient out of medication?   [place] ([place], [place]    Let patient know to contact pharmacy at the end of the day to make sure medication is ready.  Please notify patient to allow 48-72 hours to process  CLINICAL FILLS OUT ALL BELOW:   LAST REFILL:  QTY:  REFILL DATE:    OTHER COMMENTS:    Okay for refill?  Please advise

## 2021-08-01 NOTE — Addendum Note (Signed)
Addended by: Pilar Grammes on: 08/01/2021 11:37 AM   Modules accepted: Orders

## 2021-08-20 ENCOUNTER — Other Ambulatory Visit: Payer: Self-pay | Admitting: Cardiovascular Disease

## 2021-09-02 DIAGNOSIS — H401122 Primary open-angle glaucoma, left eye, moderate stage: Secondary | ICD-10-CM | POA: Diagnosis not present

## 2021-09-19 ENCOUNTER — Ambulatory Visit: Payer: PPO | Admitting: Cardiology

## 2021-09-19 ENCOUNTER — Encounter: Payer: Self-pay | Admitting: Cardiology

## 2021-09-19 ENCOUNTER — Other Ambulatory Visit: Payer: Self-pay

## 2021-09-19 DIAGNOSIS — I480 Paroxysmal atrial fibrillation: Secondary | ICD-10-CM

## 2021-09-19 DIAGNOSIS — E782 Mixed hyperlipidemia: Secondary | ICD-10-CM

## 2021-09-19 DIAGNOSIS — I5022 Chronic systolic (congestive) heart failure: Secondary | ICD-10-CM | POA: Diagnosis not present

## 2021-09-19 DIAGNOSIS — I2510 Atherosclerotic heart disease of native coronary artery without angina pectoris: Secondary | ICD-10-CM

## 2021-09-19 NOTE — Assessment & Plan Note (Signed)
Resolved, postop.  Did have melena on Eliquis.  Colonoscopy 04/2020 was okay.  He received 2 iron infusions.  If atrial fibrillation does return, we will need to revisit anticoagulation. ?

## 2021-09-19 NOTE — Patient Instructions (Addendum)
Medication Instructions:  ?Please discontinue your Entresto. ?Continue all other medications as listed. ? ?*If you need a refill on your cardiac medications before your next appointment, please call your pharmacy* ? ?Follow-Up: ?At [facility], you and your health needs are our priority.  As part of our continuing mission to provide you with exceptional heart care, we have created designated Provider Care Teams.  These Care Teams include your primary Cardiologist (physician) and Advanced Practice Providers (APPs -  Physician Assistants and Nurse Practitioners) who all work together to provide you with the care you need, when you need it. ? ?We recommend signing up for the patient portal called "MyChart".  Sign up information is provided on this After Visit Summary.  MyChart is used to connect with patients for Virtual Visits (Telemedicine).  Patients are able to view lab/test results, encounter notes, upcoming appointments, etc.  Non-urgent messages can be sent to your provider as well.   ?To learn more about what you can do with MyChart, go to [email or web address].   ? ?Your next appointment:   ?3 month(s) ? ?The format for your next appointment:   ?In Person ? ?Provider:   ?Dr Candee Furbish or NP/PA. ? ?Thank you for choosing [facility]!! ? ? ? ?

## 2021-09-19 NOTE — Progress Notes (Signed)
Cardiology Office Note:    Date:  09/19/2021   ID:  James Braun, DOB 05/24/1941, MRN [id number]  PCP:  [name], MD   [facility] Providers Cardiologist:  Candee Furbish, MD Advanced Heart Failure:  Glori Bickers, MD     Referring MD: [name], MD    History of Present Illness:    James Braun is a 81 y.o. male here for the follow-up of coronary artery disease status post CABG 12/4401 with systolic heart failure secondary to ischemic cardiomyopathy with preoperative echocardiogram showing EF of 35% and moderately/mildly reduced RV function.  Repeat echocardiogram in November 2021 showed EF of 45 to 50% with normal RV.  Excellent.  Dr. Haroldine Laws had seen him in the past.  Had brief postoperative A-fib and post cardiotomy shock requiring amiodarone and milrinone.  Bypass grafts are LIMA to LAD SVG to diagonal SVG to posterior lateral branch, Dr. Darcey Nora performed.  Has been light headed and dizzy for the past 2 years. Saw neuro. Has dull HA. Wonders if he can come off [place]. Would like to stop it for 3 months.   Past Medical History:  Diagnosis Date   BPH (benign prostatic hypertrophy)    Cancer (HCC)    melanoma   Coronary artery disease    History of kidney stones    History of melanoma excision OCT 2010   Hyperlipidemia    Hypertension    Left ureteral calculus    Nephrolithiasis    bilateral   Type 2 diabetes mellitus ([place])     Past Surgical History:  Procedure Laterality Date   CARDIAC CATHETERIZATION     CHOLECYSTECTOMY  july 2000   CORONARY ARTERY BYPASS GRAFT N/A 11/28/2019   Procedure: CORONARY ARTERY BYPASS GRAFTING (CABG) x3, using left internal mammary artery and Saphenous vein harvested endoscopically;  Surgeon: [name], MD;  Location: [facility];  Service: Open Heart Surgery;  Laterality: N/A;   CYSTOSCOPY WITH STENT PLACEMENT Left 11/14/2012   Procedure: CYSTOSCOPY WITH STENT PLACEMENT;  Surgeon: [name], MD;   Location: [facility];  Service: Urology;  Laterality: Left;   CYSTOSCOPY/RETROGRADE/URETEROSCOPY/STONE EXTRACTION WITH BASKET Left 11/14/2012   Procedure: CYSTOSCOPY/RETROGRADE/URETEROSCOPY/STONE EXTRACTION WITH BASKET;  Surgeon: [name], MD;  Location: [facility];  Service: Urology;  Laterality: Left;   EXTRACORPOREAL SHOCK WAVE LITHOTRIPSY  JULY 2000   X2   HOLMIUM LASER APPLICATION Left 10/21/4257   Procedure: HOLMIUM LASER APPLICATION;  Surgeon: [name], MD;  Location: [facility];  Service: Urology;  Laterality: Left;   LEFT HEART CATH AND CORONARY ANGIOGRAPHY N/A 11/27/2019   Procedure: LEFT HEART CATH AND CORONARY ANGIOGRAPHY;  Surgeon: [name], MD;  Location: [facility];  Service: Cardiovascular;  Laterality: N/A;   LEFT URETEROSCOPIC STONE EXTRACTION  02-22-2007   NEPHROLITHOTOMY Right 09-28-2010   percutaneous   ORIF CLAVICLE FRACTURE  2006   PROSTATE BIOPSY  1998   neg   SHOULDER ARTHROSCOPY  1/15   Dr [name]   TEE WITHOUT CARDIOVERSION N/A 11/28/2019   Procedure: TRANSESOPHAGEAL ECHOCARDIOGRAM (TEE);  Surgeon: [name], Collier Salina, MD;  Location: [facility];  Service: Open Heart Surgery;  Laterality: N/A;   TONSILLECTOMY      Current Medications: Current Meds  Medication Sig   alum hydroxide-mag trisilicate (GAVISCON) 56-38 MG CHEW chewable tablet Chew 2 tablets by mouth 3 (three) times daily as needed for indigestion or heartburn.   carvedilol (COREG) 3.125 MG tablet Take 1  tablet (3.125 mg total) by mouth 2 (two) times daily.   Continuous Blood Gluc Sensor (FREESTYLE LIBRE 2 SENSOR) MISC 1 each by Does not apply route every 14 (fourteen) days.   Cyanocobalamin (VITAMIN B-12) 1000 MCG SUBL Place 1,000 mcg under the tongue. 1000 mcg SL daily   empagliflozin (JARDIANCE) 10 MG TABS tablet Take 1 tablet (10 mg total) by mouth daily before breakfast.   ferrous sulfate 325 (65 FE) MG tablet Take 325 mg by mouth  daily with breakfast.   finasteride (PROSCAR) 5 MG tablet Take 1 tablet (5 mg total) by mouth daily.   metFORMIN (GLUCOPHAGE-XR) 500 MG 24 hr tablet Take 1 tablet (500 mg total) by mouth 2 (two) times daily.   Multiple Vitamin (MULTIVITAMIN) tablet Take 1 tablet by mouth daily.   omeprazole (PRILOSEC) 20 MG capsule TAKE 1 CAPSULE BY MOUTH EVERY DAY   Potassium Citrate 15 MEQ (1620 MG) TBCR Take 1 tablet by mouth 3 (three) times daily.   rosuvastatin (CRESTOR) 10 MG tablet TAKE 1 TABLET BY MOUTH EVERY DAY   SIMBRINZA 1-0.2 % SUSP Place 1 drop into the left eye 2 (two) times daily.   timolol (TIMOPTIC) 0.5 % ophthalmic solution Place 1 drop into both eyes 2 (two) times daily.    Vitamin D3 (VITAMIN D) 25 MCG tablet Take 1 tablet by mouth 2 (two) times daily.   [DISCONTINUED] sacubitril-valsartan (ENTRESTO) 24-26 MG Take 1 tablet by mouth 2 (two) times daily.     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   Occupation: Buys very distressed houses and rehabilitates   Occupation: Does mediation  Tobacco Use   Smoking status: Never   Smokeless tobacco: Never  Substance and Sexual Activity   Alcohol use: No   Drug use: No   Sexual activity: Not on file  Other Topics Concern   Not on file  Social History Narrative   Has living will   Wife is health care POA---alternate is committee of physicians   Would accept resuscitation---wouldn't accept prolonged ventilation   No tube feeds if cognitively unaware   Right handed   Social Determinants of Health   Financial Resource Strain: Low Risk    Difficulty of Paying  Living Expenses: Not hard at all  Food Insecurity: Not on file  Transportation Needs: Not on file  Physical Activity: Not on file  Stress: Not on file  Social Connections: Not on file     Family History: The patient's family history includes Colon polyps in his brother; Diabetes in his father; Hypertension in his father and mother. There is no history of Coronary artery disease, Cancer, Esophageal cancer, Stomach cancer, or Rectal cancer.  ROS:   Please see the history of present illness.     All other systems reviewed and are negative.  EKGs/Labs/Other Studies Reviewed:    The following studies were reviewed today:  Echocardiogram 05/19/2020:  1. Left ventricular ejection fraction, by estimation, is 45 to 50%. The  left ventricle has mildly decreased function. The left ventricle  demonstrates regional wall motion abnormalities (see scoring  diagram/findings for description). Left ventricular  diastolic parameters are consistent with Grade I  diastolic dysfunction  (impaired relaxation). Elevated left atrial pressure.   2. Right ventricular systolic function is normal. The right ventricular  size is normal.   3. The mitral valve is normal in structure. Mild mitral valve  regurgitation. No evidence of mitral stenosis.   4. The aortic valve is normal in structure. Aortic valve regurgitation is  not visualized. No aortic stenosis is present.   5. The inferior vena cava is normal in size with greater than 50%  respiratory variability, suggesting right atrial pressure of 3 mmHg.   Comparison(s): Prior images reviewed side by side. The left ventricular  function has improved. EF on prior 35%.   EKG:  EKG is ordered today.  The ekg ordered today demonstrates sinus rhythm 81 bpm with PVCs  Recent Labs: 11/08/2020: ALT 21; BUN 23; Creatinine 1.16; Potassium 4.2; Sodium 140 04/06/2021: Hemoglobin 14.9; Platelets 282.0  Recent Lipid Panel    Component Value Date/Time   CHOL 110  04/06/2021 0838   TRIG 119.0 04/06/2021 0838   HDL 31.00 (L) 04/06/2021 0838   CHOLHDL 4 04/06/2021 0838   VLDL 23.8 04/06/2021 0838   LDLCALC 56 04/06/2021 0838   LDLDIRECT 141.5 02/03/2013 0907     Risk Assessment/Calculations:              Physical Exam:    VS:  BP 128/68 (BP Location: Left Arm, Patient Position: Sitting, Cuff Size: Normal)    Pulse 81    Ht '5\' 7"'$  (1.702 m)    Wt 156 lb 3.2 oz (70.9 kg)    BMI 24.46 kg/m     Wt Readings from Last 3 Encounters:  09/19/21 156 lb 3.2 oz (70.9 kg)  07/20/21 156 lb 6 oz (70.9 kg)  04/18/21 160 lb (72.6 kg)     GEN:  Well nourished, well developed in no acute distress HEENT: Normal NECK: No JVD; No carotid bruits LYMPHATICS: No lymphadenopathy CARDIAC: RRR, no murmurs, no rubs, gallops RESPIRATORY:  Clear to auscultation without rales, wheezing or rhonchi  ABDOMEN: Soft, non-tender, non-distended MUSCULOSKELETAL:  No edema; No deformity  SKIN: Warm and dry NEUROLOGIC:  Alert and oriented x 3 PSYCHIATRIC:  Normal affect   ASSESSMENT:    1. CAD in native artery   2. PAF (paroxysmal atrial fibrillation) (HCC)   3. Heart failure, systolic, chronic ([place])   4. Mixed hyperlipidemia    PLAN:    In order of problems listed above:  CAD in native artery - Status post CABG x3 in May 2021 with LIMA to LAD SVG to diagonal SVG to posterior lateral branch.  No anginal symptoms.  Currently on goal-directed medical therapy with aspirin 81 mg and Crestor 10 mg once a day.  It would be optimal to be on 20 mg a day but we are current continuing with 10.  Continue with current medical management. - On Entresto low-dose 24/26-given the ongoing dizziness for the past 2 years, he would like to come off of this medication.  We will stop it. -On Jardiance. - On carvedilol 3.125 mg twice a day.  I explained to him how this is beneficial in the setting of heart failure. -He has not needed Lasix for quite some time.  He takes it as needed for  ankle swelling, 20 mg.  Continue with current medical management.  PAF (paroxysmal atrial fibrillation) (HCC) Resolved, postop.  Did have melena on Eliquis.  Colonoscopy 04/2020 was okay.  He received 2 iron infusions.  If atrial fibrillation does return, we  will need to revisit anticoagulation.  Heart failure, systolic, chronic ([place]) With his dizziness, he would like to come off the [place].  Since his LV function did improve, we will go ahead and comply.  We will see him back in about 3 months to see how he is doing.  If his dizziness is still present, I would not be opposed for him to continue/restart the [facility]. -Continue with Jardiance, low-dose carvedilol.  Hemoglobin A1c 6.8, creatinine 1.1 hemoglobin 14.9  Hyperlipemia LDL 56.  Crestor 10.  Excellent. No Myalgias.  We will have him come back in in approximately 3 months with me or APP.  If dizziness did not resolve, I would not be opposed to resuming his low-dose Entresto.  Medication Adjustments/Labs and Tests Ordered: Current medicines are reviewed at length with the patient today.  Concerns regarding medicines are outlined above.  Orders Placed This Encounter  Procedures   EKG 12-Lead   No orders of the defined types were placed in this encounter.   Patient Instructions  Medication Instructions:  Please discontinue your Entresto. Continue all other medications as listed.  *If you need a refill on your cardiac medications before your next appointment, please call your pharmacy*  Follow-Up: At [facility], you and your health needs are our priority.  As part of our continuing mission to provide you with exceptional heart care, we have created designated Provider Care Teams.  These Care Teams include your primary Cardiologist (physician) and Advanced Practice Providers (APPs -  Physician Assistants and Nurse Practitioners) who all work together to provide you with the care you need, when you need it.  We recommend  signing up for the patient portal called "MyChart".  Sign up information is provided on this After Visit Summary.  MyChart is used to connect with patients for Virtual Visits (Telemedicine).  Patients are able to view lab/test results, encounter notes, upcoming appointments, etc.  Non-urgent messages can be sent to your provider as well.   To learn more about what you can do with MyChart, go to [email or web address].    Your next appointment:   3 month(s)  The format for your next appointment:   In Person  Provider:   Dr Candee Furbish or NP/PA.  Thank you for choosing [facility]!!      Signed, Candee Furbish, MD  09/19/2021 10:27 AM    [facility]

## 2021-09-19 NOTE — Assessment & Plan Note (Addendum)
LDL 56.  Crestor 10.  Excellent. No Myalgias. ?

## 2021-09-19 NOTE — Assessment & Plan Note (Addendum)
-   Status post CABG x3 in May 2021 with LIMA to LAD SVG to diagonal SVG to posterior lateral branch.  No anginal symptoms.  Currently on goal-directed medical therapy with aspirin 81 mg and Crestor 10 mg once a day.  It would be optimal to be on 20 mg a day but we are current continuing with 10.  Continue with current medical management. ?- On Entresto low-dose 24/26-given the ongoing dizziness for the past 2 years, he would like to come off of this medication.  We will stop it. ?-On Jardiance. ?- On carvedilol 3.125 mg twice a day.  I explained to him how this is beneficial in the setting of heart failure. ?-He has not needed Lasix for quite some time.  He takes it as needed for ankle swelling, 20 mg.  Continue with current medical management. ?

## 2021-09-19 NOTE — Assessment & Plan Note (Addendum)
With his dizziness, he would like to come off the [place].  Since his LV function did improve, we will go ahead and comply.  We will see him back in about 3 months to see how he is doing.  If his dizziness is still present, I would not be opposed for him to continue/restart the [facility]. ?-Continue with Jardiance, low-dose carvedilol.  Hemoglobin A1c 6.8, creatinine 1.1 hemoglobin 14.9 ?

## 2021-09-23 DIAGNOSIS — H401122 Primary open-angle glaucoma, left eye, moderate stage: Secondary | ICD-10-CM | POA: Diagnosis not present

## 2021-10-13 ENCOUNTER — Encounter: Payer: Self-pay | Admitting: Podiatry

## 2021-10-13 ENCOUNTER — Ambulatory Visit: Payer: PPO | Admitting: Podiatry

## 2021-10-13 DIAGNOSIS — N1831 Chronic kidney disease, stage 3a: Secondary | ICD-10-CM

## 2021-10-13 DIAGNOSIS — M2041 Other hammer toe(s) (acquired), right foot: Secondary | ICD-10-CM

## 2021-10-13 DIAGNOSIS — B351 Tinea unguium: Secondary | ICD-10-CM | POA: Diagnosis not present

## 2021-10-13 DIAGNOSIS — M79676 Pain in unspecified toe(s): Secondary | ICD-10-CM

## 2021-10-13 DIAGNOSIS — E1142 Type 2 diabetes mellitus with diabetic polyneuropathy: Secondary | ICD-10-CM

## 2021-10-13 DIAGNOSIS — M2042 Other hammer toe(s) (acquired), left foot: Secondary | ICD-10-CM | POA: Diagnosis not present

## 2021-10-13 NOTE — Progress Notes (Signed)
This patient returns to my office for at risk foot care.  This patient requires this care by a professional since this patient will be at risk due to having  Diabetes and CKD.    This patient is unable to cut nails himself since the patient cannot reach his nails.These nails are painful walking and wearing shoes.  This patient presents for at risk foot care today.  General Appearance  Alert, conversant and in no acute stress.  Vascular  Dorsalis pedis and posterior tibial  pulses are palpable  bilaterally.  Capillary return is within normal limits  bilaterally. Temperature is within normal limits  bilaterally.  Neurologic  Senn-Weinstein monofilament wire test diminished  bilaterally. Muscle power within normal limits bilaterally.  Nails Thick disfigured discolored nails with subungual debris  from hallux to fifth toes bilaterally. No evidence of bacterial infection or drainage bilaterally.  Orthopedic  No limitations of motion  feet .  No crepitus or effusions noted.  No bony pathology or digital deformities noted.  Skin  normotropic skin with no porokeratosis noted bilaterally.  No signs of infections or ulcers noted.     Onychomycosis  Pain in right toes  Pain in left toes  Porokeratosis  B/L.  Consent was obtained for treatment procedures.   Mechanical debridement of nails 1-5  bilaterally performed with a nail nipper.  Filed with dremel without incident. No infection or ulcer.     Return office visit  12 weeks                Told patient to return for periodic foot care and evaluation due to potential at risk complications.   Emmamarie Kluender DPM  

## 2021-11-23 ENCOUNTER — Telehealth: Payer: Self-pay

## 2021-11-23 NOTE — Progress Notes (Signed)
Chronic Care Management Pharmacy Assistant   Name: James Braun  MRN: [id number] DOB: [birth year]  Reason for Encounter: CCM (Diabetes Disease State)   Recent office visits:  07/20/21 Viviana Simpler, MD: Diabetes A1c 6.8  Recent consult visits:  10/13/21 Gardiner Barefoot, DPM (Podiatry): Nail problem Procedure: debridement of nails 1-5 09/19/21 Candee Furbish, MD (Cardiology): CAD in native artery. Stop (provider) Sacubitril-Valsartan 24-26 mg Stop (Patient): Entresto. F/U 3 months 09/02/21 Birder Robson (Ophthalmology):  Glaucoma 06/27/21 Birder Robson (Ophthalmology):  Glaucoma 07/07/21 Gardiner Barefoot, DPM (Podiatry): Callouses Procedure: debridement of nails 1-5 06/27/21 Birder Robson (Ophthalmology): Diabetes eye exam  Hospital visits:  None in previous 6 months  Medications: Outpatient Encounter Medications as of 11/23/2021  Medication Sig   alum hydroxide-mag trisilicate (GAVISCON) 71-69 MG CHEW chewable tablet Chew 2 tablets by mouth 3 (three) times daily as needed for indigestion or heartburn.   carvedilol (COREG) 3.125 MG tablet Take 1 tablet (3.125 mg total) by mouth 2 (two) times daily.   Continuous Blood Gluc Sensor (FREESTYLE LIBRE 2 SENSOR) MISC 1 each by Does not apply route every 14 (fourteen) days.   Cyanocobalamin (VITAMIN B-12) 1000 MCG SUBL Place 1,000 mcg under the tongue. 1000 mcg SL daily   empagliflozin (JARDIANCE) 10 MG TABS tablet Take 1 tablet (10 mg total) by mouth daily before breakfast.   ferrous sulfate 325 (65 FE) MG tablet Take 325 mg by mouth daily with breakfast.   finasteride (PROSCAR) 5 MG tablet Take 1 tablet (5 mg total) by mouth daily.   metFORMIN (GLUCOPHAGE-XR) 500 MG 24 hr tablet Take 1 tablet (500 mg total) by mouth 2 (two) times daily.   Multiple Vitamin (MULTIVITAMIN) tablet Take 1 tablet by mouth daily.   omeprazole (PRILOSEC) 20 MG capsule TAKE 1 CAPSULE BY MOUTH EVERY DAY   Potassium Citrate 15 MEQ (1620 MG) TBCR Take 1 tablet  by mouth 3 (three) times daily.   rosuvastatin (CRESTOR) 10 MG tablet TAKE 1 TABLET BY MOUTH EVERY DAY   SIMBRINZA 1-0.2 % SUSP Place 1 drop into the left eye 2 (two) times daily.   timolol (TIMOPTIC) 0.5 % ophthalmic solution Place 1 drop into both eyes 2 (two) times daily.    Vitamin D3 (VITAMIN D) 25 MCG tablet Take 1 tablet by mouth 2 (two) times daily.   No facility-administered encounter medications on file as of 11/23/2021.   Recent Relevant Labs: Lab Results  Component Value Date/Time   HGBA1C 6.8 (A) 07/20/2021 10:14 AM   HGBA1C 8.3 (H) 04/06/2021 08:38 AM   HGBA1C 8.6 (H) 12/29/2020 08:21 AM   MICROALBUR 12.0 (H) 02/15/2015 02:33 PM   MICROALBUR 8.8 (H) 12/25/2011 10:25 AM    Kidney Function Lab Results  Component Value Date/Time   CREATININE 1.16 11/08/2020 01:52 PM   CREATININE 1.24 09/07/2020 07:51 AM   CREATININE 1.53 (H) 04/23/2020 09:04 AM   GFR 54.91 (L) 09/07/2020 07:51 AM   GFRNONAA >60 11/08/2020 01:52 PM   GFRAA 49 (L) 01/06/2020 10:01 AM   Contacted patient on 11/24/2021 to discuss diabetes disease state.   Current antihyperglycemic regimen:  Metformin 500 mg - 1 tablet two times daily Jardiance 10 mg - 1 tablet daily before breakfast   Patient verbally confirms he is taking the above medications as directed. Yes  What diet changes have been made to improve diabetes control? Patient is trying really hard to watch what he eats so his A1c will be lower.   What recent interventions/DTPs have been made to  improve glycemic control:  Continue current medication  Have there been any recent hospitalizations or ED visits since last visit with CPP? No  Patient denies hypoglycemic symptoms, including Pale, Sweaty, Shaky, Hungry, Nervous/irritable, and Vision changes  Patient denies hyperglycemic symptoms, including blurry vision, excessive thirst, fatigue, polyuria, and weakness  How often are you checking your blood sugar? Patient has not been taking his blood  glucose at home. I asked patient to take his readings and keep a log. Patient agreed and asked that I call him back on 12/08/2021 for his log.   During the week, how often does your blood glucose drop below 70? Never  Are you checking your feet daily/regularly? Yes  Adherence Review: Is the patient currently on a STATIN medication? Yes Is the patient currently on ACE/ARB medication? No Does the patient have >5 day gap between last estimated fill dates? No  Care Gaps: Annual wellness visit in last year? No Most recent A1C reading: 6.8 on 07/20/21 Most Recent BP reading: 128/68 on 09/19/21  Last eye exam / retinopathy screening: Up to date Last diabetic foot exam: Up to date  Star Rating Drugs:  Medication:  Last Fill: Day Supply Rosuvastatin 10 mg 08/22/21 90 Jardiance 10 mg 08/22/21 90  Metformin 500 mg 10/27/21 90  Cardiology appointment on 12/19/2021 Podiatry appointment on 01/12/2022 PCP appointment for physical on 01/24/2022 CCM appointment on 03/02/2022  Charlene Brooke, CPP notified  Marijean Niemann, [place] Assistant [phone]

## 2021-12-02 ENCOUNTER — Other Ambulatory Visit: Payer: Self-pay | Admitting: Internal Medicine

## 2021-12-05 NOTE — Telephone Encounter (Signed)
James Braun contacting us to follow-up on medication refill below.  He is out of medication, but states no rush, just wanted to make sure we were aware and that [facility]

## 2021-12-08 ENCOUNTER — Telehealth: Payer: Self-pay | Admitting: Internal Medicine

## 2021-12-08 NOTE — Telephone Encounter (Signed)
FYI to Dr [name]. Average blood sugar last week was 117 and this week was 112. Pt has an OV in July. He is very happy with the results. Pt did not request a call back.

## 2021-12-08 NOTE — Telephone Encounter (Signed)
That is good to hear No changes needed then

## 2021-12-08 NOTE — Telephone Encounter (Signed)
Pt called and wanted to give the nurse and Dr. [name] an update, "he spoke with Amy on 12/05/2021 about a medication refill. He stated the 1st week was 117 mg and this week was 112 mg, pt said he is on target and wanted to let [place] and the nurse know." Please return the call back when possible.  Callback Number: [phone]

## 2021-12-18 DIAGNOSIS — I5020 Unspecified systolic (congestive) heart failure: Secondary | ICD-10-CM | POA: Insufficient documentation

## 2021-12-18 NOTE — Progress Notes (Unsigned)
Cardiology Office Note:    Date:  12/19/2021   ID:  AZTLAN COLL, DOB 1940-10-03, MRN [id number]  PCP:  [name], MD  [facility] Providers Cardiologist:  Candee Furbish, MD Advanced Heart Failure:  Glori Bickers, MD    Referring MD: [name], MD   Chief Complaint:  Follow-up for CHF    Patient Profile: Coronary artery disease S/p CABG in May 2021 (LIMA-LAD, SVG-DX, SVG-PL) Postoperative atrial fibrillation HFmrEF (heart failure with mildly reduced ejection fraction)  Ischemic cardiomyopathy Echo May 2021: EF 35 Echo November 2021: EF 45-50 Entresto DC'd in March 2023 secondary to dizziness Hypertension Hyperlipidemia Diabetes mellitus Chronic dizziness Prior neuro eval Hx melanoma BPH  Prior CV Studies: Carotid US 06/29/2020 Bilateral ICA 1-39  Echocardiogram 05/19/2020 EF 45-50, GR 1 DD, normal RVSF, mild MR inferior HK  Limited echo 12/03/2019 EF 35  History of Present Illness:   James Braun is a 81 y.o. male with the above problem list.  He was last seen by Dr. Marlou Porch in March 2023.  He had had chronic dizziness for quite some time and decision was made to stop Entresto.  He returns for follow-up with plans to restart Entresto if dizziness did not resolve.  He is here alone today.  Since stopping Entresto, he has had some marginal improvement in the way he feels.  He still has a lot of Entresto at home and is willing to retry it.  We had a long discussion today regarding whether or not to switch Entresto to an ARB.  He has tolerated telmisartan in the past.  He has not had chest discomfort or significant shortness of breath.  He has not had orthopnea, pedal edema or syncope.  He mainly feels unsteady.  He also describes a "brain fog."        Past Medical History:  Diagnosis Date   BPH (benign prostatic hypertrophy)    Cancer (HCC)    melanoma   Coronary artery disease    History of kidney stones    History of melanoma excision OCT 2010    Hyperlipidemia    Hypertension    Left ureteral calculus    Nephrolithiasis    bilateral   Type 2 diabetes mellitus (HCC)    Current Medications: Current Meds  Medication Sig   alum hydroxide-mag trisilicate (GAVISCON) 95-09 MG CHEW chewable tablet Chew 2 tablets by mouth 3 (three) times daily as needed for indigestion or heartburn.   aspirin EC 81 MG tablet Take 1 tablet (81 mg total) by mouth daily. Swallow whole.   carvedilol (COREG) 3.125 MG tablet Take 1 tablet (3.125 mg total) by mouth 2 (two) times daily.   Continuous Blood Gluc Sensor (FREESTYLE LIBRE 2 SENSOR) MISC 1 each by Does not apply route every 14 (fourteen) days.   Cyanocobalamin (VITAMIN B-12) 1000 MCG SUBL Place 1,000 mcg under the tongue. 1000 mcg SL daily   empagliflozin (JARDIANCE) 10 MG TABS tablet Take 1 tablet (10 mg total) by mouth daily before breakfast.   ferrous sulfate 325 (65 FE) MG tablet Take 325 mg by mouth daily with breakfast.   finasteride (PROSCAR) 5 MG tablet Take 1 tablet (5 mg total) by mouth daily.   LUMIGAN 0.01 % SOLN Place 1 drop into the left eye daily.   metFORMIN (GLUCOPHAGE-XR) 500 MG 24 hr tablet Take 1 tablet (500 mg total) by mouth 2 (two) times daily.   Multiple Vitamin (MULTIVITAMIN) tablet Take 1 tablet by mouth daily.  omeprazole (PRILOSEC) 20 MG capsule TAKE 1 CAPSULE BY MOUTH EVERY DAY   Potassium Citrate 15 MEQ (1620 MG) TBCR Take 1 tablet by mouth 3 (three) times daily.   rosuvastatin (CRESTOR) 10 MG tablet TAKE 1 TABLET BY MOUTH EVERY DAY   sacubitril-valsartan (ENTRESTO) 24-26 MG Take 1 tablet by mouth 2 (two) times daily.   sildenafil (REVATIO) 20 MG tablet TAKE 3-5 TABLETS BY MOUTH EVERY DAY AS NEEDED   SIMBRINZA 1-0.2 % SUSP Place 1 drop into the left eye 2 (two) times daily.   timolol (TIMOPTIC) 0.5 % ophthalmic solution Place 1 drop into both eyes 2 (two) times daily.    Vitamin D3 (VITAMIN D) 25 MCG tablet Take 1 tablet by mouth 2 (two) times daily.    Allergies:    Lipitor [atorvastatin], Lisinopril, Morphine and related, Levofloxacin, Septra [sulfamethoxazole-trimethoprim], and Sulfamethoxazole-trimethoprim   Social History   Tobacco Use   Smoking status: Never   Smokeless tobacco: Never  Substance Use Topics   Alcohol use: No   Drug use: No    Family Hx: The patient's family history includes Colon polyps in his brother; Diabetes in his father; Hypertension in his father and mother. There is no history of Coronary artery disease, Cancer, Esophageal cancer, Stomach cancer, or Rectal cancer.  Review of Systems  Gastrointestinal:  Negative for hematochezia.    EKGs/Labs/Other Test Reviewed:    EKG:  EKG is not ordered today.  The ekg ordered today demonstrates n/a  Recent Labs: 04/06/2021: Hemoglobin 14.9; Platelets 282.0   Recent Lipid Panel Recent Labs    04/06/21 0838  CHOL 110  TRIG 119.0  HDL 31.00*  VLDL 23.8  LDLCALC 56     Risk Assessment/Calculations:         Physical Exam:    VS:  BP 140/90   Pulse 84   Ht '5\' 7"'$  (1.702 m)   Wt 151 lb 12.8 oz (68.9 kg)   SpO2 95%   BMI 23.78 kg/m     Wt Readings from Last 3 Encounters:  12/19/21 151 lb 12.8 oz (68.9 kg)  09/19/21 156 lb 3.2 oz (70.9 kg)  07/20/21 156 lb 6 oz (70.9 kg)    Constitutional:      Appearance: Healthy appearance. Not in distress.  Neck:     Vascular: JVD normal.  Pulmonary:     Effort: Pulmonary effort is normal.     Breath sounds: No wheezing. No rales.  Cardiovascular:     Normal rate. Regular rhythm. Normal S1. Normal S2.      Murmurs: There is no murmur.  Edema:    Peripheral edema absent.  Abdominal:     Palpations: Abdomen is soft.  Skin:    General: Skin is warm and dry.  Neurological:     General: No focal deficit present.     Mental Status: Alert and oriented to person, place and time.     Cranial Nerves: Cranial nerves are intact.        ASSESSMENT & PLAN:   HFmrEF (heart failure with mildly reduced ejection fraction)  ([place]) He has had difficulty with dizziness since his surgery.  When last seen by Dr. Marlou Porch, he was taken off of Entresto to see if this would improve his imbalance and dizziness.  As noted, he had marginal improvement with this.  We discussed the benefits of Entresto as well as the benefits of SGLT2 inhibitors.  He is willing to try Entresto again.  If he notes a significant change  in his symptoms with resuming this, I suggest we try him on valsartan as he has tolerated ARB's alone in the past.  Continue carvedilol 3.125 mg twice daily, empagliflozin 10 mg daily.  Follow-up again in 3 months.  If he has no significant change in symptoms with resuming Entresto, we could consider changing his beta-blocker to an agent with less lipophilic properties such as bisoprolol.  Essential hypertension, benign Blood pressure is uncontrolled.  This is likely due to stopping Entresto.  As noted, he will resume Entresto 24/26 mg twice daily.  Continue carvedilol 3.125 mg twice daily follow-up in 3 months as recommended.  CAD in native artery History of CABG in May 2021.  He is doing well without anginal symptoms.  He stopped aspirin on his own.  We discussed the roles for aspirin and primary prevention as well as secondary prevention.  I explained to him that there is clear benefit to taking aspirin in the setting of secondary prevention.  He will resume aspirin 81 mg daily.  Continue carvedilol 3.125 mg twice daily, rosuvastatin 10 mg daily.           Dispo:  Return in about 3 months (around 03/21/2022) for Routine Follow Up with Dr. Marlou Porch, or Richardson Dopp, PA-C.   Medication Adjustments/Labs and Tests Ordered: Current medicines are reviewed at length with the patient today.  Concerns regarding medicines are outlined above.  Tests Ordered: No orders of the defined types were placed in this encounter.  Medication Changes: Meds ordered this encounter  Medications   aspirin EC 81 MG tablet    Sig: Take 1 tablet  (81 mg total) by mouth daily. Swallow whole.    Dispense:  90 tablet    Refill:  3   sacubitril-valsartan (ENTRESTO) 24-26 MG    Sig: Take 1 tablet by mouth 2 (two) times daily.    Dispense:  60 tablet    Refill:  2   Signed, Richardson Dopp, PA-C  12/19/2021 12:58 PM    [address], [place], [place]  [id number] Phone: [phone]; Fax: [phone]

## 2021-12-19 ENCOUNTER — Ambulatory Visit: Payer: PPO | Admitting: Physician Assistant

## 2021-12-19 ENCOUNTER — Encounter: Payer: Self-pay | Admitting: Physician Assistant

## 2021-12-19 VITALS — BP 140/90 | HR 84 | Ht 67.0 in | Wt 151.8 lb

## 2021-12-19 DIAGNOSIS — I2510 Atherosclerotic heart disease of native coronary artery without angina pectoris: Secondary | ICD-10-CM | POA: Diagnosis not present

## 2021-12-19 DIAGNOSIS — I10 Essential (primary) hypertension: Secondary | ICD-10-CM

## 2021-12-19 DIAGNOSIS — I5020 Unspecified systolic (congestive) heart failure: Secondary | ICD-10-CM | POA: Diagnosis not present

## 2021-12-19 MED ORDER — ENTRESTO 24-26 MG PO TABS: 1.0000 | ORAL_TABLET | Freq: Two times a day (BID) | ORAL | 2 refills | Status: DC

## 2021-12-19 MED ORDER — ASPIRIN 81 MG PO TBEC: 81.0000 mg | DELAYED_RELEASE_TABLET | Freq: Every day | ORAL | 3 refills | Status: DC

## 2021-12-19 NOTE — Assessment & Plan Note (Signed)
Blood pressure is uncontrolled.  This is likely due to stopping Entresto.  As noted, he will resume Entresto 24/26 mg twice daily.  Continue carvedilol 3.125 mg twice daily follow-up in 3 months as recommended.

## 2021-12-19 NOTE — Assessment & Plan Note (Signed)
He has had difficulty with dizziness since his surgery.  When last seen by Dr. Marlou Porch, he was taken off of Entresto to see if this would improve his imbalance and dizziness.  As noted, he had marginal improvement with this.  We discussed the benefits of Entresto as well as the benefits of SGLT2 inhibitors.  He is willing to try Entresto again.  If he notes a significant change in his symptoms with resuming this, I suggest we try him on valsartan as he has tolerated ARB's alone in the past.  Continue carvedilol 3.125 mg twice daily, empagliflozin 10 mg daily.  Follow-up again in 3 months.  If he has no significant change in symptoms with resuming Entresto, we could consider changing his beta-blocker to an agent with less lipophilic properties such as bisoprolol.

## 2021-12-19 NOTE — Patient Instructions (Addendum)
Medication Instructions:  Your physician has recommended you make the following change in your medication:   RESTART Entresto 24-26 taking 1 twice a day  RESTART Aspirin 81 mg daily  *If you need a refill on your cardiac medications before your next appointment, please call your pharmacy*   Lab Work: None ordered  If you have labs (blood work) drawn today and your tests are completely normal, you will receive your results only by: [place] (if you have MyChart) OR A paper copy in the mail If you have any lab test that is abnormal or we need to change your treatment, we will call you to review the results.   Testing/Procedures: None ordered   Follow-Up: At [facility] you and your health needs are our priority.  As part of our continuing mission to provide you with exceptional heart care, we have created designated Provider Care Teams.  These Care Teams include your primary Cardiologist (physician) and Advanced Practice Providers (APPs -  Physician Assistants and Nurse Practitioners) who all work together to provide you with the care you need, when you need it.  We recommend signing up for the patient portal called "MyChart".  Sign up information is provided on this After Visit Summary.  MyChart is used to connect with patients for Virtual Visits (Telemedicine).  Patients are able to view lab/test results, encounter notes, upcoming appointments, etc.  Non-urgent messages can be sent to your provider as well.   To learn more about what you can do with MyChart, go to [email or web address].    Your next appointment:   3 month(s)   03/27/22 ARRIVE AT 10:05  The format for your next appointment:   In Person  Provider:   Candee Furbish, MD  or Richardson Dopp, PA-C         Other Instructions   Important Information About Sugar

## 2021-12-19 NOTE — Assessment & Plan Note (Signed)
History of CABG in May 2021.  He is doing well without anginal symptoms.  He stopped aspirin on his own.  We discussed the roles for aspirin and primary prevention as well as secondary prevention.  I explained to him that there is clear benefit to taking aspirin in the setting of secondary prevention.  He will resume aspirin 81 mg daily.  Continue carvedilol 3.125 mg twice daily, rosuvastatin 10 mg daily.

## 2021-12-22 ENCOUNTER — Other Ambulatory Visit
Admission: RE | Admit: 2021-12-22 | Discharge: 2021-12-22 | Disposition: A | Discharge status: Home / Self Care | Payer: PPO | Source: Ambulatory Visit | Attending: Ophthalmology | Admitting: Ophthalmology

## 2021-12-22 DIAGNOSIS — H18513 Endothelial corneal dystrophy, bilateral: Secondary | ICD-10-CM | POA: Diagnosis not present

## 2021-12-22 DIAGNOSIS — H401122 Primary open-angle glaucoma, left eye, moderate stage: Secondary | ICD-10-CM | POA: Diagnosis not present

## 2021-12-22 DIAGNOSIS — G4452 New daily persistent headache (NDPH): Secondary | ICD-10-CM | POA: Insufficient documentation

## 2021-12-22 DIAGNOSIS — H2512 Age-related nuclear cataract, left eye: Secondary | ICD-10-CM | POA: Diagnosis not present

## 2021-12-22 LAB — CBC WITH DIFFERENTIAL/PLATELET
Abs Immature Granulocytes: 0.01 10*3/uL (ref 0.00–0.07)
Basophils Absolute: 0.1 10*3/uL (ref 0.0–0.1)
Basophils Relative: 1 %
Eosinophils Absolute: 0.1 10*3/uL (ref 0.0–0.5)
Eosinophils Relative: 2 %
HCT: 52.7 % — ABNORMAL HIGH (ref 39.0–52.0)
Hemoglobin: 17.1 g/dL — ABNORMAL HIGH (ref 13.0–17.0)
Immature Granulocytes: 0 %
Lymphocytes Relative: 15 %
Lymphs Abs: 0.9 10*3/uL (ref 0.7–4.0)
MCH: 28 pg (ref 26.0–34.0)
MCHC: 32.4 g/dL (ref 30.0–36.0)
MCV: 86.3 fL (ref 80.0–100.0)
Monocytes Absolute: 0.5 10*3/uL (ref 0.1–1.0)
Monocytes Relative: 9 %
Neutro Abs: 4.2 10*3/uL (ref 1.7–7.7)
Neutrophils Relative %: 73 %
Platelets: 301 10*3/uL (ref 150–400)
RBC: 6.11 MIL/uL — ABNORMAL HIGH (ref 4.22–5.81)
RDW: 16.5 % — ABNORMAL HIGH (ref 11.5–15.5)
WBC: 5.8 10*3/uL (ref 4.0–10.5)
nRBC: 0 % (ref 0.0–0.2)

## 2021-12-22 LAB — SEDIMENTATION RATE: Sed Rate: 4 mm/hr (ref 0–20)

## 2021-12-22 LAB — C-REACTIVE PROTEIN: CRP: 0.5 mg/dL (ref <1.0)

## 2021-12-30 ENCOUNTER — Other Ambulatory Visit: Payer: Self-pay | Admitting: Internal Medicine

## 2022-01-02 ENCOUNTER — Other Ambulatory Visit: Payer: Self-pay | Admitting: Internal Medicine

## 2022-01-02 DIAGNOSIS — E1121 Type 2 diabetes mellitus with diabetic nephropathy: Secondary | ICD-10-CM

## 2022-01-08 ENCOUNTER — Other Ambulatory Visit: Payer: Self-pay | Admitting: Internal Medicine

## 2022-01-12 ENCOUNTER — Encounter: Payer: Self-pay | Admitting: Podiatry

## 2022-01-12 ENCOUNTER — Ambulatory Visit (INDEPENDENT_AMBULATORY_CARE_PROVIDER_SITE_OTHER): Payer: PPO | Admitting: Podiatry

## 2022-01-12 DIAGNOSIS — M79674 Pain in right toe(s): Secondary | ICD-10-CM | POA: Diagnosis not present

## 2022-01-12 DIAGNOSIS — E1142 Type 2 diabetes mellitus with diabetic polyneuropathy: Secondary | ICD-10-CM | POA: Diagnosis not present

## 2022-01-12 DIAGNOSIS — B351 Tinea unguium: Secondary | ICD-10-CM

## 2022-01-12 DIAGNOSIS — M79675 Pain in left toe(s): Secondary | ICD-10-CM

## 2022-01-12 DIAGNOSIS — N1831 Chronic kidney disease, stage 3a: Secondary | ICD-10-CM

## 2022-01-12 DIAGNOSIS — M2041 Other hammer toe(s) (acquired), right foot: Secondary | ICD-10-CM

## 2022-01-12 NOTE — Progress Notes (Signed)
This patient returns to my office for at risk foot care.  This patient requires this care by a professional since this patient will be at risk due to having  Diabetes and CKD.    This patient is unable to cut nails himself since the patient cannot reach his nails.These nails are painful walking and wearing shoes.  This patient presents for at risk foot care today.  General Appearance  Alert, conversant and in no acute stress.  Vascular  Dorsalis pedis and posterior tibial  pulses are palpable  bilaterally.  Capillary return is within normal limits  bilaterally. Temperature is within normal limits  bilaterally.  Neurologic  Senn-Weinstein monofilament wire test diminished  bilaterally. Muscle power within normal limits bilaterally.  Nails Thick disfigured discolored nails with subungual debris  from hallux to fifth toes bilaterally. No evidence of bacterial infection or drainage bilaterally.  Orthopedic  No limitations of motion  feet .  No crepitus or effusions noted.  No bony pathology or digital deformities noted.  Skin  normotropic skin with no porokeratosis noted bilaterally.  No signs of infections or ulcers noted.     Onychomycosis  Pain in right toes  Pain in left toes  Porokeratosis  B/L.  Consent was obtained for treatment procedures.   Mechanical debridement of nails 1-5  bilaterally performed with a nail nipper.  Filed with dremel without incident. No infection or ulcer.     Return office visit  12 weeks                Told patient to return for periodic foot care and evaluation due to potential at risk complications.   Gardiner Barefoot DPM

## 2022-01-18 ENCOUNTER — Other Ambulatory Visit (INDEPENDENT_AMBULATORY_CARE_PROVIDER_SITE_OTHER): Payer: PPO

## 2022-01-18 DIAGNOSIS — E1121 Type 2 diabetes mellitus with diabetic nephropathy: Secondary | ICD-10-CM

## 2022-01-18 LAB — LIPID PANEL
Cholesterol: 146 mg/dL (ref 0–200)
HDL: 35.8 mg/dL — ABNORMAL LOW (ref >39.00)
LDL Cholesterol: 88 mg/dL (ref 0–99)
NonHDL: 109.75
Total CHOL/HDL Ratio: 4
Triglycerides: 110 mg/dL (ref 0.0–149.0)
VLDL: 22 mg/dL (ref 0.0–40.0)

## 2022-01-18 LAB — COMPREHENSIVE METABOLIC PANEL
ALT: 15 U/L (ref 0–53)
AST: 13 U/L (ref 0–37)
Albumin: 4.2 g/dL (ref 3.5–5.2)
Alkaline Phosphatase: 50 U/L (ref 39–117)
BUN: 25 mg/dL — ABNORMAL HIGH (ref 6–23)
CO2: 27 mEq/L (ref 19–32)
Calcium: 9 mg/dL (ref 8.4–10.5)
Chloride: 105 mEq/L (ref 96–112)
Creatinine, Ser: 1.05 mg/dL (ref 0.40–1.50)
GFR: 66.4 mL/min (ref >60.00)
Glucose, Bld: 178 mg/dL — ABNORMAL HIGH (ref 70–99)
Potassium: 3.6 mEq/L (ref 3.5–5.1)
Sodium: 144 mEq/L (ref 135–145)
Total Bilirubin: 0.5 mg/dL (ref 0.2–1.2)
Total Protein: 6.2 g/dL (ref 6.0–8.3)

## 2022-01-18 LAB — CBC
HCT: 48.9 % (ref 39.0–52.0)
Hemoglobin: 16.2 g/dL (ref 13.0–17.0)
MCHC: 33.1 g/dL (ref 30.0–36.0)
MCV: 87.1 fl (ref 78.0–100.0)
Platelets: 286 10*3/uL (ref 150.0–400.0)
RBC: 5.62 Mil/uL (ref 4.22–5.81)
RDW: 16.3 % — ABNORMAL HIGH (ref 11.5–15.5)
WBC: 6.6 10*3/uL (ref 4.0–10.5)

## 2022-01-18 LAB — T4, FREE: Free T4: 1.42 ng/dL (ref 0.60–1.60)

## 2022-01-18 LAB — HEMOGLOBIN A1C: Hgb A1c MFr Bld: 7.6 % — ABNORMAL HIGH (ref 4.6–6.5)

## 2022-01-24 ENCOUNTER — Ambulatory Visit (INDEPENDENT_AMBULATORY_CARE_PROVIDER_SITE_OTHER): Payer: PPO | Admitting: Internal Medicine

## 2022-01-24 ENCOUNTER — Encounter: Payer: Self-pay | Admitting: Internal Medicine

## 2022-01-24 VITALS — BP 128/88 | HR 76 | Temp 98.1°F | Ht 65.5 in | Wt 148.0 lb

## 2022-01-24 DIAGNOSIS — Z0000 Encounter for general adult medical examination without abnormal findings: Secondary | ICD-10-CM | POA: Diagnosis not present

## 2022-01-24 DIAGNOSIS — N400 Enlarged prostate without lower urinary tract symptoms: Secondary | ICD-10-CM | POA: Diagnosis not present

## 2022-01-24 DIAGNOSIS — I5022 Chronic systolic (congestive) heart failure: Secondary | ICD-10-CM

## 2022-01-24 DIAGNOSIS — N1831 Chronic kidney disease, stage 3a: Secondary | ICD-10-CM

## 2022-01-24 DIAGNOSIS — E1121 Type 2 diabetes mellitus with diabetic nephropathy: Secondary | ICD-10-CM | POA: Diagnosis not present

## 2022-01-24 DIAGNOSIS — K219 Gastro-esophageal reflux disease without esophagitis: Secondary | ICD-10-CM | POA: Diagnosis not present

## 2022-01-24 LAB — HM DIABETES FOOT EXAM

## 2022-01-24 NOTE — Progress Notes (Signed)
Subjective:    Patient ID: James Braun, male    DOB: 1941/06/01, 81 y.o.   MRN: [id number]  HPI Here for Medicare wellness visit and follow up of chronic health conditions Reviewed form and advanced directives Reviewed other doctors Glaucoma on left, Fuch's dystrophy on right. Working with specialist. Some decreased vision Hearing is down some--just ordered hearing aides No alcohol or tobacco Regular walking--several times a week, plus yard work No falls No depression or anhedonia Independent with instrumental ADLs No sig memory issues  Using continuous glucose monitor Will go up in the morning---better if he has just a boiled egg  Any carbs will have it go up Average 149 Morning 120's---but climbs through the morning Reviewed the increased A1c--he feels he can improve this No foot numbness, tingling or burning. Sees podiatrist for callouses  Last GFR was some better--up to 4  Tried off the entresto for 3 months with Dr Luther Parody He felt a little better off it Now put back on it again---might have slight lightheadedness on it Has some persistent chest pain---seems to be the sternum since surgery No syncope No edema Sleeps flat---no PND (now has nocturia x 1) No palpitations  Takes prilosec daily No heartburn No dysphagia  Voids okay in the day--frequently on the jardiance [place] stream on the med Nocturia x 1 Takes metamucil to help bowels--this helps with voiding No blood in stool  Current Outpatient Medications on File Prior to Visit  Medication Sig Dispense Refill   alum hydroxide-mag trisilicate (GAVISCON) 49-44 MG CHEW chewable tablet Chew 2 tablets by mouth 3 (three) times daily as needed for indigestion or heartburn.     aspirin EC 81 MG tablet Take 1 tablet (81 mg total) by mouth daily. Swallow whole. 90 tablet 3   carvedilol (COREG) 3.125 MG tablet Take 1 tablet (3.125 mg total) by mouth 2 (two) times daily. 60 tablet 6   Continuous Blood Gluc Sensor  (FREESTYLE LIBRE 2 SENSOR) MISC 1 each by Does not apply route every 14 (fourteen) days. 2 each 12   Cyanocobalamin (VITAMIN B-12) 1000 MCG SUBL Place 1,000 mcg under the tongue. 1000 mcg SL daily     empagliflozin (JARDIANCE) 10 MG TABS tablet Take 1 tablet (10 mg total) by mouth daily before breakfast. 90 tablet 3   ferrous sulfate 325 (65 FE) MG tablet Take 325 mg by mouth daily with breakfast.     finasteride (PROSCAR) 5 MG tablet Take 1 tablet (5 mg total) by mouth daily. 90 tablet 3   LUMIGAN 0.01 % SOLN Place 1 drop into the left eye daily.     metFORMIN (GLUCOPHAGE-XR) 500 MG 24 hr tablet Take 1 tablet (500 mg total) by mouth 2 (two) times daily. 180 tablet 3   Multiple Vitamin (MULTIVITAMIN) tablet Take 1 tablet by mouth daily.     omeprazole (PRILOSEC) 20 MG capsule TAKE 1 CAPSULE BY MOUTH EVERY DAY 90 capsule 3   Potassium Citrate 15 MEQ (1620 MG) TBCR Take 1 tablet by mouth 3 (three) times daily. 270 tablet 3   rosuvastatin (CRESTOR) 10 MG tablet TAKE 1 TABLET BY MOUTH EVERY DAY 90 tablet 1   sacubitril-valsartan (ENTRESTO) 24-26 MG Take 1 tablet by mouth 2 (two) times daily. 60 tablet 2   sildenafil (REVATIO) 20 MG tablet TAKE 3-5 TABLETS BY MOUTH EVERY DAY AS NEEDED 50 tablet 5   SIMBRINZA 1-0.2 % SUSP Place 1 drop into the left eye 2 (two) times daily.  timolol (TIMOPTIC) 0.5 % ophthalmic solution Place 1 drop into both eyes 2 (two) times daily.      Vitamin D3 (VITAMIN D) 25 MCG tablet Take 1 tablet by mouth 2 (two) times daily.     No current facility-administered medications on file prior to visit.    Allergies  Allergen Reactions   Lipitor [Atorvastatin] Other (See Comments)    myalgias   Lisinopril    Morphine And Related Other (See Comments)     hypotension   Levofloxacin Rash   Septra [Sulfamethoxazole-Trimethoprim] Rash   Sulfamethoxazole-Trimethoprim Rash    Past Medical History:  Diagnosis Date   BPH (benign prostatic hypertrophy)    Cancer (HCC)     melanoma   Coronary artery disease    History of kidney stones    History of melanoma excision OCT 2010   Hyperlipidemia    Hypertension    Left ureteral calculus    Nephrolithiasis    bilateral   Type 2 diabetes mellitus ([place])     Past Surgical History:  Procedure Laterality Date   CARDIAC CATHETERIZATION     CHOLECYSTECTOMY  july 2000   CORONARY ARTERY BYPASS GRAFT N/A 11/28/2019   Procedure: CORONARY ARTERY BYPASS GRAFTING (CABG) x3, using left internal mammary artery and Saphenous vein harvested endoscopically;  Surgeon: [name], MD;  Location: [facility];  Service: Open Heart Surgery;  Laterality: N/A;   CYSTOSCOPY WITH STENT PLACEMENT Left 11/14/2012   Procedure: CYSTOSCOPY WITH STENT PLACEMENT;  Surgeon: [name], MD;  Location[facility];  Service: Urology;  Laterality: Left;   CYSTOSCOPY/RETROGRADE/URETEROSCOPY/STONE EXTRACTION WITH BASKET Left 11/14/2012   Procedure: CYSTOSCOPY/RETROGRADE/URETEROSCOPY/STONE EXTRACTION WITH BASKET;  Surgeon: [name], MD;  Location[facility];  Service: Urology;  Laterality: Left;   EXTRACORPOREAL SHOCK WAVE LITHOTRIPSY  JULY 2000   X2   HOLMIUM LASER APPLICATION Left 12/21/6193   Procedure: HOLMIUM LASER APPLICATION;  Surgeon: [name], MD;  Location: [facility];  Service: Urology;  Laterality: Left;   LEFT HEART CATH AND CORONARY ANGIOGRAPHY N/A 11/27/2019   Procedure: LEFT HEART CATH AND CORONARY ANGIOGRAPHY;  Surgeon: [name], MD;  Location: [place] [facility];  Service: Cardiovascular;  Laterality: N/A;   LEFT URETEROSCOPIC STONE EXTRACTION  02-22-2007   NEPHROLITHOTOMY Right 09-28-2010   percutaneous   ORIF CLAVICLE FRACTURE  2006   PROSTATE BIOPSY  1998   neg   SHOULDER ARTHROSCOPY  1/15   Dr [name]   TEE WITHOUT CARDIOVERSION N/A 11/28/2019   Procedure: TRANSESOPHAGEAL ECHOCARDIOGRAM (TEE);  Surgeon: [name], Collier Salina, MD;  Location: [place];  Service:  Open Heart Surgery;  Laterality: N/A;   TONSILLECTOMY      Family History  Problem Relation Age of Onset   Hypertension Mother    Diabetes Father    Hypertension Father    Colon polyps Brother    Coronary artery disease Neg Hx    Cancer Neg Hx    Esophageal cancer Neg Hx    Stomach cancer Neg Hx    Rectal cancer Neg Hx     Social History   Socioeconomic History   Marital status: Married    Spouse name: Not on file   Number of children: 2   Years of education: Not on file   Highest education level: Not on file  Occupational History   Occupation: Research officer, 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   Occupation: Buys very distressed houses and rehabilitates   Occupation: Does mediation  Tobacco Use  Smoking status: Never    Passive exposure: Never   Smokeless tobacco: Never  Substance and Sexual Activity   Alcohol use: No   Drug use: No   Sexual activity: Not on file  Other Topics Concern   Not on file  Social History Narrative   Has living will   Wife is health care POA---alternate is committee of physicians   Would accept resuscitation---wouldn't accept prolonged ventilation   No tube feeds if cognitively unaware   Right handed   Social Determinants of Health   Financial Resource Strain: Low Risk  (02/21/2021)   Overall Financial Resource Strain (CARDIA)    Difficulty of Paying Living Expenses: Not hard at all  Food Insecurity: Not on file  Transportation Needs: Not on file  Physical Activity: Not on file  Stress: Not on file  Social Connections: Not on file  Intimate Partner Violence: Not on file   Review of Systems Appetite is good Weight is stable Sleeps okay Wears seat belt Teeth okay---keeps up with dentist No sig back or joint pain No suspicious skin lesions now--going back to Dr Nevada Crane soon    Objective:   Physical Exam Constitutional:      Appearance: Normal appearance.  HENT:     Mouth/Throat:     Comments: No lesions Eyes:     Conjunctiva/sclera:  Conjunctivae normal.     Pupils: Pupils are equal, round, and reactive to light.  Cardiovascular:     Rate and Rhythm: Normal rate and regular rhythm.     Pulses: Normal pulses.     Heart sounds: No murmur heard.    No gallop.  Pulmonary:     Effort: Pulmonary effort is normal.     Breath sounds: Normal breath sounds. No wheezing or rales.  Abdominal:     Palpations: Abdomen is soft.     Tenderness: There is no abdominal tenderness.  Musculoskeletal:     Cervical back: Neck supple.     Right lower leg: No edema.     Left lower leg: No edema.  Lymphadenopathy:     Cervical: No cervical adenopathy.  Skin:    Findings: No lesion or rash.     Comments: No foot lesions  Neurological:     General: No focal deficit present.     Mental Status: He is alert and oriented to person, place, and time.     Comments: Mini-cog okay---clock good, recall 2/3 Normal sensation in feet  Psychiatric:        Mood and Affect: Mood normal.        Behavior: Behavior normal.            Assessment & Plan:

## 2022-01-24 NOTE — Assessment & Plan Note (Signed)
Back on the entresto 24/26 bid---encouraged him to continue unless clear significant problems with it Also carvedilol 3.125 bid, jardiance

## 2022-01-24 NOTE — Assessment & Plan Note (Signed)
I have personally reviewed the Medicare Annual Wellness questionnaire and have noted 1. The patient's medical and social history 2. Their use of alcohol, tobacco or illicit drugs 3. Their current medications and supplements 4. The patient's functional ability including ADL's, fall risks, home safety risks and hearing or visual             impairment. 5. Diet and physical activities 6. Evidence for depression or mood disorders  The patients weight, height, BMI and visual acuity have been recorded in the chart I have made referrals, counseling and provided education to the patient based review of the above and I have provided the pt with a written personalized care plan for preventive services.  I have provided you with a copy of your personalized plan for preventive services. Please take the time to review along with your updated medication list.  Done with cancer screening Fairly good about exercise Updated COVID and flu vaccines in the fall Consider shingrix at pharmacy

## 2022-01-24 NOTE — Progress Notes (Signed)
Hearing Screening - Comments:: Has ordered hearing aids. They have not come in yet. Vision Screening - Comments:: May 2023

## 2022-01-24 NOTE — Assessment & Plan Note (Signed)
Doing well on omeprazole 20 daily

## 2022-01-24 NOTE — Assessment & Plan Note (Signed)
Last GFR up to 66 on the jardiance

## 2022-01-24 NOTE — Assessment & Plan Note (Signed)
Voids okay on finasteride '5mg'$  daily

## 2022-01-24 NOTE — Assessment & Plan Note (Signed)
Lab Results  Component Value Date   HGBA1C 7.6 (H) 01/18/2022   A1c up quite a bit Is on jardiance 10/metformin 500 bid Consider increasing jardiance next time if A1c up even more

## 2022-01-30 ENCOUNTER — Telehealth: Payer: Self-pay

## 2022-01-30 MED ORDER — POTASSIUM CITRATE ER 15 MEQ (1620 MG) PO TBCR: 1.0000 | EXTENDED_RELEASE_TABLET | Freq: Three times a day (TID) | ORAL | 3 refills | Status: DC

## 2022-01-30 NOTE — Telephone Encounter (Signed)
[place] Night - Client Nonclinical Telephone Record  [facility] Client [facility] Night - Client Client Site [place] Provider Ria Bush - MD Contact Type Call Who Is Calling Patient / Member / Family / Caregiver Caller Name Alek Borges Caller Phone Number [phone] Patient Name James Braun Patient DOB 24-May-1941 Call Type Message Only Information Provided Reason for Call Request for General Office Information Initial Comment Caller states ordered refill for potassium citrate, caller states has reached office, states needing prescription sent to pharmacy. no symptoms. Disp. Time Disposition Final User 01/30/2022 7:53:05 AM General Information Provided Yes Melburn Hake, [place] Call Closed By: [employer] Transaction Date/Time: 01/30/2022 7:50:06 AM (ET

## 2022-01-30 NOTE — Telephone Encounter (Signed)
Rx sent electronically. To mail order.

## 2022-02-08 DIAGNOSIS — X32XXXD Exposure to sunlight, subsequent encounter: Secondary | ICD-10-CM | POA: Diagnosis not present

## 2022-02-08 DIAGNOSIS — L570 Actinic keratosis: Secondary | ICD-10-CM | POA: Diagnosis not present

## 2022-02-08 DIAGNOSIS — L820 Inflamed seborrheic keratosis: Secondary | ICD-10-CM | POA: Diagnosis not present

## 2022-02-24 DIAGNOSIS — H401132 Primary open-angle glaucoma, bilateral, moderate stage: Secondary | ICD-10-CM | POA: Diagnosis not present

## 2022-02-27 ENCOUNTER — Telehealth: Payer: Self-pay

## 2022-02-27 NOTE — Progress Notes (Signed)
Chronic Care Management Pharmacy Assistant   Name: James Braun  MRN: [id number] DOB: [birth year]  Reason for Encounter: CCM (Appointment Reminder)  Recent office visits:  01/24/22 Viviana Simpler, MD Medicare Wellness No med changes FU 6 months 07/20/21 Viviana Simpler, MD: Diabetes A1c 6.8 04/18/21-PCP-Telemedicine-Patient presented with a cough,body aches and fever 100.4 for 3 days,positive home covid test. Started molnupiravir '200mg'$  take 4 capsules 2 times daily for 5 days. Encouraged fluids and rest.Reviewed further supportive care measures at home including vit C '500mg'$  bid, vit D 2000 IU daily, zinc '100mg'$  daily, tylenol PRN, pepcid '20mg'$  BID PRN. 04/13/21-PCP-Patient presented for follow up diabetes and CHF.Will stop the spironolactone due to gynecomastia Will add the jardiance to entresto.follow up 3 months   Recent consult visits:  01/12/22 Gardiner Barefoot, DPM (Podiatry): Callouses Procedure: debridement of nails 1-5 12/19/21 Richardson Dopp, PA (Cardiology) HFrEF Start: Aspirin 81 mg Start: Sacubitril-Valsartan 24-26 mg FU 3 months 10/13/21 Gardiner Barefoot, DPM (Podiatry): Nail problem Procedure: debridement of nails 1-5 09/19/21 Candee Furbish, MD (Cardiology): CAD in native artery. Stop (provider) Sacubitril-Valsartan 24-26 mg Stop (Patient): Entresto. F/U 3 months 09/02/21 Birder Robson (Ophthalmology):  Glaucoma 06/27/21 Birder Robson (Ophthalmology):  Glaucoma 07/07/21 Gardiner Barefoot, DPM (Podiatry): Callouses Procedure: debridement of nails 1-5 06/27/21 Birder Robson (Ophthalmology): Diabetes eye exam 04/07/21-Podiatry- Patient presented for toe nail trimming,no medication changes  Hospital visits:  None in previous 6 months  Medications: Outpatient Encounter Medications as of 02/27/2022  Medication Sig   alum hydroxide-mag trisilicate (GAVISCON) 93-81 MG CHEW chewable tablet Chew 2 tablets by mouth 3 (three) times daily as needed for indigestion or heartburn.   aspirin EC  81 MG tablet Take 1 tablet (81 mg total) by mouth daily. Swallow whole.   carvedilol (COREG) 3.125 MG tablet Take 1 tablet (3.125 mg total) by mouth 2 (two) times daily.   Continuous Blood Gluc Sensor (FREESTYLE LIBRE 2 SENSOR) MISC 1 each by Does not apply route every 14 (fourteen) days.   Cyanocobalamin (VITAMIN B-12) 1000 MCG SUBL Place 1,000 mcg under the tongue. 1000 mcg SL daily   empagliflozin (JARDIANCE) 10 MG TABS tablet Take 1 tablet (10 mg total) by mouth daily before breakfast.   ferrous sulfate 325 (65 FE) MG tablet Take 325 mg by mouth daily with breakfast.   finasteride (PROSCAR) 5 MG tablet Take 1 tablet (5 mg total) by mouth daily.   LUMIGAN 0.01 % SOLN Place 1 drop into the left eye daily.   metFORMIN (GLUCOPHAGE-XR) 500 MG 24 hr tablet Take 1 tablet (500 mg total) by mouth 2 (two) times daily.   Multiple Vitamin (MULTIVITAMIN) tablet Take 1 tablet by mouth daily.   omeprazole (PRILOSEC) 20 MG capsule TAKE 1 CAPSULE BY MOUTH EVERY DAY   Potassium Citrate 15 MEQ (1620 MG) TBCR Take 1 tablet by mouth 3 (three) times daily.   rosuvastatin (CRESTOR) 10 MG tablet TAKE 1 TABLET BY MOUTH EVERY DAY   sacubitril-valsartan (ENTRESTO) 24-26 MG Take 1 tablet by mouth 2 (two) times daily.   sildenafil (REVATIO) 20 MG tablet TAKE 3-5 TABLETS BY MOUTH EVERY DAY AS NEEDED   SIMBRINZA 1-0.2 % SUSP Place 1 drop into the left eye 2 (two) times daily.   timolol (TIMOPTIC) 0.5 % ophthalmic solution Place 1 drop into both eyes 2 (two) times daily.    Vitamin D3 (VITAMIN D) 25 MCG tablet Take 1 tablet by mouth 2 (two) times daily.   No facility-administered encounter medications on file as of 02/27/2022.  Kaj Vasil Kanner was contacted to remind of upcoming telephone visit with Charlene Brooke on 03/02/22 at 11:00. Patient was reminded to have any blood glucose and blood pressure readings available for review at appointment.  Patient stated he had other things going on that day and he could answer  at 11:00, but if the call did not come in at the time stated he couldn't be so sure he would answer. I assured patient the appointment was at 11:00 and Mendel Ryder would be calling at 11:00.   Patient confirmed appointment.   Are you having any problems with your medications? No   Do you have any concerns you like to discuss with the pharmacist? No  CCM referral has been placed prior to visit?  No   Star Rating Drugs: Medication:    Last Fill: Day Supply Metformin 500 mg   10/27/2021 90  - [facility]atin 10 mg   [facility]21 [facility]  Sacubitril-Valsartan 24-26 mg 08/18/2021 90 - [facility] Fill dates verified with [facility]  I inquired about patient's medications and his last filled dates. I asked if patient was still taking the above medications and where he was having them filled. Patient was unwilling to answer for me and stated he would be ready to answer the questions for the call.  Charlene Brooke, CPP notified  Marijean Niemann, [place] Clinical Pharmacy Assistant [phone]

## 2022-03-02 ENCOUNTER — Ambulatory Visit: Payer: PPO | Admitting: Pharmacist

## 2022-03-02 DIAGNOSIS — I10 Essential (primary) hypertension: Secondary | ICD-10-CM

## 2022-03-02 DIAGNOSIS — E7800 Pure hypercholesterolemia, unspecified: Secondary | ICD-10-CM

## 2022-03-02 DIAGNOSIS — I2510 Atherosclerotic heart disease of native coronary artery without angina pectoris: Secondary | ICD-10-CM

## 2022-03-02 DIAGNOSIS — E1121 Type 2 diabetes mellitus with diabetic nephropathy: Secondary | ICD-10-CM

## 2022-03-02 DIAGNOSIS — I5020 Unspecified systolic (congestive) heart failure: Secondary | ICD-10-CM

## 2022-03-02 NOTE — Progress Notes (Signed)
Chronic Care Management Pharmacy Note  03/02/2022 Name:  James Braun MRN:  [id number] DOB:  1940-12-14  Summary: CCM F/U visit -Pt has been off carvedilol for many months (last eRx was 04/2020 x 6 month supply); last EF was >40% so beta blocker is not necessarily recommended for GDMT in HF. BP/HR have been at goal in recent office visits. He remains on Entresto and Jardiance -Pt stops rosuvastatin for weeks at a time due to side effects (achiness, fatigue); last LDL was 88 (01/2022), increased from 56 last year. LDL goal < 70 given hx of CABG  Recommendations/Changes made from today's visit: -Reasonable to remain off carvedilol; will alert cardiology of change -Advised to take rosuvastatin every other day rather than stopping for weeks at a time  Plan: -[place] will call patient 6 months for general update -Pharmacist follow up televisit scheduled for 1 year -PCP F/U 08/01/22    Subjective: James Braun is an 81 y.o. year old male who is a primary patient of [name], MD.  The CCM team was consulted for assistance with disease management and care coordination needs.    Engaged with patient by telephone for follow up visit in response to provider referral for pharmacy case management and/or care coordination services.   Consent to Services:  The patient was given information about Chronic Care Management services, agreed to services, and gave verbal consent prior to initiation of services.  Please see initial visit note for detailed documentation.   Patient Care Team: [name], MD as PCP - General Bensimhon, Shaune Pascal, MD as PCP - Advanced Heart Failure (Cardiology) Jerline Pain, MD as PCP - Cardiology (Cardiology) Charlton Haws, [facility] as Pharmacist (Pharmacist)  Recent office visits: 01/24/22 Viviana Simpler, MD: Annual visit - A1c up 7.6%, consider increasing Jardiance next time.  07/20/21 Viviana Simpler, MD: Diabetes A1c 6.8  Recent  consult visits: 12/19/21 Richardson Dopp, PA (Cardiology): HFrEF - pt had stopped aspirin on his own, and Delene Loll was held d/t dizziness. Restart: Aspirin 81 mg, Entresto 24-26 mg FU 3 months.  09/19/21 Candee Furbish, MD (Cardiology): CAD in native artery. Hold Entresto d/t dizziness.  09/02/21 Birder Robson (Ophthalmology):  [facility] visits: None in previous 6 months   Objective:  Lab Results  Component Value Date   CREATININE 1.05 01/18/2022   BUN 25 (H) 01/18/2022   GFR 66.40 01/18/2022   GFRNONAA >60 11/08/2020   GFRAA 49 (L) 01/06/2020   NA 144 01/18/2022   K 3.6 01/18/2022   CALCIUM 9.0 01/18/2022   CO2 27 01/18/2022   GLUCOSE 178 (H) 01/18/2022    Lab Results  Component Value Date/Time   HGBA1C 7.6 (H) 01/18/2022 07:56 AM   HGBA1C 6.8 (A) 07/20/2021 10:14 AM   HGBA1C 8.3 (H) 04/06/2021 08:38 AM   GFR 66.40 01/18/2022 07:56 AM   GFR 54.91 (L) 09/07/2020 07:51 AM   MICROALBUR 12.0 (H) 02/15/2015 02:33 PM   MICROALBUR 8.8 (H) 12/25/2011 10:25 AM    Last diabetic Eye exam:  Lab Results  Component Value Date/Time   HMDIABEYEEXA No Retinopathy 06/28/2021 12:00 AM    Last diabetic Foot exam:  Lab Results  Component Value Date/Time   HMDIABFOOTEX done 01/24/2022 12:00 AM     Lab Results  Component Value Date   CHOL 146 01/18/2022   HDL 35.80 (L) 01/18/2022   LDLCALC 88 01/18/2022   LDLDIRECT 141.5 02/03/2013   TRIG 110.0 01/18/2022   CHOLHDL 4 01/18/2022  Latest Ref Rng & Units 01/18/2022    7:56 AM 11/08/2020    1:52 PM 09/07/2020    7:51 AM  Hepatic Function  Total Protein 6.0 - 8.3 g/dL 6.2  6.9    Albumin 3.5 - 5.2 g/dL 4.2  4.0  4.3   AST 0 - 37 U/L 13  17    ALT 0 - 53 U/L 15  21    Alk Phosphatase 39 - 117 U/L 50  55    Total Bilirubin 0.2 - 1.2 mg/dL 0.5  0.3      Lab Results  Component Value Date/Time   TSH 4.384 11/28/2019 05:11 AM   TSH 2.84 02/03/2013 09:07 AM   TSH 3.37 12/27/2010 08:53 AM   FREET4 1.42 01/18/2022  07:56 AM   FREET4 0.65 03/17/2020 08:24 AM       Latest Ref Rng & Units 01/18/2022    7:56 AM 12/22/2021    2:37 PM 04/06/2021    8:38 AM  CBC  WBC 4.0 - 10.5 K/uL 6.6  5.8  4.8   Hemoglobin 13.0 - 17.0 g/dL 16.2  17.1  14.9   Hematocrit 39.0 - 52.0 % 48.9  52.7  46.1   Platelets 150.0 - 400.0 K/uL 286.0  301  282.0     No results found for: "VD25OH"  Clinical ASCVD: Yes  The ASCVD Risk score (Arnett DK, et al., 2019) failed to calculate for the following reasons:   The 2019 ASCVD risk score is only valid for ages 49 to 68       01/24/2022   12:06 PM 04/16/2020   11:26 AM 03/24/2020    9:49 AM  Depression screen PHQ 2/9  Decreased Interest 0 0 0  Down, Depressed, Hopeless 0 0 0  PHQ - 2 Score 0 0 0     Social History   Tobacco Use  Smoking Status Never   Passive exposure: Never  Smokeless Tobacco Never   BP Readings from Last 3 Encounters:  01/24/22 128/88  12/19/21 140/90  09/19/21 128/68   Pulse Readings from Last 3 Encounters:  01/24/22 76  12/19/21 84  09/19/21 81   Wt Readings from Last 3 Encounters:  01/24/22 148 lb (67.1 kg)  12/19/21 151 lb 12.8 oz (68.9 kg)  09/19/21 156 lb 3.2 oz (70.9 kg)   BMI Readings from Last 3 Encounters:  01/24/22 24.25 kg/m  12/19/21 23.78 kg/m  09/19/21 24.46 kg/m    Assessment/Interventions: Review of patient past medical history, allergies, medications, health status, including review of consultants reports, laboratory and other test data, was performed as part of comprehensive evaluation and provision of chronic care management services.   SDOH:  (Social Determinants of Health) assessments and interventions performed: No  SDOH Screenings   Alcohol Screen: Not on file  Depression (PHQ2-9): Low Risk  (01/24/2022)   Depression (PHQ2-9)    PHQ-2 Score: 0  Financial Resource Strain: Low Risk  (02/21/2021)   Overall Financial Resource Strain (CARDIA)    Difficulty of Paying Living Expenses: Not hard at all  Food  Insecurity: Not on file  Housing: Not on file  Physical Activity: Not on file  Social Connections: Not on file  Stress: Not on file  Tobacco Use: Low Risk  (01/24/2022)   Patient History    Smoking Tobacco Use: Never    Smokeless Tobacco Use: Never    Passive Exposure: Never  Transportation Needs: Not on file    [place]  Allergies  Allergen  Reactions   Lipitor [Atorvastatin] Other (See Comments)    myalgias   Lisinopril    Morphine And Related Other (See Comments)     hypotension   Levofloxacin Rash   Septra [Sulfamethoxazole-Trimethoprim] Rash   Sulfamethoxazole-Trimethoprim Rash    Medications Reviewed Today     Reviewed by Charlton Haws, [facility] (Pharmacist) on 03/02/22 at 1121  Med List Status: <None>   Medication Order Taking? Sig Documenting Provider Last Dose Status Informant  alum hydroxide-mag trisilicate (GAVISCON) 22-29 MG CHEW chewable tablet [id number] Yes Chew 2 tablets by mouth 3 (three) times daily as needed for indigestion or heartburn. [provider] Taking Active Self  aspirin EC 81 MG tablet [id number] Yes Take 1 tablet (81 mg total) by mouth daily. Swallow whole. Richardson Dopp T, PA-C Taking Active   Cyanocobalamin (VITAMIN B-12) 1000 MCG SUBL [id number] Yes Place 1,000 mcg under the tongue. 1000 mcg SL daily [provider] Taking Active Self  empagliflozin (JARDIANCE) 10 MG TABS tablet [id number] Yes Take 1 tablet (10 mg total) by mouth daily before breakfast. [name], MD Taking Active   ferrous sulfate 325 (65 FE) MG tablet [id number] Yes Take 325 mg by mouth daily with breakfast. [provider] Taking Active Self  finasteride (PROSCAR) 5 MG tablet [id number] Yes Take 1 tablet (5 mg total) by mouth daily. [name], MD Taking Active   LUMIGAN 0.01 % SOLN [id number] Yes Place 1 drop into the left eye daily. [provider] Taking Active   metFORMIN (GLUCOPHAGE-XR) 500 MG 24 hr tablet [id number] Yes  Take 1 tablet (500 mg total) by mouth 2 (two) times daily. [name], MD Taking Active   Multiple Vitamin (MULTIVITAMIN) tablet [id number] Yes Take 1 tablet by mouth daily. [provider] Taking Active Self  omeprazole (PRILOSEC) 20 MG capsule [id number] Yes TAKE 1 CAPSULE BY MOUTH EVERY DAY [name], MD Taking Active   Potassium Citrate 15 MEQ (1620 MG) TBCR [id number] Yes Take 1 tablet by mouth 3 (three) times daily. [name], MD Taking Active   rosuvastatin (CRESTOR) 10 MG tablet [id number] Yes TAKE 1 TABLET BY MOUTH EVERY DAY [name], MD Taking Active   sacubitril-valsartan [facility]) 24-26 MG [id number] Yes Take 1 tablet by mouth 2 (two) times daily. Richardson Dopp T, PA-C Taking Active   sildenafil (REVATIO) 20 MG tablet [id number] Yes TAKE 3-5 TABLETS BY MOUTH EVERY DAY AS NEEDED [name], MD Taking Active   SIMBRINZA 1-0.2 % SUSP [id number] Yes Place 1 drop into the left eye 2 (two) times daily. [provider] Taking Active   timolol (TIMOPTIC) 0.5 % ophthalmic solution [id number] Yes Place 1 drop into both eyes 2 (two) times daily.  [provider] Taking Active Self  Vitamin D3 (VITAMIN D) 25 MCG tablet [id number] Yes Take 1 tablet by mouth 2 (two) times daily. [provider] Taking Active Self            Patient Active Problem List   Diagnosis Date Noted   HFmrEF (heart failure with mildly reduced ejection fraction) ([place]) 12/18/2021   Calculus of kidney 04/07/2021   Iron deficiency anemia due to chronic blood loss 09/27/2020   Heart failure, systolic, chronic ([place]) 28/76/8115   PAF (paroxysmal atrial fibrillation) ([place]) 12/03/2019   S/P CABG x 3 11/28/2019   CAD in native artery 11/27/2019   GERD (gastroesophageal reflux disease) 06/30/2019   Anemia in chronic kidney disease (CKD) 09/05/2018   Stage 3a  chronic kidney disease ([place]) 03/06/2018   Counseling regarding advanced directives 02/13/2014    Onychomycosis 04/25/2013   Routine general medical examination at a health care facility 01/04/2011   BPH without obstruction/lower urinary tract symptoms 07/13/2010   Controlled type 2 diabetes mellitus with diabetic nephropathy ([place]) 01/11/2010   Hyperlipemia 01/11/2010   Essential hypertension, benign 01/11/2010   NEPHROLITHIASIS, HX OF 01/11/2010    Immunization History  Administered Date(s) Administered   Fluad Quad(high Dose 65+) 05/19/2020   Hepatitis A 06/17/2003, 02/15/2004   Hepatitis B 07/03/2012, 09/04/2012, 12/25/2012   Influenza Split 05/31/2011, 05/27/2012   Influenza Whole 05/17/2009   Influenza,inj,Quad PF,6+ Mos 05/28/2013, 05/26/2014, 06/04/2015, 05/29/2016, 05/17/2017, 05/30/2018, 05/29/2019   Influenza-Unspecified 05/18/2015, 05/31/2021   PFIZER(Purple Top)SARS-COV-2 Vaccination 07/26/2019, 08/16/2019, 05/01/2020   Pneumococcal Conjugate-13 02/13/2014   Pneumococcal Polysaccharide-23 02/14/2001, 02/20/2006, 03/01/2017   Td 11/05/1999, 01/11/2010   Tdap 12/22/2009, 03/25/2016   Zoster, Live 06/28/2005    Conditions to be addressed/monitored:  Hypertension, Hyperlipidemia, Diabetes, Heart Failure, and Coronary Artery Disease  Care Plan : [place]  Updates made by Charlton Haws, [place] since 03/02/2022 12:00 AM     Problem: Hypertension, Hyperlipidemia, Diabetes, Heart Failure, and Coronary Artery Disease      Long-Range Goal: Disease Management   Start Date: 02/21/2021  Expected End Date: 03/03/2023  This Visit's Progress: On track  Priority: High  Note:   Current Barriers:  Does not always adhere to prescribed medication regimen  Pharmacist Clinical Goal(s):  Patient will adhere to prescribed medication regimen as evidenced by pt report through collaboration with PharmD and provider.   Interventions: 1:1 collaboration with [name], MD regarding development and update of comprehensive plan of care as evidenced by provider  attestation and co-signature Inter-disciplinary care team collaboration (see longitudinal plan of care) Comprehensive medication review performed; medication list updated in electronic medical record  Hypertension / Heart Failure (BP goal <140/90) -Controlled - per clinic readings; -Pt has been off carvedilol for several months (last eRx was from 04/2020), he experienced fatigue and dizziness which improved off med ; given EF > 40% beta blocker is not part of GDMT -Last ejection fraction: 45-50% (Date: 05/2020), improved from 35-40% -HF type: Diastolic; NYHA Class: II (slight limitation of activity) -Current home readings: n/a -Denies hypotensive/hypertensive symptoms -Current treatment: Carvedilol 3.125 mg BID - not taking Entresto 24-26 mg BID - Appropriate, Effective, Safe, Accessible Jardiance 10 mg daily -Appropriate, Effective, Safe, Accessible -Medications previously tried: spironolactone -Educated on BP goals and benefits of medications for prevention of heart attack, stroke and kidney damage; -Educated on heart failure - meaning of ejection fracture, role of ECHO to monitor EF pt's EF improved after CABG and Entresto initiation -Reviewed role of beta blocker in HF; he is no longer HFrEF so beta blocker is not necessarily recommended -Recommended to continue current medication; will alert cardiologist that patient is off of carvedilol  Diabetes (A1c goal <8%) -Controlled - A1c 7.6% (01/2022), increased from 6.8%; pt affirms compliance with medications -Current home glucose readings - n/a -Denies hypoglycemic/hyperglycemic symptoms -Current medications: Metformin ER 500 mg BID - Appropriate, Effective, Safe, Accessible Jardiance 10 mg daily -Appropriate, Effective, Safe, Accessible -Medications previously tried: none  -Educated on A1c and blood sugar goals -Recommended to continue current medication; consider increasing Jardiance if A1c increases to > 8%  Hyperlipidemia / CAD   (LDL goal < 70) -Not ideally controlled - LDL 88 (01/2022), increased from 56 (03/2021); pt sometimes stops rosuvastatin for weeks at a  time due to achiness, fatigue -Hx CAD - CABGx3 11/2019 -Current treatment: Rosuvastatin 10 mg daily - Appropriate, Query Effective Aspirin 81 mg daily -Medications previously tried: atorvastatin  -Educated on Cholesterol goals; Benefits of statin/aspirin for ASCVD risk reduction and secondary prevention -Recommended to try rosuvastatin every other day or every 2 days rather than stopping for weeks at atime  Hx iron-deficiency anemia (Goal: maintain HGB at goal) -Controlled - HGB 16.2 (01/2022)  -Current treatment  Ferrous sulfate 325 mg daily -Medications previously tried: iron infusions -Reviewed relationship between iron, hemoglobin and anemia; advised pt he is no longer anemic and can continue taking iron supplement as is for maintenance   Patient Goals/Self-Care Activities Patient will:  - take medications as prescribed as evidenced by patient report and record review focus on medication adherence by routine      Medication Assistance: None required.  Patient affirms current coverage meets needs.  Compliance/Adherence/Medication fill history: Care Gaps: None  Star-Rating Drugs: Jardiance - PDC 100% Metformin - PDC 100% Rosuvastatin - PDC 46% ([facility]) Entresto - PDC 23% (LF 08/18/21 x 90 ds, [facility]) - med was held [place] per cardiology  Medication Access: Within the past 30 days, how often has patient missed a dose of medication? 0 Is a pillbox or other method used to improve adherence? No  Factors that may affect medication adherence? lack of understanding of disease management Are meds synced by current pharmacy? No  Are meds delivered by current pharmacy? No  Does patient experience delays in picking up medications due to transportation concerns? No   [facility] Services Reviewed: Is patient disadvantaged to use [facility]  Pharmacy?: No  Current Rx insurance plan: HTA Name and location of Current pharmacy:  [facility] - [place], [place] [address] Phone: [phone] Fax: [phone]  [id number] [place] [id number]: [phone]: [phone] [place] [id number]: [phone]: [phone] [facility] Pharmacy services reviewed with patient today?: No  Patient requests to transfer care to [facility] Pharmacy?: No  Reason patient declined to change pharmacies: Resistance to change   Care Plan and Follow Up Patient Decision:  Patient agrees to Care Plan and Follow-up.  Plan: Telephone follow up appointment with care management team member scheduled for:  1 year  LCharlene Brooke PharmD, BSoutheasthealth Center Of Reynolds CountyClinical Pharmacist LBiscayne ParkPrimary Care at [facility]Cor[phone]

## 2022-03-02 NOTE — Patient Instructions (Signed)
Visit Information  Phone number for Pharmacist: [phone]   Goals Addressed   None     Care Plan : [place]  Updates made by Charlton Haws, RPH since 03/02/2022 12:00 AM     Problem: Hypertension, Hyperlipidemia, Diabetes, Heart Failure, and Coronary Artery Disease      Long-Range Goal: Disease Management   Start Date: 02/21/2021  Expected End Date: 03/03/2023  This Visit's Progress: On track  Priority: High  Note:   Current Barriers:  Does not always adhere to prescribed medication regimen  Pharmacist Clinical Goal(s):  Patient will adhere to prescribed medication regimen as evidenced by pt report through collaboration with PharmD and provider.   Interventions: 1:1 collaboration with [name], MD regarding development and update of comprehensive plan of care as evidenced by provider attestation and co-signature Inter-disciplinary care team collaboration (see longitudinal plan of care) Comprehensive medication review performed; medication list updated in electronic medical record  Hypertension / Heart Failure (BP goal <140/90) -Controlled - per clinic readings; -Pt has been off carvedilol for several months (last eRx was from 04/2020), he experienced fatigue and dizziness which improved off med ; given EF > 40% beta blocker is not part of GDMT -Last ejection fraction: 45-50% (Date: 05/2020), improved from 35-40% -HF type: Diastolic; NYHA Class: II (slight limitation of activity) -Current home readings: n/a -Denies hypotensive/hypertensive symptoms -Current treatment: Carvedilol 3.125 mg BID - not taking Entresto 24-26 mg BID - Appropriate, Effective, Safe, Accessible Jardiance 10 mg daily -Appropriate, Effective, Safe, Accessible -Medications previously tried: spironolactone -Educated on BP goals and benefits of medications for prevention of heart attack, stroke and kidney damage; -Educated on heart failure - meaning of ejection fracture,  role of ECHO to monitor EF pt's EF improved after CABG and Entresto initiation -Reviewed role of beta blocker in HF; he is no longer HFrEF so beta blocker is not necessarily recommended -Recommended to continue current medication; will alert cardiologist that patient is off of carvedilol  Diabetes (A1c goal <8%) -Controlled - A1c 7.6% (01/2022), increased from 6.8%; pt affirms compliance with medications -Current home glucose readings - n/a -Denies hypoglycemic/hyperglycemic symptoms -Current medications: Metformin ER 500 mg BID - Appropriate, Effective, Safe, Accessible Jardiance 10 mg daily -Appropriate, Effective, Safe, Accessible -Medications previously tried: none  -Educated on A1c and blood sugar goals -Recommended to continue current medication; consider increasing Jardiance if A1c increases to > 8%  Hyperlipidemia / CAD  (LDL goal < 70) -Not ideally controlled - LDL 88 (01/2022), increased from 56 (03/2021); pt sometimes stops rosuvastatin for weeks at a time due to achiness, fatigue -Hx CAD - CABGx3 11/2019 -Current treatment: Rosuvastatin 10 mg daily - Appropriate, Query Effective Aspirin 81 mg daily -Medications previously tried: atorvastatin  -Educated on Cholesterol goals; Benefits of statin/aspirin for ASCVD risk reduction and secondary prevention -Recommended to try rosuvastatin every other day or every 2 days rather than stopping for weeks at atime  Hx iron-deficiency anemia (Goal: maintain HGB at goal) -Controlled - HGB 16.2 (01/2022)  -Current treatment  Ferrous sulfate 325 mg daily -Medications previously tried: iron infusions -Reviewed relationship between iron, hemoglobin and anemia; advised pt he is no longer anemic and can continue taking iron supplement as is for maintenance   Patient Goals/Self-Care Activities Patient will:  - take medications as prescribed as evidenced by patient report and record review focus on medication adherence by routine      Patient  verbalizes understanding of instructions and care plan provided today and agrees  to view in [place]. Active MyChart status and patient understanding of how to access instructions and care plan via MyChart confirmed with patient.    Telephone follow up appointment with pharmacy team member scheduled for: 1 year  Charlene Brooke, PharmD, [facility] Clinical Pharmacist [facility] [phone]

## 2022-03-15 DIAGNOSIS — H18513 Endothelial corneal dystrophy, bilateral: Secondary | ICD-10-CM | POA: Diagnosis not present

## 2022-03-19 IMAGING — CT CT HEAD W/O CM
3 series · 15 of 47 positions shown, 18 images · non-contrast
Comparison: None.

CLINICAL DATA: Status post fall.

EXAM:
CT HEAD WITHOUT CONTRAST
TECHNIQUE: Contiguous axial images were obtained from the base of the skull
through the vertex without intravenous contrast.

[Series 2: head wo · axial · 0.47mm/px · z∈[-128,+2]mm · 9 of 32 slices shown, 12 images]
[im 3/32  brain]
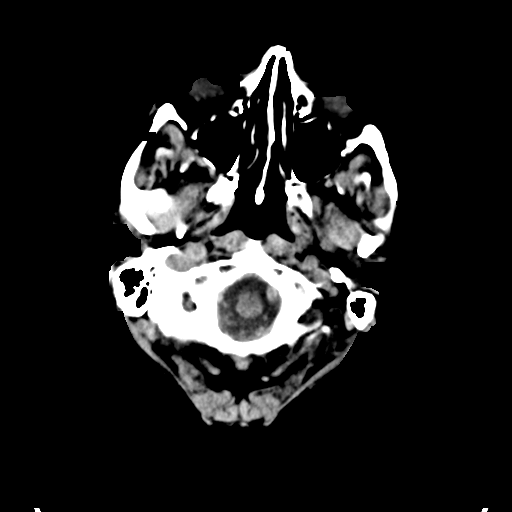
[im 3/32  bone]
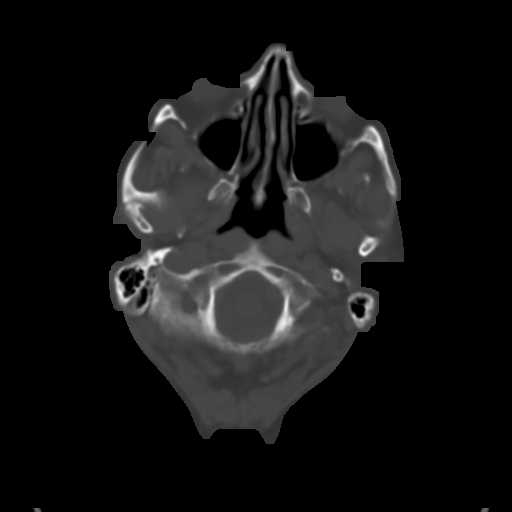
[im 6/32  brain]
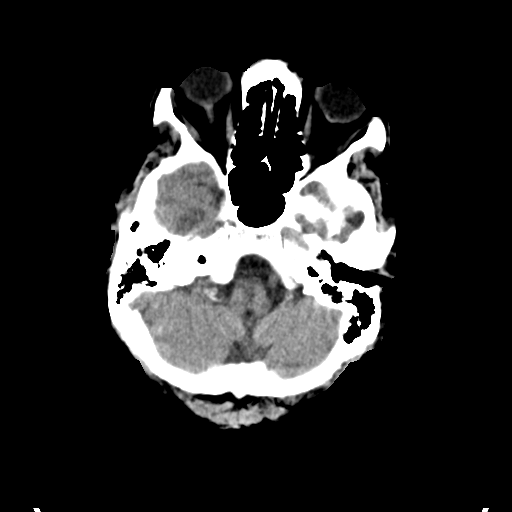
[im 9/32  brain]
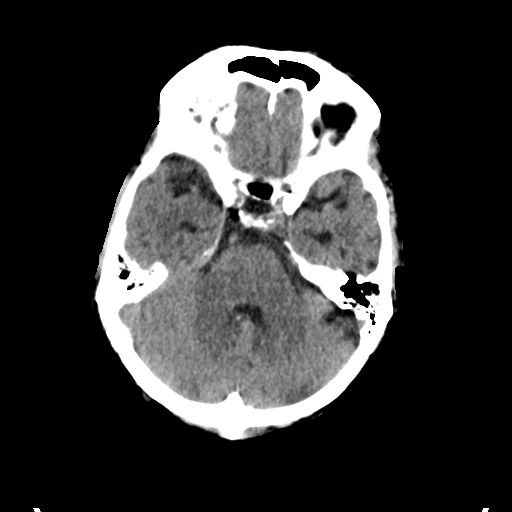
[im 12/32  brain]
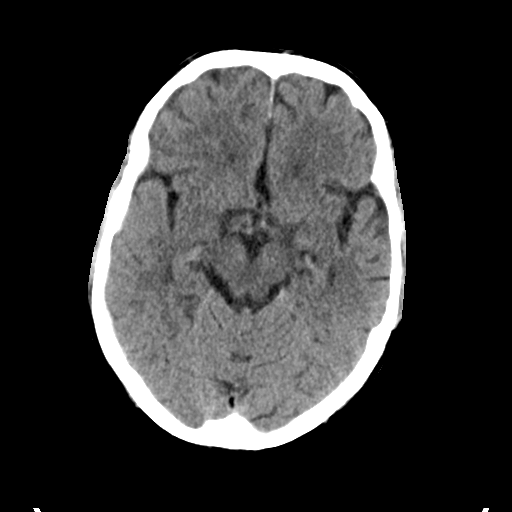
[im 17/32  brain]
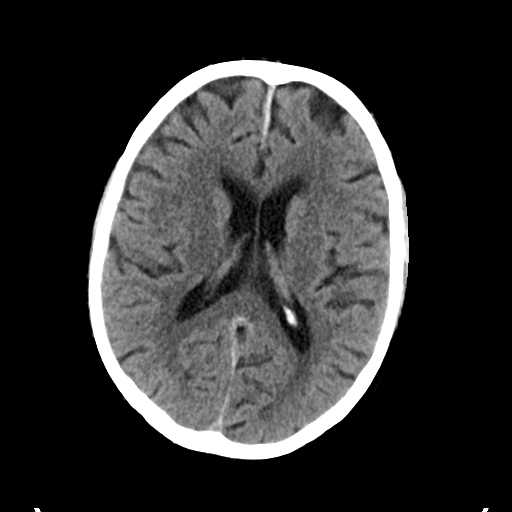
[im 17/32  bone]
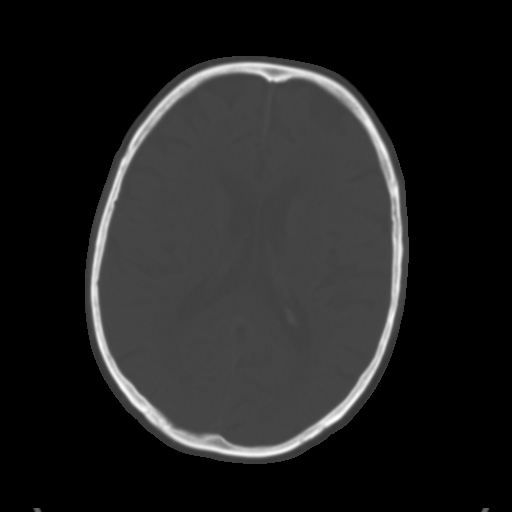
[im 20/32  brain]
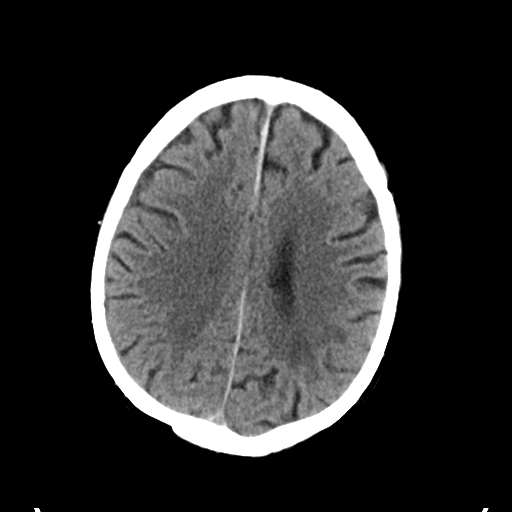
[im 23/32  brain]
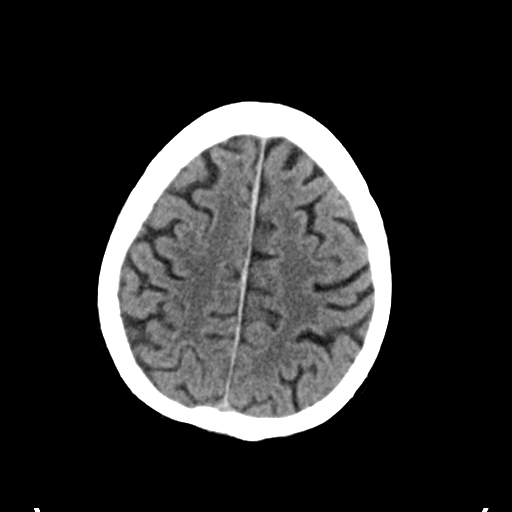
[im 26/32  brain]
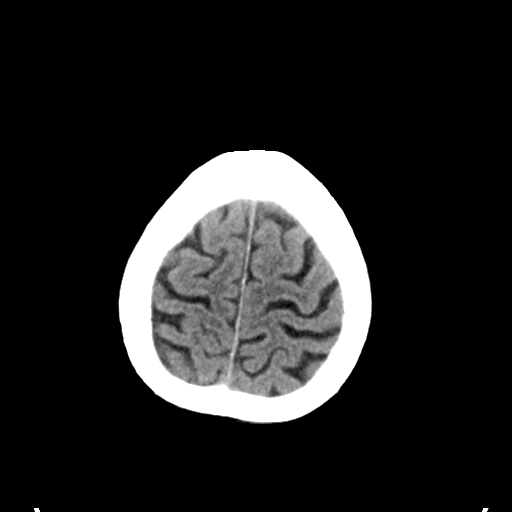
[im 29/32  brain]
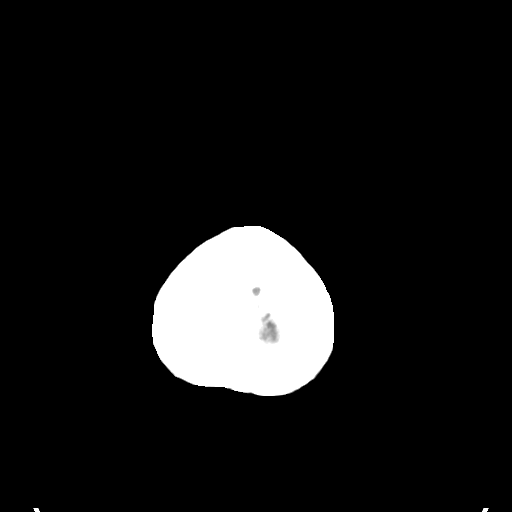
[im 29/32  bone]
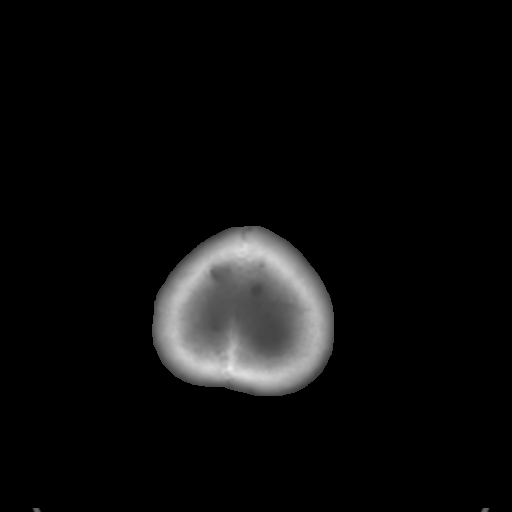

[Series 4: coronal soft tissue · coronal · 0.32mm/px · 3 of 71 slices shown]
[im 24/71  brain]
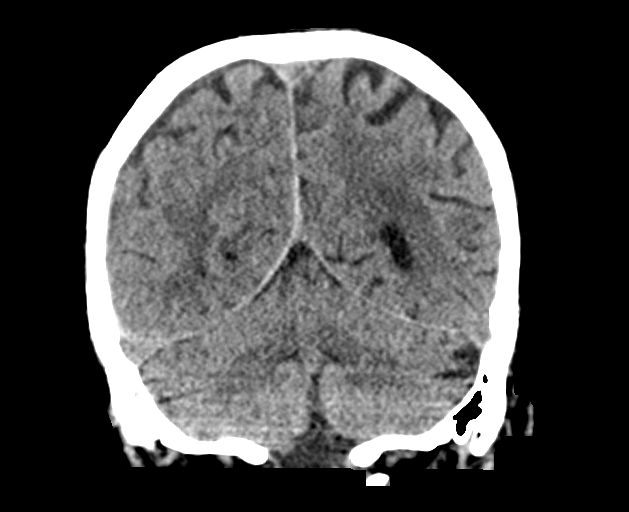
[im 32/71  brain]
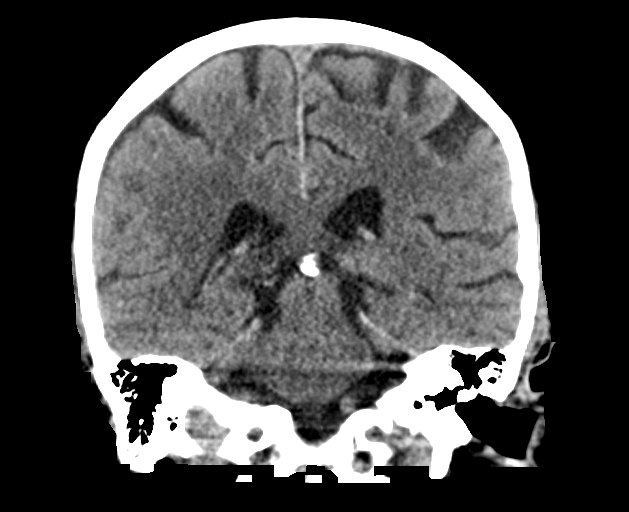
[im 39/71  brain]
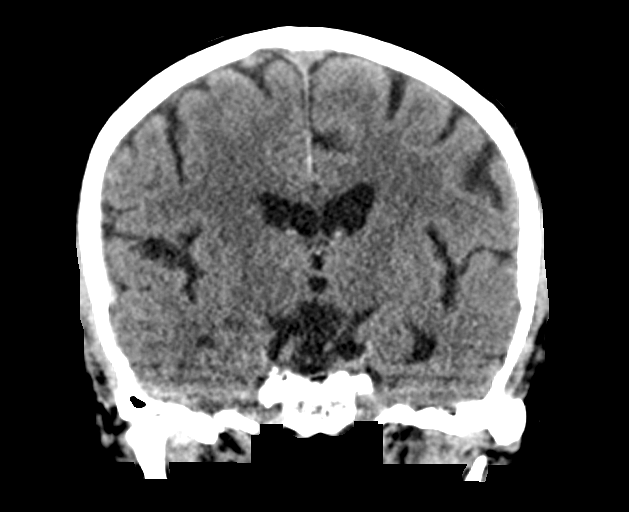

[Series 5: sagittal soft tissue · sagittal · 0.32mm/px · 3 of 67 slices shown]
[im 23/67  brain]
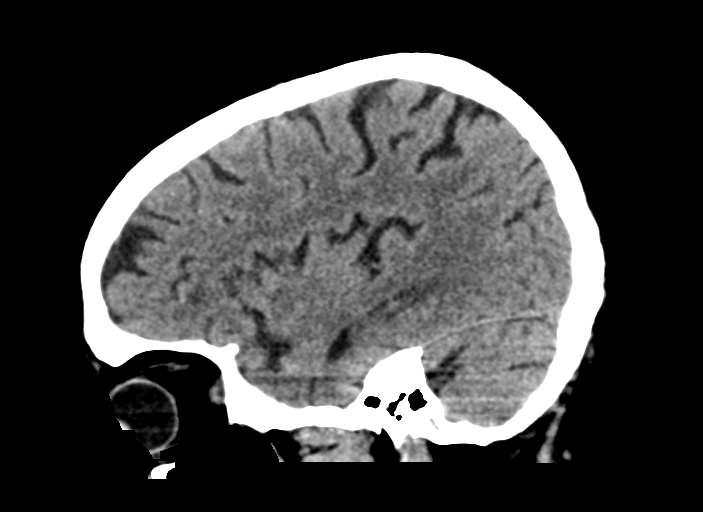
[im 34/67  brain]
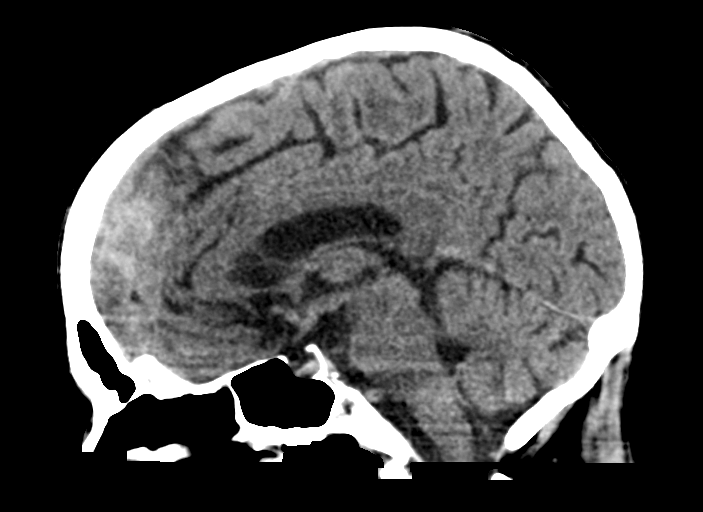
[im 45/67  brain]
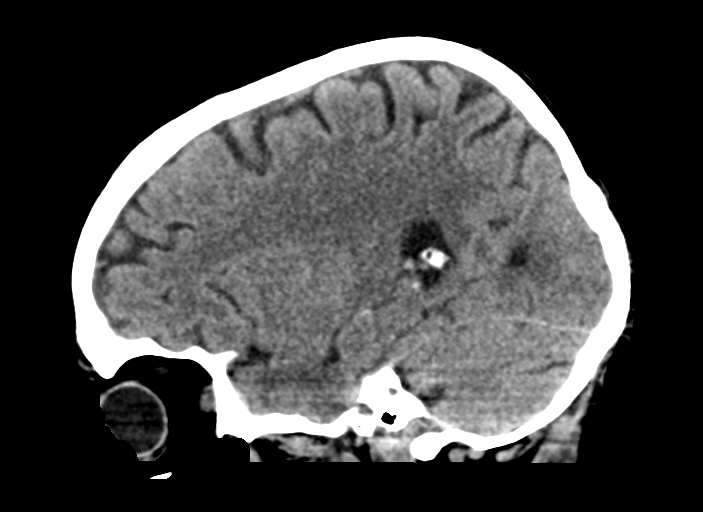

[15 of 47 positions shown; findings below may reference images not displayed]

FINDINGS: Brain: There is mild cerebral atrophy with widening of the
extra-axial spaces and ventricular dilatation.
There are areas of decreased attenuation within the white matter
tracts of the supratentorial brain, consistent with microvascular
disease changes.

Vascular: No hyperdense vessel or unexpected calcification.

Skull: Normal. Negative for fracture or focal lesion.

Sinuses/Orbits: No acute finding.

Other: None.
IMPRESSION: 1. Generalized cerebral atrophy.
2. No acute intracranial abnormality.

## 2022-03-27 ENCOUNTER — Ambulatory Visit: Payer: PPO | Admitting: Cardiology

## 2022-03-28 ENCOUNTER — Ambulatory Visit (INDEPENDENT_AMBULATORY_CARE_PROVIDER_SITE_OTHER): Payer: PPO | Admitting: Internal Medicine

## 2022-03-28 ENCOUNTER — Encounter: Payer: Self-pay | Admitting: Internal Medicine

## 2022-03-28 DIAGNOSIS — M791 Myalgia, unspecified site: Secondary | ICD-10-CM | POA: Diagnosis not present

## 2022-03-28 NOTE — Assessment & Plan Note (Signed)
Seems to be improving I suspect he had some exposure (bite or minimal infection) for this Urged him to wait for now since he is improving Okay to use diclofenac gel prn

## 2022-03-28 NOTE — Progress Notes (Signed)
Subjective:    Patient ID: James Braun, male    DOB: September 14, 1940, 81 y.o.   MRN: [id number]  HPI Here with several concerns  Started with pain in joints about 3 weeks ago Seems to be worst in evening and night--has needed ibuprofen '400mg'$  to sleep Stopped exercise at first--but now back to walking and it seems to loosen up with walking Did finally sleep better last night  No recent infection Doesn't remember any tick or insect bites  Bowels now better ---using 2 stool softeners bid Taking magnesium bid also Occasional dulcolax if no BM in a few days  Current Outpatient Medications on File Prior to Visit  Medication Sig Dispense Refill   alum hydroxide-mag trisilicate (GAVISCON) 32-95 MG CHEW chewable tablet Chew 2 tablets by mouth 3 (three) times daily as needed for indigestion or heartburn.     aspirin EC 81 MG tablet Take 1 tablet (81 mg total) by mouth daily. Swallow whole. 90 tablet 3   Cyanocobalamin (VITAMIN B-12) 1000 MCG SUBL Place 1,000 mcg under the tongue. 1000 mcg SL daily     empagliflozin (JARDIANCE) 10 MG TABS tablet Take 1 tablet (10 mg total) by mouth daily before breakfast. 90 tablet 3   ferrous sulfate 325 (65 FE) MG tablet Take 325 mg by mouth daily with breakfast.     finasteride (PROSCAR) 5 MG tablet Take 1 tablet (5 mg total) by mouth daily. 90 tablet 3   LUMIGAN 0.01 % SOLN Place 1 drop into the left eye daily.     metFORMIN (GLUCOPHAGE-XR) 500 MG 24 hr tablet Take 1 tablet (500 mg total) by mouth 2 (two) times daily. 180 tablet 3   Multiple Vitamin (MULTIVITAMIN) tablet Take 1 tablet by mouth daily.     omeprazole (PRILOSEC) 20 MG capsule TAKE 1 CAPSULE BY MOUTH EVERY DAY 90 capsule 3   Potassium Citrate 15 MEQ (1620 MG) TBCR Take 1 tablet by mouth 3 (three) times daily. 270 tablet 3   rosuvastatin (CRESTOR) 10 MG tablet TAKE 1 TABLET BY MOUTH EVERY DAY 90 tablet 1   sacubitril-valsartan (ENTRESTO) 24-26 MG Take 1 tablet by mouth 2 (two) times daily. 60  tablet 2   sildenafil (REVATIO) 20 MG tablet TAKE 3-5 TABLETS BY MOUTH EVERY DAY AS NEEDED 50 tablet 5   SIMBRINZA 1-0.2 % SUSP Place 1 drop into the left eye 2 (two) times daily.     timolol (TIMOPTIC) 0.5 % ophthalmic solution Place 1 drop into both eyes 2 (two) times daily.      Vitamin D3 (VITAMIN D) 25 MCG tablet Take 1 tablet by mouth 2 (two) times daily.     No current facility-administered medications on file prior to visit.    Allergies  Allergen Reactions   Lipitor [Atorvastatin] Other (See Comments)    myalgias   Lisinopril    Morphine And Related Other (See Comments)     hypotension   Levofloxacin Rash   Septra [Sulfamethoxazole-Trimethoprim] Rash   Sulfamethoxazole-Trimethoprim Rash    Past Medical History:  Diagnosis Date   BPH (benign prostatic hypertrophy)    Cancer (HCC)    melanoma   Coronary artery disease    History of kidney stones    History of melanoma excision OCT 2010   Hyperlipidemia    Hypertension    Left ureteral calculus    Nephrolithiasis    bilateral   Type 2 diabetes mellitus ([place])     Past Surgical History:  Procedure Laterality Date  CARDIAC CATHETERIZATION     CHOLECYSTECTOMY  july 2000   CORONARY ARTERY BYPASS GRAFT N/A 11/28/2019   Procedure: CORONARY ARTERY BYPASS GRAFTING (CABG) x3, using left internal mammary artery and Saphenous vein harvested endoscopically;  Surgeon: [name], MD;  Location: [place];  Service: Open Heart Surgery;  Laterality: N/A;   CYSTOSCOPY WITH STENT PLACEMENT Left 11/14/2012   Procedure: CYSTOSCOPY WITH STENT PLACEMENT;  Surgeon: [name], MD;  Location: [facility];  Service: Urology;  Laterality: Left;   CYSTOSCOPY/RETROGRADE/URETEROSCOPY/STONE EXTRACTION WITH BASKET Left 11/14/2012   Procedure: CYSTOSCOPY/RETROGRADE/URETEROSCOPY/STONE EXTRACTION WITH BASKET;  Surgeon: [name], MD;  [facility];  Service: Urology;  Laterality: Left;    EXTRACORPOREAL SHOCK WAVE LITHOTRIPSY  JULY 2000   X2   HOLMIUM LASER APPLICATION Left 09/19/5730   Procedure: HOLMIUM LASER APPLICATION;  Surgeon: [name], MD;  Location: [facility];  Service: Urology;  Laterality: Left;   LEFT HEART CATH AND CORONARY ANGIOGRAPHY N/A 11/27/2019   Procedure: LEFT HEART CATH AND CORONARY ANGIOGRAPHY;  Surgeon: [name], MD;  Location: [place] [facility];  Service: Cardiovascular;  Laterality: N/A;   LEFT URETEROSCOPIC STONE EXTRACTION  02-22-2007   NEPHROLITHOTOMY Right 09-28-2010   percutaneous   ORIF CLAVICLE FRACTURE  2006   PROSTATE BIOPSY  1998   neg   SHOULDER ARTHROSCOPY  1/15   Dr [name]   TEE WITHOUT CARDIOVERSION N/A 11/28/2019   Procedure: TRANSESOPHAGEAL ECHOCARDIOGRAM (TEE);  Surgeon: [name], Collier Salina, MD;  Location: [place];  Service: Open Heart Surgery;  Laterality: N/A;   TONSILLECTOMY      Family History  Problem Relation Age of Onset   Hypertension Mother    Diabetes Father    Hypertension Father    Colon polyps Brother    Coronary artery disease Neg Hx    Cancer Neg Hx    Esophageal cancer Neg Hx    Stomach cancer Neg Hx    Rectal cancer Neg Hx     Social History   Socioeconomic History   Marital status: Married    Spouse name: Not on file   Number of children: 2   Years of education: Not on file   Highest education level: Not on file  Occupational History   Occupation: Research officer, 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   Occupation: Buys very distressed houses and rehabilitates   Occupation: Does mediation  Tobacco Use   Smoking status: Never    Passive exposure: Never   Smokeless tobacco: Never  Substance and Sexual Activity   Alcohol use: No   Drug use: No   Sexual activity: Not on file  Other Topics Concern   Not on file  Social History Narrative   Has living will   Wife is health care POA---alternate is committee of physicians   Would accept resuscitation---wouldn't accept prolonged ventilation    No tube feeds if cognitively unaware   Right handed   Social Determinants of Health   Financial Resource Strain: Low Risk  (02/21/2021)   Overall Financial Resource Strain (CARDIA)    Difficulty of Paying Living Expenses: Not hard at all  Food Insecurity: Not on file  Transportation Needs: Not on file  Physical Activity: Not on file  Stress: Not on file  Social Connections: Not on file  Intimate Partner Violence: Not on file   Review of Systems Is planning a corneal transplant soon--wonders about whether to hold the jardiance with the surgery (in the prescribing information)----told him to ask his  eye surgeon and follow their directions    Objective:   Physical Exam Constitutional:      Appearance: Normal appearance.  Musculoskeletal:     Comments: No active joint swelling ROM fine in joints  Neurological:     Mental Status: He is alert.            Assessment & Plan:

## 2022-03-28 NOTE — Patient Instructions (Addendum)
I would recommend senna-s (senna with the stool softener) instead of the stool softener alone--- 2 tabs once or twice a day. You can try over the counter diclofenac gel on any painful joints.

## 2022-04-01 IMAGING — DX DG CHEST 2V
2 series · 2 of 2 positions shown · non-contrast
Comparison: 02/06/2020

CLINICAL DATA: Fall, left chest pain, evaluate for fracture

EXAM:
CHEST - 2 VIEW

[dg chest 2 view (1 of 2)]
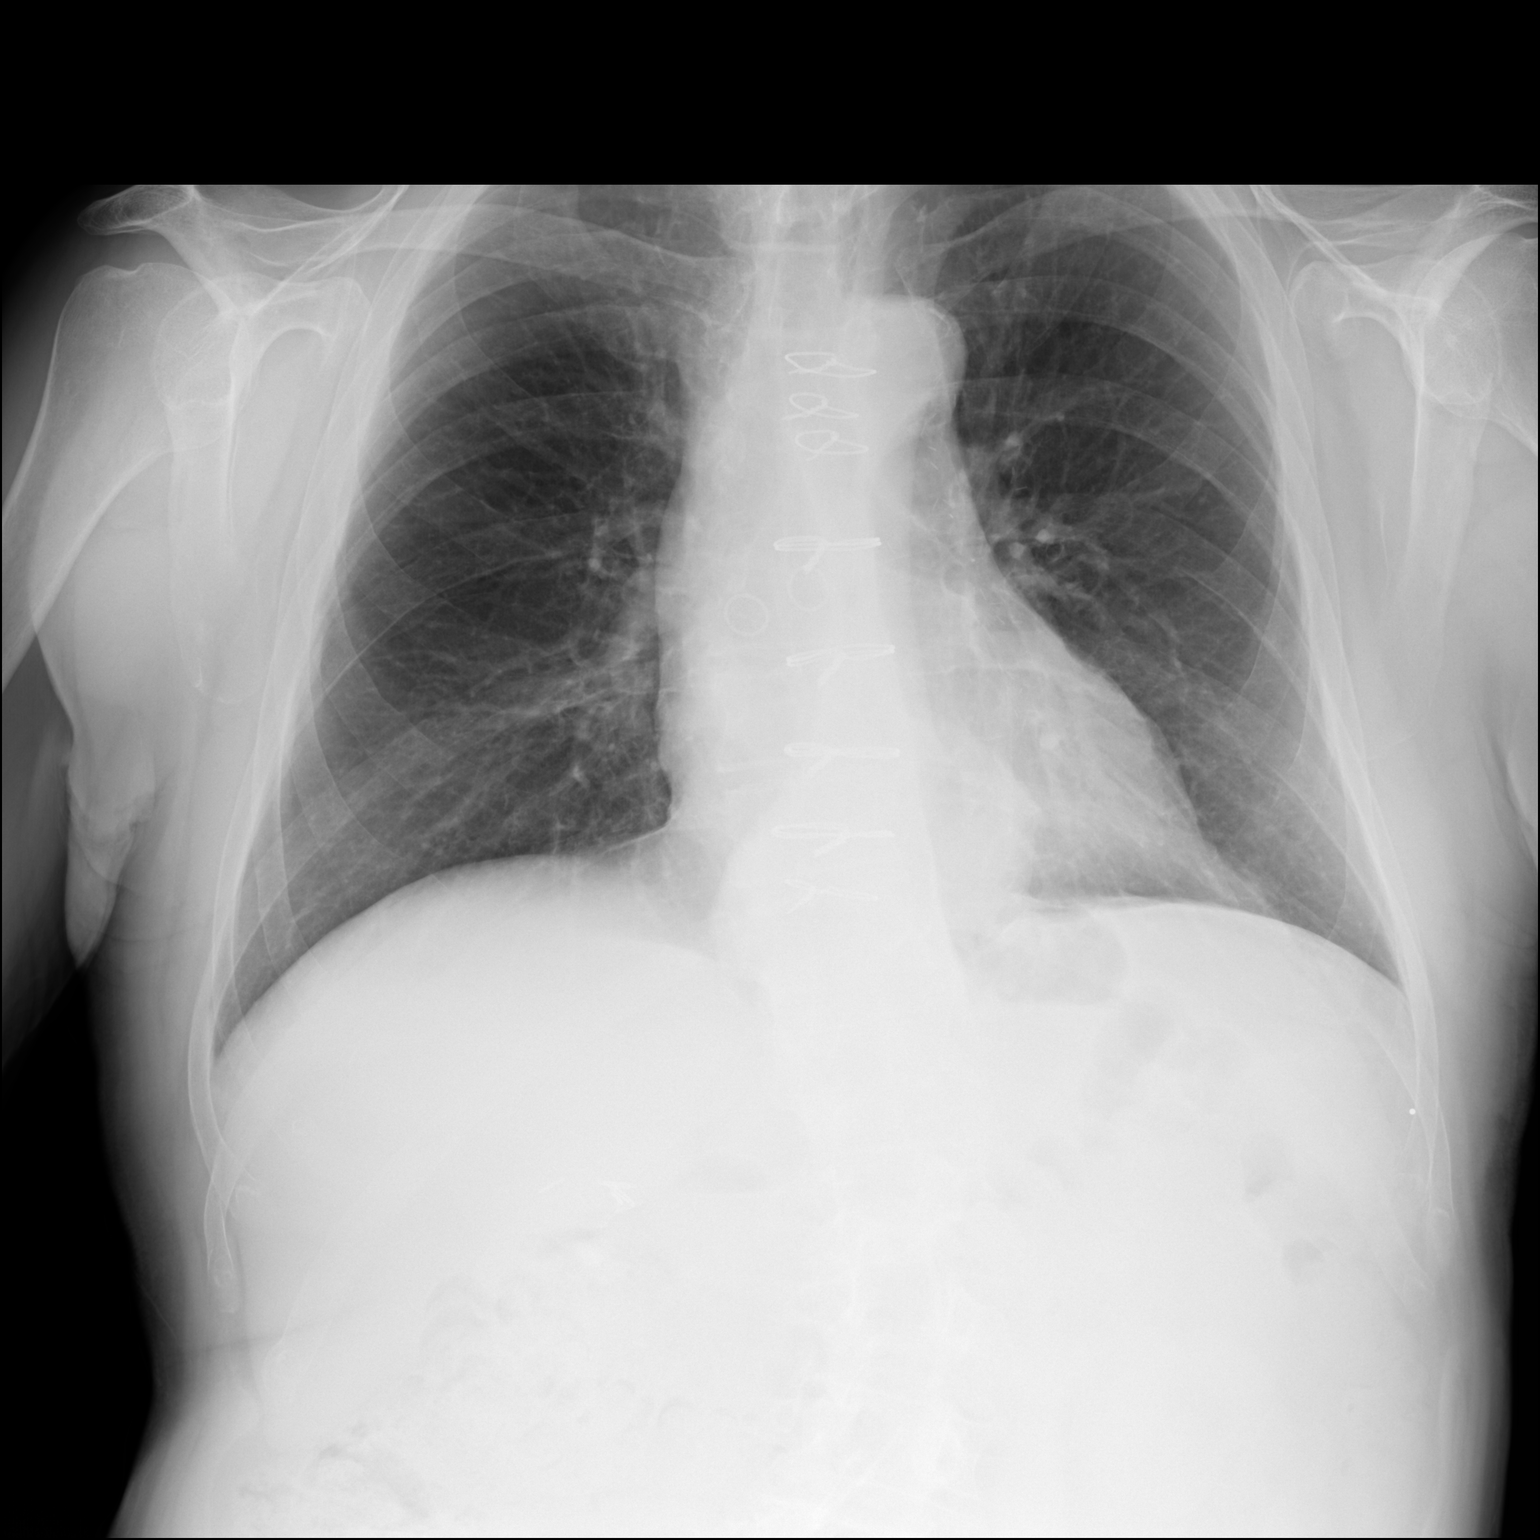

[dg chest 2 view (2 of 2)]
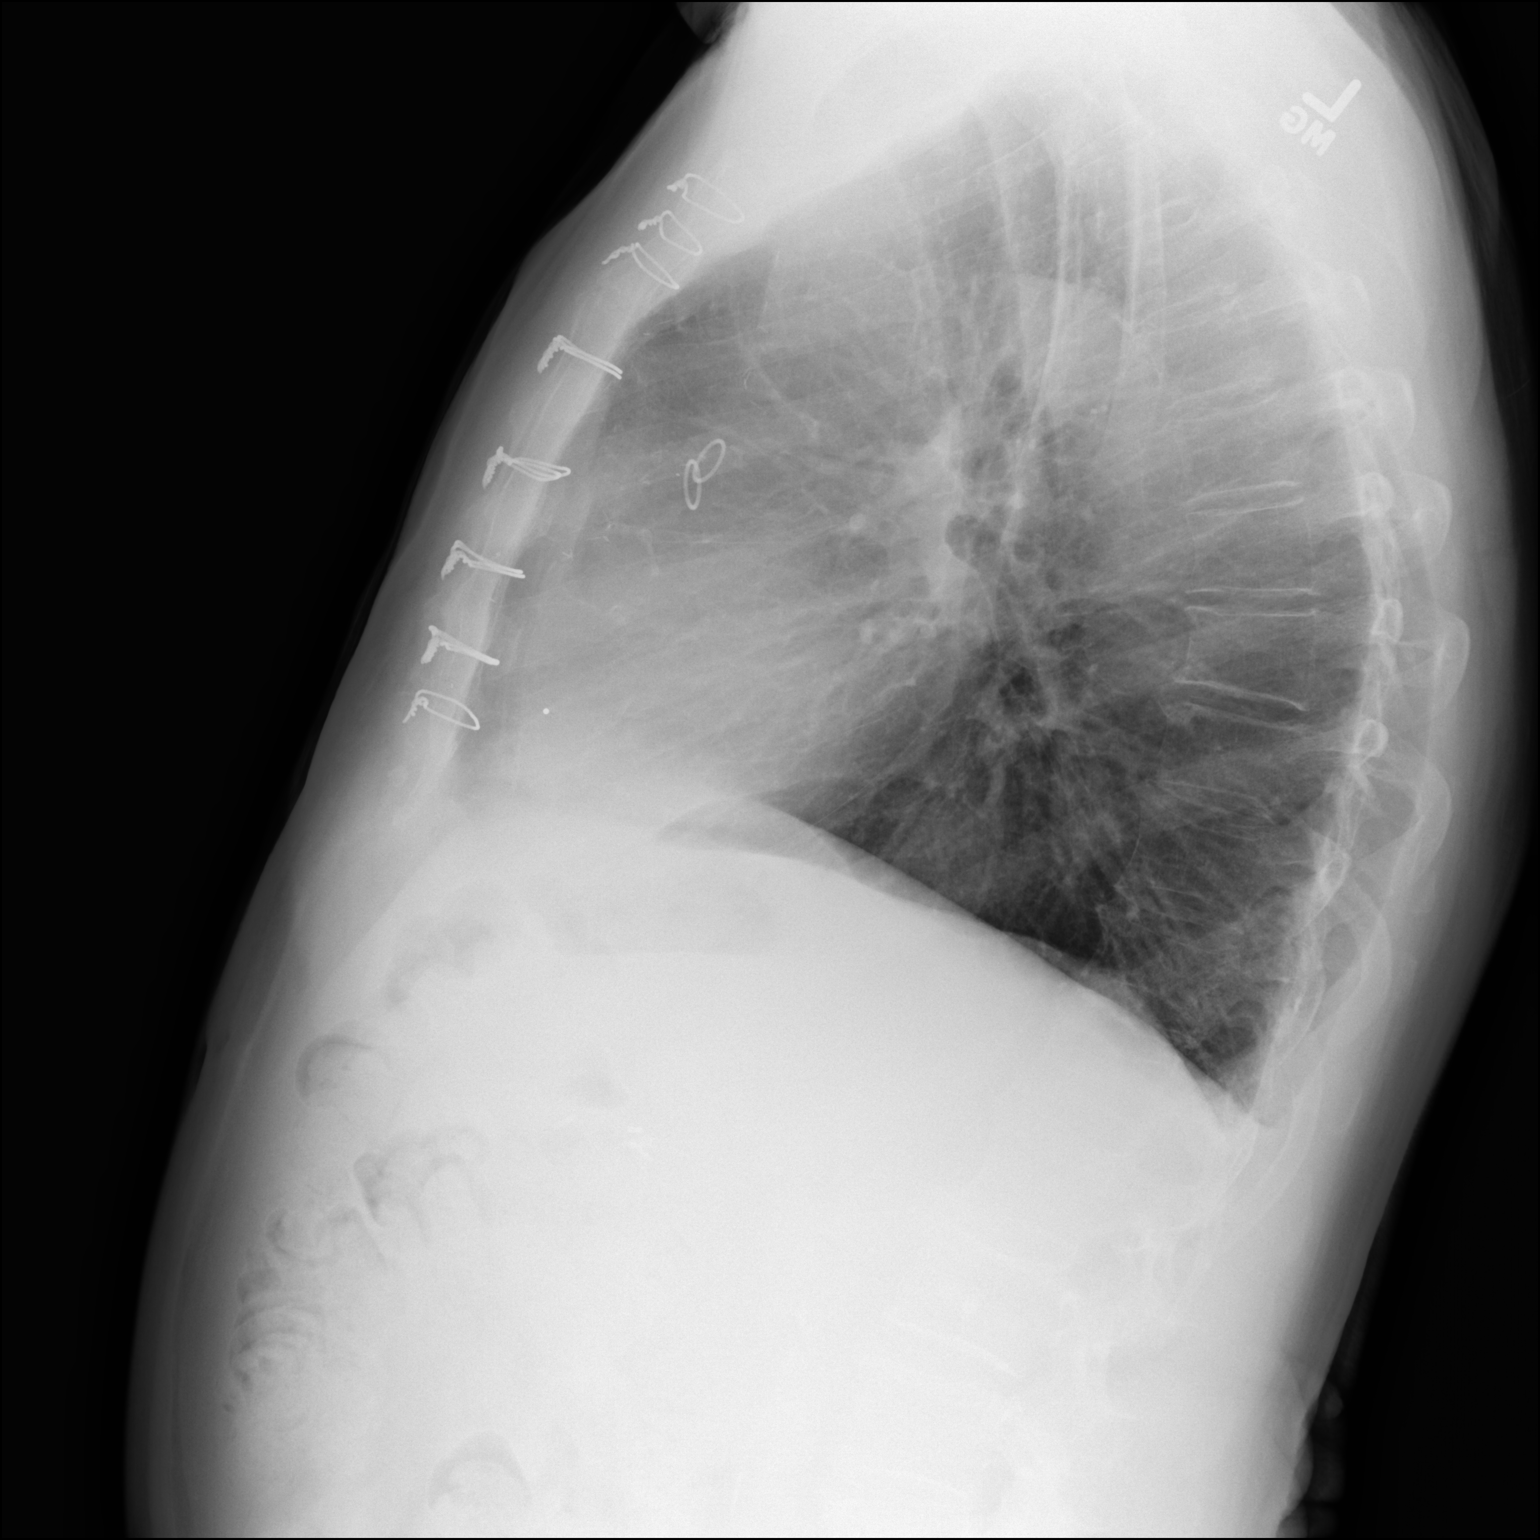

[2 of 2 positions shown; findings below may reference images not displayed]

FINDINGS: Lungs are clear.  No pleural effusion or pneumothorax.

The heart is normal in size. Postsurgical changes related to prior
CABG.

Median sternotomy.

No displaced left rib fracture is seen.
IMPRESSION: Normal chest radiographs.

No displaced left rib fracture is seen.

## 2022-04-04 ENCOUNTER — Encounter: Payer: Self-pay | Admitting: Internal Medicine

## 2022-04-10 ENCOUNTER — Ambulatory Visit: Payer: PPO | Admitting: Podiatry

## 2022-04-10 ENCOUNTER — Encounter: Payer: Self-pay | Admitting: Podiatry

## 2022-04-10 DIAGNOSIS — B351 Tinea unguium: Secondary | ICD-10-CM

## 2022-04-10 DIAGNOSIS — E1142 Type 2 diabetes mellitus with diabetic polyneuropathy: Secondary | ICD-10-CM

## 2022-04-10 DIAGNOSIS — M79676 Pain in unspecified toe(s): Secondary | ICD-10-CM | POA: Diagnosis not present

## 2022-04-10 DIAGNOSIS — Q828 Other specified congenital malformations of skin: Secondary | ICD-10-CM | POA: Diagnosis not present

## 2022-04-10 DIAGNOSIS — N1831 Chronic kidney disease, stage 3a: Secondary | ICD-10-CM

## 2022-04-10 NOTE — Progress Notes (Signed)
This patient returns to my office for at risk foot care.  This patient requires this care by a professional since this patient will be at risk due to having  Diabetes and CKD.    This patient is unable to cut nails himself since the patient cannot reach his nails.These nails are painful walking and wearing shoes.  This patient presents for at risk foot care today.  General Appearance  Alert, conversant and in no acute stress.  Vascular  Dorsalis pedis and posterior tibial  pulses are palpable  bilaterally.  Capillary return is within normal limits  bilaterally. Temperature is within normal limits  bilaterally.  Neurologic  Senn-Weinstein monofilament wire test diminished  bilaterally. Muscle power within normal limits bilaterally.  Nails Thick disfigured discolored nails with subungual debris  from hallux to fifth toes bilaterally. No evidence of bacterial infection or drainage bilaterally.  Orthopedic  No limitations of motion  feet .  No crepitus or effusions noted.  No bony pathology or digital deformities noted.  Skin  normotropic skin with no porokeratosis noted bilaterally.  No signs of infections or ulcers noted.   Callus sub 5th  B/L.  Callus left heel.  Onychomycosis  Pain in right toes  Pain in left toes  Porokeratosis  B/L.  Consent was obtained for treatment procedures.   Mechanical debridement of nails 1-5  bilaterally performed with a nail nipper.  Filed with dremel without incident. No infection or ulcer.  Debride callus with # 15 blade and dremel tool.   Return office visit  10 weeks                Told patient to return for periodic foot care and evaluation due to potential at risk complications.   Gardiner Barefoot DPM

## 2022-04-20 ENCOUNTER — Ambulatory Visit: Payer: PPO | Admitting: Podiatry

## 2022-04-24 ENCOUNTER — Ambulatory Visit: Payer: PPO | Admitting: Podiatry

## 2022-05-23 DIAGNOSIS — N400 Enlarged prostate without lower urinary tract symptoms: Secondary | ICD-10-CM | POA: Diagnosis not present

## 2022-05-23 DIAGNOSIS — E785 Hyperlipidemia, unspecified: Secondary | ICD-10-CM | POA: Diagnosis not present

## 2022-05-23 DIAGNOSIS — I2510 Atherosclerotic heart disease of native coronary artery without angina pectoris: Secondary | ICD-10-CM | POA: Diagnosis not present

## 2022-05-23 DIAGNOSIS — E1122 Type 2 diabetes mellitus with diabetic chronic kidney disease: Secondary | ICD-10-CM | POA: Diagnosis not present

## 2022-05-23 DIAGNOSIS — Z85820 Personal history of malignant melanoma of skin: Secondary | ICD-10-CM | POA: Diagnosis not present

## 2022-05-23 DIAGNOSIS — I480 Paroxysmal atrial fibrillation: Secondary | ICD-10-CM | POA: Diagnosis not present

## 2022-05-23 DIAGNOSIS — Z951 Presence of aortocoronary bypass graft: Secondary | ICD-10-CM | POA: Diagnosis not present

## 2022-05-23 DIAGNOSIS — I5020 Unspecified systolic (congestive) heart failure: Secondary | ICD-10-CM | POA: Diagnosis not present

## 2022-05-23 DIAGNOSIS — H18519 Endothelial corneal dystrophy, unspecified eye: Secondary | ICD-10-CM | POA: Diagnosis not present

## 2022-05-23 DIAGNOSIS — I130 Hypertensive heart and chronic kidney disease with heart failure and stage 1 through stage 4 chronic kidney disease, or unspecified chronic kidney disease: Secondary | ICD-10-CM | POA: Diagnosis not present

## 2022-05-23 DIAGNOSIS — H18511 Endothelial corneal dystrophy, right eye: Secondary | ICD-10-CM | POA: Diagnosis not present

## 2022-05-23 DIAGNOSIS — N183 Chronic kidney disease, stage 3 unspecified: Secondary | ICD-10-CM | POA: Diagnosis not present

## 2022-05-23 DIAGNOSIS — K219 Gastro-esophageal reflux disease without esophagitis: Secondary | ICD-10-CM | POA: Diagnosis not present

## 2022-05-23 DIAGNOSIS — M159 Polyosteoarthritis, unspecified: Secondary | ICD-10-CM | POA: Diagnosis not present

## 2022-05-23 DIAGNOSIS — D631 Anemia in chronic kidney disease: Secondary | ICD-10-CM | POA: Diagnosis not present

## 2022-05-23 DIAGNOSIS — Z7982 Long term (current) use of aspirin: Secondary | ICD-10-CM | POA: Diagnosis not present

## 2022-05-23 DIAGNOSIS — Z87891 Personal history of nicotine dependence: Secondary | ICD-10-CM | POA: Diagnosis not present

## 2022-05-23 DIAGNOSIS — Z79899 Other long term (current) drug therapy: Secondary | ICD-10-CM | POA: Diagnosis not present

## 2022-05-23 DIAGNOSIS — Z7984 Long term (current) use of oral hypoglycemic drugs: Secondary | ICD-10-CM | POA: Diagnosis not present

## 2022-06-01 ENCOUNTER — Ambulatory Visit (INDEPENDENT_AMBULATORY_CARE_PROVIDER_SITE_OTHER): Payer: PPO | Admitting: Internal Medicine

## 2022-06-01 ENCOUNTER — Ambulatory Visit: Payer: PPO | Admitting: Internal Medicine

## 2022-06-01 ENCOUNTER — Encounter: Payer: Self-pay | Admitting: Internal Medicine

## 2022-06-01 VITALS — BP 130/80 | HR 87 | Temp 98.0°F | Ht 65.5 in | Wt 148.0 lb

## 2022-06-01 DIAGNOSIS — M791 Myalgia, unspecified site: Secondary | ICD-10-CM | POA: Diagnosis not present

## 2022-06-01 NOTE — Progress Notes (Signed)
Subjective:    Patient ID: James Braun, male    DOB: Dec 05, 1940, 81 y.o.   MRN: [id number]  HPI Here due to muscle pain  Feels achy "all over" Has to struggle to get out of a chair at times Has tried tylenol---may help joints  Even went back to glucosamine/chondroitin  and curamin  He now thinks this is muscular Has pain in buttocks now, etc--doesn't seem to be joints  Has been on the crestor three days a week---for a year or so  Current Outpatient Medications on File Prior to Visit  Medication Sig Dispense Refill   alum hydroxide-mag trisilicate (GAVISCON) 84-16 MG CHEW chewable tablet Chew 2 tablets by mouth 3 (three) times daily as needed for indigestion or heartburn.     aspirin EC 81 MG tablet Take 1 tablet (81 mg total) by mouth daily. Swallow whole. 90 tablet 3   Cyanocobalamin (VITAMIN B-12) 1000 MCG SUBL Place 1,000 mcg under the tongue. 1000 mcg SL daily     empagliflozin (JARDIANCE) 10 MG TABS tablet Take 1 tablet (10 mg total) by mouth daily before breakfast. 90 tablet 3   ferrous sulfate 325 (65 FE) MG tablet Take 325 mg by mouth daily with breakfast.     finasteride (PROSCAR) 5 MG tablet Take 1 tablet (5 mg total) by mouth daily. 90 tablet 3   LUMIGAN 0.01 % SOLN Place 1 drop into the left eye daily.     metFORMIN (GLUCOPHAGE-XR) 500 MG 24 hr tablet Take 1 tablet (500 mg total) by mouth 2 (two) times daily. 180 tablet 3   Multiple Vitamin (MULTIVITAMIN) tablet Take 1 tablet by mouth daily.     omeprazole (PRILOSEC) 20 MG capsule TAKE 1 CAPSULE BY MOUTH EVERY DAY 90 capsule 3   Potassium Citrate 15 MEQ (1620 MG) TBCR Take 1 tablet by mouth 3 (three) times daily. 270 tablet 3   rosuvastatin (CRESTOR) 10 MG tablet TAKE 1 TABLET BY MOUTH EVERY DAY (Patient taking differently: Take 10 mg by mouth. 3 times weekly.) 90 tablet 1   sacubitril-valsartan (ENTRESTO) 24-26 MG Take 1 tablet by mouth 2 (two) times daily. 60 tablet 2   sildenafil (REVATIO) 20 MG tablet TAKE 3-5  TABLETS BY MOUTH EVERY DAY AS NEEDED 50 tablet 5   SIMBRINZA 1-0.2 % SUSP Place 1 drop into the left eye 2 (two) times daily.     timolol (TIMOPTIC) 0.5 % ophthalmic solution Place 1 drop into both eyes 2 (two) times daily.      Vitamin D3 (VITAMIN D) 25 MCG tablet Take 1 tablet by mouth 2 (two) times daily.     No current facility-administered medications on file prior to visit.    Allergies  Allergen Reactions   Lipitor [Atorvastatin] Other (See Comments)    myalgias   Lisinopril    Morphine And Related Other (See Comments)     hypotension   Levofloxacin Rash   Septra [Sulfamethoxazole-Trimethoprim] Rash   Sulfamethoxazole-Trimethoprim Rash    Past Medical History:  Diagnosis Date   BPH (benign prostatic hypertrophy)    Cancer (HCC)    melanoma   Coronary artery disease    History of kidney stones    History of melanoma excision OCT 2010   Hyperlipidemia    Hypertension    Left ureteral calculus    Nephrolithiasis    bilateral   Type 2 diabetes mellitus ([place])     Past Surgical History:  Procedure Laterality Date   CARDIAC CATHETERIZATION  CHOLECYSTECTOMY  july 2000   CORONARY ARTERY BYPASS GRAFT N/A 11/28/2019   Procedure: CORONARY ARTERY BYPASS GRAFTING (CABG) x3, using left internal mammary artery and Saphenous vein harvested endoscopically;  Surgeon: [name], MD;  Location: [place];  Service: Open Heart Surgery;  Laterality: N/A;   CYSTOSCOPY WITH STENT PLACEMENT Left 11/14/2012   Procedure: CYSTOSCOPY WITH STENT PLACEMENT;  Surgeon: [name], MD;  Location: [facility];  Service: Urology;  Laterality: Left;   CYSTOSCOPY/RETROGRADE/URETEROSCOPY/STONE EXTRACTION WITH BASKET Left 11/14/2012   Procedure: CYSTOSCOPY/RETROGRADE/URETEROSCOPY/STONE EXTRACTION WITH BASKET;  Surgeon: [name], MD;  Location: [facility];  Service: Urology;  Laterality: Left;   EXTRACORPOREAL SHOCK WAVE LITHOTRIPSY  JULY 2000   X2    HOLMIUM LASER APPLICATION Left 10/16/6832   Procedure: HOLMIUM LASER APPLICATION;  Surgeon: [name], MD;  Location: [facility];  Service: Urology;  Laterality: Left;   LEFT HEART CATH AND CORONARY ANGIOGRAPHY N/A 11/27/2019   Procedure: LEFT HEART CATH AND CORONARY ANGIOGRAPHY;  Surgeon: [name], MD;  Location: [place] [facility];  Service: Cardiovascular;  Laterality: N/A;   LEFT URETEROSCOPIC STONE EXTRACTION  02-22-2007   NEPHROLITHOTOMY Right 09-28-2010   percutaneous   ORIF CLAVICLE FRACTURE  2006   PROSTATE BIOPSY  1998   neg   SHOULDER ARTHROSCOPY  1/15   Dr [name]   TEE WITHOUT CARDIOVERSION N/A 11/28/2019   Procedure: TRANSESOPHAGEAL ECHOCARDIOGRAM (TEE);  Surgeon: [name], Collier Salina, MD;  Location: [place];  Service: Open Heart Surgery;  Laterality: N/A;   TONSILLECTOMY      Family History  Problem Relation Age of Onset   Hypertension Mother    Diabetes Father    Hypertension Father    Colon polyps Brother    Coronary artery disease Neg Hx    Cancer Neg Hx    Esophageal cancer Neg Hx    Stomach cancer Neg Hx    Rectal cancer Neg Hx     Social History   Socioeconomic History   Marital status: Married    Spouse name: Not on file   Number of children: 2   Years of education: Not on file   Highest education level: Not on file  Occupational History   Occupation: Research officer, 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   Occupation: Buys very distressed houses and rehabilitates   Occupation: Does mediation  Tobacco Use   Smoking status: Never    Passive exposure: Never   Smokeless tobacco: Never  Substance and Sexual Activity   Alcohol use: No   Drug use: No   Sexual activity: Not on file  Other Topics Concern   Not on file  Social History Narrative   Has living will   Wife is health care POA---alternate is committee of physicians   Would accept resuscitation---wouldn't accept prolonged ventilation   No tube feeds if cognitively unaware   Right handed    Social Determinants of Health   Financial Resource Strain: Low Risk  (02/21/2021)   Overall Financial Resource Strain (CARDIA)    Difficulty of Paying Living Expenses: Not hard at all  Food Insecurity: Not on file  Transportation Needs: Not on file  Physical Activity: Not on file  Stress: Not on file  Social Connections: Not on file  Intimate Partner Violence: Not on file   Review of Systems Is taking Co-Q 10 Was worried about jardiance also---reviewed and doesn't cause muscle pain    Objective:   Physical Exam Constitutional:      Appearance:  Normal appearance.  Musculoskeletal:        General: No swelling or tenderness.     Comments: No synovitis in any joints Good ROM in hips  Neurological:     Mental Status: He is alert.            Assessment & Plan:

## 2022-06-01 NOTE — Assessment & Plan Note (Signed)
Seems likely to be from the rosuvastatin Had bad myalgias with atorvastatin also Only taking 10 three times a week--but could still be the problem Will have him stop the rosuvastatin Check sed rate and CPK---if abnormal, will permanently stay off all statins. If normal, and he is better off the rosuvastatin, could consider trying a different statin (?pravastatin)

## 2022-06-01 NOTE — Patient Instructions (Signed)
Stop the rosuvastatin for now.

## 2022-06-02 LAB — SEDIMENTATION RATE: Sed Rate: 37 mm/h — ABNORMAL HIGH (ref 0–20)

## 2022-06-02 LAB — CK: Total CK: 44 U/L (ref 7–232)

## 2022-06-19 ENCOUNTER — Other Ambulatory Visit (HOSPITAL_COMMUNITY): Payer: Self-pay | Admitting: Internal Medicine

## 2022-06-20 ENCOUNTER — Telehealth: Payer: Self-pay | Admitting: Internal Medicine

## 2022-06-20 NOTE — Telephone Encounter (Signed)
  Encourage patient to contact the pharmacy for refills or they can request refills through [facility]  Did the patient contact the pharmacy: Yes  LAST APPOINTMENT DATE: 06/01/2022  NEXT APPOINTMENT DATE: 08/01/2022  MEDICATION: sacubitril-valsartan (ENTRESTO) 24-26 MG   Is the patient out of medication? No  If not, how much is left? A week  PHARMACY:  [facility] ([place], [place]    Let patient know to contact pharmacy at the end of the day to make sure medication is ready.  Please notify patient to allow 48-72 hours to process

## 2022-06-20 NOTE — Telephone Encounter (Signed)
His cardiologist filled it today. This is not one Dr [name] fills.

## 2022-07-12 ENCOUNTER — Other Ambulatory Visit: Payer: Self-pay | Admitting: Internal Medicine

## 2022-07-12 DIAGNOSIS — E1121 Type 2 diabetes mellitus with diabetic nephropathy: Secondary | ICD-10-CM

## 2022-07-12 DIAGNOSIS — M791 Myalgia, unspecified site: Secondary | ICD-10-CM

## 2022-07-13 ENCOUNTER — Encounter: Payer: Self-pay | Admitting: Podiatry

## 2022-07-13 ENCOUNTER — Ambulatory Visit: Payer: PPO | Admitting: Podiatry

## 2022-07-13 DIAGNOSIS — Q828 Other specified congenital malformations of skin: Secondary | ICD-10-CM

## 2022-07-13 DIAGNOSIS — B351 Tinea unguium: Secondary | ICD-10-CM

## 2022-07-13 DIAGNOSIS — M79676 Pain in unspecified toe(s): Secondary | ICD-10-CM

## 2022-07-13 DIAGNOSIS — E1142 Type 2 diabetes mellitus with diabetic polyneuropathy: Secondary | ICD-10-CM

## 2022-07-13 DIAGNOSIS — N1831 Chronic kidney disease, stage 3a: Secondary | ICD-10-CM | POA: Diagnosis not present

## 2022-07-13 NOTE — Progress Notes (Signed)
This patient returns to my office for at risk foot care.  This patient requires this care by a professional since this patient will be at risk due to having  Diabetes and CKD.    This patient is unable to cut nails himself since the patient cannot reach his nails.These nails are painful walking and wearing shoes.  This patient presents for at risk foot care today.  General Appearance  Alert, conversant and in no acute stress.  Vascular  Dorsalis pedis and posterior tibial  pulses are palpable  bilaterally.  Capillary return is within normal limits  bilaterally. Temperature is within normal limits  bilaterally.  Neurologic  Senn-Weinstein monofilament wire test diminished  bilaterally. Muscle power within normal limits bilaterally.  Nails Thick disfigured discolored nails with subungual debris  from hallux to fifth toes bilaterally. No evidence of bacterial infection or drainage bilaterally.  Orthopedic  No limitations of motion  feet .  No crepitus or effusions noted.  No bony pathology or digital deformities noted.  Skin  normotropic skin with no porokeratosis noted bilaterally.  No signs of infections or ulcers noted.   Callus sub 5th  B/L.  Callis sub 1 left foot.  Onychomycosis  Pain in right toes  Pain in left toes  Porokeratosis  B/L.  Consent was obtained for treatment procedures.   Mechanical debridement of nails 1-5  bilaterally performed with a nail nipper.  Filed with dremel without incident. No infection or ulcer.  Debride callus with # 15 blade and dremel tool.   Return office visit  10 weeks                Told patient to return for periodic foot care and evaluation due to potential at risk complications.   Gardiner Barefoot DPM

## 2022-07-25 ENCOUNTER — Other Ambulatory Visit (INDEPENDENT_AMBULATORY_CARE_PROVIDER_SITE_OTHER): Payer: PPO

## 2022-07-25 DIAGNOSIS — E1121 Type 2 diabetes mellitus with diabetic nephropathy: Secondary | ICD-10-CM

## 2022-07-25 DIAGNOSIS — M791 Myalgia, unspecified site: Secondary | ICD-10-CM

## 2022-07-25 LAB — COMPREHENSIVE METABOLIC PANEL
ALT: 15 U/L (ref 0–53)
AST: 12 U/L (ref 0–37)
Albumin: 4.4 g/dL (ref 3.5–5.2)
Alkaline Phosphatase: 70 U/L (ref 39–117)
BUN: 26 mg/dL — ABNORMAL HIGH (ref 6–23)
CO2: 32 mEq/L (ref 19–32)
Calcium: 9.3 mg/dL (ref 8.4–10.5)
Chloride: 103 mEq/L (ref 96–112)
Creatinine, Ser: 1.08 mg/dL (ref 0.40–1.50)
GFR: 63.96 mL/min (ref >60.00)
Glucose, Bld: 143 mg/dL — ABNORMAL HIGH (ref 70–99)
Potassium: 4.1 mEq/L (ref 3.5–5.1)
Sodium: 144 mEq/L (ref 135–145)
Total Bilirubin: 0.5 mg/dL (ref 0.2–1.2)
Total Protein: 6.8 g/dL (ref 6.0–8.3)

## 2022-07-25 LAB — LIPID PANEL
Cholesterol: 214 mg/dL — ABNORMAL HIGH (ref 0–200)
HDL: 40.6 mg/dL (ref >39.00)
LDL Cholesterol: 137 mg/dL — ABNORMAL HIGH (ref 0–99)
NonHDL: 173.71
Total CHOL/HDL Ratio: 5
Triglycerides: 185 mg/dL — ABNORMAL HIGH (ref 0.0–149.0)
VLDL: 37 mg/dL (ref 0.0–40.0)

## 2022-07-25 LAB — CBC
HCT: 47.9 % (ref 39.0–52.0)
Hemoglobin: 15.5 g/dL (ref 13.0–17.0)
MCHC: 32.4 g/dL (ref 30.0–36.0)
MCV: 88.5 fl (ref 78.0–100.0)
Platelets: 333 10*3/uL (ref 150.0–400.0)
RBC: 5.41 Mil/uL (ref 4.22–5.81)
RDW: 17.7 % — ABNORMAL HIGH (ref 11.5–15.5)
WBC: 5 10*3/uL (ref 4.0–10.5)

## 2022-07-25 LAB — SEDIMENTATION RATE: Sed Rate: 20 mm/hr (ref 0–20)

## 2022-07-25 LAB — CK: Total CK: 36 U/L (ref 7–232)

## 2022-07-25 LAB — TSH: TSH: 2.28 u[IU]/mL (ref 0.35–5.50)

## 2022-07-25 LAB — HEMOGLOBIN A1C: Hgb A1c MFr Bld: 7.3 % — ABNORMAL HIGH (ref 4.6–6.5)

## 2022-08-01 ENCOUNTER — Encounter: Payer: Self-pay | Admitting: Internal Medicine

## 2022-08-01 ENCOUNTER — Ambulatory Visit (INDEPENDENT_AMBULATORY_CARE_PROVIDER_SITE_OTHER): Payer: PPO | Admitting: Internal Medicine

## 2022-08-01 VITALS — BP 130/88 | HR 86 | Temp 97.8°F | Ht 65.5 in | Wt 149.0 lb

## 2022-08-01 DIAGNOSIS — E785 Hyperlipidemia, unspecified: Secondary | ICD-10-CM | POA: Diagnosis not present

## 2022-08-01 DIAGNOSIS — E1121 Type 2 diabetes mellitus with diabetic nephropathy: Secondary | ICD-10-CM

## 2022-08-01 DIAGNOSIS — I5022 Chronic systolic (congestive) heart failure: Secondary | ICD-10-CM

## 2022-08-01 MED ORDER — SILDENAFIL CITRATE 20 MG PO TABS: ORAL_TABLET | ORAL | 5 refills | Status: DC

## 2022-08-01 NOTE — Assessment & Plan Note (Signed)
Severe myalgia with several statins---will not try again Discussed considering lipid clinic to consider other options---he wishes to hold off and will let me know if he wants to proceed

## 2022-08-01 NOTE — Patient Instructions (Signed)
The other cholesterol options would be repatha or bempedoic acid

## 2022-08-01 NOTE — Assessment & Plan Note (Signed)
Lab Results  Component Value Date   HGBA1C 7.3 (H) 07/25/2022   Still with good control despite dietary indiscretion over the holidays Going back to using the CGM Continue the jardiance 10, metformin '500mg'$  bid

## 2022-08-01 NOTE — Assessment & Plan Note (Signed)
This is compensated with the entresto 24/26 bid, and jardiance Takes asa

## 2022-08-01 NOTE — Progress Notes (Signed)
Subjective:    Patient ID: James Braun, male    DOB: 06-27-1941, 82 y.o.   MRN: [id number]  HPI Here for follow up of diabetes and other medical conditions  Myalgia is better off the statin Took 2-3 weeks for the muscle pain to go away Doubled his krill oil and on glucosamine/chondroitin and cumin Joint pains are acceptable with that  No chest pain No SOB Will walk 20 minutes daily  Still works outside---like tree work No dizziness or syncope  Happy about the A1c Hadn't been as careful with eating over the holidays--doing a better job now Hasn't been using CGM--but now getting back to it  Current Outpatient Medications on File Prior to Visit  Medication Sig Dispense Refill   alum hydroxide-mag trisilicate (GAVISCON) 70-48 MG CHEW chewable tablet Chew 2 tablets by mouth 3 (three) times daily as needed for indigestion or heartburn.     aspirin EC 81 MG tablet Take 1 tablet (81 mg total) by mouth daily. Swallow whole. 90 tablet 3   Cyanocobalamin (VITAMIN B-12) 1000 MCG SUBL Place 1,000 mcg under the tongue. 1000 mcg SL daily     empagliflozin (JARDIANCE) 10 MG TABS tablet Take 1 tablet (10 mg total) by mouth daily before breakfast. 90 tablet 3   ENTRESTO 24-26 MG Take 1 tablet by mouth twice a day 180 tablet 2   ferrous sulfate 325 (65 FE) MG tablet Take 325 mg by mouth daily with breakfast.     finasteride (PROSCAR) 5 MG tablet Take 1 tablet (5 mg total) by mouth daily. 90 tablet 3   LUMIGAN 0.01 % SOLN Place 1 drop into the left eye daily.     metFORMIN (GLUCOPHAGE-XR) 500 MG 24 hr tablet Take 1 tablet (500 mg total) by mouth 2 (two) times daily. 180 tablet 3   Multiple Vitamin (MULTIVITAMIN) tablet Take 1 tablet by mouth daily.     omeprazole (PRILOSEC) 20 MG capsule TAKE 1 CAPSULE BY MOUTH EVERY DAY 90 capsule 3   Potassium Citrate 15 MEQ (1620 MG) TBCR Take 1 tablet by mouth 3 (three) times daily. 270 tablet 3   sildenafil (REVATIO) 20 MG tablet TAKE 3-5 TABLETS BY MOUTH  EVERY DAY AS NEEDED 50 tablet 5   SIMBRINZA 1-0.2 % SUSP Place 1 drop into the left eye 2 (two) times daily.     timolol (TIMOPTIC) 0.5 % ophthalmic solution Place 1 drop into both eyes 2 (two) times daily.      Vitamin D3 (VITAMIN D) 25 MCG tablet Take 1 tablet by mouth 2 (two) times daily.     No current facility-administered medications on file prior to visit.    Allergies  Allergen Reactions   Lipitor [Atorvastatin] Other (See Comments)    myalgias   Lisinopril    Morphine And Related Other (See Comments)     hypotension   Levofloxacin Rash   Septra [Sulfamethoxazole-Trimethoprim] Rash   Sulfamethoxazole-Trimethoprim Rash    Past Medical History:  Diagnosis Date   BPH (benign prostatic hypertrophy)    Cancer (HCC)    melanoma   Coronary artery disease    History of kidney stones    History of melanoma excision OCT 2010   Hyperlipidemia    Hypertension    Left ureteral calculus    Nephrolithiasis    bilateral   Type 2 diabetes mellitus ([place])     Past Surgical History:  Procedure Laterality Date   CARDIAC CATHETERIZATION     CHOLECYSTECTOMY  01/1999  CORNEAL TRANSPLANT Right 2023   CORONARY ARTERY BYPASS GRAFT N/A 11/28/2019   Procedure: CORONARY ARTERY BYPASS GRAFTING (CABG) x3, using left internal mammary artery and Saphenous vein harvested endoscopically;  Surgeon: [name], MD;  Location: [facility];  Service: Open Heart Surgery;  Laterality: N/A;   CYSTOSCOPY WITH STENT PLACEMENT Left 11/14/2012   Procedure: CYSTOSCOPY WITH STENT PLACEMENT;  Surgeon: [name], MD;  Location: [facility];  Service: Urology;  Laterality: Left;   CYSTOSCOPY/RETROGRADE/URETEROSCOPY/STONE EXTRACTION WITH BASKET Left 11/14/2012   Procedure: CYSTOSCOPY/RETROGRADE/URETEROSCOPY/STONE EXTRACTION WITH BASKET;  Surgeon: [name], MD;  Location: [facility];  Service: Urology;  Laterality: Left;   EXTRACORPOREAL SHOCK WAVE LITHOTRIPSY   01/1999   X2   HOLMIUM LASER APPLICATION Left 21/19/4174   Procedure: HOLMIUM LASER APPLICATION;  Surgeon: [name], MD;  Location[facility];  Service: Urology;  Laterality: Left;   LEFT HEART CATH AND CORONARY ANGIOGRAPHY N/A 11/27/2019   Procedure: LEFT HEART CATH AND CORONARY ANGIOGRAPHY;  Surgeon: [name], MD;  Location: [facility];  Service: Cardiovascular;  Laterality: N/A;   LEFT URETEROSCOPIC STONE EXTRACTION  02/22/2007   NEPHROLITHOTOMY Right 09/28/2010   percutaneous   ORIF CLAVICLE FRACTURE  2006   PROSTATE BIOPSY  1998   neg   SHOULDER ARTHROSCOPY  07/2013   Dr [name]   TEE WITHOUT CARDIOVERSION N/A 11/28/2019   Procedure: TRANSESOPHAGEAL ECHOCARDIOGRAM (TEE);  Surgeon: [name], Collier Salina, MD;  Location: [facility];  Service: Open Heart Surgery;  Laterality: N/A;   TONSILLECTOMY      Family History  Problem Relation Age of Onset   Hypertension Mother    Diabetes Father    Hypertension Father    Colon polyps Brother    Coronary artery disease Neg Hx    Cancer Neg Hx    Esophageal cancer Neg Hx    Stomach cancer Neg Hx    Rectal cancer Neg Hx     Social History   Socioeconomic History   Marital status: Married    Spouse name: Not on file   Number of children: 2   Years of education: Not on file   Highest education level: Not on file  Occupational History   Occupation: Research officer, 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   Occupation: Buys very distressed houses and rehabilitates   Occupation: Does mediation  Tobacco Use   Smoking status: Never    Passive exposure: Never   Smokeless tobacco: Never  Substance and Sexual Activity   Alcohol use: No   Drug use: No   Sexual activity: Not on file  Other Topics Concern   Not on file  Social History Narrative   Has living will   Wife is health care POA---alternate is committee of physicians   Would accept resuscitation---wouldn't accept prolonged ventilation   No tube feeds if cognitively  unaware   Right handed   Social Determinants of Health   Financial Resource Strain: Low Risk  (02/21/2021)   Overall Financial Resource Strain (CARDIA)    Difficulty of Paying Living Expenses: Not hard at all  Food Insecurity: Not on file  Transportation Needs: Not on file  Physical Activity: Not on file  Stress: Not on file  Social Connections: Not on file  Intimate Partner Violence: Not on file   Review of Systems Appetite is fine Weight stable Sleeps okay    Objective:   Physical Exam Constitutional:      Appearance: Normal appearance.  Cardiovascular:  Rate and Rhythm: Normal rate and regular rhythm.     Pulses: Normal pulses.     Heart sounds: No murmur heard.    No gallop.  Pulmonary:     Effort: Pulmonary effort is normal.     Breath sounds: Normal breath sounds. No wheezing or rales.  Musculoskeletal:     Right lower leg: No edema.     Left lower leg: No edema.  Skin:    Comments: No foot lesions  Neurological:     Mental Status: He is alert.  Psychiatric:        Mood and Affect: Mood normal.        Behavior: Behavior normal.            Assessment & Plan:

## 2022-08-23 DIAGNOSIS — Z947 Corneal transplant status: Secondary | ICD-10-CM | POA: Diagnosis not present

## 2022-08-28 ENCOUNTER — Other Ambulatory Visit: Payer: Self-pay | Admitting: Internal Medicine

## 2022-08-29 DIAGNOSIS — Z947 Corneal transplant status: Secondary | ICD-10-CM | POA: Diagnosis not present

## 2022-10-12 ENCOUNTER — Encounter: Payer: Self-pay | Admitting: Podiatry

## 2022-10-12 ENCOUNTER — Ambulatory Visit: Payer: PPO | Admitting: Podiatry

## 2022-10-12 DIAGNOSIS — Q828 Other specified congenital malformations of skin: Secondary | ICD-10-CM | POA: Diagnosis not present

## 2022-10-12 DIAGNOSIS — B351 Tinea unguium: Secondary | ICD-10-CM

## 2022-10-12 DIAGNOSIS — M79676 Pain in unspecified toe(s): Secondary | ICD-10-CM | POA: Diagnosis not present

## 2022-10-12 DIAGNOSIS — E1142 Type 2 diabetes mellitus with diabetic polyneuropathy: Secondary | ICD-10-CM

## 2022-10-12 NOTE — Progress Notes (Signed)
This patient returns to my office for at risk foot care.  This patient requires this care by a professional since this patient will be at risk due to having  Diabetes and CKD.    This patient is unable to cut nails himself since the patient cannot reach his nails.These nails are painful walking and wearing shoes.  This patient presents for at risk foot care today.  General Appearance  Alert, conversant and in no acute stress.  Vascular  Dorsalis pedis and posterior tibial  pulses are palpable  bilaterally.  Capillary return is within normal limits  bilaterally. Temperature is within normal limits  bilaterally.  Neurologic  Senn-Weinstein monofilament wire test diminished  bilaterally. Muscle power within normal limits bilaterally.  Nails Thick disfigured discolored nails with subungual debris  from hallux to fifth toes bilaterally. No evidence of bacterial infection or drainage bilaterally.  Orthopedic  No limitations of motion  feet .  No crepitus or effusions noted.  No bony pathology or digital deformities noted.  Skin  normotropic skin with no porokeratosis noted bilaterally.  No signs of infections or ulcers noted.   Callus sub 5th  B/L.  Callis sub 1 left foot.  Onychomycosis  Pain in right toes  Pain in left toes  Porokeratosis  B/L.  Consent was obtained for treatment procedures.   Mechanical debridement of nails 1-5  bilaterally performed with a nail nipper.  Filed with dremel without incident. No infection or ulcer.  Debride callus with # 15 blade and dremel tool.   Return office visit  10 weeks                Told patient to return for periodic foot care and evaluation due to potential at risk complications.   Bobby Barton DPM  

## 2022-10-17 ENCOUNTER — Telehealth: Payer: Self-pay

## 2022-10-17 NOTE — Progress Notes (Signed)
Care Management & Coordination Services Pharmacy Team  Reason for Encounter: General Adherence Update   Contacted patient for general health update and medication adherence call.  Spoke with patient on 10/17/2022   What concerns do you have about your medications? None  The patient denies side effects with their medications.   How often do you forget or accidentally miss a dose? Never  Do you use a pillbox? Yes  Are you having any problems getting your medications from your pharmacy? No  Has the cost of your medications been a concern? No  The patient has not had an ED visit since last contact.   The patient reports the following problems with their health. Patient reported his left knee has been bothering him. Stated he has an appointment coming up with Dr. Lorre Nick. Patient is expecting minor surgery. Stated if he climbs steps, walks up a hill, down steps or bends it more than normal it hurts.   Patient denies concerns or questions for Charlene Brooke, PharmD at this time.   Chart Updates:  Recent office visits:  08/01/22 Viviana Simpler, MD DM Start: CGM (restart) No med changes F/U  6 months 06/01/22 Viviana Simpler, MD [facility] "The muscle enzyme test (CPK) is normal and the sed rate is only slightly up. This means we could consider another cholesterol medicine in the future--but if your symptoms improve off it, I would be reluctant to try another one." Hold: Rosuvastatin for now   Recent consult visits:  10/12/22 Gardiner Barefoot, DPM Pain due to Onychomycosis Procedure: Nail debridement F/U 10 weeks 09/08/22 Leitha Bleak, MD Post eye surgery Stop: Oflox F/U 1 month 08/29/22 Doyce Para, MD [facility] Status post corneal transplant No other information 08/23/22 Doyce Para, MD [facility] Status post corneal transplant No other information 07/19/22 Doyce Para, MD [facility] Status post corneal transplant No med changes 07/13/22 Gardiner Barefoot, DPM Pain due to  Onychomycosis Procedure: Nail debridement 06/14/22 Doyce Para, MD [facility] Status post corneal transplant Change: Sodium Chloride 5% 05/31/22 Doyce Para, MD [facility] Status post corneal transplant No med changes 05/24/22 Doyce Para, MD [facility] Status post corneal transplant; Pseudophakia of right eye; Combined forms of age-related cataract of left eye  05/23/22 [facility] Pre-op - Fuchs' corneal dystrophy No other information  Hospital visits:  None in previous 6 months  Medications: Outpatient Encounter Medications as of 10/17/2022  Medication Sig   alum hydroxide-mag trisilicate (GAVISCON) [id number] MG CHEW chewable tablet Chew 2 tablets by mouth 3 (three) times daily as needed for indigestion or heartburn.   aspirin EC 81 MG tablet Take 1 tablet (81 mg total) by mouth daily. Swallow whole.   Cyanocobalamin (VITAMIN B-12) 1000 MCG SUBL Place 1,000 mcg under the tongue. 1000 mcg SL daily   empagliflozin (JARDIANCE) 10 MG TABS tablet Take 1 tablet by mouth daily before breakfast   ENTRESTO 24-26 MG Take 1 tablet by mouth twice a day   ferrous sulfate 325 (65 FE) MG tablet Take 325 mg by mouth daily with breakfast.   finasteride (PROSCAR) 5 MG tablet Take 1 tablet by mouth daily   LUMIGAN 0.01 % SOLN Place 1 drop into the left eye daily.   metFORMIN (GLUCOPHAGE-XR) 500 MG 24 hr tablet Take 1 tablet by mouth twice a day   Multiple Vitamin (MULTIVITAMIN) tablet Take 1 tablet by mouth daily.   omeprazole (PRILOSEC) 20 MG capsule TAKE 1 CAPSULE BY MOUTH EVERY DAY  Potassium Citrate 15 MEQ (1620 MG) TBCR Take 1 tablet by mouth 3 (three) times daily.   sildenafil (REVATIO) 20 MG tablet TAKE 3-5 TABLETS BY MOUTH EVERY DAY AS NEEDED   SIMBRINZA 1-0.2 % SUSP Place 1 drop into the left eye 2 (two) times daily.   timolol (TIMOPTIC) 0.5 % ophthalmic solution Place 1 drop into both eyes 2 (two) times daily.    Vitamin D3 (VITAMIN D) 25 MCG tablet Take 1 tablet by mouth 2  (two) times daily.   No facility-administered encounter medications on file as of 10/17/2022.    Recent vitals BP Readings from Last 3 Encounters:  08/01/22 130/88  06/01/22 130/80  03/28/22 106/70   Pulse Readings from Last 3 Encounters:  08/01/22 86  06/01/22 87  03/28/22 72   Wt Readings from Last 3 Encounters:  08/01/22 149 lb (67.6 kg)  06/01/22 148 lb (67.1 kg)  03/28/22 152 lb (68.9 kg)   BMI Readings from Last 3 Encounters:  08/01/22 24.42 kg/m  06/01/22 24.25 kg/m  03/28/22 24.91 kg/m    Recent lab results    Component Value Date/Time   NA 144 07/25/2022 0746   NA 139 11/21/2019 1037   K 4.1 07/25/2022 0746   CL 103 07/25/2022 0746   CO2 32 07/25/2022 0746   GLUCOSE 143 (H) 07/25/2022 0746   BUN 26 (H) 07/25/2022 0746   BUN 21 11/21/2019 1037   CREATININE 1.08 07/25/2022 0746   CREATININE 1.16 11/08/2020 1352   CALCIUM 9.3 07/25/2022 0746    Lab Results  Component Value Date   CREATININE 1.08 07/25/2022   GFR 63.96 07/25/2022   GFRNONAA >60 11/08/2020   GFRAA 49 (L) 01/06/2020   Lab Results  Component Value Date/Time   HGBA1C 7.3 (H) 07/25/2022 07:46 AM   HGBA1C 7.6 (H) 01/18/2022 07:56 AM   MICROALBUR 12.0 (H) 02/15/2015 02:33 PM   MICROALBUR 8.8 (H) 12/25/2011 10:25 AM    Lab Results  Component Value Date   CHOL 214 (H) 07/25/2022   HDL 40.60 07/25/2022   LDLCALC 137 (H) 07/25/2022   LDLDIRECT 141.5 02/03/2013   TRIG 185.0 (H) 07/25/2022   CHOLHDL 5 07/25/2022    Care Gaps: Annual wellness visit in last year? Yes 01/24/2022  If Diabetic: Last eye exam / retinopathy screening: Overdue Last diabetic foot exam: Up to date Last UACR:  Overdue  Star Rating Drugs:  Medication:  Last Fill: Day Supply Jardiance 10 mg 08/29/2022 90 Metformin 500 mg 08/29/2022 [place], PharmD notified  Marijean Niemann, [facility] Assistant [phone] '

## 2022-11-01 DIAGNOSIS — H401122 Primary open-angle glaucoma, left eye, moderate stage: Secondary | ICD-10-CM | POA: Diagnosis not present

## 2022-11-01 DIAGNOSIS — H2512 Age-related nuclear cataract, left eye: Secondary | ICD-10-CM | POA: Diagnosis not present

## 2022-11-09 ENCOUNTER — Telehealth: Payer: Self-pay | Admitting: Cardiology

## 2022-11-09 ENCOUNTER — Telehealth: Payer: Self-pay | Admitting: Internal Medicine

## 2022-11-09 DIAGNOSIS — M25562 Pain in left knee: Secondary | ICD-10-CM | POA: Diagnosis not present

## 2022-11-09 DIAGNOSIS — G8929 Other chronic pain: Secondary | ICD-10-CM | POA: Diagnosis not present

## 2022-11-09 DIAGNOSIS — M1712 Unilateral primary osteoarthritis, left knee: Secondary | ICD-10-CM | POA: Diagnosis not present

## 2022-11-09 NOTE — Telephone Encounter (Signed)
Patient is returning call.  °

## 2022-11-09 NOTE — Telephone Encounter (Signed)
   Name: James Braun  DOB: 1940/11/28  MRN: [id number]  Primary Cardiologist: Donato Schultz, MD  Chart reviewed as part of pre-operative protocol coverage. Because of James Braun past medical history and time since last visit, he will require a follow-up in-office visit in order to better assess preoperative cardiovascular risk.  Pre-op covering staff: - Please schedule appointment and call patient to inform them. If patient already had an upcoming appointment within acceptable timeframe, please add "pre-op clearance" to the appointment notes so provider is aware. - Please contact requesting surgeon's office via preferred method (i.e, phone, fax) to inform them of need for appointment prior to surgery.   Joylene Grapes, NP  11/09/2022, 12:52 PM

## 2022-11-09 NOTE — Telephone Encounter (Signed)
I received a message today that was not very clear. Not sure if the pt dropped off a clearance form for surgeon office. I can see in the chart looks like the pt saw an orthopedic doctor today. We will ned the surgeon office to fax over a clearance request to [phone] attn: pre op team. I left a message for the pt to call back may he can shed a little light on the subject.

## 2022-11-09 NOTE — Telephone Encounter (Signed)
Patient dropped off document Surgical Clearance, to be filled out by provider. Patient requested to send it via Fax within 5-days. Document is located in providers tray at front office.Please advise at [facility] [phone]

## 2022-11-09 NOTE — Telephone Encounter (Signed)
Pt has been scheduled to see Dr. Anne Fu per pt request to see MD for pre op clearance. I will update all parties involved.

## 2022-11-09 NOTE — Telephone Encounter (Signed)
   Pre-operative Risk Assessment    Patient Name: James Braun  DOB: 1941-01-31 MRN: [id number]      Request for Surgical Clearance    Procedure:   LEFT KNEE REPLACEMENT  Date of Surgery:  Clearance TBD                                 Surgeon:  DR. Georgena Spurling Surgeon's Group or Practice Name:  [facility] Phone number:  [phone] ATTN: Elnita [facility] Fax number:  [phone]; [phone]   Type of Clearance Requested:   - Medical  - Pharmacy:  Hold Aspirin     Type of Anesthesia:  Spinal   Additional requests/questions:    Elpidio Anis   11/09/2022, 11:44 AM

## 2022-11-09 NOTE — Telephone Encounter (Signed)
Spoke to pt. Advised him that since cardiology is doing an assessment, we do not need to as we would do the same thing they are doing. Also, its best to come from his cardiologist and not [place]. He wants me to call the surgeons office and tell them

## 2022-11-09 NOTE — Telephone Encounter (Signed)
Looks like cardiology has called him to set up a Pre-Op clearance visit with them. Waiting for him to call them back. Does he still need a visit with [place] if he sees them?

## 2022-11-09 NOTE — Telephone Encounter (Signed)
Left message for pt to call back and schedule a in office appt for pre op clearance.

## 2022-11-09 NOTE — Telephone Encounter (Signed)
Patient need filled out for upcoming knee surgery.

## 2022-11-09 NOTE — Telephone Encounter (Signed)
Called and left a message for Bridgett Larsson at [facility]. Advised that we did not think we needed to do the same pre-op he would be getting at cardiology. Asked her to let us know if we still needed a pre-op visit with him.

## 2022-11-13 NOTE — Telephone Encounter (Signed)
Pt canceled his appt with Dr. Anne Fu on 5/6. He states he is not going through with his knee replacement surgery.

## 2022-11-13 NOTE — Telephone Encounter (Signed)
Thank you for the update regarding his scheduled procedure and clearance request.  I will remove patients request from the preop box.

## 2022-11-13 NOTE — Telephone Encounter (Signed)
Elnita Maxwell called back and stated they will still need a form filled out by Dr Alphonsus Sias. Stated can use most recent OV and check needs to see cardiology. Cardiology will see him 11-20-22.

## 2022-11-14 NOTE — Telephone Encounter (Signed)
Spoke to pt to let him know we faxed the last OV note and labs to the ortho office and surgery center with a note that he has to be cleared by cardiology.

## 2022-11-20 ENCOUNTER — Ambulatory Visit: Payer: PPO | Admitting: Cardiology

## 2022-12-19 DIAGNOSIS — Z85820 Personal history of malignant melanoma of skin: Secondary | ICD-10-CM | POA: Diagnosis not present

## 2022-12-19 DIAGNOSIS — B351 Tinea unguium: Secondary | ICD-10-CM | POA: Diagnosis not present

## 2022-12-19 DIAGNOSIS — X32XXXD Exposure to sunlight, subsequent encounter: Secondary | ICD-10-CM | POA: Diagnosis not present

## 2022-12-19 DIAGNOSIS — C44311 Basal cell carcinoma of skin of nose: Secondary | ICD-10-CM | POA: Diagnosis not present

## 2022-12-19 DIAGNOSIS — Z08 Encounter for follow-up examination after completed treatment for malignant neoplasm: Secondary | ICD-10-CM | POA: Diagnosis not present

## 2022-12-19 DIAGNOSIS — Z1283 Encounter for screening for malignant neoplasm of skin: Secondary | ICD-10-CM | POA: Diagnosis not present

## 2022-12-19 DIAGNOSIS — D225 Melanocytic nevi of trunk: Secondary | ICD-10-CM | POA: Diagnosis not present

## 2022-12-19 DIAGNOSIS — L570 Actinic keratosis: Secondary | ICD-10-CM | POA: Diagnosis not present

## 2022-12-27 DIAGNOSIS — H2512 Age-related nuclear cataract, left eye: Secondary | ICD-10-CM | POA: Diagnosis not present

## 2022-12-27 DIAGNOSIS — H401132 Primary open-angle glaucoma, bilateral, moderate stage: Secondary | ICD-10-CM | POA: Diagnosis not present

## 2022-12-27 DIAGNOSIS — H353131 Nonexudative age-related macular degeneration, bilateral, early dry stage: Secondary | ICD-10-CM | POA: Diagnosis not present

## 2023-01-11 ENCOUNTER — Encounter: Payer: Self-pay | Admitting: Podiatry

## 2023-01-11 ENCOUNTER — Ambulatory Visit: Payer: PPO | Admitting: Podiatry

## 2023-01-11 VITALS — BP 162/84 | HR 78

## 2023-01-11 DIAGNOSIS — M2042 Other hammer toe(s) (acquired), left foot: Secondary | ICD-10-CM

## 2023-01-11 DIAGNOSIS — M79676 Pain in unspecified toe(s): Secondary | ICD-10-CM | POA: Diagnosis not present

## 2023-01-11 DIAGNOSIS — E1142 Type 2 diabetes mellitus with diabetic polyneuropathy: Secondary | ICD-10-CM

## 2023-01-11 DIAGNOSIS — N1831 Chronic kidney disease, stage 3a: Secondary | ICD-10-CM | POA: Diagnosis not present

## 2023-01-11 DIAGNOSIS — M2041 Other hammer toe(s) (acquired), right foot: Secondary | ICD-10-CM

## 2023-01-11 DIAGNOSIS — B351 Tinea unguium: Secondary | ICD-10-CM

## 2023-01-11 DIAGNOSIS — Q828 Other specified congenital malformations of skin: Secondary | ICD-10-CM

## 2023-01-15 NOTE — Progress Notes (Signed)
  Subjective:  Patient ID: James Braun, male    DOB: 04/21/1941,  MRN: [id number]  James Braun presents to clinic today for at risk foot care with history of diabetic neuropathy and callus(es) both feet and painful thick toenails that are difficult to trim. Painful toenails interfere with ambulation. Aggravating factors include wearing enclosed shoe gear. Pain is relieved with periodic professional debridement. Painful calluses are aggravated when weightbearing with and without shoegear. Pain is relieved with periodic professional debridement.  Chief Complaint  Patient presents with   Diabetes    "I want my calluses taken off and I can't trim my own toenails."   New problem(s): None.   PCP is Karie Schwalbe, MD.  Allergies  Allergen Reactions   Lipitor [Atorvastatin] Other (See Comments)    myalgias   Lisinopril    Morphine And Codeine Other (See Comments)     hypotension   Levofloxacin Rash   Septra [Sulfamethoxazole-Trimethoprim] Rash   Sulfamethoxazole-Trimethoprim Rash    Review of Systems: Negative except as noted in the HPI.  Objective: No changes noted in today's physical examination. Vitals:   01/11/23 1147  BP: (!) 162/84  Pulse: 78   James Braun is a pleasant 82 y.o. male WD, WN in NAD. AAO x 3.  Vascular Examination: Capillary refill time immediate b/l. Vascular status intact b/l with palpable pedal pulses. Pedal hair present b/l. No pain with calf compression b/l. Skin temperature gradient WNL b/l. No cyanosis or clubbing b/l. No ischemia or gangrene noted b/l.   Neurological Examination: Protective sensation diminished with 10g monofilament b/l.  Dermatological Examination: Pedal skin with normal turgor, texture and tone b/l.  No open wounds. No interdigital macerations.   Toenails 1-5 b/l thick, discolored, elongated with subungual debris and pain on dorsal palpation.   Hyperkeratotic lesion(s) submet head 1 left foot and submet head 5 b/l.  No  erythema, no edema, no drainage, no fluctuance.  Musculoskeletal Examination: Normal muscle strength 5/5 to all lower extremity muscle groups bilaterally. No pain, crepitus or joint limitation noted with ROM b/l LE. No gross bony pedal deformities b/l. Patient ambulates independently without assistive aids.  Radiographs: None  Last A1c:      Latest Ref Rng & Units 07/25/2022    7:46 AM 01/18/2022    7:56 AM  Hemoglobin A1C  Hemoglobin-A1c 4.6 - 6.5 % 7.3  7.6    Assessment/Plan: 1. Pain due to onychomycosis of toenail   2. Porokeratosis   3. Hammer toes of both feet   4. Stage 3a chronic kidney disease (HCC)   5. Diabetic polyneuropathy associated with type 2 diabetes mellitus (HCC)     -Patient was evaluated and treated. All patient's and/or POA's questions/concerns answered on today's visit. -Continue foot and shoe inspections daily. Monitor blood glucose per PCP/Endocrinologist's recommendations. -Patient to continue soft, supportive shoe gear daily. -Toenails 1-5 b/l were debrided in length and girth with sterile nail nippers and dremel without iatrogenic bleeding.  -Porokeratotic lesion(s) submet head 1 left foot and submet head 3 b/l pared and enucleated with sterile currette without incident. Total number of lesions debrided=3. -Patient/POA to call should there be question/concern in the interim.   Return in about 10 weeks (around 03/22/2023).  Freddie Breech, DPM

## 2023-01-21 ENCOUNTER — Other Ambulatory Visit: Payer: Self-pay | Admitting: Internal Medicine

## 2023-01-21 DIAGNOSIS — E1121 Type 2 diabetes mellitus with diabetic nephropathy: Secondary | ICD-10-CM

## 2023-01-21 DIAGNOSIS — I5022 Chronic systolic (congestive) heart failure: Secondary | ICD-10-CM

## 2023-01-24 ENCOUNTER — Other Ambulatory Visit (INDEPENDENT_AMBULATORY_CARE_PROVIDER_SITE_OTHER): Payer: PPO

## 2023-01-24 DIAGNOSIS — I5022 Chronic systolic (congestive) heart failure: Secondary | ICD-10-CM

## 2023-01-24 DIAGNOSIS — E1121 Type 2 diabetes mellitus with diabetic nephropathy: Secondary | ICD-10-CM | POA: Diagnosis not present

## 2023-01-24 LAB — RENAL FUNCTION PANEL
Albumin: 4 g/dL (ref 3.5–5.2)
BUN: 23 mg/dL (ref 6–23)
CO2: 30 mEq/L (ref 19–32)
Calcium: 9.1 mg/dL (ref 8.4–10.5)
Chloride: 104 mEq/L (ref 96–112)
Creatinine, Ser: 0.98 mg/dL (ref 0.40–1.50)
GFR: 71.62 mL/min (ref >60.00)
Glucose, Bld: 174 mg/dL — ABNORMAL HIGH (ref 70–99)
Phosphorus: 3.3 mg/dL (ref 2.3–4.6)
Potassium: 3.5 mEq/L (ref 3.5–5.1)
Sodium: 143 mEq/L (ref 135–145)

## 2023-01-24 LAB — HEMOGLOBIN A1C: Hgb A1c MFr Bld: 7.5 % — ABNORMAL HIGH (ref 4.6–6.5)

## 2023-01-24 LAB — HEPATIC FUNCTION PANEL
ALT: 8 U/L (ref 0–53)
AST: 8 U/L (ref 0–37)
Albumin: 4 g/dL (ref 3.5–5.2)
Alkaline Phosphatase: 56 U/L (ref 39–117)
Bilirubin, Direct: 0.1 mg/dL (ref 0.0–0.3)
Total Bilirubin: 0.5 mg/dL (ref 0.2–1.2)
Total Protein: 6.2 g/dL (ref 6.0–8.3)

## 2023-01-24 LAB — CBC
HCT: 45.4 % (ref 39.0–52.0)
Hemoglobin: 14.5 g/dL (ref 13.0–17.0)
MCHC: 32 g/dL (ref 30.0–36.0)
MCV: 87.9 fl (ref 78.0–100.0)
Platelets: 307 10*3/uL (ref 150.0–400.0)
RBC: 5.17 Mil/uL (ref 4.22–5.81)
RDW: 17.2 % — ABNORMAL HIGH (ref 11.5–15.5)
WBC: 5.3 10*3/uL (ref 4.0–10.5)

## 2023-01-24 LAB — LIPID PANEL
Cholesterol: 197 mg/dL (ref 0–200)
HDL: 33.7 mg/dL — ABNORMAL LOW (ref >39.00)
LDL Cholesterol: 127 mg/dL — ABNORMAL HIGH (ref 0–99)
NonHDL: 163.41
Total CHOL/HDL Ratio: 6
Triglycerides: 180 mg/dL — ABNORMAL HIGH (ref 0.0–149.0)
VLDL: 36 mg/dL (ref 0.0–40.0)

## 2023-01-24 LAB — TSH: TSH: 0.95 u[IU]/mL (ref 0.35–5.50)

## 2023-01-30 ENCOUNTER — Encounter: Payer: PPO | Admitting: Internal Medicine

## 2023-01-30 DIAGNOSIS — L570 Actinic keratosis: Secondary | ICD-10-CM | POA: Diagnosis not present

## 2023-01-30 DIAGNOSIS — X32XXXD Exposure to sunlight, subsequent encounter: Secondary | ICD-10-CM | POA: Diagnosis not present

## 2023-01-30 DIAGNOSIS — Z08 Encounter for follow-up examination after completed treatment for malignant neoplasm: Secondary | ICD-10-CM | POA: Diagnosis not present

## 2023-01-30 DIAGNOSIS — Z85828 Personal history of other malignant neoplasm of skin: Secondary | ICD-10-CM | POA: Diagnosis not present

## 2023-01-31 ENCOUNTER — Ambulatory Visit (INDEPENDENT_AMBULATORY_CARE_PROVIDER_SITE_OTHER): Payer: PPO | Admitting: Internal Medicine

## 2023-01-31 ENCOUNTER — Encounter: Payer: Self-pay | Admitting: Internal Medicine

## 2023-01-31 VITALS — BP 136/82 | HR 82 | Temp 97.9°F | Ht 65.0 in | Wt 144.0 lb

## 2023-01-31 DIAGNOSIS — Z0000 Encounter for general adult medical examination without abnormal findings: Secondary | ICD-10-CM | POA: Diagnosis not present

## 2023-01-31 DIAGNOSIS — T466X5A Adverse effect of antihyperlipidemic and antiarteriosclerotic drugs, initial encounter: Secondary | ICD-10-CM

## 2023-01-31 DIAGNOSIS — I480 Paroxysmal atrial fibrillation: Secondary | ICD-10-CM

## 2023-01-31 DIAGNOSIS — G720 Drug-induced myopathy: Secondary | ICD-10-CM

## 2023-01-31 DIAGNOSIS — E1121 Type 2 diabetes mellitus with diabetic nephropathy: Secondary | ICD-10-CM

## 2023-01-31 DIAGNOSIS — I5022 Chronic systolic (congestive) heart failure: Secondary | ICD-10-CM | POA: Diagnosis not present

## 2023-01-31 LAB — HM DIABETES FOOT EXAM

## 2023-01-31 NOTE — Assessment & Plan Note (Signed)
Has been compensated on the entresto 24/26 bid, jardiance Off statin due to myalgia

## 2023-01-31 NOTE — Assessment & Plan Note (Signed)
Lab Results  Component Value Date   HGBA1C 7.5 (H) 01/24/2023   Still with good control on the jardiance 10mg  and metformin 500 bid

## 2023-01-31 NOTE — Assessment & Plan Note (Signed)
Doing much better off the statin

## 2023-01-31 NOTE — Progress Notes (Signed)
Subjective:    Patient ID: James Braun, male    DOB: 07-10-1941, 82 y.o.   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--still with vision problems  Hearing amplifier did help on right Does some light walking No alcohol or tobacco No falls No depression or anhedonia Independent with instrumental ADLs No sig memory issues--just trouble with names  Muscle aching really got better since off statin Joints are better with curamin and HA Joint formula--as well as tylenol 1300mg  bid  Heart is okay No chest pain or SOB No edema--weighs regularly (very stable) No palpitations  Gets some lightheadedness over months--but not really like he will pass out  No clear neuropathy --and no pain Does seem to have some mild balance issues Checks sugars intermittently ---has the CGM but doesn't wear it all the time Mostly in range--doesn't remember the range No hypoglycemic reactions  Takes omeprazole daily That controls heartburn---no dysphagia  Current Outpatient Medications on File Prior to Visit  Medication Sig Dispense Refill   Acetaminophen (TYLENOL 8 HOUR ARTHRITIS PAIN PO) Take by mouth.     alum hydroxide-mag trisilicate (GAVISCON) 80-20 MG CHEW chewable tablet Chew 2 tablets by mouth 3 (three) times daily as needed for indigestion or heartburn.     aspirin EC 81 MG tablet Take 1 tablet (81 mg total) by mouth daily. Swallow whole. 90 tablet 3   Cyanocobalamin (VITAMIN B-12) 1000 MCG SUBL Place 1,000 mcg under the tongue. 1000 mcg SL daily     empagliflozin (JARDIANCE) 10 MG TABS tablet Take 1 tablet by mouth daily before breakfast 90 tablet 3   ENTRESTO 24-26 MG  Take 1 tablet by mouth twice a day 180 tablet 2   ferrous sulfate 325 (65 FE) MG tablet Take 325 mg by mouth daily with breakfast.     finasteride (PROSCAR) 5 MG tablet Take 1 tablet by mouth daily 90 tablet 3   LUMIGAN 0.01 % SOLN Place 1 drop into the left eye daily.     metFORMIN (GLUCOPHAGE-XR) 500 MG 24 hr tablet Take 1 tablet by mouth twice a day 180 tablet 3   Multiple Vitamin (MULTIVITAMIN) tablet Take 1 tablet by mouth daily.     Nutritional Supplements (NUTRITIONAL SUPPLEMENT PO) Take by mouth. Curamin     omeprazole (PRILOSEC) 20 MG capsule TAKE 1 CAPSULE BY MOUTH EVERY DAY 90 capsule 3   OVER THE COUNTER MEDICATION HA Joint Formula: Vit C, Magnesium, Collagen, Quercetin, Olive Extract, Boswellia     Potassium Citrate 15 MEQ (1620 MG) TBCR Take 1 tablet by mouth 3 (three) times daily. 270 tablet 3   sildenafil (REVATIO) 20 MG tablet TAKE 3-5 TABLETS BY MOUTH EVERY DAY AS NEEDED 50 tablet 5   SIMBRINZA 1-0.2 % SUSP Place 1 drop into the left eye 2 (two) times daily.     timolol (TIMOPTIC) 0.5 % ophthalmic solution Place 1 drop into both eyes 2 (two) times daily.      Vitamin D3 (VITAMIN D) 25 MCG tablet Take 1 tablet by mouth 2 (two) times daily.     No current facility-administered medications on file prior to visit.    Allergies  Allergen Reactions   Lipitor [Atorvastatin] Other (See Comments)    myalgias   Lisinopril  Morphine And Codeine Other (See Comments)     hypotension   Levofloxacin Rash   Septra [Sulfamethoxazole-Trimethoprim] Rash   Sulfamethoxazole-Trimethoprim Rash    Past Medical History:  Diagnosis Date   BPH (benign prostatic hypertrophy)    Cancer (HCC)    melanoma   Coronary artery disease    History of kidney stones    History of melanoma excision OCT 2010   Hyperlipidemia    Hypertension    Left ureteral calculus    Nephrolithiasis    bilateral   Type 2 diabetes mellitus (HCC)     Past Surgical History:  Procedure Laterality Date    CARDIAC CATHETERIZATION     CHOLECYSTECTOMY  01/1999   CORNEAL TRANSPLANT Right 2023   CORONARY ARTERY BYPASS GRAFT N/A 11/28/2019   Procedure: CORONARY ARTERY BYPASS GRAFTING (CABG) x3, using left internal mammary artery and Saphenous vein harvested endoscopically;  Surgeon: Kerin Perna, MD;  Location: [facility] OR;  Service: Open Heart Surgery;  Laterality: N/A;   CYSTOSCOPY WITH STENT PLACEMENT Left 11/14/2012   Procedure: CYSTOSCOPY WITH STENT PLACEMENT;  Surgeon: Marcine Matar, MD;  Location: [facility];  Service: Urology;  Laterality: Left;   CYSTOSCOPY/RETROGRADE/URETEROSCOPY/STONE EXTRACTION WITH BASKET Left 11/14/2012   Procedure: CYSTOSCOPY/RETROGRADE/URETEROSCOPY/STONE EXTRACTION WITH BASKET;  Surgeon: Marcine Matar, MD;  Location: [facility];  Service: Urology;  Laterality: Left;   EXTRACORPOREAL SHOCK WAVE LITHOTRIPSY  01/1999   X2   HOLMIUM LASER APPLICATION Left 11/14/2012   Procedure: HOLMIUM LASER APPLICATION;  Surgeon: Marcine Matar, MD;  Location: [facility];  Service: Urology;  Laterality: Left;   LEFT HEART CATH AND CORONARY ANGIOGRAPHY N/A 11/27/2019   Procedure: LEFT HEART CATH AND CORONARY ANGIOGRAPHY;  Surgeon: Runell Gess, MD;  Location: [facility];  Service: Cardiovascular;  Laterality: N/A;   LEFT URETEROSCOPIC STONE EXTRACTION  02/22/2007   NEPHROLITHOTOMY Right 09/28/2010   percutaneous   ORIF CLAVICLE FRACTURE  2006   PROSTATE BIOPSY  1998   neg   SHOULDER ARTHROSCOPY  07/2013   Dr Sherlean Foot   TEE WITHOUT CARDIOVERSION N/A 11/28/2019   Procedure: TRANSESOPHAGEAL ECHOCARDIOGRAM (TEE);  Surgeon: Donata Clay, Theron Arista, MD;  Location: [facility] OR;  Service: Open Heart Surgery;  Laterality: N/A;   TONSILLECTOMY      Family History  Problem Relation Age of Onset   Hypertension Mother    Diabetes Father    Hypertension Father    Colon polyps Brother    Coronary artery disease Neg Hx    Cancer Neg Hx     Esophageal cancer Neg Hx    Stomach cancer Neg Hx    Rectal cancer Neg Hx     Social History   Socioeconomic History   Marital status: Married    Spouse name: Not on file   Number of children: 2   Years of education: Not on file   Highest education level: Not on file  Occupational History   Occupation: Optician, 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   Occupation: Buys very distressed houses and rehabilitates   Occupation: Does mediation  Tobacco Use   Smoking status: Never    Passive exposure: Never   Smokeless tobacco: Never  Substance and Sexual Activity   Alcohol use: No   Drug use: No   Sexual activity: Not on file  Other Topics Concern   Not on file  Social History Narrative   Has living will   Wife is health care POA---alternate is committee of physicians   Would accept  resuscitation---wouldn't accept prolonged ventilation   No tube feeds if cognitively unaware   Right handed   Social Determinants of Health   Financial Resource Strain: Low Risk  (02/21/2021)   Overall Financial Resource Strain (CARDIA)    Difficulty of Paying Living Expenses: Not hard at all  Food Insecurity: Not on file  Transportation Needs: Not on file  Physical Activity: Not on file  Stress: Not on file  Social Connections: Not on file  Intimate Partner Violence: Not on file   Review of Systems Appetite is fine Weight stable Sleeps well Wears seat belt Teeth are fine--keeps up with dentist Has been using dulcolax nightly --it really helps his bowels (discussed trying senna-s instead) Just went to derm yesterday--no issues now Voids okay--unless his bowels back up. Flow is okay--empties fine Still satisfied with sildenafil No sig back or joint pains    Objective:   Physical Exam Constitutional:      Appearance: Normal appearance.  HENT:     Mouth/Throat:     Pharynx: No oropharyngeal exudate or posterior oropharyngeal erythema.  Eyes:     Conjunctiva/sclera: Conjunctivae normal.      Pupils: Pupils are equal, round, and reactive to light.  Cardiovascular:     Rate and Rhythm: Normal rate and regular rhythm.     Pulses: Normal pulses.     Heart sounds: No murmur heard.    No gallop.  Pulmonary:     Effort: Pulmonary effort is normal.     Breath sounds: Normal breath sounds. No wheezing or rales.  Abdominal:     Palpations: Abdomen is soft.     Tenderness: There is no abdominal tenderness.  Musculoskeletal:     Cervical back: Neck supple.     Right lower leg: No edema.     Left lower leg: No edema.  Lymphadenopathy:     Cervical: No cervical adenopathy.  Skin:    Findings: No lesion or rash.     Comments: No foot lesions  Neurological:     General: No focal deficit present.     Mental Status: He is alert and oriented to person, place, and time.     Comments: Word naming --- 11/1 minute Recall 3/3 Normal sensation in feet  Psychiatric:        Mood and Affect: Mood normal.        Behavior: Behavior normal.            Assessment & Plan:

## 2023-01-31 NOTE — Progress Notes (Signed)
Hearing Screening - Comments:: Has hearing aids/amplifiers. Not wearing them today.  Vision Screening - Comments:: February 2024

## 2023-01-31 NOTE — Assessment & Plan Note (Signed)
Seems to be low burden Just on ASA 81mg 

## 2023-01-31 NOTE — Assessment & Plan Note (Signed)
I have personally reviewed the Medicare Annual Wellness questionnaire and have noted 1. The patient's medical and social history 2. Their use of alcohol, tobacco or illicit drugs 3. Their current medications and supplements 4. The patient's functional ability including ADL's, fall risks, home safety risks and hearing or visual             impairment. 5. Diet and physical activities 6. Evidence for depression or mood disorders  The patients weight, height, BMI and visual acuity have been recorded in the chart I have made referrals, counseling and provided education to the patient based review of the above and I have provided the pt with a written personalized care plan for preventive services.  I have provided you with a copy of your personalized plan for preventive services. Please take the time to review along with your updated medication list.  Done with cancer screening Discussed exercise--especially adding resistance work Update COVID/flu in the fall. Also RSV

## 2023-02-26 ENCOUNTER — Telehealth: Payer: Self-pay | Admitting: Internal Medicine

## 2023-02-26 ENCOUNTER — Telehealth: Payer: Self-pay | Admitting: Cardiology

## 2023-02-26 ENCOUNTER — Other Ambulatory Visit: Payer: Self-pay

## 2023-02-26 MED ORDER — ENTRESTO 24-26 MG PO TABS: 1.0000 | ORAL_TABLET | Freq: Two times a day (BID) | ORAL | 0 refills | Status: DC

## 2023-02-26 MED ORDER — POTASSIUM CITRATE ER 15 MEQ (1620 MG) PO TBCR: 1.0000 | EXTENDED_RELEASE_TABLET | Freq: Three times a day (TID) | ORAL | 3 refills | Status: DC

## 2023-02-26 NOTE — Telephone Encounter (Signed)
*  STAT* If patient is at the pharmacy, call can be transferred to refill team.   1. Which medications need to be refilled? (please list name of each medication and dose if known)   sacubitril-valsartan (ENTRESTO) 24-26 MG    4. Which pharmacy/location (including street and city if local pharmacy) is medication to be sent to? [facility] - [address], [place] - [address]     5. Do they need a 30 day or 90 day supply? 90    Pt scheduled with APP 05/16/23

## 2023-02-26 NOTE — Addendum Note (Signed)
Addended by: Eual Fines on: 02/26/2023 04:27 PM   Modules accepted: Orders

## 2023-02-26 NOTE — Telephone Encounter (Signed)
Pt's medication was sent to pt's pharmacy as requested. Confirmation received.  °

## 2023-02-26 NOTE — Telephone Encounter (Signed)
Patient came by and dropped off a paper for Dr. Alphonsus Sias. Placed in his box up front. Thank you!

## 2023-02-26 NOTE — Telephone Encounter (Signed)
Spoke to pt. I sent the potassium to [facility]. I advised him that his cardiologist will need to refill the [facility]. He stated he wanted Dr Alphonsus Sias to fill it. I again advised that this is one that he does not fill and needs to come from his cardiologist. He will call Dr Anne Fu office.

## 2023-03-01 ENCOUNTER — Encounter: Payer: PPO | Admitting: Pharmacist

## 2023-03-01 ENCOUNTER — Encounter: Payer: Self-pay | Admitting: Internal Medicine

## 2023-03-07 ENCOUNTER — Ambulatory Visit: Payer: PPO

## 2023-03-07 DIAGNOSIS — M2041 Other hammer toe(s) (acquired), right foot: Secondary | ICD-10-CM

## 2023-03-07 DIAGNOSIS — E1142 Type 2 diabetes mellitus with diabetic polyneuropathy: Secondary | ICD-10-CM

## 2023-03-07 NOTE — Progress Notes (Signed)
Patient presents to the office today for diabetic shoe and insole measuring.  Patient was measured with brannock device to determine size and width for 1 pair of extra depth shoes and foam casted for 3 pair of insoles.   Documentation of medical necessity will be sent to patient's treating diabetic doctor to verify and sign.   Patient's diabetic provider: Tillman Abide MD / Dr Stacie [facility]   Shoes and insoles will be ordered at that time and patient will be notified for an appointment for fitting when they arrive.   Shoe size (per patient): 9 Brannock measurement: 9 Patient shoe selection- Shoe choice:   653 Shoe size ordered: 9XWD  ABN and Financials signed  Addison Bailey Cped, CFo, CFm

## 2023-04-13 NOTE — Progress Notes (Addendum)
Shoes are in sent for prior auth to [facility] team Adv Addison Bailey CPed, Cfo, Cfm  11.5.24 called patient to set appt for fitting appt set 3:45 on 11/13   Prior auth received Auth# [id number]  Good 04/17/23- 07/16/2023

## 2023-04-23 ENCOUNTER — Encounter: Payer: Self-pay | Admitting: Podiatry

## 2023-04-23 ENCOUNTER — Ambulatory Visit: Payer: PPO | Admitting: Podiatry

## 2023-04-23 DIAGNOSIS — B351 Tinea unguium: Secondary | ICD-10-CM | POA: Diagnosis not present

## 2023-04-23 DIAGNOSIS — M79676 Pain in unspecified toe(s): Secondary | ICD-10-CM | POA: Diagnosis not present

## 2023-04-23 DIAGNOSIS — E1142 Type 2 diabetes mellitus with diabetic polyneuropathy: Secondary | ICD-10-CM

## 2023-04-28 NOTE — Progress Notes (Signed)
ANNUAL DIABETIC FOOT EXAM  Subjective: James Braun presents today annual diabetic foot exam.  Patient confirms h/o diabetes.  Patient denies any h/o foot wounds.  Patient has been diagnosed with neuropathy.  Risk factors: diabetes, HTN, CAD, CHF, CKD, hyperlipidemia, hyperlipemia.  Karie Schwalbe, MD is patient's PCP.  Past Medical History:  Diagnosis Date   BPH (benign prostatic hypertrophy)    Cancer (HCC)    melanoma   Coronary artery disease    History of kidney stones    History of melanoma excision OCT 2010   Hyperlipidemia    Hypertension    Left ureteral calculus    Nephrolithiasis    bilateral   Type 2 diabetes mellitus (HCC)    Patient Active Problem List   Diagnosis Date Noted   Porokeratosis 10/12/2022   HFmrEF (heart failure with mildly reduced ejection fraction) (HCC) 12/18/2021   Calculus of kidney 04/07/2021   Iron deficiency anemia due to chronic blood loss 09/27/2020   Statin myopathy 02/10/2020   Heart failure, systolic, chronic (HCC) 12/03/2019   PAF (paroxysmal atrial fibrillation) (HCC) 12/03/2019   S/P CABG x 3 11/28/2019   CAD in native artery 11/27/2019   GERD (gastroesophageal reflux disease) 06/30/2019   Anemia in chronic kidney disease (CKD) 09/05/2018   Counseling regarding advanced directives 02/13/2014   S/P arthroscopy of shoulder 08/05/2013   Onychomycosis 04/25/2013   Routine general medical examination at a health care facility 01/04/2011   BPH without obstruction/lower urinary tract symptoms 07/13/2010   Controlled type 2 diabetes mellitus with diabetic nephropathy (HCC) 01/11/2010   Hyperlipemia 01/11/2010   Essential hypertension, benign 01/11/2010   NEPHROLITHIASIS, HX OF 01/11/2010   Past Surgical History:  Procedure Laterality Date   CARDIAC CATHETERIZATION     CHOLECYSTECTOMY  01/1999   CORNEAL TRANSPLANT Right 2023   CORONARY ARTERY BYPASS GRAFT N/A 11/28/2019   Procedure: CORONARY ARTERY BYPASS GRAFTING  (CABG) x3, using left internal mammary artery and Saphenous vein harvested endoscopically;  Surgeon: Kerin Perna, MD;  Location: [facility] OR;  Service: Open Heart Surgery;  Laterality: N/A;   CYSTOSCOPY WITH STENT PLACEMENT Left 11/14/2012   Procedure: CYSTOSCOPY WITH STENT PLACEMENT;  Surgeon: Marcine Matar, MD;  Location[facility];  Service: Urology;  Laterality: Left;   CYSTOSCOPY/RETROGRADE/URETEROSCOPY/STONE EXTRACTION WITH BASKET Left 11/14/2012   Procedure: CYSTOSCOPY/RETROGRADE/URETEROSCOPY/STONE EXTRACTION WITH BASKET;  Surgeon: Marcine Matar, MD;  Location: [facility];  Service: Urology;  Laterality: Left;   EXTRACORPOREAL SHOCK WAVE LITHOTRIPSY  01/1999   X2   HOLMIUM LASER APPLICATION Left 11/14/2012   Procedure: HOLMIUM LASER APPLICATION;  Surgeon: Marcine Matar, MD;  Location: [facility];  Service: Urology;  Laterality: Left;   LEFT HEART CATH AND CORONARY ANGIOGRAPHY N/A 11/27/2019   Procedure: LEFT HEART CATH AND CORONARY ANGIOGRAPHY;  Surgeon: Runell Gess, MD;  Location: [facility];  Service: Cardiovascular;  Laterality: N/A;   LEFT URETEROSCOPIC STONE EXTRACTION  02/22/2007   NEPHROLITHOTOMY Right 09/28/2010   percutaneous   ORIF CLAVICLE FRACTURE  2006   PROSTATE BIOPSY  1998   neg   SHOULDER ARTHROSCOPY  07/2013   Dr Sherlean Foot   TEE WITHOUT CARDIOVERSION N/A 11/28/2019   Procedure: TRANSESOPHAGEAL ECHOCARDIOGRAM (TEE);  Surgeon: Donata Clay, Theron Arista, MD;  Location: [facility] OR;  Service: Open Heart Surgery;  Laterality: N/A;   TONSILLECTOMY     Current Outpatient Medications on File Prior to Visit  Medication Sig Dispense Refill   Acetaminophen (TYLENOL 8 HOUR ARTHRITIS  PAIN PO) Take by mouth.     alum hydroxide-mag trisilicate (GAVISCON) 80-20 MG CHEW chewable tablet Chew 2 tablets by mouth 3 (three) times daily as needed for indigestion or heartburn.     aspirin EC 81 MG tablet Take 1 tablet (81 mg total) by  mouth daily. Swallow whole. 90 tablet 3   Cyanocobalamin (VITAMIN B-12) 1000 MCG SUBL Place 1,000 mcg under the tongue. 1000 mcg SL daily     empagliflozin (JARDIANCE) 10 MG TABS tablet Take 1 tablet by mouth daily before breakfast 90 tablet 3   ferrous sulfate 325 (65 FE) MG tablet Take 325 mg by mouth daily with breakfast.     finasteride (PROSCAR) 5 MG tablet Take 1 tablet by mouth daily 90 tablet 3   LUMIGAN 0.01 % SOLN Place 1 drop into the left eye daily.     metFORMIN (GLUCOPHAGE-XR) 500 MG 24 hr tablet Take 1 tablet by mouth twice a day 180 tablet 3   Multiple Vitamin (MULTIVITAMIN) tablet Take 1 tablet by mouth daily.     Nutritional Supplements (NUTRITIONAL SUPPLEMENT PO) Take by mouth. Curamin     omeprazole (PRILOSEC) 20 MG capsule TAKE 1 CAPSULE BY MOUTH EVERY DAY 90 capsule 3   OVER THE COUNTER MEDICATION HA Joint Formula: Vit C, Magnesium, Collagen, Quercetin, Olive Extract, Boswellia     Potassium Citrate 15 MEQ (1620 MG) TBCR Take 1 tablet by mouth 3 (three) times daily. 270 tablet 3   sacubitril-valsartan (ENTRESTO) 24-26 MG Take 1 tablet by mouth 2 (two) times daily. 180 tablet 0   sildenafil (REVATIO) 20 MG tablet TAKE 3-5 TABLETS BY MOUTH EVERY DAY AS NEEDED 50 tablet 5   SIMBRINZA 1-0.2 % SUSP Place 1 drop into the left eye 2 (two) times daily.     timolol (TIMOPTIC) 0.5 % ophthalmic solution Place 1 drop into both eyes 2 (two) times daily.      Vitamin D3 (VITAMIN D) 25 MCG tablet Take 1 tablet by mouth 2 (two) times daily.     No current facility-administered medications on file prior to visit.    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   Occupation:  Buys very distressed houses and rehabilitates   Occupation: Does mediation  Tobacco Use   Smoking status: Never    Passive exposure: Never   Smokeless tobacco: Never  Substance and Sexual Activity   Alcohol use: No   Drug use: No   Sexual activity: Not on file   Family History  Problem Relation Age of Onset   Hypertension Mother    Diabetes Father    Hypertension Father    Colon polyps Brother    Coronary artery disease Neg Hx    Cancer Neg Hx    Esophageal cancer Neg Hx    Stomach cancer Neg Hx    Rectal cancer Neg Hx    Immunization History  Administered Date(s) Administered   Fluad Quad(high Dose 65+) 05/19/2020   Hepatitis A 06/17/2003, 02/15/2004   Hepatitis B 07/03/2012, 09/04/2012, 12/25/2012   Influenza Split 05/31/2011, 05/27/2012   Influenza Whole 05/17/2009   Influenza,inj,Quad PF,6+ Mos 05/28/2013, 05/26/2014, 06/04/2015, 05/29/2016, 05/17/2017, 05/30/2018, 05/29/2019   Influenza-Unspecified 05/18/2015, 05/31/2021, 05/19/2022  PFIZER(Purple Top)SARS-COV-2 Vaccination 07/26/2019, 08/16/2019, 05/01/2020   Pneumococcal Conjugate-13 02/13/2014   Pneumococcal Polysaccharide-23 02/14/2001, 02/20/2006, 03/01/2017   Td 11/05/1999, 01/11/2010   Tdap 12/22/2009, 03/25/2016   Zoster, Live 06/28/2005     Review of Systems: Negative except as noted in the HPI.   Objective: There were no vitals filed for this visit.  James Braun is a pleasant 82 y.o. male in NAD. AAO X 3.  Vascular Examination: Capillary refill time immediate b/l. Vascular status intact b/l with palpable pedal pulses. Pedal hair present b/l. No pain with calf compression b/l. Skin temperature gradient WNL b/l. No cyanosis or clubbing b/l. No ischemia or gangrene noted b/l.   Neurological Examination: Protective sensation intact 5/5 intact bilaterally with 10g monofilament b/l. Vibratory sensation intact b/l.  Dermatological Examination: Pedal skin with normal turgor, texture and tone b/l.  No  open wounds. No interdigital macerations.   Toenails 1-5 b/l thick, discolored, elongated with subungual debris and pain on dorsal palpation.   Hyperkeratotic lesion(s) submet head 5 b/l.  No erythema, no edema, no drainage, no fluctuance.  Musculoskeletal Examination: HAV with bunion bilaterally and hammertoes 2-5 b/l. Tailor's bunion deformity noted b/l LE.  Radiographs: None   Last A1c:      Latest Ref Rng & Units 01/24/2023    7:49 AM 07/25/2022    7:46 AM  Hemoglobin A1C  Hemoglobin-A1c 4.6 - 6.5 % 7.5  7.3    Lab Results  Component Value Date   HGBA1C 7.5 (H) 01/24/2023   ADA Risk Categorization: Low Risk :  Patient has all of the following: Intact protective sensation No prior foot ulcer  No severe deformity Pedal pulses present  Assessment: 1. Pain due to onychomycosis of toenail   2. Diabetic polyneuropathy associated with type 2 diabetes mellitus (HCC)     Plan: -Consent given for treatment as described below: -Examined patient. -Diabetic foot examination performed today. -Continue diabetic foot care principles: inspect feet daily, monitor glucose as recommended by PCP and/or Endocrinologist, and follow prescribed diet per PCP, Endocrinologist and/or dietician. -Patient to continue soft, supportive shoe gear daily. -Toenails 1-5 b/l were debrided in length and girth with sterile nail nippers and dremel without iatrogenic bleeding.  -Patient/POA to call should there be question/concern in the interim. Return in about 3 months (around 07/24/2023).  Freddie Breech, DPM

## 2023-04-30 ENCOUNTER — Telehealth: Payer: Self-pay | Admitting: Internal Medicine

## 2023-04-30 NOTE — Telephone Encounter (Signed)
Called and spoke to patient, advised him that it looks like Dr. Alphonsus Sias filled this paperwork out before but I was unable to find the completed copy scanned in his chart, only a blank one. I advised him we will print out the paperwork and place in Dr. Vassie Moselle box for him to complete upon returning to the office next week. Patient verbalized understanding.

## 2023-04-30 NOTE — Telephone Encounter (Signed)
Patient contacted the office regarding forms for diabetic shoes from [facility]. Patient has been told that [facility] has not received a response regarding his shoes, patient asked for a call back regarding this, can be reached at cell number [phone]

## 2023-05-09 DIAGNOSIS — H353131 Nonexudative age-related macular degeneration, bilateral, early dry stage: Secondary | ICD-10-CM | POA: Diagnosis not present

## 2023-05-09 DIAGNOSIS — H2512 Age-related nuclear cataract, left eye: Secondary | ICD-10-CM | POA: Diagnosis not present

## 2023-05-09 DIAGNOSIS — H401122 Primary open-angle glaucoma, left eye, moderate stage: Secondary | ICD-10-CM | POA: Diagnosis not present

## 2023-05-10 ENCOUNTER — Other Ambulatory Visit: Payer: Self-pay | Admitting: Internal Medicine

## 2023-05-16 ENCOUNTER — Encounter: Payer: Self-pay | Admitting: Physician Assistant

## 2023-05-16 ENCOUNTER — Ambulatory Visit: Payer: PPO | Attending: Physician Assistant | Admitting: Physician Assistant

## 2023-05-16 VITALS — BP 164/86 | HR 95 | Ht 65.0 in | Wt 147.8 lb

## 2023-05-16 DIAGNOSIS — I2510 Atherosclerotic heart disease of native coronary artery without angina pectoris: Secondary | ICD-10-CM | POA: Diagnosis not present

## 2023-05-16 DIAGNOSIS — I10 Essential (primary) hypertension: Secondary | ICD-10-CM | POA: Diagnosis not present

## 2023-05-16 DIAGNOSIS — I5020 Unspecified systolic (congestive) heart failure: Secondary | ICD-10-CM

## 2023-05-16 DIAGNOSIS — E782 Mixed hyperlipidemia: Secondary | ICD-10-CM | POA: Diagnosis not present

## 2023-05-16 MED ORDER — METOPROLOL SUCCINATE ER 50 MG PO TB24: 50.0000 mg | ORAL_TABLET | Freq: Every day | ORAL | 3 refills | Status: DC

## 2023-05-16 NOTE — Assessment & Plan Note (Signed)
S/p CABG in 2021.  No current symptoms of angina. Patient is intolerant to statins. -Continue Aspirin 80mg  daily.

## 2023-05-16 NOTE — Assessment & Plan Note (Signed)
Ischemic cardiomyopathy.  NYHA II.  Volume status stable.  EF as low as 35% in May 2021.  Ejection fraction improved to 45-50% in November 2021. Patient stopped Carvedilol due to side effects in the past.  His blood pressure and heart rate are elevated today. Discussed the importance of beta blocker therapy in addition to ARNi and SGLT2 inhibitor therapy for management of heart failure. -Start Metoprolol succinate 50mg  daily. -Continue Entresto 24/26mg  twice daily and Jardiance 10mg  daily. -Follow-up 6 months

## 2023-05-16 NOTE — Patient Instructions (Signed)
Medication Instructions:  Your physician has recommended you make the following change in your medication:   START  Toprol Xl 50 taking 1 daily  *If you need a refill on your cardiac medications before your next appointment, please call your pharmacy*   Lab Work: None ordered  If you have labs (blood work) drawn today and your tests are completely normal, you will receive your results only by: MyChart Message (if you have MyChart) OR A paper copy in the mail If you have any lab test that is abnormal or we need to change your treatment, we will call you to review the results.   Testing/Procedures: None ordered   Follow-Up: At [facility], you and your health needs are our priority.  As part of our continuing mission to provide you with exceptional heart care, we have created designated Provider Care Teams.  These Care Teams include your primary Cardiologist (physician) and Advanced Practice Providers (APPs -  Physician Assistants and Nurse Practitioners) who all work together to provide you with the care you need, when you need it.  We recommend signing up for the patient portal called "MyChart".  Sign up information is provided on this After Visit Summary.  MyChart is used to connect with patients for Virtual Visits (Telemedicine).  Patients are able to view lab/test results, encounter notes, upcoming appointments, etc.  Non-urgent messages can be sent to your provider as well.   To learn more about what you can do with MyChart, go to [email or web address].    Your next appointment:   6 month(s)  Provider:   Tereso Newcomer, PA-C  Other Instructions

## 2023-05-16 NOTE — Assessment & Plan Note (Signed)
LDL was 127 in July. Patient is intolerant to statins and has declined PCSK9 inhibitors and siRNA agents. -No changes to current management.

## 2023-05-16 NOTE — Assessment & Plan Note (Signed)
Blood pressure is uncontrolled. -Start Metoprolol succinate 50mg  daily. -Continue Entresto 24/26mg  twice daily.

## 2023-05-16 NOTE — Progress Notes (Signed)
Cardiology Office Note:    Date:  05/16/2023  ID:  James Braun, DOB Feb 06, 1941, MRN [id number] PCP: Karie Schwalbe, MD  [facility] Providers Cardiologist:  Donato Schultz, MD Advanced Heart Failure:  Arvilla Meres, MD       Patient Profile:      Coronary artery disease S/p CABG in May 2021 (LIMA-LAD, SVG-DX, SVG-PL) Postoperative atrial fibrillation HFmrEF (heart failure with mildly reduced ejection fraction)  Ischemic cardiomyopathy Echo May 2021: EF 35 TTE 05/19/2020:EF 45-50, GR 1 DD, normal RVSF, mild MR inferior HK  Entresto DC'd in March 2023 secondary to dizziness Hypertension Hyperlipidemia Diabetes mellitus Carotid stenosis [place] 06/29/2020: Bilateral ICA 1-39 Chronic dizziness Prior neuro eval Hx melanoma BPH          History of Present Illness:  Discussed the use of AI scribe software for clinical note transcription with the patient, who gave verbal consent to proceed.  James Braun is a 82 y.o. male who returns for follow-up of CHF, CAD.  He was last seen in June 2023.  He is here alone.  He reports no new symptoms or concerns, denying chest pain, shortness of breath, leg swelling, or trouble breathing when lying flat.  He has not had further dizziness.  He denies any bleeding, black stools, or bloody urine.  He stopped carvedilol due to side effects.  He is unsure what side effects he experienced.  He has not been able to tolerate rosuvastatin or atorvastatin in the past.    Review of Systems  Neurological:  Negative for dizziness.  See HPI     Studies Reviewed:   EKG Interpretation Date/Time:  Wednesday May 16 2023 15:39:24 EDT Ventricular Rate:  95 PR Interval:  176 QRS Duration:  112 QT Interval:  366 QTC Calculation: 459 R Axis:   82  Text Interpretation: Normal sinus rhythm with sinus arrhythmia Nonspecific ST and T wave abnormality Normal axis No significant change since last tracing Confirmed by Tereso Newcomer [phone]) on  05/16/2023 4:00:56 PM    Results   LABS-chart review Total cholesterol: 197 (01/24/2023) HDL: 33.7 (01/24/2023) LDL: 127 (01/24/2023) Triglycerides: 180 (01/24/2023) A1c: 7.5 (01/24/2023) Hb: 14.5 (01/24/2023) Cr: 0.98 (01/24/2023) K: 3.5 (01/24/2023) ALT: 8 (01/24/2023) TSH: 0.95 (01/24/2023)       Risk Assessment/Calculations:     HYPERTENSION CONTROL Vitals:   05/16/23 1525 05/16/23 1634  BP: (!) 141/90 (!) 164/86    The patient's blood pressure is elevated above target today.  In order to address the patient's elevated BP: A new medication was prescribed today.          Physical Exam:   VS:  BP (!) 164/86   Pulse 95   Ht 5\' 5"  (1.651 m)   Wt 147 lb 12.8 oz (67 kg)   SpO2 94%   BMI 24.60 kg/m    Wt Readings from Last 3 Encounters:  05/16/23 147 lb 12.8 oz (67 kg)  01/31/23 144 lb (65.3 kg)  08/01/22 149 lb (67.6 kg)    Constitutional:      Appearance: Healthy appearance. Not in distress.  Neck:     Vascular: JVD normal.  Pulmonary:     Breath sounds: Normal breath sounds. No wheezing. No rales.  Cardiovascular:     Normal rate. Regular rhythm.     Murmurs: There is no murmur.  Edema:    Peripheral edema absent.  Abdominal:     Palpations: Abdomen is soft.  Assessment and Plan:   Assessment & Plan CAD in native artery S/p CABG in 2021.  No current symptoms of angina. Patient is intolerant to statins. -Continue Aspirin 80mg  daily. HFmrEF (heart failure with mildly reduced ejection fraction) (HCC) Ischemic cardiomyopathy.  NYHA II.  Volume status stable.  EF as low as 35% in May 2021.  Ejection fraction improved to 45-50% in November 2021. Patient stopped Carvedilol due to side effects in the past.  His blood pressure and heart rate are elevated today. Discussed the importance of beta blocker therapy in addition to ARNi and SGLT2 inhibitor therapy for management of heart failure. -Start Metoprolol succinate 50mg  daily. -Continue Entresto  24/26mg  twice daily and Jardiance 10mg  daily. -Follow-up 6 months Essential hypertension, benign Blood pressure is uncontrolled. -Start Metoprolol succinate 50mg  daily. -Continue Entresto 24/26mg  twice daily. Mixed hyperlipidemia LDL was 127 in July. Patient is intolerant to statins and has declined PCSK9 inhibitors and siRNA agents. -No changes to current management.          Dispo:  Return in about 6 months (around 11/14/2023) for Routine Follow Up, w/ Tereso Newcomer, PA-C.  Signed, Tereso Newcomer, PA-C

## 2023-05-17 ENCOUNTER — Telehealth: Payer: Self-pay | Admitting: Cardiology

## 2023-05-17 MED ORDER — ENTRESTO 24-26 MG PO TABS: 1.0000 | ORAL_TABLET | Freq: Two times a day (BID) | ORAL | 3 refills | Status: DC

## 2023-05-17 NOTE — Telephone Encounter (Signed)
Pt c/o medication issue:  1. Name of Medication:   sacubitril-valsartan (ENTRESTO) 24-26 MG    2. How are you currently taking this medication (dosage and times per day)?  Take 1 tablet by mouth 2 (two) times daily.       3. Are you having a reaction (difficulty breathing--STAT)? No  4. What is your medication issue? Pt stated this prescription can't be released from [facility] pharmacy unless a provider calls to release it. The provider line is [phone] and the order number is [id number]. Please advise

## 2023-05-17 NOTE — Telephone Encounter (Signed)
Entresto prescription had expired.  Refill sent into [facility]  Attempted to contact pt X 2 to notify him and line is busy.

## 2023-05-17 NOTE — Telephone Encounter (Signed)
*  STAT* If patient is at the pharmacy, call can be transferred to refill team.   1. Which medications need to be refilled? (please list name of each medication and dose if known)   sacubitril-valsartan (ENTRESTO) 24-26 MG   2. Would you like to learn more about the convenience, safety, & potential cost savings by using the [facility]?   3. Are you open to using the [facility] (Type [facility]. ).  4. Which pharmacy/location (including street and city if local pharmacy) is medication to be sent to?  [facility], [place] - [address]   5. Do they need a 30 day or 90 day supply?   90 day  Caller (Alina) did not know if patient had any medication left.

## 2023-05-18 NOTE — Telephone Encounter (Signed)
Spoke with pt who is aware James Braun was refilled as requested.  He was appreciative of the information.

## 2023-05-30 ENCOUNTER — Ambulatory Visit (INDEPENDENT_AMBULATORY_CARE_PROVIDER_SITE_OTHER): Payer: PPO

## 2023-05-30 DIAGNOSIS — Q828 Other specified congenital malformations of skin: Secondary | ICD-10-CM | POA: Diagnosis not present

## 2023-05-30 DIAGNOSIS — M2041 Other hammer toe(s) (acquired), right foot: Secondary | ICD-10-CM

## 2023-05-30 DIAGNOSIS — M2042 Other hammer toe(s) (acquired), left foot: Secondary | ICD-10-CM | POA: Diagnosis not present

## 2023-05-30 DIAGNOSIS — E1142 Type 2 diabetes mellitus with diabetic polyneuropathy: Secondary | ICD-10-CM | POA: Diagnosis not present

## 2023-06-06 NOTE — Progress Notes (Signed)

## 2023-07-20 ENCOUNTER — Telehealth: Payer: Self-pay

## 2023-07-20 MED ORDER — EMPAGLIFLOZIN 10 MG PO TABS: 10.0000 mg | ORAL_TABLET | Freq: Every day | ORAL | 3 refills | Status: DC

## 2023-07-20 NOTE — Telephone Encounter (Signed)
 Rx sent electronically.

## 2023-07-25 ENCOUNTER — Other Ambulatory Visit (HOSPITAL_COMMUNITY): Payer: Self-pay

## 2023-07-25 ENCOUNTER — Telehealth: Payer: Self-pay

## 2023-07-25 ENCOUNTER — Other Ambulatory Visit: Payer: Self-pay

## 2023-07-25 ENCOUNTER — Encounter: Payer: Self-pay | Admitting: Hematology

## 2023-07-25 MED ORDER — METFORMIN HCL ER 500 MG PO TB24: 500.0000 mg | ORAL_TABLET | Freq: Two times a day (BID) | ORAL | 1 refills | Status: DC

## 2023-07-25 NOTE — Telephone Encounter (Signed)
 Copied from CRM [id number]. Topic: Clinical - Medication Refill >> Jul 25, 2023  9:49 AM Franky GRADE wrote: Most Recent Primary Care Visit:  Provider: JIMMY ADE I  Department: [facility]  Visit Type: PHYSICAL  Date: 01/31/2023  Medication: metFORMIN  (GLUCOPHAGE -XR) 500 MG 24 hr tablet [  Has the patient contacted their pharmacy? No, patient is switching pharmacies and is requesting that the medication be sent to [facility]. (Agent: If no, request that the patient contact the pharmacy for the refill. If patient does not wish to contact the pharmacy document the reason why and proceed with request.) (Agent: If yes, when and what did the pharmacy advise?)  Is this the correct pharmacy for this prescription? No If no, delete pharmacy and type the correct one.  This is the patient's preferred pharmacy:  [facility], [place] - [address] [place] [address] [place] [address] Phone: [phone] Fax: [phone]  [facility] [phone] - [place], [place] - [address] [place] [address] Phone: [phone] Fax: [phone]  [facility] - [place], [place] - [address] [place] [address] Phone: [phone] Fax: [phone]   Has the prescription been filled recently? No  Is the patient out of the medication? No  Has the patient been seen for an appointment in the last year OR does the patient have an upcoming appointment? Yes  Can we respond through MyChart? Yes  Agent: Please be advised that Rx refills may take up to 3 business days. We ask that you follow-up with your pharmacy.

## 2023-07-25 NOTE — Telephone Encounter (Signed)
 ERX sent.

## 2023-07-26 ENCOUNTER — Encounter: Payer: Self-pay | Admitting: Podiatry

## 2023-07-26 ENCOUNTER — Ambulatory Visit: Payer: PPO | Admitting: Podiatry

## 2023-07-26 VITALS — Ht 65.0 in | Wt 147.8 lb

## 2023-07-26 DIAGNOSIS — E1142 Type 2 diabetes mellitus with diabetic polyneuropathy: Secondary | ICD-10-CM

## 2023-07-26 DIAGNOSIS — M79676 Pain in unspecified toe(s): Secondary | ICD-10-CM

## 2023-07-26 DIAGNOSIS — B351 Tinea unguium: Secondary | ICD-10-CM

## 2023-07-26 DIAGNOSIS — Q828 Other specified congenital malformations of skin: Secondary | ICD-10-CM

## 2023-08-03 NOTE — Progress Notes (Signed)
  Subjective:  Patient ID: James Braun, male    DOB: 1940-11-26,  MRN: [id number]  83 y.o. male presents to clinic with  at risk foot care with history of diabetic neuropathy and painful porokeratotic lesion(s) of both feet and painful mycotic toenails that limit ambulation. Painful toenails interfere with ambulation. Aggravating factors include wearing enclosed shoe gear. Pain is relieved with periodic professional debridement. Painful porokeratotic lesions are aggravated when weightbearing with and without shoegear. Pain is relieved with periodic professional debridement.  Chief Complaint  Patient presents with   Nail Problem    Pt is here for [facility] last A1C was 7.3 PCP is Dr Alphonsus Sias and LOV was in August.     New problem(s): None   PCP is Karie Schwalbe, MD.  Allergies  Allergen Reactions   Lipitor [Atorvastatin] Other (See Comments)    myalgias   Lisinopril    Morphine And Codeine Other (See Comments)     hypotension   Levofloxacin Rash   Septra [Sulfamethoxazole-Trimethoprim] Rash   Sulfamethoxazole Rash   Sulfamethoxazole-Trimethoprim Rash    Review of Systems: Negative except as noted in the HPI.   Objective:  James Braun is a pleasant 83 y.o. male WD, WN in NAD.Marland Kitchen AAO x 3.  Vascular Examination: Vascular status intact b/l with palpable pedal pulses. CFT immediate b/l. No edema. No pain with calf compression b/l. Skin temperature gradient WNL b/l.   Neurological Examination: Sensation grossly intact b/l with 10 gram monofilament. Vibratory sensation intact b/l. Pt has subjective symptoms of neuropathy.  Dermatological Examination: Pedal skin with normal turgor, texture and tone b/l. Toenails 1-5 b/l thick, discolored, elongated with subungual debris and pain on dorsal palpation. Porokeratotic lesion(s) submet head 5 b/l. No erythema, no edema, no drainage, no fluctuance.  Musculoskeletal Examination: Muscle strength 5/5 to b/l LE. HAV with bunion bilaterally and  hammertoes 2-5 b/l.  Radiographs: None  Last A1c:      Latest Ref Rng & Units 01/24/2023    7:49 AM  Hemoglobin A1C  Hemoglobin-A1c 4.6 - 6.5 % 7.5      Assessment:   1. Pain due to onychomycosis of toenail   2. Porokeratosis   3. Diabetic polyneuropathy associated with type 2 diabetes mellitus (HCC)    Plan:  -Patient was evaluated today. All questions/concerns addressed on today's visit. -Patient to continue soft, supportive shoe gear daily. -Mycotic toenails 1-5 bilaterally were debrided in length and girth with sterile nail nippers and dremel without incident. -Patient/POA to call should there be question/concern in the interim.  Return in about 3 months (around 10/24/2023).  Freddie Breech, DPM       LOCATION: [address] [place] [address]                   Office [phone]   [facility] LOCATION: [address] [place], [place] [address] Office [phone]

## 2023-08-06 ENCOUNTER — Ambulatory Visit (INDEPENDENT_AMBULATORY_CARE_PROVIDER_SITE_OTHER): Payer: PPO | Admitting: Internal Medicine

## 2023-08-06 ENCOUNTER — Encounter: Payer: Self-pay | Admitting: Internal Medicine

## 2023-08-06 VITALS — BP 110/60 | HR 73 | Ht 65.0 in | Wt 150.0 lb

## 2023-08-06 DIAGNOSIS — Z7984 Long term (current) use of oral hypoglycemic drugs: Secondary | ICD-10-CM | POA: Diagnosis not present

## 2023-08-06 DIAGNOSIS — E1121 Type 2 diabetes mellitus with diabetic nephropathy: Secondary | ICD-10-CM

## 2023-08-06 DIAGNOSIS — I10 Essential (primary) hypertension: Secondary | ICD-10-CM

## 2023-08-06 DIAGNOSIS — E119 Type 2 diabetes mellitus without complications: Secondary | ICD-10-CM | POA: Diagnosis not present

## 2023-08-06 DIAGNOSIS — I480 Paroxysmal atrial fibrillation: Secondary | ICD-10-CM

## 2023-08-06 DIAGNOSIS — I5020 Unspecified systolic (congestive) heart failure: Secondary | ICD-10-CM | POA: Diagnosis not present

## 2023-08-06 LAB — POCT GLYCOSYLATED HEMOGLOBIN (HGB A1C): Hemoglobin A1C: 7.4 % — AB (ref 4.0–5.6)

## 2023-08-06 NOTE — Assessment & Plan Note (Signed)
A1c down slightly to 7.4% On jardiance10 and metformin 500 bid CKD has improved on the jardiance

## 2023-08-06 NOTE — Progress Notes (Signed)
Subjective:    Patient ID: James Braun, male    DOB: March 25, 1941, 83 y.o.   MRN: [id number]  HPI Here for follow up of diabetes and other chronic health conditions  Doing better in the past few months Feeling optimistic Trying to exercise more--mostly walking Was started on metoprolol 50mg  in October by cardiology PA---made him dizzy so stopped it after a week Had been on carvedilol in the past --doesn't remember what problem he had with that Hasn't been taking his BP at home No chest pain or SOB Sleeps flat in bed--no PND No decrease in exercise tolerance No palpitaitons  Hasn't been checking sugars No leg burning or tingling  Current Outpatient Medications on File Prior to Visit  Medication Sig Dispense Refill   Acetaminophen (TYLENOL 8 HOUR ARTHRITIS PAIN PO) Take by mouth.     alum hydroxide-mag trisilicate (GAVISCON) 80-20 MG CHEW chewable tablet Chew 2 tablets by mouth 3 (three) times daily as needed for indigestion or heartburn.     aspirin EC 81 MG tablet Take 1 tablet (81 mg total) by mouth daily. Swallow whole. 90 tablet 3   Cyanocobalamin (VITAMIN B-12) 1000 MCG SUBL Place 1,000 mcg under the tongue. 1000 mcg SL daily     empagliflozin (JARDIANCE) 10 MG TABS tablet Take 1 tablet (10 mg total) by mouth daily before breakfast. 90 tablet 3   ferrous sulfate 325 (65 FE) MG tablet Take 325 mg by mouth daily with breakfast.     finasteride (PROSCAR) 5 MG tablet Take 1 tablet by mouth daily 90 tablet 3   LUMIGAN 0.01 % SOLN Place 1 drop into the left eye daily.     metFORMIN (GLUCOPHAGE-XR) 500 MG 24 hr tablet Take 1 tablet (500 mg total) by mouth 2 (two) times daily. 180 tablet 1   Multiple Vitamin (MULTIVITAMIN) tablet Take 1 tablet by mouth daily.     Nutritional Supplements (NUTRITIONAL SUPPLEMENT PO) Take by mouth. Curamin     omeprazole (PRILOSEC) 20 MG capsule TAKE 1 CAPSULE BY MOUTH EVERY DAY 90 capsule 2   OVER THE COUNTER MEDICATION HA Joint Formula: Vit C,  Magnesium, Collagen, Quercetin, Olive Extract, Boswellia     OVER THE COUNTER MEDICATION Joint Supplement     Potassium Citrate 15 MEQ (1620 MG) TBCR Take 1 tablet by mouth 3 (three) times daily. 270 tablet 3   sacubitril-valsartan (ENTRESTO) 24-26 MG Take 1 tablet by mouth 2 (two) times daily. 180 tablet 3   sildenafil (REVATIO) 20 MG tablet TAKE 3-5 TABLETS BY MOUTH EVERY DAY AS NEEDED 50 tablet 5   SIMBRINZA 1-0.2 % SUSP Place 1 drop into the left eye 2 (two) times daily.     timolol (TIMOPTIC) 0.5 % ophthalmic solution Place 1 drop into both eyes 2 (two) times daily.      Turmeric, Curcuma Longa, (CURCUMIN) POWD by Does not apply route.     Vitamin D3 (VITAMIN D) 25 MCG tablet Take 1 tablet by mouth 2 (two) times daily.     No current facility-administered medications on file prior to visit.    Allergies  Allergen Reactions   Lipitor [Atorvastatin] Other (See Comments)    myalgias   Lisinopril    Morphine And Codeine Other (See Comments)     hypotension   Levofloxacin Rash   Septra [Sulfamethoxazole-Trimethoprim] Rash   Sulfamethoxazole Rash   Sulfamethoxazole-Trimethoprim Rash    Past Medical History:  Diagnosis Date   BPH (benign prostatic hypertrophy)    Cancer (HCC)  melanoma   Coronary artery disease    History of kidney stones    History of melanoma excision OCT 2010   Hyperlipidemia    Hypertension    Left ureteral calculus    Nephrolithiasis    bilateral   Type 2 diabetes mellitus (HCC)     Past Surgical History:  Procedure Laterality Date   CARDIAC CATHETERIZATION     CHOLECYSTECTOMY  01/1999   CORNEAL TRANSPLANT Right 2023   CORONARY ARTERY BYPASS GRAFT N/A 11/28/2019   Procedure: CORONARY ARTERY BYPASS GRAFTING (CABG) x3, using left internal mammary artery and Saphenous vein harvested endoscopically;  Surgeon: Kerin Perna, MD;  Location: [facility] OR;  Service: Open Heart Surgery;  Laterality: N/A;   CYSTOSCOPY WITH STENT PLACEMENT Left 11/14/2012    Procedure: CYSTOSCOPY WITH STENT PLACEMENT;  Surgeon: Marcine Matar, MD;  Location: [facility]  Service: Urology;  Laterality: Left;   CYSTOSCOPY/RETROGRADE/URETEROSCOPY/STONE EXTRACTION WITH BASKET Left 11/14/2012   Procedure: CYSTOSCOPY/RETROGRADE/URETEROSCOPY/STONE EXTRACTION WITH BASKET;  Surgeon: Marcine Matar, MD;  Location: [facility];  Service: Urology;  Laterality: Left;   EXTRACORPOREAL SHOCK WAVE LITHOTRIPSY  01/1999   X2   HOLMIUM LASER APPLICATION Left 11/14/2012   Procedure: HOLMIUM LASER APPLICATION;  Surgeon: Marcine Matar, MD;  Location: [facility];  Service: Urology;  Laterality: Left;   LEFT HEART CATH AND CORONARY ANGIOGRAPHY N/A 11/27/2019   Procedure: LEFT HEART CATH AND CORONARY ANGIOGRAPHY;  Surgeon: Runell Gess, MD;  Location: [facility] INVASIVE [facility] LAB;  Service: Cardiovascular;  Laterality: N/A;   LEFT URETEROSCOPIC STONE EXTRACTION  02/22/2007   NEPHROLITHOTOMY Right 09/28/2010   percutaneous   ORIF CLAVICLE FRACTURE  2006   PROSTATE BIOPSY  1998   neg   SHOULDER ARTHROSCOPY  07/2013   Dr Sherlean Foot   TEE WITHOUT CARDIOVERSION N/A 11/28/2019   Procedure: TRANSESOPHAGEAL ECHOCARDIOGRAM (TEE);  Surgeon: Donata Clay, Theron Arista, MD;  Location[facility] OR;  Service: Open Heart Surgery;  Laterality: N/A;   TONSILLECTOMY      Family History  Problem Relation Age of Onset   Hypertension Mother    Diabetes Father    Hypertension Father    Colon polyps Brother    Coronary artery disease Neg Hx    Cancer Neg Hx    Esophageal cancer Neg Hx    Stomach cancer Neg Hx    Rectal cancer Neg Hx     Social History   Socioeconomic History   Marital status: Married    Spouse name: Not on file   Number of children: 2   Years of education: Not on file   Highest education level: Master's degree (e.g., MA, MS, MEng, MEd, MSW, MBA)  Occupational History   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   Occupation: Buys very  distressed houses and rehabilitates   Occupation: Does mediation  Tobacco Use   Smoking status: Never    Passive exposure: Never   Smokeless tobacco: Never  Substance and Sexual Activity   Alcohol use: No   Drug use: No   Sexual activity: Not on file  Other Topics Concern   Not on file  Social History Narrative   Has living will   Wife is health care POA---alternate is committee of physicians   Would accept resuscitation---wouldn't accept prolonged ventilation   No tube feeds if cognitively unaware   Right handed   Social Drivers of Health   Financial Resource Strain: Low Risk  (08/02/2023)   Overall Financial Resource Strain (CARDIA)  Difficulty of Paying Living Expenses: Not hard at all  Food Insecurity: No Food Insecurity (08/02/2023)   Hunger Vital Sign    Worried About Running Out of Food in the Last Year: Never true    Ran Out of Food in the Last Year: Never true  Transportation Needs: No Transportation Needs (08/02/2023)   PRAPARE - Administrator, Civil Service (Medical): No    Lack of Transportation (Non-Medical): No  Physical Activity: Insufficiently Active (08/02/2023)   Exercise Vital Sign    Days of Exercise per Week: 5 days    Minutes of Exercise per Session: 20 min  Stress: No Stress Concern Present (08/02/2023)   [employer] of Occupational Health - Occupational Stress Questionnaire    Feeling of Stress : Only a little  Social Connections: Socially Integrated (08/02/2023)   Social Connection and Isolation Panel [NHANES]    Frequency of Communication with Friends and Family: Twice a week    Frequency of Social Gatherings with Friends and Family: Twice a week    Attends Religious Services: More than 4 times per year    Active Member of [employer] or Organizations: Yes    Attends Engineer, structural: More than 4 times per year    Marital Status: Married  Catering manager Violence: Not on file   Review of Systems Sleeping okay Appetite  is improved Weight is stable    Objective:   Physical Exam Constitutional:      Appearance: Normal appearance.  Cardiovascular:     Rate and Rhythm: Normal rate and regular rhythm.     Pulses: Normal pulses.     Heart sounds:     No gallop.     Comments: Soft systolic murmur at base Pulmonary:     Effort: Pulmonary effort is normal.     Breath sounds: Normal breath sounds. No wheezing or rales.  Musculoskeletal:     Cervical back: Neck supple.     Right lower leg: No edema.     Left lower leg: No edema.  Lymphadenopathy:     Cervical: No cervical adenopathy.  Neurological:     Mental Status: He is alert.  Psychiatric:        Mood and Affect: Mood normal.        Behavior: Behavior normal.            Assessment & Plan:

## 2023-08-06 NOTE — Addendum Note (Signed)
Addended by: Eual Fines on: 08/06/2023 10:42 AM   Modules accepted: Orders

## 2023-08-06 NOTE — Assessment & Plan Note (Addendum)
Compensated with entresto 24/26 bid and jardiance Follows with cardiology

## 2023-08-06 NOTE — Assessment & Plan Note (Signed)
Low burden Rate now in 70's Didn't tolerate metoprolol--though started at 50mg  daily. If tachycardia noted again, could consider much lower dose (like 12.5mg  daily) ASA only

## 2023-08-06 NOTE — Assessment & Plan Note (Signed)
BP Readings from Last 3 Encounters:  08/06/23 110/60  05/16/23 (!) 164/86  01/31/23 136/82   High at last cardiology visit but not here Continue just the valsartan in entresto

## 2023-10-04 ENCOUNTER — Encounter: Payer: Self-pay | Admitting: Podiatry

## 2023-10-04 ENCOUNTER — Ambulatory Visit (INDEPENDENT_AMBULATORY_CARE_PROVIDER_SITE_OTHER): Payer: PPO | Admitting: Podiatry

## 2023-10-04 DIAGNOSIS — B351 Tinea unguium: Secondary | ICD-10-CM | POA: Diagnosis not present

## 2023-10-04 DIAGNOSIS — E1142 Type 2 diabetes mellitus with diabetic polyneuropathy: Secondary | ICD-10-CM

## 2023-10-04 DIAGNOSIS — M79676 Pain in unspecified toe(s): Secondary | ICD-10-CM | POA: Diagnosis not present

## 2023-10-04 DIAGNOSIS — Q828 Other specified congenital malformations of skin: Secondary | ICD-10-CM | POA: Diagnosis not present

## 2023-10-08 ENCOUNTER — Other Ambulatory Visit: Payer: Self-pay | Admitting: Internal Medicine

## 2023-10-11 NOTE — Progress Notes (Signed)
  Subjective:  Patient ID: James Braun, male    DOB: October 01, 1940,  MRN: [id number]  83 y.o. male presents to clinic with  at risk foot care with history of diabetic neuropathy and painful porokeratotic lesion(s) b/l feet and painful mycotic toenails that limit ambulation. Painful toenails interfere with ambulation. Aggravating factors include wearing enclosed shoe gear. Pain is relieved with periodic professional debridement. Painful porokeratotic lesions are aggravated when weightbearing with and without shoegear. Pain is relieved with periodic professional debridement.   New problem(s): None   PCP is Karie Schwalbe, MD. Theron Arista 08/06/2023.  Allergies  Allergen Reactions   Lipitor [Atorvastatin] Other (See Comments)    myalgias   Lisinopril    Morphine And Codeine Other (See Comments)     hypotension   Levofloxacin Rash   Septra [Sulfamethoxazole-Trimethoprim] Rash   Sulfamethoxazole Rash   Sulfamethoxazole-Trimethoprim Rash    Review of Systems: Negative except as noted in the HPI.   Objective:  James Braun is a pleasant 83 y.o. male WD, WN in NAD. AAO x 3.  Vascular Examination: Vascular status intact b/l with palpable pedal pulses. CFT immediate b/l. No edema. No pain with calf compression b/l. Skin temperature gradient WNL b/l. No cyanosis or clubbing noted b/l LE.  Neurological Examination: Pt has subjective symptoms of neuropathy.Sensation grossly intact b/l with 10 gram monofilament. Vibratory sensation intact b/l.   Dermatological Examination: Pedal skin with normal turgor, texture and tone b/l. Toenails 1-5 b/l thick, discolored, elongated with subungual debris and pain on dorsal palpation. Porokeratotic lesion(s) submet head 5 b/l. No erythema, no edema, no drainage, no fluctuance.  Musculoskeletal Examination: Muscle strength 5/5 to b/l LE. HAV with bunion deformity noted b/l LE. Hammertoe deformity noted 2-5 b/l.  Radiographs: None  Last A1c:      Latest Ref  Rng & Units 08/06/2023   10:40 AM 01/24/2023    7:49 AM  Hemoglobin A1C  Hemoglobin-A1c 4.0 - 5.6 % 7.4  7.5      Assessment:   1. Pain due to onychomycosis of toenail   2. Porokeratosis   3. Diabetic polyneuropathy associated with type 2 diabetes mellitus (HCC)     Plan:  Patient was evaluated and treated. All patient's and/or POA's questions/concerns addressed on today's visit. Toenails 1-5 debrided in length and girth without incident. Porokeratotic lesion(s) submet head 5 left foot and submet head 5 right foot pared with sharp debridement without incident. Continue daily foot inspections and monitor blood glucose per PCP/Endocrinologist's recommendations. Continue soft, supportive shoe gear daily. Report any pedal injuries to medical professional. Call office if there are any questions/concerns. -Patient/POA to call should there be question/concern in the interim.  Return in about 3 months (around 01/04/2024).  Freddie Breech, DPM      [place] LOCATION: [address] [place] [address]                   Office [phone]   [facility] LOCATION: [address] [place], [place] [address] Office [phone]

## 2023-11-08 DIAGNOSIS — H401122 Primary open-angle glaucoma, left eye, moderate stage: Secondary | ICD-10-CM | POA: Diagnosis not present

## 2023-11-08 DIAGNOSIS — H18519 Endothelial corneal dystrophy, unspecified eye: Secondary | ICD-10-CM | POA: Diagnosis not present

## 2023-11-08 DIAGNOSIS — H353131 Nonexudative age-related macular degeneration, bilateral, early dry stage: Secondary | ICD-10-CM | POA: Diagnosis not present

## 2023-11-08 DIAGNOSIS — H2512 Age-related nuclear cataract, left eye: Secondary | ICD-10-CM | POA: Diagnosis not present

## 2023-11-12 NOTE — Progress Notes (Unsigned)
 Cardiology Office Note:    Date:  11/13/2023  ID:  James Braun, DOB [birth year], MRN [id number] PCP: Helaine Llanos, MD  [facility] Providers Cardiologist:  Dorothye Gathers, MD Advanced Heart Failure:  Jules Oar, MD       Patient Profile:      Coronary artery disease S/p CABG in May 2021 (LIMA-LAD, SVG-DX, SVG-PL) Postoperative atrial fibrillation HFmrEF (heart failure with mildly reduced ejection fraction)  Ischemic cardiomyopathy Echo May 2021: EF 35 TTE 05/19/2020:EF 45-50, GR 1 DD, normal RVSF, mild MR inferior HK  Entresto  DC'd in March 2023 secondary to dizziness Hypertension Hyperlipidemia Diabetes mellitus Carotid stenosis US  06/29/2020: Bilateral ICA 1-39 Chronic dizziness Prior neuro eval Hx melanoma BPH        Discussed the use of AI scribe software for clinical note transcription with the patient, who gave verbal consent to proceed.  History of Present Illness James Braun is a 83 y.o. male who returns for follow up of CAD, CHF. He was last seen in 04/2023. He remained on ARNI, SGLT2i. He had stopped his Coreg . I placed him on Metoprolol  succinate.  He is here alone. He experienced dizziness after starting metoprolol , leading to its discontinuation. He had similar issues with carvedilol  in the past and is currently not on any beta blockers due to these side effects. He feels as good as he has since his heart operation, although not as well as before the surgery. He monitors his blood pressure at home, typically recording readings around 130 mmHg systolic. However, he notes higher readings in clinical settings, such as 150/80 mmHg during this visit, and 140/90 mmHg at another point.  He has not had chest discomfort, shortness of breath, syncope, or leg swelling. He maintains a routine of walking 20 minutes daily and feels good with this level of activity. He has a history of intolerance to statins, having tried multiple types which initially worked  but later caused significant discomfort, leading to discontinuation.    ROS-See HPI    Studies Reviewed:         Risk Assessment/Calculations:     HYPERTENSION CONTROL Vitals:   11/13/23 1133 11/13/23 1225  BP: (!) 150/80 (!) 140/90    The patient's blood pressure is elevated above target today.  In order to address the patient's elevated BP: Blood pressure will be monitored at home to determine if medication changes need to be made.          Physical Exam:   VS:  BP (!) 140/90   Pulse 72   Ht 5\' 8"  (1.727 m)   Wt 150 lb (68 kg)   SpO2 96%   BMI 22.81 kg/m    Wt Readings from Last 3 Encounters:  11/13/23 150 lb (68 kg)  08/06/23 150 lb (68 kg)  07/26/23 147 lb 12.8 oz (67 kg)    Constitutional:      Appearance: Healthy appearance. Not in distress.  Neck:     Vascular: JVD normal.  Pulmonary:     Breath sounds: Normal breath sounds. No wheezing. No rales.  Cardiovascular:     Normal rate. Regular rhythm.     Murmurs: There is no murmur.  Edema:    Peripheral edema absent.  Abdominal:     Palpations: Abdomen is soft.        Assessment and Plan:   Assessment & Plan HFmrEF (heart failure with mildly reduced ejection fraction) (HCC) Ejection fraction improved from 35% to 45-50% by echocardiogram  in November 2021. Intolerant of beta blockers, specifically carvedilol  and metoprolol , due to dizziness. Currently on Entresto  and Jardiance .  - Continue Entresto  24/26 mg twice daily - Continue Jardiance  10 mg daily - Consider increasing Entresto  to 49/51 mg twice daily if home blood pressure readings are above target - Follow up 1 year.  CAD in native artery Coronary artery disease with CABG in May 2021. He is not having chest pain to suggest angina. Intolerant to statins and declines further lipid management. - Continue aspirin  80 mg daily - Continue ASA 81 mg once daily  Essential hypertension, benign Hypertension with office blood pressure readings above target  but reportedly lower at home. Question if he has white coat hypertension.   - Monitor blood pressure at home for the next couple of weeks and send readings for review - Consider increasing Entresto  to 49/51 mg twice daily if home blood pressure readings are above target        Dispo:  Return in about 1 year (around 11/12/2024) for Routine Follow Up, w/ Dr. Renna Cary, or Marlyse Single, PA-C.  Signed, Marlyse Single, PA-C

## 2023-11-13 ENCOUNTER — Ambulatory Visit: Payer: PPO | Attending: Physician Assistant | Admitting: Physician Assistant

## 2023-11-13 ENCOUNTER — Telehealth: Payer: Self-pay | Admitting: Physician Assistant

## 2023-11-13 ENCOUNTER — Encounter: Payer: Self-pay | Admitting: Physician Assistant

## 2023-11-13 VITALS — BP 140/90 | HR 72 | Ht 68.0 in | Wt 150.0 lb

## 2023-11-13 DIAGNOSIS — I5020 Unspecified systolic (congestive) heart failure: Secondary | ICD-10-CM | POA: Diagnosis not present

## 2023-11-13 DIAGNOSIS — I10 Essential (primary) hypertension: Secondary | ICD-10-CM | POA: Diagnosis not present

## 2023-11-13 DIAGNOSIS — I2510 Atherosclerotic heart disease of native coronary artery without angina pectoris: Secondary | ICD-10-CM

## 2023-11-13 NOTE — Telephone Encounter (Signed)
 Pt was seen in office today and states that BP on his after visit summary is incorrect. 250/80 is incorrect. The other 2 bp readings are correct. Pt would like corrected in his chart

## 2023-11-13 NOTE — Assessment & Plan Note (Signed)
 Hypertension with office blood pressure readings above target but reportedly lower at home. Question if he has white coat hypertension.   - Monitor blood pressure at home for the next couple of weeks and send readings for review - Consider increasing Entresto  to 49/51 mg twice daily if home blood pressure readings are above target

## 2023-11-13 NOTE — Assessment & Plan Note (Signed)
 Ejection fraction improved from 35% to 45-50% by echocardiogram in November 2021. Intolerant of beta blockers, specifically carvedilol  and metoprolol , due to dizziness. Currently on Entresto  and Jardiance .  - Continue Entresto  24/26 mg twice daily - Continue Jardiance  10 mg daily - Consider increasing Entresto  to 49/51 mg twice daily if home blood pressure readings are above target - Follow up 1 year.

## 2023-11-13 NOTE — Assessment & Plan Note (Signed)
 Coronary artery disease with CABG in May 2021. He is not having chest pain to suggest angina. Intolerant to statins and declines further lipid management. - Continue aspirin  80 mg daily - Continue ASA 81 mg once daily

## 2023-11-13 NOTE — Patient Instructions (Signed)
 Medication Instructions:  Your physician recommends that you continue on your current medications as directed. Please refer to the Current Medication list given to you today.   *If you need a refill on your cardiac medications before your next appointment, please call your pharmacy*  Lab Work: None ordered  If you have labs (blood work) drawn today and your tests are completely normal, you will receive your results only by: MyChart Message (if you have MyChart) OR A paper copy in the mail If you have any lab test that is abnormal or we need to change your treatment, we will call you to review the results.  Testing/Procedures: None ordered  Follow-Up: At [facility], you and your health needs are our priority.  As part of our continuing mission to provide you with exceptional heart care, our providers are all part of one team.  This team includes your primary Cardiologist (physician) and Advanced Practice Providers or APPs (Physician Assistants and Nurse Practitioners) who all work together to provide you with the care you need, when you need it.  Your next appointment:   1 year(s)  Provider:   Dorothye Gathers, MD or Marlyse Single, PA-C          We recommend signing up for the patient portal called "MyChart".  Sign up information is provided on this After Visit Summary.  MyChart is used to connect with patients for Virtual Visits (Telemedicine).  Patients are able to view lab/test results, encounter notes, upcoming appointments, etc.  Non-urgent messages can be sent to your provider as well.   To learn more about what you can do with MyChart, go to [email or web address].   Other Instructions Your physician has requested that you regularly monitor and record your blood pressure readings at home. Please use the same machine at the same time of day to check your readings and record them to bring to your follow-up visit.   Please monitor blood pressures and keep a log of your  readings for 2 weeks then send them via mychart.    Make sure to check 2 hours after your medications.    AVOID these things for 30 minutes before checking your blood pressure: No Drinking caffeine. No Drinking alcohol. No Eating. No Smoking. No Exercising.   Five minutes before checking your blood pressure: Pee. Sit in a dining chair. Avoid sitting in a soft couch or armchair. Be quiet. Do not talk

## 2023-11-13 NOTE — Telephone Encounter (Signed)
 Returned call to pt and he has been made aware that his bp has already been corrected in his chart.

## 2023-11-19 ENCOUNTER — Other Ambulatory Visit: Payer: Self-pay | Admitting: Internal Medicine

## 2023-11-19 MED ORDER — FINASTERIDE 5 MG PO TABS: 5.0000 mg | ORAL_TABLET | Freq: Every day | ORAL | 3 refills | Status: AC

## 2023-11-19 NOTE — Telephone Encounter (Signed)
 Copied from CRM [id number]. Topic: Clinical - Medication Refill >> Nov 19, 2023  9:25 AM Artemio Larry wrote: Most Recent Primary Care Visit:  Provider: Curt Dover I  Department: [facility]  Visit Type: OFFICE VISIT  Date: 08/06/2023  Medication: finasteride    Has the patient contacted their pharmacy? Yes (Agent: If no, request that the patient contact the pharmacy for the refill. If patient does not wish to contact the pharmacy document the reason why and proceed with request.) (Agent: If yes, when and what did the pharmacy advise?)  Is this the correct pharmacy for this prescription? Yes If no, delete pharmacy and type the correct one.  This is the patient's preferred pharmacy:   [facility] [employer], [place] - [address] [place] [address] [place] [address] Phone: [phone] Fax: [phone]   Has the prescription been filled recently? Yes  Is the patient out of the medication? Yes  Has the patient been seen for an appointment in the last year OR does the patient have an upcoming appointment? Yes  Can we respond through MyChart? Yes  Agent: Please be advised that Rx refills may take up to 3 business days. We ask that you follow-up with your pharmacy.

## 2023-12-14 ENCOUNTER — Inpatient Hospital Stay (HOSPITAL_COMMUNITY)
Admission: EM | Admit: 2023-12-14 | Discharge: 2023-12-16 | DRG: 064 | Disposition: A | Discharge status: Home / Self Care | Attending: Internal Medicine | Admitting: Internal Medicine

## 2023-12-14 ENCOUNTER — Ambulatory Visit: Payer: Self-pay

## 2023-12-14 ENCOUNTER — Emergency Department (HOSPITAL_COMMUNITY)

## 2023-12-14 ENCOUNTER — Encounter (HOSPITAL_COMMUNITY): Payer: Self-pay | Admitting: Internal Medicine

## 2023-12-14 ENCOUNTER — Other Ambulatory Visit: Payer: Self-pay

## 2023-12-14 DIAGNOSIS — Z85820 Personal history of malignant melanoma of skin: Secondary | ICD-10-CM

## 2023-12-14 DIAGNOSIS — E119 Type 2 diabetes mellitus without complications: Secondary | ICD-10-CM | POA: Diagnosis not present

## 2023-12-14 DIAGNOSIS — E785 Hyperlipidemia, unspecified: Secondary | ICD-10-CM | POA: Diagnosis present

## 2023-12-14 DIAGNOSIS — R296 Repeated falls: Secondary | ICD-10-CM | POA: Diagnosis present

## 2023-12-14 DIAGNOSIS — N400 Enlarged prostate without lower urinary tract symptoms: Secondary | ICD-10-CM | POA: Diagnosis present

## 2023-12-14 DIAGNOSIS — R222 Localized swelling, mass and lump, trunk: Secondary | ICD-10-CM | POA: Diagnosis present

## 2023-12-14 DIAGNOSIS — I10 Essential (primary) hypertension: Secondary | ICD-10-CM

## 2023-12-14 DIAGNOSIS — R29898 Other symptoms and signs involving the musculoskeletal system: Secondary | ICD-10-CM | POA: Diagnosis not present

## 2023-12-14 DIAGNOSIS — R29700 NIHSS score 0: Secondary | ICD-10-CM | POA: Diagnosis not present

## 2023-12-14 DIAGNOSIS — J9859 Other diseases of mediastinum, not elsewhere classified: Secondary | ICD-10-CM | POA: Diagnosis not present

## 2023-12-14 DIAGNOSIS — Z83719 Family history of colon polyps, unspecified: Secondary | ICD-10-CM

## 2023-12-14 DIAGNOSIS — I110 Hypertensive heart disease with heart failure: Secondary | ICD-10-CM | POA: Diagnosis present

## 2023-12-14 DIAGNOSIS — I480 Paroxysmal atrial fibrillation: Secondary | ICD-10-CM | POA: Diagnosis not present

## 2023-12-14 DIAGNOSIS — I2510 Atherosclerotic heart disease of native coronary artery without angina pectoris: Secondary | ICD-10-CM | POA: Diagnosis present

## 2023-12-14 DIAGNOSIS — Z9049 Acquired absence of other specified parts of digestive tract: Secondary | ICD-10-CM

## 2023-12-14 DIAGNOSIS — I700 Atherosclerosis of aorta: Secondary | ICD-10-CM | POA: Diagnosis not present

## 2023-12-14 DIAGNOSIS — Z7984 Long term (current) use of oral hypoglycemic drugs: Secondary | ICD-10-CM | POA: Diagnosis not present

## 2023-12-14 DIAGNOSIS — R531 Weakness: Secondary | ICD-10-CM | POA: Diagnosis not present

## 2023-12-14 DIAGNOSIS — I255 Ischemic cardiomyopathy: Secondary | ICD-10-CM | POA: Diagnosis present

## 2023-12-14 DIAGNOSIS — K449 Diaphragmatic hernia without obstruction or gangrene: Secondary | ICD-10-CM | POA: Diagnosis not present

## 2023-12-14 DIAGNOSIS — R42 Dizziness and giddiness: Secondary | ICD-10-CM | POA: Diagnosis not present

## 2023-12-14 DIAGNOSIS — Z947 Corneal transplant status: Secondary | ICD-10-CM | POA: Diagnosis not present

## 2023-12-14 DIAGNOSIS — I5020 Unspecified systolic (congestive) heart failure: Secondary | ICD-10-CM | POA: Diagnosis not present

## 2023-12-14 DIAGNOSIS — I672 Cerebral atherosclerosis: Secondary | ICD-10-CM | POA: Diagnosis not present

## 2023-12-14 DIAGNOSIS — I6389 Other cerebral infarction: Secondary | ICD-10-CM | POA: Diagnosis not present

## 2023-12-14 DIAGNOSIS — Z888 Allergy status to other drugs, medicaments and biological substances status: Secondary | ICD-10-CM

## 2023-12-14 DIAGNOSIS — I639 Cerebral infarction, unspecified: Principal | ICD-10-CM | POA: Diagnosis present

## 2023-12-14 DIAGNOSIS — Z8249 Family history of ischemic heart disease and other diseases of the circulatory system: Secondary | ICD-10-CM

## 2023-12-14 DIAGNOSIS — R1310 Dysphagia, unspecified: Secondary | ICD-10-CM | POA: Diagnosis not present

## 2023-12-14 DIAGNOSIS — I509 Heart failure, unspecified: Secondary | ICD-10-CM | POA: Diagnosis not present

## 2023-12-14 DIAGNOSIS — K219 Gastro-esophageal reflux disease without esophagitis: Secondary | ICD-10-CM | POA: Diagnosis not present

## 2023-12-14 DIAGNOSIS — Z951 Presence of aortocoronary bypass graft: Secondary | ICD-10-CM

## 2023-12-14 DIAGNOSIS — I5022 Chronic systolic (congestive) heart failure: Secondary | ICD-10-CM | POA: Diagnosis not present

## 2023-12-14 DIAGNOSIS — I63422 Cerebral infarction due to embolism of left anterior cerebral artery: Principal | ICD-10-CM | POA: Diagnosis present

## 2023-12-14 DIAGNOSIS — Z881 Allergy status to other antibiotic agents status: Secondary | ICD-10-CM

## 2023-12-14 DIAGNOSIS — E1165 Type 2 diabetes mellitus with hyperglycemia: Secondary | ICD-10-CM | POA: Diagnosis not present

## 2023-12-14 DIAGNOSIS — I69391 Dysphagia following cerebral infarction: Secondary | ICD-10-CM | POA: Diagnosis not present

## 2023-12-14 DIAGNOSIS — E118 Type 2 diabetes mellitus with unspecified complications: Secondary | ICD-10-CM

## 2023-12-14 DIAGNOSIS — Z7982 Long term (current) use of aspirin: Secondary | ICD-10-CM

## 2023-12-14 DIAGNOSIS — I63522 Cerebral infarction due to unspecified occlusion or stenosis of left anterior cerebral artery: Secondary | ICD-10-CM | POA: Diagnosis not present

## 2023-12-14 DIAGNOSIS — Z8679 Personal history of other diseases of the circulatory system: Secondary | ICD-10-CM | POA: Diagnosis not present

## 2023-12-14 DIAGNOSIS — I6521 Occlusion and stenosis of right carotid artery: Secondary | ICD-10-CM | POA: Diagnosis not present

## 2023-12-14 DIAGNOSIS — Z882 Allergy status to sulfonamides status: Secondary | ICD-10-CM

## 2023-12-14 DIAGNOSIS — Z833 Family history of diabetes mellitus: Secondary | ICD-10-CM

## 2023-12-14 DIAGNOSIS — Z87442 Personal history of urinary calculi: Secondary | ICD-10-CM | POA: Diagnosis not present

## 2023-12-14 DIAGNOSIS — G8191 Hemiplegia, unspecified affecting right dominant side: Secondary | ICD-10-CM | POA: Diagnosis not present

## 2023-12-14 DIAGNOSIS — I6782 Cerebral ischemia: Secondary | ICD-10-CM | POA: Diagnosis not present

## 2023-12-14 LAB — COMPREHENSIVE METABOLIC PANEL WITH GFR
ALT: 14 U/L (ref 0–44)
AST: 14 U/L — ABNORMAL LOW (ref 15–41)
Albumin: 3.7 g/dL (ref 3.5–5.0)
Alkaline Phosphatase: 45 U/L (ref 38–126)
Anion gap: 11 (ref 5–15)
BUN: 23 mg/dL (ref 8–23)
CO2: 27 mmol/L (ref 22–32)
Calcium: 9.4 mg/dL (ref 8.9–10.3)
Chloride: 101 mmol/L (ref 98–111)
Creatinine, Ser: 1.25 mg/dL — ABNORMAL HIGH (ref 0.61–1.24)
GFR, Estimated: 57 mL/min — ABNORMAL LOW (ref >60)
Glucose, Bld: 228 mg/dL — ABNORMAL HIGH (ref 70–99)
Potassium: 3.9 mmol/L (ref 3.5–5.1)
Sodium: 139 mmol/L (ref 135–145)
Total Bilirubin: 0.7 mg/dL (ref 0.0–1.2)
Total Protein: 6.8 g/dL (ref 6.5–8.1)

## 2023-12-14 LAB — CBC
HCT: 48.1 % (ref 39.0–52.0)
Hemoglobin: 15.3 g/dL (ref 13.0–17.0)
MCH: 28.4 pg (ref 26.0–34.0)
MCHC: 31.8 g/dL (ref 30.0–36.0)
MCV: 89.4 fL (ref 80.0–100.0)
Platelets: 344 10*3/uL (ref 150–400)
RBC: 5.38 MIL/uL (ref 4.22–5.81)
RDW: 15.6 % — ABNORMAL HIGH (ref 11.5–15.5)
WBC: 6.2 10*3/uL (ref 4.0–10.5)
nRBC: 0 % (ref 0.0–0.2)

## 2023-12-14 LAB — CBG MONITORING, ED
Glucose-Capillary: 267 mg/dL — ABNORMAL HIGH (ref 70–99)
Glucose-Capillary: 139 mg/dL — ABNORMAL HIGH (ref 70–99)

## 2023-12-14 MED ORDER — ADULT MULTIVITAMIN W/MINERALS CH
1.0000 | ORAL_TABLET | Freq: Every day | ORAL | Status: DC
  Administered 2023-12-15 – 2023-12-16 (×2): 1 via ORAL
  Filled 2023-12-14 (×2): qty 1

## 2023-12-14 MED ORDER — INSULIN ASPART 100 UNIT/ML IJ SOLN
0.0000 [IU] | Freq: Three times a day (TID) | INTRAMUSCULAR | Status: DC
  Administered 2023-12-14: 1 [IU] via SUBCUTANEOUS
  Administered 2023-12-15: 3 [IU] via SUBCUTANEOUS
  Administered 2023-12-15: 1 [IU] via SUBCUTANEOUS
  Administered 2023-12-16: 2 [IU] via SUBCUTANEOUS

## 2023-12-14 MED ORDER — STROKE: EARLY STAGES OF RECOVERY BOOK
Freq: Once | Status: AC
  Filled 2023-12-14: qty 1

## 2023-12-14 MED ORDER — SODIUM CHLORIDE 0.9% FLUSH
3.0000 mL | Freq: Two times a day (BID) | INTRAVENOUS | Status: DC
  Administered 2023-12-14 – 2023-12-16 (×4): 3 mL via INTRAVENOUS

## 2023-12-14 MED ORDER — CLOPIDOGREL BISULFATE 300 MG PO TABS
300.0000 mg | ORAL_TABLET | Freq: Once | ORAL | Status: AC
  Administered 2023-12-14: 300 mg via ORAL
  Filled 2023-12-14: qty 1

## 2023-12-14 MED ORDER — ALUM HYDROXIDE-MAG TRISILICATE 80-20 MG PO CHEW
2.0000 | CHEWABLE_TABLET | Freq: Three times a day (TID) | ORAL | Status: DC | PRN
  Filled 2023-12-14: qty 2

## 2023-12-14 MED ORDER — PANTOPRAZOLE SODIUM 40 MG PO TBEC
40.0000 mg | DELAYED_RELEASE_TABLET | Freq: Every day | ORAL | Status: DC
  Administered 2023-12-15 – 2023-12-16 (×2): 40 mg via ORAL
  Filled 2023-12-14 (×2): qty 1

## 2023-12-14 MED ORDER — IOHEXOL 350 MG/ML SOLN
75.0000 mL | Freq: Once | INTRAVENOUS | Status: AC | PRN
  Administered 2023-12-14: 75 mL via INTRAVENOUS

## 2023-12-14 MED ORDER — CLOPIDOGREL BISULFATE 75 MG PO TABS
75.0000 mg | ORAL_TABLET | Freq: Every day | ORAL | Status: DC
  Administered 2023-12-15 – 2023-12-16 (×2): 75 mg via ORAL
  Filled 2023-12-14 (×2): qty 1

## 2023-12-14 MED ORDER — FINASTERIDE 5 MG PO TABS
5.0000 mg | ORAL_TABLET | Freq: Every day | ORAL | Status: DC
  Administered 2023-12-15 – 2023-12-16 (×2): 5 mg via ORAL
  Filled 2023-12-14 (×2): qty 1

## 2023-12-14 MED ORDER — ALBUTEROL SULFATE (2.5 MG/3ML) 0.083% IN NEBU: 2.5000 mg | INHALATION_SOLUTION | RESPIRATORY_TRACT | Status: DC | PRN

## 2023-12-14 MED ORDER — VITAMIN D 25 MCG (1000 UNIT) PO TABS
1000.0000 [IU] | ORAL_TABLET | Freq: Two times a day (BID) | ORAL | Status: DC
  Administered 2023-12-14 – 2023-12-16 (×4): 1000 [IU] via ORAL
  Filled 2023-12-14 (×4): qty 1

## 2023-12-14 MED ORDER — ASPIRIN 81 MG PO TBEC
81.0000 mg | DELAYED_RELEASE_TABLET | Freq: Every day | ORAL | Status: DC
  Administered 2023-12-14 – 2023-12-16 (×3): 81 mg via ORAL
  Filled 2023-12-14 (×3): qty 1

## 2023-12-14 MED ORDER — POLYETHYLENE GLYCOL 3350 17 G PO PACK
17.0000 g | PACK | Freq: Every day | ORAL | Status: DC | PRN
Start: 2023-12-14 — End: 2023-12-16

## 2023-12-14 MED ORDER — ENOXAPARIN SODIUM 40 MG/0.4ML IJ SOSY
40.0000 mg | PREFILLED_SYRINGE | INTRAMUSCULAR | Status: DC
  Administered 2023-12-14 – 2023-12-15 (×2): 40 mg via SUBCUTANEOUS
  Filled 2023-12-14 (×2): qty 0.4

## 2023-12-14 MED ORDER — SODIUM CHLORIDE 0.9% FLUSH: 3.0000 mL | INTRAVENOUS | Status: DC | PRN

## 2023-12-14 MED ORDER — ACETAMINOPHEN 325 MG PO TABS: 650.0000 mg | ORAL_TABLET | Freq: Four times a day (QID) | ORAL | Status: DC | PRN

## 2023-12-14 MED ORDER — ONDANSETRON HCL 4 MG PO TABS: 4.0000 mg | ORAL_TABLET | Freq: Four times a day (QID) | ORAL | Status: DC | PRN

## 2023-12-14 MED ORDER — SODIUM CHLORIDE 0.9 % IV SOLN
250.0000 mL | INTRAVENOUS | Status: AC | PRN
Start: 2023-12-14 — End: 2023-12-15

## 2023-12-14 MED ORDER — ACETAMINOPHEN 650 MG RE SUPP
650.0000 mg | Freq: Four times a day (QID) | RECTAL | Status: DC | PRN
Start: 2023-12-14 — End: 2023-12-16

## 2023-12-14 MED ORDER — VITAMIN B-12 100 MCG PO TABS
1000.0000 ug | ORAL_TABLET | Freq: Every day | ORAL | Status: DC
  Administered 2023-12-15 – 2023-12-16 (×2): 1000 ug via ORAL
  Filled 2023-12-14 (×2): qty 10

## 2023-12-14 MED ORDER — ONDANSETRON HCL 4 MG/2ML IJ SOLN: 4.0000 mg | Freq: Four times a day (QID) | INTRAMUSCULAR | Status: DC | PRN

## 2023-12-14 NOTE — Telephone Encounter (Signed)
 Copied from CRM [id number]. Topic: Clinical - Red Word Triage >> Dec 14, 2023  8:17 AM Deaijah H wrote: Red Word that prompted transfer to Nurse Triage: Developing Dizziness and weakness  Chief Complaint: dizziness, weakness, right leg weakness greater than left, states cannot walk unless he is holding on to objects.   Symptoms: see above Frequency: for one week and progressively is becoming worse. Pertinent Negatives: Patient denies cp, sob, falls Disposition: [x] ED /[] Urgent Care (no appt availability in office) / [] Appointment(In office/virtual)/ []  [facility]/ [] Home Care/ [] Refused Recommended Disposition /[] [facility]/ []  Follow-up with PCP Additional Notes: patient upset that he was instructed to go to the ER.  States he just wanted an appointment to see if he is anemic.  This RN instructed patient that with the dizziness he is having, weakness in right leg, generalized weakness that he should be seen in the ER.  Pcp office updated.   Reason for Disposition  SEVERE dizziness (e.g., unable to stand, requires support to walk, feels like passing out now)  Answer Assessment - Initial Assessment Questions 1. DESCRIPTION: "Describe your dizziness."     For about a week states has been getting weaker and dizziness is increasing 2. LIGHTHEADED: "Do you feel lightheaded?" (e.g., somewhat faint, woozy, weak upon standing)     Weak upon standing and walking 3. VERTIGO: "Do you feel like either you or the room is spinning or tilting?" (i.e. vertigo)     Neither, just dizzy 4. SEVERITY: "How bad is it?"  "Do you feel like you are going to faint?" "Can you stand and walk?"   - MILD: Feels slightly dizzy, but walking normally.   - MODERATE: Feels unsteady when walking, but not falling; interferes with normal activities (e.g., school, work).   - SEVERE: Unable to walk without falling, or requires assistance to walk without falling; feels like passing out now.      severe 5.  ONSET:  "When did the dizziness begin?"     About a week ago 6. AGGRAVATING FACTORS: "Does anything make it worse?" (e.g., standing, change in head position)     Standing and walking 7. HEART RATE: "Can you tell me your heart rate?" "How many beats in 15 seconds?"  (Note: not all patients can do this)       denies 8. CAUSE: "What do you think is causing the dizziness?"     unknown 9. RECURRENT SYMPTOM: "Have you had dizziness before?" If Yes, ask: "When was the last time?" "What happened that time?"     denies 10. OTHER SYMPTOMS: "Do you have any other symptoms?" (e.g., fever, chest pain, vomiting, diarrhea, bleeding)       denies 11. PREGNANCY: "Is there any chance you are pregnant?" "When was your last menstrual period?"       na  Protocols used: Dizziness - Lightheadedness-A-AH

## 2023-12-14 NOTE — ED Provider Notes (Signed)
 Pt signed out awaiting consult for admission.  Pt d/w Dr. Hilton Lucky ([facility]) for admission.   Sueellen Emery, MD 12/14/23 1539

## 2023-12-14 NOTE — ED Notes (Signed)
 PT refusing to leave BP cuff and pulse ox on. She has continued to leave heart monitor on after pleading.

## 2023-12-14 NOTE — Consult Note (Signed)
 NEUROLOGY CONSULT NOTE   Date of service: Dec 14, 2023 Patient Name: James Braun MRN:  [id number] DOB:  Dec 02, 1940 Chief Complaint: "weakness" Requesting Provider: Burton Casey, MD  History of Present Illness  James Braun is a 83 y.o. male with hx of CAD s/p CABG, HTN, DM who presented to ED 5/30 d/t RLE weakness for 8 days. He also endorsed associated dizziness and feeling off-balance. MRI brain showed posterior left frontal lobe and left parietal lobe infarcts. CTA showed near-occlusive stenosis left ACA A2. On exam, patient is sitting in chair at bedside, oriented, no aphasia or dysarthria, RLE with no drift but does have slight ataxia during HKS. He was on Aspirin  PTA, stated that he was on Eliquis  after his CABG procedure 4 years ago and remembers that he "had some sort of reaction" but he can't remember exactly what it was.  Reviewed stroke workup with patient and wife at bedside.     ROS  Comprehensive ROS performed and pertinent positives documented in HPI   Past History   Past Medical History:  Diagnosis Date   BPH (benign prostatic hypertrophy)    Cancer (HCC)    melanoma   Coronary artery disease    History of kidney stones    History of melanoma excision OCT 2010   Hyperlipidemia    Hypertension    Left ureteral calculus    Nephrolithiasis    bilateral   Type 2 diabetes mellitus (HCC)     Past Surgical History:  Procedure Laterality Date   CARDIAC CATHETERIZATION     CHOLECYSTECTOMY  01/1999   CORNEAL TRANSPLANT Right 2023   CORONARY ARTERY BYPASS GRAFT N/A 11/28/2019   Procedure: CORONARY ARTERY BYPASS GRAFTING (CABG) x3, using left internal mammary artery and Saphenous vein harvested endoscopically;  Surgeon: Heriberto London, MD;  Location: [facility] OR;  Service: Open Heart Surgery;  Laterality: N/A;   CYSTOSCOPY WITH STENT PLACEMENT Left 11/14/2012   Procedure: CYSTOSCOPY WITH STENT PLACEMENT;  Surgeon: Trent Frizzle, MD;  Location: [facility];  Service: Urology;  Laterality: Left;   CYSTOSCOPY/RETROGRADE/URETEROSCOPY/STONE EXTRACTION WITH BASKET Left 11/14/2012   Procedure: CYSTOSCOPY/RETROGRADE/URETEROSCOPY/STONE EXTRACTION WITH BASKET;  Surgeon: Trent Frizzle, MD;  Location: [facility];  Service: Urology;  Laterality: Left;   EXTRACORPOREAL SHOCK WAVE LITHOTRIPSY  01/1999   X2   HOLMIUM LASER APPLICATION Left 11/14/2012   Procedure: HOLMIUM LASER APPLICATION;  Surgeon: Trent Frizzle, MD;  Location: [facility];  Service: Urology;  Laterality: Left;   LEFT HEART CATH AND CORONARY ANGIOGRAPHY N/A 11/27/2019   Procedure: LEFT HEART CATH AND CORONARY ANGIOGRAPHY;  Surgeon: Avanell Leigh, MD;  Location: [facility] IN[facility];  Service: Cardiovascular;  Laterality: N/A;   LEFT URETEROSCOPIC STONE EXTRACTION  02/22/2007   NEPHROLITHOTOMY Right 09/28/2010   percutaneous   ORIF CLAVICLE FRACTURE  2006   PROSTATE BIOPSY  1998   neg   SHOULDER ARTHROSCOPY  07/2013   Dr Genevive Ket   TEE WITHOUT CARDIOVERSION N/A 11/28/2019   Procedure: TRANSESOPHAGEAL ECHOCARDIOGRAM (TEE);  Surgeon: Matt Song, Donata Fryer, MD;  Location: [facility] OR;  Service: Open Heart Surgery;  Laterality: N/A;   TONSILLECTOMY      Family History: Family History  Problem Relation Age of Onset   Hypertension Mother    Diabetes Father    Hypertension Father    Colon polyps Brother    Coronary artery disease Neg Hx    Cancer Neg Hx    Esophageal cancer Neg Hx  Stomach cancer Neg Hx    Rectal cancer Neg Hx     Social History  reports that he has never smoked. He has never been exposed to tobacco smoke. He has never used smokeless tobacco. He reports that he does not drink alcohol and does not use drugs.  Allergies  Allergen Reactions   Lipitor [Atorvastatin] Other (See Comments)    myalgias   Lisinopril    Morphine And Codeine Other (See Comments)     hypotension   Levofloxacin Rash   Septra  [Sulfamethoxazole-Trimethoprim] Rash   Sulfamethoxazole Rash   Sulfamethoxazole-Trimethoprim Rash    Medications   Current Facility-Administered Medications:    [START ON 12/15/2023]  stroke: early stages of recovery book, , Does not apply, Once, Ghimire, Shanker M, MD   0.9 %  sodium chloride  infusion, 250 mL, Intravenous, PRN, Ghimire, Estil Heman, MD   acetaminophen  (TYLENOL ) tablet 650 mg, 650 mg, Oral, Q6H PRN **OR** acetaminophen  (TYLENOL ) suppository 650 mg, 650 mg, Rectal, Q6H PRN, Ghimire, Estil Heman, MD   albuterol (PROVENTIL) (2.5 MG/3ML) 0.083% nebulizer solution 2.5 mg, 2.5 mg, Nebulization, Q2H PRN, Ghimire, Estil Heman, MD   alum hydroxide-mag trisilicate (GAVISCON) 80-20 MG per chewable tablet 2 tablet, 2 tablet, Oral, TID PRN, Ghimire, Estil Heman, MD   aspirin  EC tablet 81 mg, 81 mg, Oral, Daily, Ghimire, Estil Heman, MD   cholecalciferol (VITAMIN D3) 25 MCG (1000 UNIT) tablet 1,000 Units, 1,000 Units, Oral, BID, Ghimire, Estil Heman, MD   Cecily Cohen ON 12/15/2023] clopidogrel (PLAVIX) tablet 75 mg, 75 mg, Oral, Daily, Nikko Goldwire, Althea Atkinson, MD   enoxaparin  (LOVENOX ) injection 40 mg, 40 mg, Subcutaneous, Q24H, Ghimire, Estil Heman, MD   finasteride  (PROSCAR ) tablet 5 mg, 5 mg, Oral, Daily, Ghimire, Estil Heman, MD   insulin  aspart (novoLOG ) injection 0-9 Units, 0-9 Units, Subcutaneous, TID WC, Ghimire, Estil Heman, MD   multivitamin tablet 1 tablet, 1 tablet, Oral, Daily, Ghimire, Estil Heman, MD   ondansetron  (ZOFRAN ) tablet 4 mg, 4 mg, Oral, Q6H PRN **OR** ondansetron  (ZOFRAN ) injection 4 mg, 4 mg, Intravenous, Q6H PRN, Ghimire, Estil Heman, MD   pantoprazole  (PROTONIX ) EC tablet 40 mg, 40 mg, Oral, Daily, Ghimire, Estil Heman, MD   polyethylene glycol (MIRALAX / GLYCOLAX) packet 17 g, 17 g, Oral, Daily PRN, Ghimire, Estil Heman, MD   sodium chloride  flush (NS) 0.9 % injection 3 mL, 3 mL, Intravenous, Q12H, Ghimire, Estil Heman, MD   sodium chloride  flush (NS) 0.9 % injection 3 mL, 3 mL, Intravenous,  PRN, Ghimire, Estil Heman, MD   Vitamin B-12 SUBL 1,000 mcg, 1,000 mcg, Sublingual, Daily, Ghimire, Estil Heman, MD  Current Outpatient Medications:    Acetaminophen  (TYLENOL  8 HOUR ARTHRITIS PAIN PO), Take by mouth., Disp: , Rfl:    alum hydroxide-mag trisilicate (GAVISCON) 80-20 MG CHEW chewable tablet, Chew 2 tablets by mouth 3 (three) times daily as needed for indigestion or heartburn., Disp: , Rfl:    aspirin  EC 81 MG tablet, Take 1 tablet (81 mg total) by mouth daily. Swallow whole., Disp: 90 tablet, Rfl: 3   Cyanocobalamin  (VITAMIN B-12) 1000 MCG SUBL, Place 1,000 mcg under the tongue. 1000 mcg SL daily, Disp: , Rfl:    empagliflozin  (JARDIANCE ) 10 MG TABS tablet, Take 1 tablet (10 mg total) by mouth daily before breakfast., Disp: 90 tablet, Rfl: 3   ferrous sulfate 325 (65 FE) MG tablet, Take 325 mg by mouth daily with breakfast., Disp: , Rfl:    finasteride  (PROSCAR ) 5 MG tablet, Take 1 tablet (5  mg total) by mouth daily., Disp: 90 tablet, Rfl: 3   LUMIGAN 0.01 % SOLN, Place 1 drop into the left eye daily., Disp: , Rfl:    metFORMIN  (GLUCOPHAGE -XR) 500 MG 24 hr tablet, Take 1 tablet (500 mg total) by mouth 2 (two) times daily., Disp: 180 tablet, Rfl: 1   Multiple Vitamin (MULTIVITAMIN) tablet, Take 1 tablet by mouth daily., Disp: , Rfl:    Nutritional Supplements (NUTRITIONAL SUPPLEMENT PO), Take by mouth. Curamin, Disp: , Rfl:    omeprazole  (PRILOSEC) 20 MG capsule, TAKE 1 CAPSULE BY MOUTH EVERY DAY, Disp: 90 capsule, Rfl: 2   OVER THE COUNTER MEDICATION, HA Joint Formula: Vit C, Magnesium , Collagen, Quercetin, Olive Extract, Boswellia, Disp: , Rfl:    OVER THE COUNTER MEDICATION, Joint Supplement, Disp: , Rfl:    Potassium Citrate  15 MEQ (1620 MG) TBCR, Take 1 tablet by mouth 3 (three) times daily., Disp: 270 tablet, Rfl: 3   sacubitril -valsartan  (ENTRESTO ) 24-26 MG, Take 1 tablet by mouth 2 (two) times daily., Disp: 180 tablet, Rfl: 3   sildenafil  (REVATIO ) 20 MG tablet, TAKE 3-5 TABLETS  BY MOUTH EVERY DAY AS NEEDED, Disp: 50 tablet, Rfl: 5   SIMBRINZA 1-0.2 % SUSP, Place 1 drop into the left eye 2 (two) times daily., Disp: , Rfl:    timolol  (TIMOPTIC ) 0.5 % ophthalmic solution, Place 1 drop into both eyes 2 (two) times daily. , Disp: , Rfl:    Turmeric, Curcuma [place], (CURCUMIN) POWD, by Does not apply route., Disp: , Rfl:    Vitamin D3 (VITAMIN D ) 25 MCG tablet, Take 1 tablet by mouth 2 (two) times daily., Disp: , Rfl:   Vitals   Vitals:   23-Dec-2023 0932 December 23, 2023 0951 23-Dec-2023 1236 12-23-2023 1345  BP: (!) 149/94  (!) 135/91 (!) 155/94  Pulse: 78  82 82  Resp: 16  18 17   Temp: 98.1 F (36.7 C)   97.9 F (36.6 C)  TempSrc: Oral   Oral  SpO2: 94%  99% 100%  Weight:  63.5 kg    Height:  5\' 8"  (1.727 m)      Body mass index is 21.29 kg/m.  Physical Exam   Constitutional: Appears well-developed and well-nourished.  Cardiovascular: Normal rate and regular rhythm.  Respiratory: Effort normal, non-labored breathing.   Neurologic Examination   Neuro: Mental Status: Patient is awake, alert, oriented to person, place, month, year, and situation. Patient is able to give a clear and coherent history. No signs of dysarthria, aphasia or neglect Cranial Nerves: II: Visual Fields are full. "Fuzzy" vision in R eye at baseline.  Pupils are equal, round, and reactive to light.   III,IV, VI: EOMI without ptosis or diploplia.  V: Facial sensation is symmetric to light touch VII: Facial movement is symmetric.  VIII: hearing is intact to voice X: Uvula elevates symmetrically XI: Shoulder shrug is symmetric. XII: tongue is midline without atrophy or fasciculations.  Motor: Tone is normal. Bulk is normal. 4+/5 RLE, no drift.  Sensory: Sensation is symmetric to light touch in the arms and legs. Cerebellar: FNF intact bilaterally. HKS slow on RLE.  Reduced rapid movements on R foot.    Labs/Imaging/Neurodiagnostic studies   CBC:  Recent Labs  Lab 2023/12/23 1022  WBC  6.2  HGB 15.3  HCT 48.1  MCV 89.4  PLT 344   Basic Metabolic Panel:  Lab Results  Component Value Date   NA 139 12-23-2023   K 3.9 12-23-23   CO2 27 23-Dec-2023  GLUCOSE 228 (H) 12/14/2023   BUN 23 12/14/2023   CREATININE 1.25 (H) 12/14/2023   CALCIUM  9.4 12/14/2023   GFRNONAA 57 (L) 12/14/2023   GFRAA 49 (L) 01/06/2020   Lipid Panel:  Lab Results  Component Value Date   LDLCALC 127 (H) 01/24/2023   HgbA1c:  Lab Results  Component Value Date   HGBA1C 7.4 (A) 08/06/2023   Urine Drug Screen: No results found for: "LABOPIA", "COCAINSCRNUR", "LABBENZ", "AMPHETMU", "THCU", "LABBARB"  Alcohol Level No results found for: "ETH" INR  Lab Results  Component Value Date   INR 1.6 (H) 12/08/2019   APTT  Lab Results  Component Value Date   APTT 34 11/28/2019   AED levels: No results found for: "PHENYTOIN", "ZONISAMIDE", "LAMOTRIGINE", "LEVETIRACETA"  CT Head without contrast(Personally reviewed): No acute intracranial abnormality by CT.  Moderate for age cerebral white matter changes, stable since 2021.  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  A severe, near-occlusive stenosis is noted within the left anterior cerebral artery A2 segment on the CTA performed earlier today. Background parenchymal atrophy and chronic small vessel ischemic disease  ASSESSMENT   James Braun is a 83 y.o. male with hx of CAD s/p CABG, HTN, DM who presented to ED 5/30 d/t RLE  weakness for 8 days. He also endorsed associated dizziness and feeling off-balance. MRI brain showed posterior left frontal lobe and left parietal lobe infarcts. CTA showed near-occlusive stenosis left ACA A2. On exam, patient is sitting in chair at bedside, oriented, no aphasia or dysarthria, RLE with no drift but does have slight ataxia during HKS. He was on Aspirin  PTA, stated that he was on Eliquis  after his CABG procedure 4 years ago and remembers that he "had some sort of reaction" but he can't remember exactly what it was.   Impression: Acute Ischemic Infarct left frontal and left parietal lobe, embolic from atherosclerosis versus cardioembolic  RECOMMENDATIONS  - Frequent Neuro checks per stroke unit protocol - TTE w/bubble study  - Lipid panel - Statin - will be started if LDL>70 or otherwise medically indicated - A1C - Antithrombotic - aspirin  and Plavix for 21 days, then monotherapy with Plavix as patient was on Aspirin  PTA. Stroke team may make changes to recommendation based on vessel imaging and further workup.  - DVT ppx - ok for dvt ppx - SBP goal - <220, PRN labetalol  if HR>60 and PRN Hydralazine  if HR<60 - Telemetry monitoring for arrhythmia  - Swallow screen - will be performed prior to PO intake - Stroke education - will be given - PT/OT/SLP - Dispo: admit for stroke workup  ______________________________________________________________________   Signed, Audrene Lease, NP [facility]   I have seen the patient reviewed the above note.  He has severe stenosis of his left A2, and it is possible this is intrinsic stenosis or recanalized embolus.  He will need further evaluation for secondary stroke prevention.  He does have a history of a single episode of peri-operative atrial fibrillation in the past, unclear if this continues to be a problem meriting anticoagulation.  Stroke team to follow.   Ann Keto, MD [facility]   If 7pm- 7am,  please page neurology on call as listed in AMION.

## 2023-12-14 NOTE — ED Provider Notes (Signed)
 [place] [facility]ERGENCY DEPARTMENT AT [facility] Provider Note   CSN: [id number] Arrival date & time: 12/14/23  6962     History  Chief Complaint  Patient presents with   Weakness    James Braun is a 83 y.o. male.  Patient is an 83 year old male with a past medical history of CAD status post CABG, hypertension, diabetes presenting to the emergency department with right leg weakness.  Patient states around 8 days ago he woke up in the morning and his right leg was weak and was giving out on him when trying to walk.  He states that initially it felt like it was getting better but states over the last week the weakness has been coming and going.  He states he has also had associated dizziness which he describes as feeling off balance and like he needs to hold onto the wall to walk.  He denies any associated numbness or tingling.  He states that he had 1 episode of back pain that he attributed to sleeping abnormally that has since resolved.  The history is provided by the patient and the spouse.  Weakness      Home Medications Prior to Admission medications   Medication Sig Start Date End Date Taking? Authorizing Provider  Acetaminophen  (TYLENOL  8 HOUR ARTHRITIS PAIN PO) Take by mouth.    [provider]  alum hydroxide-mag trisilicate (GAVISCON ) 80-20 MG CHEW chewable tablet Chew 2 tablets by mouth 3 (three) times daily as needed for indigestion or heartburn.    [provider]  aspirin  EC 81 MG tablet Take 1 tablet (81 mg total) by mouth daily. Swallow whole. 12/19/21   Marlyse Single T, PA-C  Cyanocobalamin  (VITAMIN B-12) 1000 MCG SUBL Place 1,000 mcg under the tongue. 1000 mcg SL daily    [provider]  empagliflozin  (JARDIANCE ) 10 MG TABS tablet Take 1 tablet (10 mg total) by mouth daily before breakfast. 07/20/23   Helaine Llanos, MD  ferrous sulfate 325 (65 FE) MG tablet Take 325 mg by mouth daily with breakfast.    [provider]   finasteride  (PROSCAR ) 5 MG tablet Take 1 tablet (5 mg total) by mouth daily. 11/19/23   Helaine Llanos, MD  LUMIGAN 0.01 % SOLN Place 1 drop into the left eye daily. 09/23/21   [provider]  metFORMIN  (GLUCOPHAGE -XR) 500 MG 24 hr tablet Take 1 tablet (500 mg total) by mouth 2 (two) times daily. 07/25/23   Helaine Llanos, MD  Multiple Vitamin (MULTIVITAMIN) tablet Take 1 tablet by mouth daily.    [provider]  Nutritional Supplements (NUTRITIONAL SUPPLEMENT PO) Take by mouth. Curamin    [provider]  omeprazole  (PRILOSEC) 20 MG capsule TAKE 1 CAPSULE BY MOUTH EVERY DAY 05/11/23   Helaine Llanos, MD  OVER THE COUNTER MEDICATION HA Joint Formula: Vit C, Magnesium , Collagen, Quercetin, Olive Extract, Boswellia    [provider]  OVER THE COUNTER MEDICATION Joint Supplement    [provider]  Potassium Citrate  15 MEQ (1620 MG) TBCR Take 1 tablet by mouth 3 (three) times daily. 02/26/23   Helaine Llanos, MD  sacubitril -valsartan  (ENTRESTO ) 24-26 MG Take 1 tablet by mouth 2 (two) times daily. 05/17/23   Weaver, Scott T, PA-C  sildenafil  (REVATIO ) 20 MG tablet TAKE 3-5 TABLETS BY MOUTH EVERY DAY AS NEEDED 10/08/23   Helaine Llanos, MD  SIMBRINZA 1-0.2 % SUSP Place 1 drop into the left eye 2 (two) times  daily. 03/29/21   [provider]  timolol  (TIMOPTIC ) 0.5 % ophthalmic solution Place 1 drop into both eyes 2 (two) times daily.  09/24/18   [provider]  Turmeric, Fermin Hove, (CURCUMIN) POWD by Does not apply route.    [provider]  Vitamin D3 (VITAMIN D) 25 MCG tablet Take 1 tablet by mouth 2 (two) times daily.    [provider]      Allergies    Lipitor [atorvastatin], Lisinopril, Morphine and codeine, Levofloxacin, Septra [sulfamethoxazole-trimethoprim], Sulfamethoxazole, and Sulfamethoxazole-trimethoprim    Review of Systems   Review of Systems  Neurological:  Positive for weakness.     Physical Exam Updated Vital Signs BP (!) 155/94 (BP Location: Left Arm)   Pulse 82   Temp 97.9 F (36.6 C) (Oral)   Resp 17   Ht 5\' 8"  (1.727 m)   Wt 63.5 kg   SpO2 100%   BMI 21.29 kg/m  Physical Exam Vitals and nursing note reviewed.  Constitutional:      General: He is not in acute distress.    Appearance: Normal appearance.  HENT:     Head: Normocephalic.     Nose: Nose normal.     Mouth/Throat:     Mouth: Mucous membranes are moist.     Pharynx: Oropharynx is clear.  Eyes:     Extraocular Movements: Extraocular movements intact.     Conjunctiva/sclera: Conjunctivae normal.     Pupils: Pupils are equal, round, and reactive to light.  Cardiovascular:     Rate and Rhythm: Normal rate and regular rhythm.     Heart sounds: Normal heart sounds.  Pulmonary:     Effort: Pulmonary effort is normal.     Breath sounds: Normal breath sounds.  Abdominal:     General: Abdomen is flat.     Palpations: Abdomen is soft.     Tenderness: There is no abdominal tenderness.  Musculoskeletal:        General: Normal range of motion.     Cervical back: Normal range of motion.  Skin:    General: Skin is warm and dry.  Neurological:     Mental Status: He is alert and oriented to person, place, and time.     Cranial Nerves: No cranial nerve deficit.     Sensory: No sensory deficit.     Coordination: Coordination normal.     Comments: No drift in bilateral upper extremities Mild drift in right lower extremity 5 out of 5 strength in plantar/dorsi flexion bilaterally Normal speech  Psychiatric:        Mood and Affect: Mood normal.        Behavior: Behavior normal.     ED Results / Procedures / Treatments   Labs (all labs ordered are listed, but only abnormal results are displayed) Labs Reviewed  COMPREHENSIVE METABOLIC PANEL WITH GFR - Abnormal; Notable for the following components:      Result Value   Glucose, Bld 228 (*)    Creatinine, Ser 1.25 (*)    AST 14 (*)     GFR, Estimated 57 (*)    All other components within normal limits  CBC - Abnormal; Notable for the following components:   RDW 15.6 (*)    All other components within normal limits  CBG MONITORING, ED - Abnormal; Notable for the following components:   Glucose-Capillary 267 (*)    All other components within normal limits    EKG EKG Interpretation Date/Time:  Friday Dec 14 2023 09:34:44 EDT Ventricular Rate:  77 PR Interval:  184 QRS Duration:  112 QT Interval:  424 QTC Calculation: 479 R Axis:   44  Text Interpretation: Normal sinus rhythm with sinus arrhythmia Anterior infarct , age undetermined T wave abnormality, consider lateral ischemia Abnormal ECG No significant change since last tracing Confirmed by Celesta Coke ([id number]) on 12/14/2023 10:13:03 AM  Radiology MR Brain Wo Contrast (neuro protocol) Result Date: 12/14/2023 CLINICAL DATA:  Provided history: Neuro deficit, acute, stroke suspected. Additional history provided: Right leg weakness. Dizziness. EXAM: MRI HEAD WITHOUT CONTRAST TECHNIQUE: Multiplanar, multiecho pulse sequences of the brain and surrounding structures were obtained without intravenous contrast. COMPARISON:  Non-contrast head CT and CT angiogram head/neck 12/14/2023. Brain MRI 06/07/2020. FINDINGS: Brain: Mild generalized cerebral atrophy. Small patchy acute cortically-based infarcts within the posterior left frontal lobe and left parietal lobe (ACA vascular territory). Background mild multifocal T2 FLAIR hyperintense signal abnormality within the cerebral white matter, nonspecific but compatible with chronic small vessel ischemic disease. These findings are similar to the prior MRI of 06/07/2020. Left parieto-occipital chronic microhemorrhage, new from the prior MRI. No evidence of an intracranial mass. No extra-axial fluid collection. No midline shift. Vascular: A severe, near-occlusive stenosis is noted within the left anterior cerebral artery A2 segment on the  CTA performed earlier today. Please refer to this prior examination for additional vascular findings. Skull and upper cervical spine: No focal worrisome marrow lesion. Incompletely assessed cervical spondylosis. Sinuses/Orbits: No mass or acute finding within the imaged orbits. No significant paranasal sinus disease. Other: Small atlantooccipital joint effusion on the left. Impression #1 called by telephone at the time of interpretation on 12/14/2023 at 2:00 pm to provider [facility] , who verbally acknowledged these results. IMPRESSION: 1. Small patchy acute cortically-based infarcts within the posterior left frontal lobe and left parietal lobe (ACA vascular territory). A severe, near-occlusive stenosis is noted within the left anterior cerebral artery A2 segment on the CTA performed earlier today. 2. Background parenchymal atrophy and chronic small vessel ischemic disease, as described. Electronically Signed   By: Bascom Lily D.O.   On: 12/14/2023 14:03   CT ANGIO HEAD NECK W WO CM Result Date: 12/14/2023 CLINICAL DATA:  83 year old male neurologic deficit. Right lower extremity weakness, dizziness. EXAM: CT ANGIOGRAPHY HEAD AND NECK WITH AND WITHOUT CONTRAST TECHNIQUE: Multidetector CT imaging of the head and neck was performed using the standard protocol during bolus administration of intravenous contrast. Multiplanar CT image reconstructions and MIPs were obtained to evaluate the vascular anatomy. Carotid stenosis measurements (when applicable) are obtained utilizing NASCET criteria, using the distal internal carotid diameter as the denominator. RADIATION DOSE REDUCTION: This exam was performed according to the departmental dose-optimization program which includes automated exposure control, adjustment of the mA and/or kV according to patient size and/or use of iterative reconstruction technique. CONTRAST:  75mL OMNIPAQUE  IOHEXOL  350 MG/ML SOLN COMPARISON:  Head CT 02/12/2020.  Brain MRI 06/07/2020.  FINDINGS: CT HEAD Brain: Cerebral volume remains normal for age. No midline shift, ventriculomegaly, mass effect, evidence of mass lesion, intracranial hemorrhage or evidence of cortically based acute infarction. Gray-white matter differentiation is within normal limits throughout the brain. Match that patchy bilateral cerebral white matter hypodensity appears moderate for age, not significantly changed from 2021. Calvarium and skull base: Chronic right TMJ degeneration. Calvarium intact. No acute osseous abnormality identified. Paranasal sinuses: Visualized paranasal sinuses and mastoids are clear. Orbits: Postoperative changes to the right globe. No gaze deviation. Visualized scalp soft tissues are within normal  limits. CTA NECK Skeleton: Partially visible sternotomy. Cervical spine degeneration. No acute osseous abnormality identified. Upper chest: Evidence of previous CABG but there is a rounded intermediate density 4.4 cm mass partially visible in the anterior superior mediastinum, abutting the ventral proximal arch on series 7, image 169. No superior mediastinal mass effect is evident. No surrounding lymphadenopathy. Other neck: Nonvascular neck soft tissue spaces are within normal limits. Aortic arch: Calcified aortic atherosclerosis. CABG. Three vessel arch. Right carotid system: Tortuous brachiocephalic artery and right CCA origin with only mild plaque and no stenosis. Mild plaque at the right ICA origin and bulb, tortuosity, no stenosis. Left carotid system: Similar tortuosity. Minimal plaque. No stenosis. Vertebral arteries: Proximal right subclavian artery and right vertebral artery origin are patent with no significant plaque or stenosis. Late entry of the right vertebral into the cervical transverse foramen, and appears non dominant on series 5, image 244. Right vertebral remains patent to the skull base with no extracranial stenosis. See intracranial findings below. Proximal left subclavian artery  plaque without stenosis. Calcified plaque at the left vertebral artery origin without stenosis. Dominant left vertebral artery is tortuous and patent to the skull base with no significant stenosis. CTA HEAD Posterior circulation: Right vertebral V4 segment is occluded distal to the right PICA origin which remains faintly enhancing. This appears similar to the 2021 brain MRI flow void appearance. Dominant left vertebral artery supplies the basilar without stenosis. Left PICA origin is patent. Patent basilar artery without stenosis. Mild basilar artery irregularity. Patent SCA and PCA origins. Tortuous P1 segments. Bilateral PCA branches are patent with no significant stenosis. Anterior circulation: Patent ICA siphons with tortuosity. Mild siphon calcified plaque. No stenosis. Patent carotid termini. Left posterior communicating artery is present, normal. The right posterior communicating is diminutive or absent. Patent carotid termini, MCA and ACA origins. Dominant right A1. Tortuous A1 segments and fenestrated, ectatic anterior communicating artery. But no discrete ACA aneurysm. Bilateral ACA branches are within normal limits., bilateral ACA branches are patent. The right ACA remains dominant. There is mild left A3 stenosis on series 12, image 25. Left MCA M1 segment and bifurcation are tortuous, patent without stenosis. Right MCA M1 segment and bifurcation are patent and tortuous without stenosis. Bilateral MCA branches appear patent with no significant stenosis. Venous sinuses: Early contrast timing, not evaluated. Anatomic variants: Dominant left vertebral artery which supplies the basilar. Dominant right ACA A1. Fetal type left PCA origin. Review of the MIP images confirms the above findings IMPRESSION: 1. 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 Recommend follow-up CT Chest or CTA to further characterize. Aortic  Atherosclerosis (ICD10-I70.0). 2. 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 3. No acute intracranial abnormality by CT. Moderate for age cerebral white matter changes, stable since 2021. Electronically Signed   By: Marlise Simpers M.D.   On: 12/14/2023 11:49    Procedures Procedures    Medications Ordered in ED Medications  clopidogrel (PLAVIX) tablet 300 mg (has no administration in time range)  clopidogrel (PLAVIX) tablet 75 mg (has no administration in time range)  iohexol  (OMNIPAQUE ) 350 MG/ML injection 75 mL (75 mLs Intravenous Contrast Given 12/14/23 1135)    ED Course/ Medical Decision Making/ A&P Clinical Course as of 12/14/23 1515  Fri Dec 14, 2023  1105 Mild increased Cr from baseline, hypergylcemia without DKA. [VK]  1214 CTA without acute occlusion, incidentally shows a mediastinal mass and is recommended outpatient follow up. Will get MRI to further evaluate for CVA. [VK]  1401 I received a call from radiology - small acute infarct in L ACA territory, also noted severe near occlusion L ACA A2 segment on CTA that was not previously reported. Will consult neurology. [VK]  1513 I spoke with Dr. Alecia Ames with neurology who recommended admission for acute stroke work up. [VK]    Clinical Course User Index [VK] Kingsley, Timmya Blazier K, DO                                 Medical Decision Making This patient presents to the ED with chief complaint(s) of RLE weakness, dizziness with pertinent past medical history of CAD, hypertension, diabetes which further complicates the presenting complaint. The complaint involves an extensive differential diagnosis and also carries with it a high risk of complications and morbidity.    The differential diagnosis includes CVA, TIA, hypo or hyperglycemia, electrolyte  derangement, no back pain or pain in the leg making peripheral neuropathy or sciatica less likely  Additional history obtained: Additional history obtained from spouse Records reviewed outpatient cardiology records  ED Course and Reassessment: On patient's arrival he is hemodynamically stable in no acute distress.  Was initially evaluated by provider in triage and had EKG, labs and CT imaging ordered.  Glucose is mildly elevated on arrival here, EKG showed normal sinus rhythm without acute ischemic changes.  He does have some mild drift in his right lower extremity.  Symptoms have been ongoing for the last week so will undergo subacute stroke workup.  He will be closely reassessed.  Independent labs interpretation:  The following labs were independently interpreted: within normal range  Independent visualization of imaging: - I independently visualized the following imaging with scope of interpretation limited to determining acute life threatening conditions related to emergency care: CTA Head/Neck, MRI brain, which revealed acute CVA in L anterior and frontal lobe, severe stenosis of L ACA A2 segment  Consultation: - Consulted or discussed management/test interpretation w/ external professional: neurology, hospitalist  Consideration for admission or further workup: patient requires admission for acute stroke. Social Determinants of health: N/A    Amount and/or Complexity of Data Reviewed Radiology: ordered.  Risk Decision regarding hospitalization.          Final Clinical Impression(s) / ED Diagnoses Final diagnoses:  Cerebrovascular accident (CVA), unspecified mechanism [facility])    Rx / DC Orders ED Discharge Orders     None         Kingsley, Cleophas Yoak K, DO 12/14/23 1515

## 2023-12-14 NOTE — ED Provider Triage Note (Signed)
 Emergency Medicine Provider Triage Evaluation Note  James Braun , a 83 y.o. male  was evaluated in triage.  Pt complains of right leg weakness and dizziness..  Review of Systems  Positive: Dizziness and weakness Negative: Back pain  Physical Exam  BP (!) 149/94 (BP Location: Left Arm)   Pulse 78   Temp 98.1 F (36.7 C) (Oral)   Resp 16   Ht 5\' 8"  (1.727 m)   Wt 63.5 kg   SpO2 94%   BMI 21.29 kg/m  Awake and appropriate.  Good strength in all extremities but potentially decreased in right lower extremity compared to left.  Medical Decision Making  Medically screening exam initiated at 9:59 AM.  Appropriate orders placed.  James Braun was informed that the remainder of the evaluation will be completed by another provider, this initial triage assessment does not replace that evaluation, and the importance of remaining in the ED until their evaluation is complete.  Patient with a week of dizziness and difficulty using the right leg.  Differential diagnosis does include stroke.  Not a TNK candidate due to time of onset of a week ago we will get CTA to begin with and moved to room.   James Arias, MD 12/14/23 [phone]

## 2023-12-14 NOTE — ED Triage Notes (Signed)
 Pt. Stated, James Braun had right leg weakness for a week and half with some dizziness. Denies any other symptoms .

## 2023-12-14 NOTE — H&P (Addendum)
 History and Physical    Patient: James Braun:[id number] DOB: [birth year] DOA: 12/14/2023 DOS: the patient was seen and examined on 12/14/2023 PCP: Helaine Llanos, MD  Patient coming from: Home  Chief Complaint:  Chief Complaint  Patient presents with   Weakness   HPI: James Braun is a 83 y.o. male with medical history significant of history of DM-2, HTN, HFmrEF, CAD s/p CABG in 2021 followed by brief postoperative A-fib (currently not on any anticoagulation) who presented with right leg weakness for almost a week  Per patient-approximately 8 days back or so-he woke up in the morning with right leg weakness.  He felt like his leg was giving out on him when he was walking.  He describes a couple of falls due to right leg weakness.  He also describes a sensation of dizziness and feeling off balance and needing to hold onto the walls to walk.  The symptoms initially improved but then have come back intermittently throughout the week.  He subsequently spoke with his PCPs office and was advised to come to the emergency room for further evaluation.    He denies any slurred speech.  Denies any upper extremity weakness.  He subsequently presented to the ED today-where further neuroimaging revealed an acute CVA.  Neurology was consulted-[facility] was asked to admit this patient for further evaluation and treatment.  Patient denies any headache/fever Denies any chest pain or shortness of breath Denies any abdominal pain Denies any nausea, vomiting or diarrhea Denies any hematuria/dysuria   Review of Systems: As mentioned in the history of present illness. All other systems reviewed and are negative. Past Medical History:  Diagnosis Date   BPH (benign prostatic hypertrophy)    Cancer (HCC)    melanoma   Coronary artery disease    History of kidney stones    History of melanoma excision OCT 2010   Hyperlipidemia    Hypertension    Left ureteral calculus    Nephrolithiasis    bilateral    Type 2 diabetes mellitus (HCC)    Past Surgical History:  Procedure Laterality Date   CARDIAC CATHETERIZATION     CHOLECYSTECTOMY  01/1999   CORNEAL TRANSPLANT Right 2023   CORONARY ARTERY BYPASS GRAFT N/A 11/28/2019   Procedure: CORONARY ARTERY BYPASS GRAFTING (CABG) x3, using left internal mammary artery and Saphenous vein harvested endoscopically;  Surgeon: Heriberto London, MD;  Location: [facility] OR;  Service: Open Heart Surgery;  Laterality: N/A;   CYSTOSCOPY WITH STENT PLACEMENT Left 11/14/2012   Procedure: CYSTOSCOPY WITH STENT PLACEMENT;  Surgeon: Trent Frizzle, MD;  Location: [facility];  Service: Urology;  Laterality: Left;   CYSTOSCOPY/RETROGRADE/URETEROSCOPY/STONE EXTRACTION WITH BASKET Left 11/14/2012   Procedure: CYSTOSCOPY/RETROGRADE/URETEROSCOPY/STONE EXTRACTION WITH BASKET;  Surgeon: Trent Frizzle, MD;  Location: [facility];  Service: Urology;  Laterality: Left;   EXTRACORPOREAL SHOCK WAVE LITHOTRIPSY  01/1999   X2   HOLMIUM LASER APPLICATION Left 11/14/2012   Procedure: HOLMIUM LASER APPLICATION;  Surgeon: Trent Frizzle, MD;  Location: [facility];  Service: Urology;  Laterality: Left;   LEFT HEART CATH AND CORONARY ANGIOGRAPHY N/A 11/27/2019   Procedure: LEFT HEART CATH AND CORONARY ANGIOGRAPHY;  Surgeon: Avanell Leigh, MD;  Location: [facility];  Service: Cardiovascular;  Laterality: N/A;   LEFT URETEROSCOPIC STONE EXTRACTION  02/22/2007   NEPHROLITHOTOMY Right 09/28/2010   percutaneous   ORIF CLAVICLE FRACTURE  2006   PROSTATE BIOPSY  1998   neg  SHOULDER ARTHROSCOPY  07/2013   Dr Genevive Ket   TEE WITHOUT CARDIOVERSION N/A 11/28/2019   Procedure: TRANSESOPHAGEAL ECHOCARDIOGRAM (TEE);  Surgeon: Matt Song, Donata Fryer, MD;  Location: [facility] OR;  Service: Open Heart Surgery;  Laterality: N/A;   TONSILLECTOMY     Social History:  reports that he has never smoked. He has never been exposed to tobacco smoke. He has  never used smokeless tobacco. He reports that he does not drink alcohol and does not use drugs.  Allergies  Allergen Reactions   Lipitor [Atorvastatin] Other (See Comments)    myalgias   Lisinopril    Morphine And Codeine Other (See Comments)     hypotension   Levofloxacin Rash   Septra [Sulfamethoxazole-Trimethoprim] Rash   Sulfamethoxazole Rash   Sulfamethoxazole-Trimethoprim Rash    Family History  Problem Relation Age of Onset   Hypertension Mother    Diabetes Father    Hypertension Father    Colon polyps Brother    Coronary artery disease Neg Hx    Cancer Neg Hx    Esophageal cancer Neg Hx    Stomach cancer Neg Hx    Rectal cancer Neg Hx     Prior to Admission medications   Medication Sig Start Date End Date Taking? Authorizing Provider  Acetaminophen  (TYLENOL  8 HOUR ARTHRITIS PAIN PO) Take by mouth.    [provider]  alum hydroxide-mag trisilicate (GAVISCON) 80-20 MG CHEW chewable tablet Chew 2 tablets by mouth 3 (three) times daily as needed for indigestion or heartburn.    [provider]  aspirin  EC 81 MG tablet Take 1 tablet (81 mg total) by mouth daily. Swallow whole. 12/19/21   Marlyse Single T, PA-C  Cyanocobalamin  (VITAMIN B-12) 1000 MCG SUBL Place 1,000 mcg under the tongue. 1000 mcg SL daily    [provider]  empagliflozin  (JARDIANCE ) 10 MG TABS tablet Take 1 tablet (10 mg total) by mouth daily before breakfast. 07/20/23   Helaine Llanos, MD  ferrous sulfate 325 (65 FE) MG tablet Take 325 mg by mouth daily with breakfast.    [provider]  finasteride  (PROSCAR ) 5 MG tablet Take 1 tablet (5 mg total) by mouth daily. 11/19/23   Helaine Llanos, MD  LUMIGAN 0.01 % SOLN Place 1 drop into the left eye daily. 09/23/21   [provider]  metFORMIN  (GLUCOPHAGE -XR) 500 MG 24 hr tablet Take 1 tablet (500 mg total) by mouth 2 (two) times daily. 07/25/23   Helaine Llanos, MD  Multiple Vitamin (MULTIVITAMIN) tablet Take 1  tablet by mouth daily.    [provider]  Nutritional Supplements (NUTRITIONAL SUPPLEMENT PO) Take by mouth. Curamin    [provider]  omeprazole  (PRILOSEC) 20 MG capsule TAKE 1 CAPSULE BY MOUTH EVERY DAY 05/11/23   Helaine Llanos, MD  OVER THE COUNTER MEDICATION HA Joint Formula: Vit C, Magnesium , Collagen, Quercetin, Olive Extract, Boswellia    [provider]  OVER THE COUNTER MEDICATION Joint Supplement    [provider]  Potassium Citrate  15 MEQ (1620 MG) TBCR Take 1 tablet by mouth 3 (three) times daily. 02/26/23   Helaine Llanos, MD  sacubitril -valsartan  (ENTRESTO ) 24-26 MG Take 1 tablet by mouth 2 (two) times daily. 05/17/23   Weaver, Scott T, PA-C  sildenafil  (REVATIO ) 20 MG tablet TAKE 3-5 TABLETS BY MOUTH EVERY DAY AS NEEDED 10/08/23   Helaine Llanos, MD  SIMBRINZA 1-0.2 % SUSP Place 1 drop into the left eye 2 (two) times daily.  03/29/21   [provider]  timolol  (TIMOPTIC ) 0.5 % ophthalmic solution Place 1 drop into both eyes 2 (two) times daily.  09/24/18   [provider]  Turmeric, Fermin Hove, (CURCUMIN) POWD by Does not apply route.    [provider]  Vitamin D3 (VITAMIN D) 25 MCG tablet Take 1 tablet by mouth 2 (two) times daily.    [provider]    Physical Exam: Vitals:   12/14/23 0932 12/14/23 0951 12/14/23 1236 12/14/23 1345  BP: (!) 149/94  (!) 135/91 (!) 155/94  Pulse: 78  82 82  Resp: 16  18 17   Temp: 98.1 F (36.7 C)   97.9 F (36.6 C)  TempSrc: Oral   Oral  SpO2: 94%  99% 100%  Weight:  63.5 kg    Height:  5\' 8"  (1.727 m)     Gen Exam:Alert awake-not in any distress HEENT:atraumatic, normocephalic Chest: B/L clear to auscultation anteriorly CVS:S1S2 regular Abdomen:soft non tender, non distended Extremities:no edema Neurology: Non focal Skin: no rash  Data Reviewed:     Latest Ref Rng & Units 12/14/2023   10:22 AM 01/24/2023    7:49 AM 07/25/2022    7:46 AM  CBC   WBC 4.0 - 10.5 K/uL 6.2  5.3  5.0   Hemoglobin 13.0 - 17.0 g/dL 40.9  81.1  91.4   Hematocrit 39.0 - 52.0 % 48.1  45.4  47.9   Platelets 150 - 400 K/uL 344  307.0  333.0         Latest Ref Rng & Units 12/14/2023   10:22 AM 01/24/2023    7:49 AM 07/25/2022    7:46 AM  BMP  Glucose 70 - 99 mg/dL 782  956  213   BUN 8 - 23 mg/dL 23  23  26    Creatinine 0.61 - 1.24 mg/dL 0.86  5.78  4.69   Sodium 135 - 145 mmol/L 139  143  144   Potassium 3.5 - 5.1 mmol/L 3.9  3.5  4.1   Chloride 98 - 111 mmol/L 101  104  103   CO2 22 - 32 mmol/L 27  30  32   Calcium  8.9 - 10.3 mg/dL 9.4  9.1  9.3     12 EKG: Normal sinus rhythm  CTA head/neck: No LVO.  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   Assessment and Plan: Acute CVA Hardly any leg weakness on my exam Unclear whether this is embolic (prior history of A-fib around CABG postoperatively) or from intracranial atherosclerotic disease. Allow permissive hypertension For now will place on DAPT until neurology evaluation Echo/A1c/lipids will be ordered PT/OT Await further input from neurology team  CAD s/p CABG 2021 Continue antiplatelets  Postoperative A-fib after CABG in 2021 See above Twelve-lead EKG with NSR Telemetry monitoring  Chronic HFmrEF Euvolemic Hold Entresto /Jardiance -allow permissive hypertension for a few days  DM-2 Hold all oral hypoglycemic agents SSI  BPH Finasteride  Frequent bladder scans  GERD PPI  4 cm rounded/intermitted density anterior superior mediastinal mass Incidental finding on CTA head/neck Dedicated CT chest will be ordered to evaluate further-will hold off on ordering today to reduce contrast burden-will check electrolytes in the next day or so and order dedicated CT.   Advance Care Planning:   Code Status: Full Code   Consults: Neurology  Family Communication: Spouse is at  bedside  Severity of Illness: The appropriate  patient status for this patient is INPATIENT. Inpatient status is judged to be reasonable and necessary in order to provide the required intensity of service to ensure the patient's safety. The patient's presenting symptoms, physical exam findings, and initial radiographic and laboratory data in the context of their chronic comorbidities is felt to place them at high risk for further clinical deterioration. Furthermore, it is not anticipated that the patient will be medically stable for discharge from the hospital within 2 midnights of admission.   * I certify that at the point of admission it is my clinical judgment that the patient will require inpatient hospital care spanning beyond 2 midnights from the point of admission due to high intensity of service, high risk for further deterioration and high frequency of surveillance required.*  Author: Kimberly Penna, MD 12/14/2023 4:38 PM  For on call review [email or web address].

## 2023-12-14 NOTE — ED Notes (Signed)
 Patient transported to MRI

## 2023-12-14 NOTE — Telephone Encounter (Signed)
 Reassuring CT angio of head/neck Awaiting head MRI Will follow progress

## 2023-12-14 NOTE — ED Notes (Signed)
 This nurse called 5W to notify that patient is on their way up.

## 2023-12-15 ENCOUNTER — Inpatient Hospital Stay (HOSPITAL_COMMUNITY)

## 2023-12-15 DIAGNOSIS — I639 Cerebral infarction, unspecified: Secondary | ICD-10-CM | POA: Diagnosis not present

## 2023-12-15 DIAGNOSIS — I6389 Other cerebral infarction: Secondary | ICD-10-CM | POA: Diagnosis not present

## 2023-12-15 DIAGNOSIS — I63422 Cerebral infarction due to embolism of left anterior cerebral artery: Secondary | ICD-10-CM | POA: Diagnosis not present

## 2023-12-15 DIAGNOSIS — Z7982 Long term (current) use of aspirin: Secondary | ICD-10-CM

## 2023-12-15 DIAGNOSIS — I509 Heart failure, unspecified: Secondary | ICD-10-CM

## 2023-12-15 DIAGNOSIS — I69391 Dysphagia following cerebral infarction: Secondary | ICD-10-CM | POA: Diagnosis not present

## 2023-12-15 DIAGNOSIS — I110 Hypertensive heart disease with heart failure: Secondary | ICD-10-CM

## 2023-12-15 DIAGNOSIS — Z8679 Personal history of other diseases of the circulatory system: Secondary | ICD-10-CM

## 2023-12-15 DIAGNOSIS — E785 Hyperlipidemia, unspecified: Secondary | ICD-10-CM

## 2023-12-15 DIAGNOSIS — E119 Type 2 diabetes mellitus without complications: Secondary | ICD-10-CM | POA: Diagnosis not present

## 2023-12-15 DIAGNOSIS — I2510 Atherosclerotic heart disease of native coronary artery without angina pectoris: Secondary | ICD-10-CM | POA: Diagnosis not present

## 2023-12-15 DIAGNOSIS — J9859 Other diseases of mediastinum, not elsewhere classified: Secondary | ICD-10-CM | POA: Diagnosis not present

## 2023-12-15 LAB — BASIC METABOLIC PANEL WITH GFR
Anion gap: 12 (ref 5–15)
BUN: 24 mg/dL — ABNORMAL HIGH (ref 8–23)
CO2: 24 mmol/L (ref 22–32)
Calcium: 9.1 mg/dL (ref 8.9–10.3)
Chloride: 106 mmol/L (ref 98–111)
Creatinine, Ser: 1.1 mg/dL (ref 0.61–1.24)
GFR, Estimated: >60 mL/min (ref >60)
Glucose, Bld: 103 mg/dL — ABNORMAL HIGH (ref 70–99)
Potassium: 3.3 mmol/L — ABNORMAL LOW (ref 3.5–5.1)
Sodium: 142 mmol/L (ref 135–145)

## 2023-12-15 LAB — HEMOGLOBIN A1C
Hgb A1c MFr Bld: 7.6 % — ABNORMAL HIGH (ref 4.8–5.6)
Mean Plasma Glucose: 171.42 mg/dL

## 2023-12-15 LAB — ECHOCARDIOGRAM COMPLETE
AR max vel: 1.7 cm2
AV Area VTI: 1.53 cm2
AV Area mean vel: 1.75 cm2
AV Mean grad: 6.5 mmHg
AV Peak grad: 12.6 mmHg
Ao pk vel: 1.78 m/s
Area-P 1/2: 3.53 cm2
Calc EF: 33.8 %
Height: 68 in
P 1/2 time: 413 ms
S' Lateral: 3.3 cm
Single Plane A2C EF: 40.6 %
Single Plane A4C EF: 26.7 %
Weight: 2240 [oz_av]

## 2023-12-15 LAB — LIPID PANEL
Cholesterol: 200 mg/dL (ref 0–200)
HDL: 33 mg/dL — ABNORMAL LOW (ref >40)
LDL Cholesterol: 124 mg/dL — ABNORMAL HIGH (ref 0–99)
Total CHOL/HDL Ratio: 6.1 ratio
Triglycerides: 215 mg/dL — ABNORMAL HIGH (ref <150)
VLDL: 43 mg/dL — ABNORMAL HIGH (ref 0–40)

## 2023-12-15 LAB — GLUCOSE, CAPILLARY
Glucose-Capillary: 146 mg/dL — ABNORMAL HIGH (ref 70–99)
Glucose-Capillary: 94 mg/dL (ref 70–99)
Glucose-Capillary: 214 mg/dL — ABNORMAL HIGH (ref 70–99)
Glucose-Capillary: 144 mg/dL — ABNORMAL HIGH (ref 70–99)

## 2023-12-15 LAB — VITAMIN B12: Vitamin B-12: 2318 pg/mL — ABNORMAL HIGH (ref 180–914)

## 2023-12-15 MED ORDER — IOHEXOL 350 MG/ML SOLN
50.0000 mL | Freq: Once | INTRAVENOUS | Status: AC | PRN
Start: 2023-12-15 — End: 2023-12-15
  Administered 2023-12-15: 50 mL via INTRAVENOUS

## 2023-12-15 MED ORDER — POTASSIUM CHLORIDE CRYS ER 20 MEQ PO TBCR
40.0000 meq | EXTENDED_RELEASE_TABLET | Freq: Once | ORAL | Status: AC
  Administered 2023-12-15: 40 meq via ORAL
  Filled 2023-12-15: qty 2

## 2023-12-15 MED ORDER — EMPAGLIFLOZIN 10 MG PO TABS
10.0000 mg | ORAL_TABLET | Freq: Every day | ORAL | Status: DC
  Administered 2023-12-16: 10 mg via ORAL
  Filled 2023-12-15: qty 1

## 2023-12-15 MED ORDER — EZETIMIBE 10 MG PO TABS
10.0000 mg | ORAL_TABLET | Freq: Every day | ORAL | Status: DC
  Administered 2023-12-15 – 2023-12-16 (×2): 10 mg via ORAL
  Filled 2023-12-15 (×2): qty 1

## 2023-12-15 NOTE — Progress Notes (Addendum)
 Subjective No complaints  Objective Awake/alert Speech clear Nonfocal exam  Labs/imaging MRI brain: Patchy infarct posterior left frontal lobe/left parietal lobe. CT angio head/neck: Negative for LVO-chronic appearing occlusion of distal right vertebral artery V4 segment A1c: 7.6 LDL: 124 Echo: Pending CT chest: Pending  Assessment/plan: Acute CVA Unclear whether this is embolic (history of perioperative A-fib post CABG) or thromboembolic from intracranial atherosclerosis. Workup as above Nonfocal exam Remains on aspirin /Plavix  Intolerant to statins in the past-will place on Zetia-inquiring by outpatient Repatha injections. Echo pending Await formal evaluation by stroke MD.   CAD s/p CABG 2021 Continue antiplatelets   Postoperative A-fib after CABG in 2021 Telemonitoring negative for A-fib overnight.   Chronic HFmrEF Euvolemic Resume Entresto  on discharge Resume Jardiance .    DM-2 CBG stable on SSI Resume all oral hypoglycemics on discharge.   BPH Finasteride    GERD PPI   4 cm rounded/intermitted density anterior superior mediastinal mass Awaiting dedicated CT chest today.   Addendum: CT Chest reviewed with CT surgery-Dr Hendrickson-given that thrombose pseudoaneurysm is in the differential in this patient with a acute CVA-ordering a MRI Chest w/out contrast.   DVT prophylaxis SQ Lovenox   Consultants Neurology  Disposition Home after workup/formal neurology evaluation complete.

## 2023-12-15 NOTE — Evaluation (Signed)
 Speech Language Pathology Evaluation Patient Details Name: James Braun MRN: [id number] DOB: June 18, 1941 Today's Date: 12/15/2023 Time: 2841-3244 SLP Time Calculation (min) (ACUTE ONLY): 15 min  Problem List:  Patient Active Problem List   Diagnosis Date Noted   Acute CVA (cerebrovascular accident) (HCC) 12/14/2023   Diabetes mellitus treated with oral medication (HCC) 08/06/2023   Porokeratosis 10/12/2022   HFmrEF (heart failure with mildly reduced ejection fraction) (HCC) 12/18/2021   Calculus of kidney 04/07/2021   Iron deficiency anemia due to chronic blood loss 09/27/2020   Statin myopathy 02/10/2020   Heart failure, systolic, chronic (HCC) 12/03/2019   PAF (paroxysmal atrial fibrillation) (HCC) 12/03/2019   S/P CABG x 3 11/28/2019   CAD in native artery 11/27/2019   GERD (gastroesophageal reflux disease) 06/30/2019   Anemia in chronic kidney disease (CKD) 09/05/2018   Counseling regarding advanced directives 02/13/2014   S/P arthroscopy of shoulder 08/05/2013   Onychomycosis 04/25/2013   Routine general medical examination at a health care facility 01/04/2011   BPH without obstruction/lower urinary tract symptoms 07/13/2010   Controlled type 2 diabetes mellitus with diabetic nephropathy (HCC) 01/11/2010   Hyperlipemia 01/11/2010   Essential hypertension, benign 01/11/2010   NEPHROLITHIASIS, HX OF 01/11/2010   Past Medical History:  Past Medical History:  Diagnosis Date   BPH (benign prostatic hypertrophy)    Cancer (HCC)    melanoma   Coronary artery disease    History of kidney stones    History of melanoma excision OCT 2010   Hyperlipidemia    Hypertension    Left ureteral calculus    Nephrolithiasis    bilateral   Type 2 diabetes mellitus (HCC)    Past Surgical History:  Past Surgical History:  Procedure Laterality Date   CARDIAC CATHETERIZATION     CHOLECYSTECTOMY  01/1999   CORNEAL TRANSPLANT Right 2023   CORONARY ARTERY BYPASS GRAFT N/A 11/28/2019    Procedure: CORONARY ARTERY BYPASS GRAFTING (CABG) x3, using left internal mammary artery and Saphenous vein harvested endoscopically;  Surgeon: Heriberto London, MD;  Location: [facility] OR;  Service: Open Heart Surgery;  Laterality: N/A;   CYSTOSCOPY WITH STENT PLACEMENT Left 11/14/2012   Procedure: CYSTOSCOPY WITH STENT PLACEMENT;  Surgeon: Trent Frizzle, MD;  Location: [facility];  Service: Urology;  Laterality: Left;   CYSTOSCOPY/RETROGRADE/URETEROSCOPY/STONE EXTRACTION WITH BASKET Left 11/14/2012   Procedure: CYSTOSCOPY/RETROGRADE/URETEROSCOPY/STONE EXTRACTION WITH BASKET;  Surgeon: Trent Frizzle, MD;  Location: [facility];  Service: Urology;  Laterality: Left;   EXTRACORPOREAL SHOCK WAVE LITHOTRIPSY  01/1999   X2   HOLMIUM LASER APPLICATION Left 11/14/2012   Procedure: HOLMIUM LASER APPLICATION;  Surgeon: Trent Frizzle, MD;  Location: [facility];  Service: Urology;  Laterality: Left;   LEFT HEART CATH AND CORONARY ANGIOGRAPHY N/A 11/27/2019   Procedure: LEFT HEART CATH AND CORONARY ANGIOGRAPHY;  Surgeon: Avanell Leigh, MD;  Location: [facility];  Service: Cardiovascular;  Laterality: N/A;   LEFT URETEROSCOPIC STONE EXTRACTION  02/22/2007   NEPHROLITHOTOMY Right 09/28/2010   percutaneous   ORIF CLAVICLE FRACTURE  2006   PROSTATE BIOPSY  1998   neg   SHOULDER ARTHROSCOPY  07/2013   Dr Genevive Ket   TEE WITHOUT CARDIOVERSION N/A 11/28/2019   Procedure: TRANSESOPHAGEAL ECHOCARDIOGRAM (TEE);  Surgeon: Matt Song, Donata Fryer, MD;  Location: [facility] OR;  Service: Open Heart Surgery;  Laterality: N/A;   TONSILLECTOMY     HPI:  James Braun is an 83 yo male presenting to ED 5/30 with RLE  weakness x8 days and dizziness. MRI showed posterior L frontal lobe and L parietal lobe infarcts. CTA shows near-occlusive stenosis of L ACA. PMH includes CAD s/p CABG, HTN, DM   Assessment / Plan / Recommendation Clinical Impression  Pt denies acute concerns  with cognitive-linguistic function. He scored 27/30 on the SLUMS, which is considered WFL. SLP f/u is not clinically indicated at this time, will sign off.    SLP Assessment  SLP Recommendation/Assessment: Patient does not need any further Speech Lanaguage Pathology Services SLP Visit Diagnosis: Cognitive communication deficit (R41.841)    Recommendations for follow up therapy are one component of a multi-disciplinary discharge planning process, led by the attending physician.  Recommendations may be updated based on patient status, additional functional criteria and insurance authorization.    Follow Up Recommendations  No SLP follow up    Assistance Recommended at Discharge  None  Functional Status Assessment Patient has not had a recent decline in their functional status  Frequency and Duration           SLP Evaluation Cognition  Overall Cognitive Status: Within Functional Limits for tasks assessed       Comprehension  Auditory Comprehension Overall Auditory Comprehension: Appears within functional limits for tasks assessed    Expression Expression Primary Mode of Expression: Verbal Verbal Expression Overall Verbal Expression: Appears within functional limits for tasks assessed Written Expression Dominant Hand: Right   Oral / Motor  Oral Motor/Sensory Function Overall Oral Motor/Sensory Function: Within functional limits Motor Speech Overall Motor Speech: Appears within functional limits for tasks assessed            Amil Kale, M.A., CCC-SLP Speech Language Pathology, Acute Rehabilitation Services  Secure Chat preferred [phone]  12/15/2023, 2:28 PM

## 2023-12-15 NOTE — Plan of Care (Signed)
   Problem: Ischemic Stroke/TIA Tissue Perfusion: Goal: Complications of ischemic stroke/TIA will be minimized Outcome: Progressing

## 2023-12-15 NOTE — Progress Notes (Signed)
 SLP Cancellation Note  Patient Details Name: James Braun MRN: [id number] DOB: Aug 03, 1940   Cancelled treatment:       Reason Eval/Treat Not Completed: SLP screened, pt passed the swallow screen and RN denies concerns with swallowing. No needs identified, will sign off for swallowing but f/u to complete a cognitive-linguistic evaluation as able.    Amil Kale, M.A., CCC-SLP Speech Language Pathology, Acute Rehabilitation Services  Secure Chat preferred [phone]  12/15/2023, 11:37 AM

## 2023-12-15 NOTE — Evaluation (Signed)
 Physical Therapy Evaluation Patient Details Name: James Braun MRN: [id number] DOB: 01-02-1941 Today's Date: 12/15/2023  History of Present Illness  Pt is an 83 yo male presenting to [facility] on 12/14/23 with RLE weakness. MRI demonstrated L frontal and L parietal lobe infarcts, CTA showed near-occlusive stenosis of L ACA. PMH of BPH, melanoma, CAD, HLD, HTN, DM II.  Clinical Impression  Pt admitted with above diagnosis. Independent with gait and ADLs PTA. Required supervision with rolling walker to steady himself while ambulating up to 150 feet today. Pt agreeable to use RW at d/c and feels more confident with this device. BERG indicates moderate fall risk, 47/56 suggesting use of AD in home. Pt verbalized understanding. Shows good awareness of deficits in RLE and with impaired gait/balance. Pt currently with functional limitations due to the deficits listed below (see PT Problem List). Pt will benefit from acute skilled PT to increase their independence and safety with mobility to allow discharge.           If plan is discharge home, recommend the following: A little help with walking and/or transfers;Help with stairs or ramp for entrance;Assistance with cooking/housework   Can travel by private vehicle        Equipment Recommendations None recommended by PT  Recommendations for Other Services       Functional Status Assessment Patient has had a recent decline in their functional status and demonstrates the ability to make significant improvements in function in a reasonable and predictable amount of time.     Precautions / Restrictions Precautions Precautions: Fall Recall of Precautions/Restrictions: Intact Restrictions Weight Bearing Restrictions Per Provider Order: No      Mobility  Bed Mobility               General bed mobility comments: in recliner    Transfers Overall transfer level: Needs assistance Equipment used: None Transfers: Sit to/from Stand Sit to  Stand: Supervision           General transfer comment: supervision for safety, able to rise with hands across chest, performed x2.    Ambulation/Gait Ambulation/Gait assistance: Supervision Gait Distance (Feet): 150 Feet Assistive device: Rolling walker (2 wheels), None Gait Pattern/deviations: Step-through pattern, Decreased stride length, Decreased stance time - right, Wide base of support, Drifts right/left Gait velocity: dec Gait velocity interpretation: 1.31 - 2.62 ft/sec, indicative of limited community ambulator   General Gait Details: Mild instability, CGA without assistive device, but with RW pt able to ambulate with better mechanics and feels more confident. Educated on use, safety, and awareness. Supervision with RW.  Stairs            Wheelchair Mobility     Tilt Bed    Modified Rankin (Stroke Patients Only) Modified Rankin (Stroke Patients Only) Pre-Morbid Rankin Score: No symptoms Modified Rankin: Moderately severe disability     Balance Overall balance assessment: Needs assistance Sitting-balance support: No upper extremity supported, Feet supported Sitting balance-Leahy Scale: Normal     Standing balance support: No upper extremity supported, During functional activity Standing balance-Leahy Scale: Good                   Standardized Balance Assessment Standardized Balance Assessment : Berg Balance Test Berg Balance Test Sit to Stand: Able to stand without using hands and stabilize independently Standing Unsupported: Able to stand safely 2 minutes Sitting with Back Unsupported but Feet Supported on Floor or Stool: Able to sit safely and securely 2 minutes Stand to Sit: Sits  safely with minimal use of hands Transfers: Able to transfer safely, minor use of hands Standing Unsupported with Eyes Closed: Able to stand 10 seconds safely Standing Ubsupported with Feet Together: Able to place feet together independently and stand 1 minute  safely From Standing, Reach Forward with Outstretched Arm: Can reach confidently >25 cm (10") From Standing Position, Pick up Object from Floor: Able to pick up shoe safely and easily From Standing Position, Turn to Look Behind Over each Shoulder: Looks behind from both sides and weight shifts well Turn 360 Degrees: Able to turn 360 degrees safely but slowly Standing Unsupported, Alternately Place Feet on Step/Stool: Able to complete >2 steps/needs minimal assist Standing Unsupported, One Foot in Front: Able to take small step independently and hold 30 seconds Standing on One Leg: Able to lift leg independently and hold equal to or more than 3 seconds Total Score: 47         Pertinent Vitals/Pain Pain Assessment Pain Assessment: No/denies pain    Home Living Family/patient expects to be discharged to:: Private residence Living Arrangements: Spouse/significant other Available Help at Discharge: Family;Available 24 hours/day Type of Home: House Home Access: Stairs to enter Entrance Stairs-Rails: Left Entrance Stairs-Number of Steps: 4   Home Layout: One level Home Equipment: Agricultural consultant (2 wheels);Shower seat;Grab bars - tub/shower;Grab bars - toilet;Cane - single point      Prior Function Prior Level of Function : Independent/Modified Independent;Driving             Mobility Comments: ind no device ADLs Comments: ind     Extremity/Trunk Assessment   Upper Extremity Assessment Upper Extremity Assessment: Defer to OT evaluation    Lower Extremity Assessment Lower Extremity Assessment: RLE deficits/detail RLE Deficits / Details: Grossly 4+/5 RLE except ankle dorsiflexion 4/5 and hallux extension 4-/5. RLE Sensation: WNL       Communication   Communication Communication: No apparent difficulties    Cognition Arousal: Alert Behavior During Therapy: WFL for tasks assessed/performed   PT - Cognitive impairments: No apparent impairments                          Following commands: Intact       Cueing Cueing Techniques: Verbal cues     General Comments      Exercises     Assessment/Plan    PT Assessment Patient needs continued PT services  PT Problem List Decreased strength;Decreased activity tolerance;Decreased balance;Decreased mobility;Decreased coordination       PT Treatment Interventions DME instruction;Gait training;Stair training;Functional mobility training;Therapeutic activities;Therapeutic exercise;Balance training;Neuromuscular re-education;Patient/family education    PT Goals (Current goals can be found in the Care Plan section)  Acute Rehab PT Goals Patient Stated Goal: Get well PT Goal Formulation: With patient Time For Goal Achievement: 12/28/23 Potential to Achieve Goals: Good    Frequency Min 3X/week     Co-evaluation               AM-PAC PT "6 Clicks" Mobility  Outcome Measure Help needed turning from your back to your side while in a flat bed without using bedrails?: None Help needed moving from lying on your back to sitting on the side of a flat bed without using bedrails?: None Help needed moving to and from a bed to a chair (including a wheelchair)?: A Little Help needed standing up from a chair using your arms (e.g., wheelchair or bedside chair)?: A Little Help needed to walk in hospital room?: A Little  Help needed climbing 3-5 steps with a railing? : A Little 6 Click Score: 20    End of Session Equipment Utilized During Treatment: Gait belt Activity Tolerance: Patient tolerated treatment well Patient left: in bed;with call bell/phone within reach [facility] in room taking BG)   PT Visit Diagnosis: Unsteadiness on feet (R26.81);Other abnormalities of gait and mobility (R26.89);Hemiplegia and hemiparesis Hemiplegia - Right/Left: Right Hemiplegia - dominant/non-dominant: Dominant Hemiplegia - caused by: Cerebral infarction    Time: 0802-0824 PT Time Calculation (min) (ACUTE ONLY):  22 min   Charges:   PT Evaluation $PT Eval Low Complexity: 1 Low   PT General Charges $$ ACUTE PT VISIT: 1 Visit         Jory Ng, PT, DPT [facility]  Rehabilitation Services Physical Therapist Office: [phone] Website: [email or web address]   Alinda Irani 12/15/2023, 10:24 AM

## 2023-12-15 NOTE — Progress Notes (Signed)
*  PRELIMINARY RESULTS* Echocardiogram 2D Echocardiogram has been performed.  Glenna Lango 12/15/2023, 11:44 AM

## 2023-12-15 NOTE — Progress Notes (Addendum)
 STROKE TEAM PROGRESS NOTE   INTERIM HISTORY/SUBJECTIVE Seen in room. Does have some mild right sided weakness still.  SLP at bedside   OBJECTIVE  CBC    Component Value Date/Time   WBC 6.2 12/14/2023 1022   RBC 5.38 12/14/2023 1022   HGB 15.3 12/14/2023 1022   HGB 10.8 (L) 11/21/2019 1037   HCT 48.1 12/14/2023 1022   HCT 35.0 (L) 11/21/2019 1037   PLT 344 12/14/2023 1022   PLT 387 11/21/2019 1037   MCV 89.4 12/14/2023 1022   MCV 74 (L) 11/21/2019 1037   MCH 28.4 12/14/2023 1022   MCHC 31.8 12/14/2023 1022   RDW 15.6 (H) 12/14/2023 1022   RDW 19.1 (H) 11/21/2019 1037   LYMPHSABS 0.9 12/22/2021 1437   MONOABS 0.5 12/22/2021 1437   EOSABS 0.1 12/22/2021 1437   BASOSABS 0.1 12/22/2021 1437    BMET    Component Value Date/Time   NA 142 12/15/2023 0249   NA 139 11/21/2019 1037   K 3.3 (L) 12/15/2023 0249   CL 106 12/15/2023 0249   CO2 24 12/15/2023 0249   GLUCOSE 103 (H) 12/15/2023 0249   BUN 24 (H) 12/15/2023 0249   BUN 21 11/21/2019 1037   CREATININE 1.10 12/15/2023 0249   CREATININE 1.16 11/08/2020 1352   CALCIUM  9.1 12/15/2023 0249   GFRNONAA >60 12/15/2023 0249   GFRNONAA >60 11/08/2020 1352    IMAGING past 24 hours MR Brain Wo Contrast (neuro protocol) Result Date: 12/14/2023 CLINICAL DATA:  Provided history: Neuro deficit, acute, stroke suspected. Additional history provided: Right leg weakness. Dizziness. EXAM: MRI HEAD WITHOUT CONTRAST TECHNIQUE: Multiplanar, multiecho pulse sequences of the brain and surrounding structures were obtained without intravenous contrast. COMPARISON:  Non-contrast head CT and CT angiogram head/neck 12/14/2023. Brain MRI 06/07/2020. FINDINGS: Brain: Mild generalized cerebral atrophy. Small patchy acute cortically-based infarcts within the posterior left frontal lobe and left parietal lobe (ACA vascular territory). Background mild multifocal T2 FLAIR hyperintense signal abnormality within the cerebral white matter, nonspecific but  compatible with chronic small vessel ischemic disease. These findings are similar to the prior MRI of 06/07/2020. Left parieto-occipital chronic microhemorrhage, new from the prior MRI. No evidence of an intracranial mass. No extra-axial fluid collection. No midline shift. Vascular: A severe, near-occlusive stenosis is noted within the left anterior cerebral artery A2 segment on the CTA performed earlier today. Please refer to this prior examination for additional vascular findings. Skull and upper cervical spine: No focal worrisome marrow lesion. Incompletely assessed cervical spondylosis. Sinuses/Orbits: No mass or acute finding within the imaged orbits. No significant paranasal sinus disease. Other: Small atlantooccipital joint effusion on the left. Impression #1 called by telephone at the time of interpretation on 12/14/2023 at 2:00 pm to provider [facility] , who verbally acknowledged these results. IMPRESSION: 1. Small patchy acute cortically-based infarcts within the posterior left frontal lobe and left parietal lobe (ACA vascular territory). A severe, near-occlusive stenosis is noted within the left anterior cerebral artery A2 segment on the CTA performed earlier today. 2. Background parenchymal atrophy and chronic small vessel ischemic disease, as described. Electronically Signed   By: Bascom Lily D.O.   On: 12/14/2023 14:03   CT ANGIO HEAD NECK W WO CM Result Date: 12/14/2023 CLINICAL DATA:  83 year old male neurologic deficit. Right lower extremity weakness, dizziness. EXAM: CT ANGIOGRAPHY HEAD AND NECK WITH AND WITHOUT CONTRAST TECHNIQUE: Multidetector CT imaging of the head and neck was performed using the standard protocol during bolus administration of intravenous contrast. Multiplanar CT  image reconstructions and MIPs were obtained to evaluate the vascular anatomy. Carotid stenosis measurements (when applicable) are obtained utilizing NASCET criteria, using the distal internal carotid  diameter as the denominator. RADIATION DOSE REDUCTION: This exam was performed according to the departmental dose-optimization program which includes automated exposure control, adjustment of the mA and/or kV according to patient size and/or use of iterative reconstruction technique. CONTRAST:  75mL OMNIPAQUE  IOHEXOL  350 MG/ML SOLN COMPARISON:  Head CT 02/12/2020.  Brain MRI 06/07/2020. FINDINGS: CT HEAD Brain: Cerebral volume remains normal for age. No midline shift, ventriculomegaly, mass effect, evidence of mass lesion, intracranial hemorrhage or evidence of cortically based acute infarction. Gray-white matter differentiation is within normal limits throughout the brain. Match that patchy bilateral cerebral white matter hypodensity appears moderate for age, not significantly changed from 2021. Calvarium and skull base: Chronic right TMJ degeneration. Calvarium intact. No acute osseous abnormality identified. Paranasal sinuses: Visualized paranasal sinuses and mastoids are clear. Orbits: Postoperative changes to the right globe. No gaze deviation. Visualized scalp soft tissues are within normal limits. CTA NECK Skeleton: Partially visible sternotomy. Cervical spine degeneration. No acute osseous abnormality identified. Upper chest: Evidence of previous CABG but there is a rounded intermediate density 4.4 cm mass partially visible in the anterior superior mediastinum, abutting the ventral proximal arch on series 7, image 169. No superior mediastinal mass effect is evident. No surrounding lymphadenopathy. Other neck: Nonvascular neck soft tissue spaces are within normal limits. Aortic arch: Calcified aortic atherosclerosis. CABG. Three vessel arch. Right carotid system: Tortuous brachiocephalic artery and right CCA origin with only mild plaque and no stenosis. Mild plaque at the right ICA origin and bulb, tortuosity, no stenosis. Left carotid system: Similar tortuosity. Minimal plaque. No stenosis. Vertebral  arteries: Proximal right subclavian artery and right vertebral artery origin are patent with no significant plaque or stenosis. Late entry of the right vertebral into the cervical transverse foramen, and appears non dominant on series 5, image 244. Right vertebral remains patent to the skull base with no extracranial stenosis. See intracranial findings below. Proximal left subclavian artery plaque without stenosis. Calcified plaque at the left vertebral artery origin without stenosis. Dominant left vertebral artery is tortuous and patent to the skull base with no significant stenosis. CTA HEAD Posterior circulation: Right vertebral V4 segment is occluded distal to the right PICA origin which remains faintly enhancing. This appears similar to the 2021 brain MRI flow void appearance. Dominant left vertebral artery supplies the basilar without stenosis. Left PICA origin is patent. Patent basilar artery without stenosis. Mild basilar artery irregularity. Patent SCA and PCA origins. Tortuous P1 segments. Bilateral PCA branches are patent with no significant stenosis. Anterior circulation: Patent ICA siphons with tortuosity. Mild siphon calcified plaque. No stenosis. Patent carotid termini. Left posterior communicating artery is present, normal. The right posterior communicating is diminutive or absent. Patent carotid termini, MCA and ACA origins. Dominant right A1. Tortuous A1 segments and fenestrated, ectatic anterior communicating artery. But no discrete ACA aneurysm. Bilateral ACA branches are within normal limits., bilateral ACA branches are patent. The right ACA remains dominant. There is mild left A3 stenosis on series 12, image 25. Left MCA M1 segment and bifurcation are tortuous, patent without stenosis. Right MCA M1 segment and bifurcation are patent and tortuous without stenosis. Bilateral MCA branches appear patent with no significant stenosis. Venous sinuses: Early contrast timing, not evaluated. Anatomic  variants: Dominant left vertebral artery which supplies the basilar. Dominant right ACA A1. Fetal type left PCA origin. Review of the  MIP images confirms the above findings IMPRESSION: 1. 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 Recommend follow-up CT Chest or CTA to further characterize. Aortic Atherosclerosis (ICD10-I70.0). 2. 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 3. No acute intracranial abnormality by CT. Moderate for age cerebral white matter changes, stable since 2021. Electronically Signed   By: Marlise Simpers M.D.   On: 12/14/2023 11:49    Vitals:   12/14/23 1945 12/14/23 2201 12/15/23 0333 12/15/23 0827  BP: (!) 176/91 (!) 153/89 130/80 (!) 164/99  Pulse:    98  Resp:  18 18   Temp:  98 F (36.7 C) 98 F (36.7 C) 97.7 F (36.5 C)  TempSrc:  Oral Oral Oral  SpO2:  96% 98% 100%  Weight:      Height:         PHYSICAL EXAM General:  Alert, well-nourished, well-developed patient in no acute distress Psych:  Mood and affect appropriate for situation CV: Regular rate and rhythm on monitor Respiratory:  Regular, unlabored respirations on room air GI: Abdomen soft and nontender   Neuro: Mental Status: Patient is awake, alert, oriented to person, place, month, year, and situation. Patient is able to give a clear and coherent history. No signs of dysarthria, aphasia or neglect Cranial Nerves: II: Visual Fields are full. "Fuzzy" vision in R eye at baseline.  Pupils are equal, round, and reactive to light.   III,IV, VI: EOMI without ptosis or diploplia.  V: Facial sensation is symmetric to light touch VII: Facial movement is symmetric.  VIII: hearing is intact to voice X: Uvula elevates  symmetrically XI: Shoulder shrug is symmetric. XII: tongue is midline without atrophy or fasciculations.  Motor: Tone is normal. Bulk is normal. 4+/5 RLE, no drift.  Sensory: Sensation is symmetric to light touch in the arms and legs. Cerebellar: FNF intact bilaterally. HKS slow on RLE.  Reduced rapid movements on R foot.   ASSESSMENT/PLAN  [name] is a 83 y.o. male with history of hypertension, hyperlipidemia, diabetes, BPH, CAD s/p CABG followed by brief postoperative A-fib (not on any anticoagulation )admitted for posterior left frontal lobe and left parietal lobe infarcts. CTA showed near-occlusive stenosis left ACA A2.   Acute Ischemic Infarct:  left frontal lobe  Etiology:  atherosclerosis versus cardioembolic    CTA head & neck  Positive for chronic appearing occlusion of the distal right vertebral artery V4 segment, distal to the right PICA which remains faintly enhancing MRI  Small patchy acute cortically-based infarcts within the posterior left frontal lobe and left parietal lobe (ACA vascular territory). A severe, near-occlusive stenosis is noted within the left anterior cerebral artery A2 segment on the CTA performed earlier today. 2D Echo EF 45-50% LDL 124 HgbA1c 7.6 Likely will need 30-day heart monitor or loop discharge VTE prophylaxis -Lovenox  aspirin  81 mg daily prior to admission, now on aspirin  81 mg daily and clopidogrel  75 mg daily for 3 months and then plavix  alone. Therapy recommendations: Pending pending Disposition: Outpatient Neuro Rehab  History of brief atrial fibrillation post CABG in 2021 Not on any anticoagulation EKG normal sinus rhythm continue telemonitoring  Hypertension CAD HFmr EF Home meds: Entresto  24-26 mg Stable Blood Pressure Goal: BP less than 220/110   Hyperlipidemia Home  meds: None LDL 124, goal < 70 Allergy to atorvastatin with myalgias.  Per chart review had decline lipid management in the past.  Will need to speak  to him about  possibly adding Crestor .  May need to consider Levqio Continue statin at discharge  Diabetes type II UnControlled Home meds: Jardiance  10 mg, metformin  HgbA1c 7.6, goal < 7.0 CBGs SSI Recommend close follow-up with PCP for better DM control  Dysphagia Patient has post-stroke dysphagia, SLP consulted    Diet   Diet heart healthy/carb modified Fluid consistency: Thin   Advance diet as tolerated  Other Stroke Risk Factors  Coronary artery disease  Congestive heart failure  Other Active Problems BPH GERD  Hospital day # 1  Patient seen and examined by NP/APP with MD. MD to update note as needed.   James Mana, DNP, FNP-BC [facility] Pager: [phone]  ATTENDING ATTESTATION:  Discussed with Dr. Hilton Braun. CT chest with ?pseudoaneurysm per CT surgery.James Braun MRI chest pending. Will follow results.   Dr. Dahlia Braun evaluated pt independently, reviewed imaging, chart, labs. Discussed and formulated plan with the Resident/APP. Changes were made to the note where appropriate. Please see APP/resident note above for details.   Total 36 minutes spent on counseling patient and coordinating care, writing notes and reviewing chart.   James Sally,MD    To contact Stroke Continuity provider, please refer to [email or web address]. After hours, contact General Neurology

## 2023-12-15 NOTE — Plan of Care (Signed)
  Problem: Education: Goal: Ability to describe self-care measures that may prevent or decrease complications (Diabetes Survival Skills Education) will improve Outcome: Progressing Goal: Individualized Educational Video(s) Outcome: Progressing   Problem: Coping: Goal: Ability to adjust to condition or change in health will improve Outcome: Progressing   Problem: Fluid Volume: Goal: Ability to maintain a balanced intake and output will improve Outcome: Progressing   Problem: Health Behavior/Discharge Planning: Goal: Ability to identify and utilize available resources and services will improve Outcome: Progressing Goal: Ability to manage health-related needs will improve Outcome: Progressing   Problem: Nutritional: Goal: Maintenance of adequate nutrition will improve Outcome: Progressing Goal: Progress toward achieving an optimal weight will improve Outcome: Progressing   Problem: Education: Goal: Knowledge of disease or condition will improve Outcome: Progressing Goal: Knowledge of secondary prevention will improve (MUST DOCUMENT ALL) Outcome: Progressing Goal: Knowledge of patient specific risk factors will improve (DELETE if not current risk factor) Outcome: Progressing   Problem: Ischemic Stroke/TIA Tissue Perfusion: Goal: Complications of ischemic stroke/TIA will be minimized Outcome: Progressing   Problem: Coping: Goal: Will verbalize positive feelings about self Outcome: Progressing Goal: Will identify appropriate support needs Outcome: Progressing

## 2023-12-15 NOTE — Evaluation (Signed)
 Occupational Therapy Evaluation Patient Details Name: James Braun MRN: [id number] DOB: [birth year] Today's Date: 12/15/2023   History of Present Illness   Pt is an 83 yo male presenting to [facility] on 12/14/23 with RLE weakness. MRI demonstrated L frontal and L parietal lobe infarcts, CTA showed near-occlusive stenosis of L ACA. PMH of BPH, melanoma, CAD, HLD, HTN, DM II.     Clinical Impressions Pt admitted for above, PTA pt reports being ind with ADLs/iADLs, has a hx of visual deficits at baseline. Pt currently demonstrating good functional use of BUEs, not deficits noted in them. Pt cognition and memory intact compared to his baseline, he is completing ADLs with CGA to setup A and ambulating with CGA no AD. His RLE seems slightly uncoordinated still. OT to continue following pt a low freq to continue to challenge balance during ADLs/iADLs. No post acute OT recommended.      If plan is discharge home, recommend the following:   Other (comment) (PRN)     Functional Status Assessment   Patient has had a recent decline in their functional status and/or demonstrates limited ability to make significant improvements in function in a reasonable and predictable amount of time     Equipment Recommendations   None recommended by OT (pt has rec DME)     Recommendations for Other Services         Precautions/Restrictions   Precautions Precautions: Fall Recall of Precautions/Restrictions: Intact Restrictions Weight Bearing Restrictions Per Provider Order: No     Mobility Bed Mobility Overal bed mobility: Modified Independent                  Transfers Overall transfer level: Needs assistance Equipment used: None Transfers: Sit to/from Stand Sit to Stand: Supervision           General transfer comment: no AD supervision for safety      Balance Overall balance assessment: Needs assistance Sitting-balance support: No upper extremity supported, Feet  supported Sitting balance-Leahy Scale: Normal     Standing balance support: No upper extremity supported, During functional activity Standing balance-Leahy Scale: Fair Standing balance comment: fair without AD, somewhat ataxic RLE.                           ADL either performed or assessed with clinical judgement   ADL Overall ADL's : Needs assistance/impaired Eating/Feeding: Independent;Sitting   Grooming: Standing;Contact guard assist Grooming Details (indicate cue type and reason): no AD Upper Body Bathing: Sitting;Set up   Lower Body Bathing: Sitting/lateral leans;Set up   Upper Body Dressing : Modified independent;Sitting   Lower Body Dressing: Sit to/from stand;Contact guard assist Lower Body Dressing Details (indicate cue type and reason): no AD Toilet Transfer: Contact guard assist;Ambulation   Toileting- Clothing Manipulation and Hygiene: Contact guard assist;Sit to/from stand       Functional mobility during ADLs: Contact guard assist       Vision Baseline Vision/History: 4 Cataracts Ability to See in Adequate Light: 1 Impaired Patient Visual Report: No change from baseline Vision Assessment?: No apparent visual deficits;Yes Eye Alignment: Within Functional Limits Ocular Range of Motion: Within Functional Limits Alignment/Gaze Preference: Within Defined Limits Tracking/Visual Pursuits: Able to track stimulus in all quads without difficulty Saccades: Within functional limits Convergence: Within functional limits Visual Fields: No apparent deficits Additional Comments: pt with failed catarcts sx in R eye and beginning cataracts of L eye, has some fuzzy vision in R eye. No deficits  from baseline per his report. His opthomalogist has given him magnifying glasses and he reports that he does not drive at night.     Perception Perception: Within Functional Limits       Praxis Praxis: WFL       Pertinent Vitals/Pain Pain Assessment Pain  Assessment: No/denies pain     Extremity/Trunk Assessment Upper Extremity Assessment Upper Extremity Assessment: Overall WFL for tasks assessed;Right hand dominant;RUE deficits/detail RUE Deficits / Details: missing DIP of R middle finger. ROM, coordination, strength, sensation intact. RUE Sensation: WNL RUE Coordination: WNL   Lower Extremity Assessment RLE Deficits / Details: Grossly 4+/5 RLE except ankle dorsiflexion 4/5 and hallux extension 4-/5. RLE Sensation: WNL       Communication Communication Communication: No apparent difficulties   Cognition Arousal: Alert Behavior During Therapy: WFL for tasks assessed/performed Cognition: No apparent impairments             OT - Cognition Comments: Pt STM intact, 3/3 word recall. Performing simple math and clock draw without challenge.                 Following commands: Intact       Cueing  General Comments   Cueing Techniques: Verbal cues      Exercises     Shoulder Instructions      Home Living Family/patient expects to be discharged to:: Private residence Living Arrangements: Spouse/significant other Available Help at Discharge: Family;Available 24 hours/day Type of Home: House Home Access: Stairs to enter [employer] of Steps: 4 Entrance Stairs-Rails: Left Home Layout: One level     Bathroom Shower/Tub: Producer, television/film/video: Standard     Home Equipment: Agricultural consultant (2 wheels);Shower seat;Grab bars - tub/shower;Grab bars - toilet;Cane - single point          Prior Functioning/Environment Prior Level of Function : Independent/Modified Independent;Driving             Mobility Comments: ind no device ADLs Comments: ind    OT Problem List: Impaired balance (sitting and/or standing)   OT Treatment/Interventions: Self-care/ADL training;Patient/family education;Therapeutic exercise;Balance training;Therapeutic activities      OT Goals(Current goals can be  found in the care plan section)   Acute Rehab OT Goals Patient Stated Goal: To go home OT Goal Formulation: With patient/family Time For Goal Achievement: 12/29/23 Potential to Achieve Goals: Good ADL Goals Pt Will Perform Grooming: with modified independence;standing Pt Will Perform Lower Body Bathing: with modified independence;sit to/from stand Pt Will Perform Lower Body Dressing: with modified independence;sit to/from stand Pt Will Perform Tub/Shower Transfer: Shower transfer;ambulating;with modified independence   OT Frequency:  Min 1X/week    Co-evaluation              AM-PAC OT "6 Clicks" Daily Activity     Outcome Measure Help from another person eating meals?: None Help from another person taking care of personal grooming?: A Little Help from another person toileting, which includes using toliet, bedpan, or urinal?: A Little Help from another person bathing (including washing, rinsing, drying)?: A Little Help from another person to put on and taking off regular upper body clothing?: None Help from another person to put on and taking off regular lower body clothing?: A Little 6 Click Score: 20   End of Session Nurse Communication: Mobility status  Activity Tolerance: Patient tolerated treatment well Patient left: in bed;with call bell/phone within reach;with family/visitor present  OT Visit Diagnosis: Unsteadiness on feet (R26.81);Other abnormalities of gait and mobility (  R26.89)                Time: 4098-1191 OT Time Calculation (min): 22 min Charges:  OT General Charges $OT Visit: 1 Visit OT Evaluation $OT Eval Low Complexity: 1 Low  12/15/2023  AB, OTR/L  Acute Rehabilitation Services  Office: [phone]   Jorene New 12/15/2023, 1:03 PM

## 2023-12-15 NOTE — Consult Note (Signed)
 Reason for Consult:anterior mediastinal mass Referring Physician: Dr. Hilton Lucky, [facility]  James Braun is an 83 y.o. male.  HPI: 83 yo man with a past history of hypertension, hyperlipidemia, type 2 diabetes, CAD, CABG, ischemic cardiomyopathy, melanoma, and paroxysmal atrial fibrillation. Presented with right leg weakness.  R/i for stroke.  Has had recovery of function.  As part of workup had CT angio of neck which showed part of an anterior mediastinal mass.  A Ct chest showed a 5 cm anterior mediastinal mass with peripheral enhancement.  Denies chest pain, pressure or tightness and shortness of breath.  Has had chronic lightheadedness since CABG in 2021.  Past Medical History:  Diagnosis Date   BPH (benign prostatic hypertrophy)    Cancer (HCC)    melanoma   Coronary artery disease    History of kidney stones    History of melanoma excision OCT 2010   Hyperlipidemia    Hypertension    Left ureteral calculus    Nephrolithiasis    bilateral   Type 2 diabetes mellitus (HCC)     Past Surgical History:  Procedure Laterality Date   CARDIAC CATHETERIZATION     CHOLECYSTECTOMY  01/1999   CORNEAL TRANSPLANT Right 2023   CORONARY ARTERY BYPASS GRAFT N/A 11/28/2019   Procedure: CORONARY ARTERY BYPASS GRAFTING (CABG) x3, using left internal mammary artery and Saphenous vein harvested endoscopically;  Surgeon: Heriberto London, MD;  Location: [facility] OR;  Service: Open Heart Surgery;  Laterality: N/A;   CYSTOSCOPY WITH STENT PLACEMENT Left 11/14/2012   Procedure: CYSTOSCOPY WITH STENT PLACEMENT;  Surgeon: Trent Frizzle, MD;  Location: [facility];  Service: Urology;  Laterality: Left;   CYSTOSCOPY/RETROGRADE/URETEROSCOPY/STONE EXTRACTION WITH BASKET Left 11/14/2012   Procedure: CYSTOSCOPY/RETROGRADE/URETEROSCOPY/STONE EXTRACTION WITH BASKET;  Surgeon: Trent Frizzle, MD;  Location: [facility];  Service: Urology;  Laterality: Left;   EXTRACORPOREAL SHOCK WAVE  LITHOTRIPSY  01/1999   X2   HOLMIUM LASER APPLICATION Left 11/14/2012   Procedure: HOLMIUM LASER APPLICATION;  Surgeon: Trent Frizzle, MD;  Location: [facility];  Service: Urology;  Laterality: Left;   LEFT HEART CATH AND CORONARY ANGIOGRAPHY N/A 11/27/2019   Procedure: LEFT HEART CATH AND CORONARY ANGIOGRAPHY;  Surgeon: Avanell Leigh, MD;  Location: [facility] IN[facility];  Service: Cardiovascular;  Laterality: N/A;   LEFT URETEROSCOPIC STONE EXTRACTION  02/22/2007   NEPHROLITHOTOMY Right 09/28/2010   percutaneous   ORIF CLAVICLE FRACTURE  2006   PROSTATE BIOPSY  1998   neg   SHOULDER ARTHROSCOPY  07/2013   Dr Genevive Ket   TEE WITHOUT CARDIOVERSION N/A 11/28/2019   Procedure: TRANSESOPHAGEAL ECHOCARDIOGRAM (TEE);  Surgeon: Matt Song, Donata Fryer, MD;  Location: [facility] OR;  Service: Open Heart Surgery;  Laterality: N/A;   TONSILLECTOMY      Family History  Problem Relation Age of Onset   Hypertension Mother    Diabetes Father    Hypertension Father    Colon polyps Brother    Coronary artery disease Neg Hx    Cancer Neg Hx    Esophageal cancer Neg Hx    Stomach cancer Neg Hx    Rectal cancer Neg Hx     Social History:  reports that he has never smoked. He has never been exposed to tobacco smoke. He has never used smokeless tobacco. He reports that he does not drink alcohol and does not use drugs.  Allergies:  Allergies  Allergen Reactions   Lipitor [Atorvastatin] Other (See Comments)    myalgias  Lisinopril    Morphine And Codeine Other (See Comments)     hypotension   Levofloxacin Rash   Septra [Sulfamethoxazole-Trimethoprim] Rash   Sulfamethoxazole Rash   Sulfamethoxazole-Trimethoprim Rash    Medications: Scheduled:  aspirin  EC  81 mg Oral Daily   vitamin D3  1,000 Units Oral BID   clopidogrel   75 mg Oral Daily   [START ON 12/16/2023] empagliflozin   10 mg Oral QAC breakfast   enoxaparin  (LOVENOX ) injection  40 mg Subcutaneous Q24H   ezetimibe  10 mg Oral  Daily   finasteride   5 mg Oral Daily   insulin  aspart  0-9 Units Subcutaneous TID WC   multivitamin with minerals  1 tablet Oral Daily   pantoprazole   40 mg Oral Daily   sodium chloride  flush  3 mL Intravenous Q12H   vitamin B-12  1,000 mcg Oral Daily    Results for orders placed or performed during the hospital encounter of 12/14/23 (from the past 48 hours)  CBG monitoring, ED     Status: Abnormal   Collection Time: 12/14/23  9:36 AM  Result Value Ref Range   Glucose-Capillary 267 (H) 70 - 99 mg/dL    Comment: Glucose reference range applies only to samples taken after fasting for at least 8 hours.  Comprehensive metabolic panel     Status: Abnormal   Collection Time: 12/14/23 10:22 AM  Result Value Ref Range   Sodium 139 135 - 145 mmol/L   Potassium 3.9 3.5 - 5.1 mmol/L   Chloride 101 98 - 111 mmol/L   CO2 27 22 - 32 mmol/L   Glucose, Bld 228 (H) 70 - 99 mg/dL    Comment: Glucose reference range applies only to samples taken after fasting for at least 8 hours.   BUN 23 8 - 23 mg/dL   Creatinine, Ser 6.29 (H) 0.61 - 1.24 mg/dL   Calcium  9.4 8.9 - 10.3 mg/dL   Total Protein 6.8 6.5 - 8.1 g/dL   Albumin  3.7 3.5 - 5.0 g/dL   AST 14 (L) 15 - 41 U/L   ALT 14 0 - 44 U/L   Alkaline Phosphatase 45 38 - 126 U/L   Total Bilirubin 0.7 0.0 - 1.2 mg/dL   GFR, Estimated 57 (L) >60 mL/min    Comment: (NOTE) Calculated using the CKD-EPI Creatinine Equation (2021)    Anion gap 11 5 - 15    Comment: Performed at [facility], [address], [place], [place] [address]  CBC     Status: Abnormal   Collection Time: 12/14/23 10:22 AM  Result Value Ref Range   WBC 6.2 4.0 - 10.5 K/uL   RBC 5.38 4.22 - 5.81 MIL/uL   Hemoglobin 15.3 13.0 - 17.0 g/dL   HCT 32.4 40.1 - 02.7 %   MCV 89.4 80.0 - 100.0 fL   MCH 28.4 26.0 - 34.0 pg   MCHC 31.8 30.0 - 36.0 g/dL   RDW 25.3 (H) 66.4 - 40.3 %   Platelets 344 150 - 400 K/uL   nRBC 0.0 0.0 - 0.2 %    Comment: Performed at [facility]  Lab, [address] [place], [place] [address]  CBG monitoring, ED     Status: Abnormal   Collection Time: 12/14/23  5:17 PM  Result Value Ref Range   Glucose-Capillary 139 (H) 70 - 99 mg/dL    Comment: Glucose reference range applies only to samples taken after fasting for at least 8 hours.  Lipid panel  Status: Abnormal   Collection Time: 12/15/23  2:49 AM  Result Value Ref Range   Cholesterol 200 0 - 200 mg/dL   Triglycerides 161 (H) <150 mg/dL   HDL 33 (L) >09 mg/dL   Total CHOL/HDL Ratio 6.1 RATIO   VLDL 43 (H) 0 - 40 mg/dL   LDL Cholesterol 604 (H) 0 - 99 mg/dL    Comment:        Total Cholesterol/HDL:CHD Risk Coronary Heart Disease Risk Table                     Men   Women  1/2 Average Risk   3.4   3.3  Average Risk       5.0   4.4  2 X Average Risk   9.6   7.1  3 X Average Risk  23.4   11.0        Use the calculated Patient Ratio above and the CHD Risk Table to determine the patient's CHD Risk.        ATP III CLASSIFICATION (LDL):  <100     mg/dL   Optimal  540-981  mg/dL   Near or Above                    Optimal  130-159  mg/dL   Borderline  191-478  mg/dL   High  >295     mg/dL   Very High Performed at [facility], [address], [place], [place] [address]   Hemoglobin A1c     Status: Abnormal   Collection Time: 12/15/23  2:49 AM  Result Value Ref Range   Hgb A1c MFr Bld 7.6 (H) 4.8 - 5.6 %    Comment: (NOTE) Diagnosis of Diabetes The following HbA1c ranges recommended by the American Diabetes Association (ADA) may be used as an aid in the diagnosis of diabetes mellitus.  Hemoglobin             Suggested A1C NGSP%              Diagnosis  <5.7                   Non Diabetic  5.7-6.4                Pre-Diabetic  >6.4                   Diabetic  <7.0                   Glycemic control for                       adults with diabetes.     Mean Plasma Glucose 171.42 mg/dL    Comment: Performed at [facility], [address]  [place], [place] [address]  Basic metabolic panel with GFR     Status: Abnormal   Collection Time: 12/15/23  2:49 AM  Result Value Ref Range   Sodium 142 135 - 145 mmol/L   Potassium 3.3 (L) 3.5 - 5.1 mmol/L   Chloride 106 98 - 111 mmol/L   CO2 24 22 - 32 mmol/L   Glucose, Bld 103 (H) 70 - 99 mg/dL    Comment: Glucose reference range applies only to samples taken after fasting for at least 8 hours.   BUN 24 (H) 8 - 23 mg/dL   Creatinine, Ser 4.69 0.61 - 1.24 mg/dL  Calcium  9.1 8.9 - 10.3 mg/dL   GFR, Estimated >16 >10 mL/min    Comment: (NOTE) Calculated using the CKD-EPI Creatinine Equation (2021)    Anion gap 12 5 - 15    Comment: Performed at [facility], [address], [place], [place] [address]  Vitamin B12     Status: Abnormal   Collection Time: 12/15/23  2:49 AM  Result Value Ref Range   Vitamin B-12 2,318 (H) 180 - 914 pg/mL    Comment: (NOTE) This assay is not validated for testing neonatal or myeloproliferative syndrome specimens for Vitamin B12 levels. Performed at [facility], [address], [place], [place] [address]   Glucose, capillary     Status: Abnormal   Collection Time: 12/15/23  8:25 AM  Result Value Ref Range   Glucose-Capillary 146 (H) 70 - 99 mg/dL    Comment: Glucose reference range applies only to samples taken after fasting for at least 8 hours.  Glucose, capillary     Status: None   Collection Time: 12/15/23 12:05 PM  Result Value Ref Range   Glucose-Capillary 94 70 - 99 mg/dL    Comment: Glucose reference range applies only to samples taken after fasting for at least 8 hours.  Glucose, capillary     Status: Abnormal   Collection Time: 12/15/23  3:56 PM  Result Value Ref Range   Glucose-Capillary 214 (H) 70 - 99 mg/dL    Comment: Glucose reference range applies only to samples taken after fasting for at least 8 hours.    ECHOCARDIOGRAM COMPLETE Result Date: 12/15/2023    ECHOCARDIOGRAM REPORT   Patient Name:   James Braun Date  of Exam: 12/15/2023 Medical Rec #:  [id number]      Height:       68.0 in Accession #:    [id number]     Weight:       140.0 lb Date of Birth:  1941-02-09      BSA:          1.756 m Patient Age:    82 years       BP:           164/99 mmHg Patient Gender: M              HR:           83 bpm. Exam Location:  Inpatient Procedure: 2D Echo, Cardiac Doppler and Color Doppler (Both Spectral and Color            Flow Doppler were utilized during procedure). Indications:    Stroke I63.9  History:        Patient has prior history of Echocardiogram examinations, most                 recent 05/19/2020. Risk Factors:Hypertension.  Sonographer:    Jeralene Mom Referring Phys: [id number] Tylene Galla M GHIMIRE IMPRESSIONS  1. Left ventricular ejection fraction, by estimation, is 45 to 50%. The left ventricle has mildly decreased function. The left ventricle demonstrates global hypokinesis. Left ventricular diastolic parameters are consistent with Grade I diastolic dysfunction (impaired relaxation). Elevated left atrial pressure.  2. Right ventricular systolic function is normal. The right ventricular size is normal. There is normal pulmonary artery systolic pressure.  3. The mitral valve is normal in structure. No evidence of mitral valve regurgitation. No evidence of mitral stenosis.  4. The tricuspid valve is abnormal.  5. The aortic valve is tricuspid. There is moderate calcification of  the aortic valve. There is moderate thickening of the aortic valve. Aortic valve regurgitation is mild. No aortic stenosis is present.  6. The inferior vena cava is normal in size with greater than 50% respiratory variability, suggesting right atrial pressure of 3 mmHg. FINDINGS  Left Ventricle: Left ventricular ejection fraction, by estimation, is 45 to 50%. The left ventricle has mildly decreased function. The left ventricle demonstrates global hypokinesis. The left ventricular internal cavity size was normal in size. There is  no left ventricular  hypertrophy. Left ventricular diastolic parameters are consistent with Grade I diastolic dysfunction (impaired relaxation). Elevated left atrial pressure. Right Ventricle: The right ventricular size is normal. Right vetricular wall thickness was not well visualized. Right ventricular systolic function is normal. There is normal pulmonary artery systolic pressure. The tricuspid regurgitant velocity is 2.67 m/s, and with an assumed right atrial pressure of 3 mmHg, the estimated right ventricular systolic pressure is 31.5 mmHg. Left Atrium: Left atrial size was normal in size. Right Atrium: Right atrial size was normal in size. Pericardium: There is no evidence of pericardial effusion. Mitral Valve: The mitral valve is normal in structure. There is mild thickening of the mitral valve leaflet(s). There is mild calcification of the mitral valve leaflet(s). Mild mitral annular calcification. No evidence of mitral valve regurgitation. No evidence of mitral valve stenosis. MV peak gradient, 7.5 mmHg. The mean mitral valve gradient is 2.0 mmHg. Tricuspid Valve: The tricuspid valve is abnormal. Tricuspid valve regurgitation is mild . No evidence of tricuspid stenosis. Aortic Valve: The aortic valve is tricuspid. There is moderate calcification of the aortic valve. There is moderate thickening of the aortic valve. There is moderate aortic valve annular calcification. Aortic valve regurgitation is mild. Aortic regurgitation PHT measures 413 msec. No aortic stenosis is present. Aortic valve mean gradient measures 6.5 mmHg. Aortic valve peak gradient measures 12.6 mmHg. Aortic valve area, by VTI measures 1.53 cm. Pulmonic Valve: The pulmonic valve was not well visualized. Pulmonic valve regurgitation is mild. No evidence of pulmonic stenosis. Aorta: The aortic root and ascending aorta are structurally normal, with no evidence of dilitation. Venous: The inferior vena cava is normal in size with greater than 50% respiratory  variability, suggesting right atrial pressure of 3 mmHg. IAS/Shunts: No atrial level shunt detected by color flow Doppler.  LEFT VENTRICLE PLAX 2D LVIDd:         4.20 cm      Diastology LVIDs:         3.30 cm      LV e' medial:    4.46 cm/s LV PW:         0.95 cm      LV E/e' medial:  19.7 LV IVS:        0.95 cm      LV e' lateral:   8.16 cm/s LVOT diam:     2.10 cm      LV E/e' lateral: 10.8 LV SV:         55 LV SV Index:   31 LVOT Area:     3.46 cm  LV Volumes (MOD) LV vol d, MOD A2C: 101.0 ml LV vol d, MOD A4C: 82.7 ml LV vol s, MOD A2C: 60.0 ml LV vol s, MOD A4C: 60.6 ml LV SV MOD A2C:     41.0 ml LV SV MOD A4C:     82.7 ml LV SV MOD BP:      31.0 ml RIGHT VENTRICLE RV Basal diam:  3.60 cm  RV Mid diam:    3.10 cm RV S prime:     9.79 cm/s TAPSE (M-mode): 1.6 cm LEFT ATRIUM             Index        RIGHT ATRIUM           Index LA diam:        2.30 cm 1.31 cm/m   RA Area:     12.40 cm LA Vol (A2C):   44.8 ml 25.51 ml/m  RA Volume:   25.50 ml  14.52 ml/m LA Vol (A4C):   40.4 ml 23.00 ml/m LA Biplane Vol: 43.5 ml 24.77 ml/m  AORTIC VALVE                     PULMONIC VALVE AV Area (Vmax):    1.70 cm      PR End Diast Vel: 7.84 msec AV Area (Vmean):   1.75 cm AV Area (VTI):     1.53 cm AV Vmax:           177.50 cm/s AV Vmean:          117.000 cm/s AV VTI:            0.361 m AV Peak Grad:      12.6 mmHg AV Mean Grad:      6.5 mmHg LVOT Vmax:         87.30 cm/s LVOT Vmean:        59.000 cm/s LVOT VTI:          0.159 m LVOT/AV VTI ratio: 0.44 AI PHT:            413 msec  AORTA Ao Root diam: 3.00 cm Ao Asc diam:  3.20 cm MITRAL VALVE                TRICUSPID VALVE MV Area (PHT): 3.53 cm     TR Peak grad:   28.5 mmHg MV Peak grad:  7.5 mmHg     TR Vmax:        267.00 cm/s MV Mean grad:  2.0 mmHg MV Vmax:       1.37 m/s     SHUNTS MV Vmean:      61.1 cm/s    Systemic VTI:  0.16 m MV Decel Time: 215 msec     Systemic Diam: 2.10 cm MV E velocity: 87.80 cm/s MV A velocity: 136.00 cm/s MV E/A ratio:  0.65 Armida Lander MD Electronically signed by Armida Lander MD Signature Date/Time: 12/15/2023/3:24:52 PM    Final    CT CHEST W CONTRAST Result Date: 12/15/2023 CLINICAL DATA:  Mediastinal mass on neck CTA EXAM: CT CHEST WITH CONTRAST TECHNIQUE: Multidetector CT imaging of the chest was performed during intravenous contrast administration. RADIATION DOSE REDUCTION: This exam was performed according to the departmental dose-optimization program which includes automated exposure control, adjustment of the mA and/or kV according to patient size and/or use of iterative reconstruction technique. CONTRAST:  50mL OMNIPAQUE  IOHEXOL  350 MG/ML SOLN COMPARISON:  Neck CTA 12/14/2023 FINDINGS: Cardiovascular: The heart size is normal. No substantial pericardial effusion. Coronary artery calcification is evident. Moderate atherosclerotic calcification is noted in the wall of the thoracic aorta. Status post CABG. Mediastinum/Nodes: 5.9 x 4.2 x 5.1 cm low-density lesion is identified anterior mediastinum with relatively thick ill-defined circumferential rim enhancement. This lesion is contiguous with the ascending thoracic aorta at the level of the coronary grafts and along the right lateral aspect  of the pulmonary trunk. There is some extension of the lesion into the space between the pulmonary trunk in the ascending thoracic aorta (image 55/3). No mediastinal lymphadenopathy. There is no hilar lymphadenopathy. Large hiatal hernia. The esophagus has normal imaging features. There is no axillary lymphadenopathy. Lungs/Pleura: No suspicious pulmonary nodule or mass. Calcified granuloma noted left upper lobe. No focal airspace consolidation. There is no evidence of pleural effusion. Upper Abdomen: Visualized portion of the upper abdomen shows no acute findings. Musculoskeletal: No worrisome lytic or sclerotic osseous abnormality. IMPRESSION: 1. 5.9 x 4.2 x 5.1 cm low-density lesion in the anterior mediastinum with relatively thick  ill-defined circumferential rim enhancement. This lesion is contiguous with the ascending thoracic aorta at the level of the coronary grafts and along the right lateral aspect of the pulmonary trunk. Thymoma/thymic carcinoma is a concern. Given proximity to the coronary grafts, thrombosed pseudoaneurysm also a consideration. Chronic hematoma or infected pericardial cyst not excluded. Lymphoma also a consideration. Cardiac echo may prove helpful to further evaluate. MRI chest with and without contrast would also be a possible follow-up imaging modality. 2. Large hiatal hernia. 3.  Aortic Atherosclerosis (ICD10-I70.0). Electronically Signed   By: Donnal Fusi M.D.   On: 12/15/2023 13:52   MR Brain Wo Contrast (neuro protocol) Result Date: 12/14/2023 CLINICAL DATA:  Provided history: Neuro deficit, acute, stroke suspected. Additional history provided: Right leg weakness. Dizziness. EXAM: MRI HEAD WITHOUT CONTRAST TECHNIQUE: Multiplanar, multiecho pulse sequences of the brain and surrounding structures were obtained without intravenous contrast. COMPARISON:  Non-contrast head CT and CT angiogram head/neck 12/14/2023. Brain MRI 06/07/2020. FINDINGS: Brain: Mild generalized cerebral atrophy. Small patchy acute cortically-based infarcts within the posterior left frontal lobe and left parietal lobe (ACA vascular territory). Background mild multifocal T2 FLAIR hyperintense signal abnormality within the cerebral white matter, nonspecific but compatible with chronic small vessel ischemic disease. These findings are similar to the prior MRI of 06/07/2020. Left parieto-occipital chronic microhemorrhage, new from the prior MRI. No evidence of an intracranial mass. No extra-axial fluid collection. No midline shift. Vascular: A severe, near-occlusive stenosis is noted within the left anterior cerebral artery A2 segment on the CTA performed earlier today. Please refer to this prior examination for additional vascular findings.  Skull and upper cervical spine: No focal worrisome marrow lesion. Incompletely assessed cervical spondylosis. Sinuses/Orbits: No mass or acute finding within the imaged orbits. No significant paranasal sinus disease. Other: Small atlantooccipital joint effusion on the left. Impression #1 called by telephone at the time of interpretation on 12/14/2023 at 2:00 pm to provider [facility] , who verbally acknowledged these results. IMPRESSION: 1. Small patchy acute cortically-based infarcts within the posterior left frontal lobe and left parietal lobe (ACA vascular territory). A severe, near-occlusive stenosis is noted within the left anterior cerebral artery A2 segment on the CTA performed earlier today. 2. Background parenchymal atrophy and chronic small vessel ischemic disease, as described. Electronically Signed   By: Bascom Lily D.O.   On: 12/14/2023 14:03   CT ANGIO HEAD NECK W WO CM Result Date: 12/14/2023 CLINICAL DATA:  83 year old male neurologic deficit. Right lower extremity weakness, dizziness. EXAM: CT ANGIOGRAPHY HEAD AND NECK WITH AND WITHOUT CONTRAST TECHNIQUE: Multidetector CT imaging of the head and neck was performed using the standard protocol during bolus administration of intravenous contrast. Multiplanar CT image reconstructions and MIPs were obtained to evaluate the vascular anatomy. Carotid stenosis measurements (when applicable) are obtained utilizing NASCET criteria, using the distal internal carotid diameter as the denominator. RADIATION  DOSE REDUCTION: This exam was performed according to the departmental dose-optimization program which includes automated exposure control, adjustment of the mA and/or kV according to patient size and/or use of iterative reconstruction technique. CONTRAST:  75mL OMNIPAQUE  IOHEXOL  350 MG/ML SOLN COMPARISON:  Head CT 02/12/2020.  Brain MRI 06/07/2020. FINDINGS: CT HEAD Brain: Cerebral volume remains normal for age. No midline shift, ventriculomegaly,  mass effect, evidence of mass lesion, intracranial hemorrhage or evidence of cortically based acute infarction. Gray-white matter differentiation is within normal limits throughout the brain. Match that patchy bilateral cerebral white matter hypodensity appears moderate for age, not significantly changed from 2021. Calvarium and skull base: Chronic right TMJ degeneration. Calvarium intact. No acute osseous abnormality identified. Paranasal sinuses: Visualized paranasal sinuses and mastoids are clear. Orbits: Postoperative changes to the right globe. No gaze deviation. Visualized scalp soft tissues are within normal limits. CTA NECK Skeleton: Partially visible sternotomy. Cervical spine degeneration. No acute osseous abnormality identified. Upper chest: Evidence of previous CABG but there is a rounded intermediate density 4.4 cm mass partially visible in the anterior superior mediastinum, abutting the ventral proximal arch on series 7, image 169. No superior mediastinal mass effect is evident. No surrounding lymphadenopathy. Other neck: Nonvascular neck soft tissue spaces are within normal limits. Aortic arch: Calcified aortic atherosclerosis. CABG. Three vessel arch. Right carotid system: Tortuous brachiocephalic artery and right CCA origin with only mild plaque and no stenosis. Mild plaque at the right ICA origin and bulb, tortuosity, no stenosis. Left carotid system: Similar tortuosity. Minimal plaque. No stenosis. Vertebral arteries: Proximal right subclavian artery and right vertebral artery origin are patent with no significant plaque or stenosis. Late entry of the right vertebral into the cervical transverse foramen, and appears non dominant on series 5, image 244. Right vertebral remains patent to the skull base with no extracranial stenosis. See intracranial findings below. Proximal left subclavian artery plaque without stenosis. Calcified plaque at the left vertebral artery origin without stenosis. Dominant  left vertebral artery is tortuous and patent to the skull base with no significant stenosis. CTA HEAD Posterior circulation: Right vertebral V4 segment is occluded distal to the right PICA origin which remains faintly enhancing. This appears similar to the 2021 brain MRI flow void appearance. Dominant left vertebral artery supplies the basilar without stenosis. Left PICA origin is patent. Patent basilar artery without stenosis. Mild basilar artery irregularity. Patent SCA and PCA origins. Tortuous P1 segments. Bilateral PCA branches are patent with no significant stenosis. Anterior circulation: Patent ICA siphons with tortuosity. Mild siphon calcified plaque. No stenosis. Patent carotid termini. Left posterior communicating artery is present, normal. The right posterior communicating is diminutive or absent. Patent carotid termini, MCA and ACA origins. Dominant right A1. Tortuous A1 segments and fenestrated, ectatic anterior communicating artery. But no discrete ACA aneurysm. Bilateral ACA branches are within normal limits., bilateral ACA branches are patent. The right ACA remains dominant. There is mild left A3 stenosis on series 12, image 25. Left MCA M1 segment and bifurcation are tortuous, patent without stenosis. Right MCA M1 segment and bifurcation are patent and tortuous without stenosis. Bilateral MCA branches appear patent with no significant stenosis. Venous sinuses: Early contrast timing, not evaluated. Anatomic variants: Dominant left vertebral artery which supplies the basilar. Dominant right ACA A1. Fetal type left PCA origin. Review of the MIP images confirms the above findings IMPRESSION: 1. 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 Recommend follow-up CT Chest  or CTA to further characterize. Aortic Atherosclerosis (ICD10-I70.0). 2. 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 3. No acute intracranial abnormality by CT. Moderate for age cerebral white matter changes, stable since 2021. Electronically Signed   By: Marlise Simpers M.D.   On: 12/14/2023 11:49   I personally reviewed the Ct and echo images.   CT shows 6 x 4 x 5 cm  hypodense mass with peripheral enhancing rim.   Echo shows EF 45%.  Cannot visualize area of mass.  Review of Systems  Constitutional:  Negative for activity change, appetite change and unexpected weight change.  HENT:  Negative for trouble swallowing and voice change.   Respiratory:  Negative for shortness of breath.   Cardiovascular:  Negative for chest pain.  Neurological:  Positive for weakness and light-headedness.  Hematological:  Bruises/bleeds easily.   Blood pressure (!) 170/100, pulse 89, temperature 97.6 F (36.4 C), temperature source Oral, resp. rate 18, height 5\' 8"  (1.727 m), weight 63.5 kg, SpO2 100%. Physical Exam Vitals reviewed.  Constitutional:      General: He is not in acute distress.    Appearance: Normal appearance.  HENT:     Head: Normocephalic and atraumatic.  Eyes:     Extraocular Movements: Extraocular movements intact.     Pupils: Pupils are equal, round, and reactive to light.  Neck:     Vascular: No carotid bruit.  Cardiovascular:     Rate and Rhythm: Normal rate and regular rhythm.     Heart sounds: Normal heart sounds. No murmur heard. Pulmonary:     Effort: Pulmonary effort is normal. No respiratory distress.     Breath sounds: Normal breath sounds. No wheezing.  Abdominal:     Palpations: Abdomen is soft.  Lymphadenopathy:     Cervical: No cervical adenopathy.  Skin:    General: Skin is warm and dry.  Neurological:     General: No focal deficit present.     Mental Status: He is alert and oriented to person, place, and time.     Cranial  Nerves: No cranial nerve deficit.     Assessment/Plan: Mr. Lennartz is an 83 year-old man with with a past history of hypertension, hyperlipidemia, type 2 diabetes, CAD, CABG, ischemic cardiomyopathy, melanoma, and paroxysmal atrial fibrillation.  Presented with right leg weakness and diagnosed with a stroke. Incidentally noted to have an anterior mediastinal mass.  Anterior mediastinal mass- differential includes thymoma and thymic carcinoma and lymphoma.  Radiology raised issue of possible thrombosed pseudoaneurysm, which complicates matters significantly.  Need to r/o that possibility before any consideration can be given to possible biopsy.  To have MR with and without contrast to further evaluate mass.  Will follow  Zelphia Higashi 12/15/2023, 7:26 PM

## 2023-12-16 ENCOUNTER — Encounter: Payer: Self-pay | Admitting: Internal Medicine

## 2023-12-16 ENCOUNTER — Inpatient Hospital Stay (HOSPITAL_COMMUNITY)

## 2023-12-16 DIAGNOSIS — I639 Cerebral infarction, unspecified: Secondary | ICD-10-CM | POA: Diagnosis not present

## 2023-12-16 DIAGNOSIS — J9859 Other diseases of mediastinum, not elsewhere classified: Secondary | ICD-10-CM | POA: Diagnosis not present

## 2023-12-16 LAB — GLUCOSE, CAPILLARY: Glucose-Capillary: 161 mg/dL — ABNORMAL HIGH (ref 70–99)

## 2023-12-16 MED ORDER — CLOPIDOGREL BISULFATE 75 MG PO TABS
75.0000 mg | ORAL_TABLET | Freq: Every day | ORAL | 1 refills | Status: DC
Start: 2023-12-17 — End: 2023-12-16

## 2023-12-16 MED ORDER — CLOPIDOGREL BISULFATE 75 MG PO TABS: 75.0000 mg | ORAL_TABLET | Freq: Every day | ORAL | 1 refills | Status: DC

## 2023-12-16 MED ORDER — EZETIMIBE 10 MG PO TABS: 10.0000 mg | ORAL_TABLET | Freq: Every day | ORAL | 1 refills | Status: DC

## 2023-12-16 MED ORDER — ASPIRIN 81 MG PO TBEC: 81.0000 mg | DELAYED_RELEASE_TABLET | Freq: Every day | ORAL | 0 refills | Status: AC

## 2023-12-16 MED ORDER — ASPIRIN 81 MG PO TBEC: 81.0000 mg | DELAYED_RELEASE_TABLET | Freq: Every day | ORAL | Status: DC

## 2023-12-16 MED ORDER — EZETIMIBE 10 MG PO TABS
10.0000 mg | ORAL_TABLET | Freq: Every day | ORAL | 1 refills | Status: DC
Start: 2023-12-17 — End: 2023-12-16

## 2023-12-16 MED ORDER — GADOBUTROL 1 MMOL/ML IV SOLN
8.0000 mL | Freq: Once | INTRAVENOUS | Status: AC | PRN
  Administered 2023-12-16: 8 mL via INTRAVENOUS

## 2023-12-16 NOTE — Care Management (Signed)
 Patient declined OP PT referral

## 2023-12-16 NOTE — Progress Notes (Signed)
      [address]       [place] [address]             [phone]        Subjective: Patient dressed in street clothes.  Wants to go home. Not sure he wants to pursue workup  Objective: Vital signs in last 24 hours: Temp:  [97.6 F (36.4 C)-98.4 F (36.9 C)] 97.8 F (36.6 C) (06/01 0800) Pulse Rate:  [78-95] 84 (06/01 0800) Cardiac Rhythm: Normal sinus rhythm (06/01 0700) Resp:  [18] 18 (06/01 0323) BP: (162-177)/(84-102) 177/96 (06/01 0800) SpO2:  [93 %] 93 % (06/01 0800)  Hemodynamic parameters for last 24 hours:    Intake/Output from previous day: No intake/output data recorded. Intake/Output this shift: No intake/output data recorded.  General appearance: alert, cooperative, and no distress  Lab Results: Recent Labs    12/14/23 1022  WBC 6.2  HGB 15.3  HCT 48.1  PLT 344   BMET:  Recent Labs    12/14/23 1022 12/15/23 0249  NA 139 142  K 3.9 3.3*  CL 101 106  CO2 27 24  GLUCOSE 228* 103*  BUN 23 24*  CREATININE 1.25* 1.10  CALCIUM  9.4 9.1    PT/INR: No results for input(s): "LABPROT", "INR" in the last 72 hours. ABG    Component Value Date/Time   PHART 7.476 (H) 11/29/2019 0251   HCO3 20.1 11/29/2019 0251   TCO2 21 (L) 11/29/2019 0251   ACIDBASEDEF 2.0 11/29/2019 0251   O2SAT 45.6 12/08/2019 0820   CBG (last 3)  Recent Labs    12/15/23 1556 12/15/23 2114 12/16/23 0713  GLUCAP 214* 144* 161*    Assessment/Plan: S/p CVA with good recovery Anterior mediastinal mass incidentally noted on imaging.  I reviewed the MR- appearance more consistent with neoplasm and no flow but cannot completely r/o the possibility of a thrombosed pseudoaneurysm  Radiology rec PET CT to further evaluate.  That would be extremely helpful.  This would be extremely difficult to biopsy and PET may identify a more accessible location.  Will follow up in office after PET    LOS: 2 days    Zelphia Higashi 12/16/2023

## 2023-12-16 NOTE — Discharge Instructions (Signed)
 Follow with Primary MD Helaine Llanos, MD in 7 days   Get CBC, CMP, 2 view Chest X ray -  checked next visit with your primary MD    Activity: As tolerated with Full fall precautions use walker/cane & assistance as needed  Disposition Home    Diet: Heart Healthy Low Carb, check CBGs QAC-HS  Special Instructions: If you have smoked or chewed Tobacco  in the last 2 yrs please stop smoking, stop any regular Alcohol  and or any Recreational drug use.  On your next visit with your primary care physician please Get Medicines reviewed and adjusted.  Please request your Prim.MD to go over all Hospital Tests and Procedure/Radiological results at the follow up, please get all Hospital records sent to your Prim MD by signing hospital release before you go home.  If you experience worsening of your admission symptoms, develop shortness of breath, life threatening emergency, suicidal or homicidal thoughts you must seek medical attention immediately by calling 911 or calling your MD immediately  if symptoms less severe.  You Must read complete instructions/literature along with all the possible adverse reactions/side effects for all the Medicines you take and that have been prescribed to you. Take any new Medicines after you have completely understood and accpet all the possible adverse reactions/side effects.   Do not drive when taking Pain medications.  Do not take more than prescribed Pain, Sleep and Anxiety Medications  Wear Seat belts while driving.

## 2023-12-16 NOTE — Discharge Summary (Signed)
 James Braun QVZ:[id number] DOB: 12-17-1940 DOA: 12/14/2023  PCP: Helaine Llanos, MD  Admit date: 12/14/2023  Discharge date: 12/16/2023  Admitted From: Home   Disposition:  Home   Recommendations for Outpatient Follow-up:   Follow up with PCP in 1-2 weeks  PCP Please obtain BMP/CBC, 2 view CXR in 1week,  (see Discharge instructions)   PCP Please follow up on the following pending results:     Home Health: None   Equipment/Devices: None  Consultations: Neuro, CVTS Discharge Condition: Stable     CODE STATUS: Full     Diet Recommendation: Heart Healthy Low Carb    Chief Complaint  Patient presents with   Weakness     Brief history of present illness from the day of admission and additional interim summary    83 y.o. male with medical history significant of history of DM-2, HTN, HFmrEF, CAD s/p CABG in 2021 followed by brief postoperative A-fib (currently not on any anticoagulation) who presented with right leg weakness for almost a week   Per patient-approximately 8 days back or so-he woke up in the morning with right leg weakness.  He felt like his leg was giving out on him when he was walking.  He describes a couple of falls due to right leg weakness.  He also describes a sensation of dizziness and feeling off balance and needing to hold onto the walls to walk.  The symptoms initially improved but then have come back intermittently throughout the week.  He subsequently spoke with his PCPs office and was advised to come to the emergency room for further evaluation.                                                                   Hospital Course   Acute CVA Unclear whether this is embolic (history of perioperative A-fib post CABG) or thromboembolic from intracranial atherosclerosis. Symptoms have  resolved, symptom free and wants to go home ASAP. Nonfocal exam Remains on aspirin /Plavix  - Asa 21 days, Plavix  lifelong Intolerant to statins in the past- placed on Zetia-inquiring by outpatient Repatha injections. Echo stable DC home on DAPT per Neuro   CAD s/p CABG 2021 Continue antiplatelets   Postoperative A-fib after CABG in 2021 Telemonitoring negative for A-fib overnight, wants to discuss options with his Cardiologist outpt.   Chronic HFmrEF Euvolemic Resume Entresto  on discharge Resume Jardiance .    DM-2 CBG stable on SSI Resume all oral hypoglycemics on discharge.   BPH Finasteride    GERD PPI   HTN - permissive HTN post CVA, resume home meds from tomorrow.  4 cm rounded/intermitted density anterior superior mediastinal mass MRI noted seen by CVTS, follow in the office.    Discharge diagnosis     Principal Problem:   Acute CVA (cerebrovascular accident) (  [employer])    Discharge instructions    Discharge Instructions     Discharge instructions   Complete by: As directed    Follow with Primary MD Helaine Llanos, MD in 7 days   Get CBC, CMP, 2 view Chest X ray -  checked next visit with your primary MD    Activity: As tolerated with Full fall precautions use walker/cane & assistance as needed  Disposition Home    Diet: Heart Healthy Low Carb, check CBGs QAC-HS  Special Instructions: If you have smoked or chewed Tobacco  in the last 2 yrs please stop smoking, stop any regular Alcohol  and or any Recreational drug use.  On your next visit with your primary care physician please Get Medicines reviewed and adjusted.  Please request your Prim.MD to go over all Hospital Tests and Procedure/Radiological results at the follow up, please get all Hospital records sent to your Prim MD by signing hospital release before you go home.  If you experience worsening of your admission symptoms, develop shortness of breath, life threatening emergency, suicidal or  homicidal thoughts you must seek medical attention immediately by calling 911 or calling your MD immediately  if symptoms less severe.  You Must read complete instructions/literature along with all the possible adverse reactions/side effects for all the Medicines you take and that have been prescribed to you. Take any new Medicines after you have completely understood and accpet all the possible adverse reactions/side effects.   Do not drive when taking Pain medications.  Do not take more than prescribed Pain, Sleep and Anxiety Medications  Wear Seat belts while driving.   Increase activity slowly   Complete by: As directed        Discharge Medications   Allergies as of 12/16/2023       Reactions   Lipitor [atorvastatin] Other (See Comments)   myalgias   Lisinopril    Morphine And Codeine Other (See Comments)    hypotension   Levofloxacin Rash   Septra [sulfamethoxazole-trimethoprim] Rash   Sulfamethoxazole Rash   Sulfamethoxazole-trimethoprim Rash        Medication List     TAKE these medications    alum hydroxide-mag trisilicate 80-20 MG Chew chewable tablet Commonly known as: GAVISCON  Chew 2 tablets by mouth 3 (three) times daily as needed for indigestion or heartburn.   aspirin  EC 81 MG tablet Take 1 tablet (81 mg total) by mouth daily for 20 days. Swallow whole.   clopidogrel  75 MG tablet Commonly known as: PLAVIX  Take 1 tablet (75 mg total) by mouth daily. Start taking on: December 17, 2023   Curcumin Powd by Does not apply route.   empagliflozin  10 MG Tabs tablet Commonly known as: Jardiance  Take 1 tablet (10 mg total) by mouth daily before breakfast.   Entresto  24-26 MG Generic drug: sacubitril -valsartan  Take 1 tablet by mouth 2 (two) times daily.   ezetimibe 10 MG tablet Commonly known as: ZETIA Take 1 tablet (10 mg total) by mouth daily. Start taking on: December 17, 2023   ferrous sulfate 325 (65 FE) MG tablet Take 325 mg by mouth daily with  breakfast.   finasteride  5 MG tablet Commonly known as: PROSCAR  Take 1 tablet (5 mg total) by mouth daily.   Lumigan 0.01 % Soln Generic drug: bimatoprost Place 1 drop into the left eye daily.   metFORMIN  500 MG 24 hr tablet Commonly known as: GLUCOPHAGE -XR Take 1 tablet (500 mg total) by mouth 2 (two) times daily.  multivitamin tablet Take 1 tablet by mouth daily.   NUTRITIONAL SUPPLEMENT PO Take by mouth. Curamin   omeprazole  20 MG capsule Commonly known as: PRILOSEC TAKE 1 CAPSULE BY MOUTH EVERY DAY   OVER THE COUNTER MEDICATION HA Joint Formula: Vit C, Magnesium , Collagen, Quercetin, Olive Extract, Boswellia   OVER THE COUNTER MEDICATION Joint Supplement   Potassium Citrate  15 MEQ (1620 MG) Tbcr Take 1 tablet by mouth 3 (three) times daily.   sildenafil  20 MG tablet Commonly known as: REVATIO  TAKE 3-5 TABLETS BY MOUTH EVERY DAY AS NEEDED   Simbrinza 1-0.2 % Susp Generic drug: Brinzolamide-Brimonidine Place 1 drop into the left eye 2 (two) times daily.   timolol  0.5 % ophthalmic solution Commonly known as: TIMOPTIC  Place 1 drop into both eyes 2 (two) times daily.   TYLENOL  8 HOUR ARTHRITIS PAIN PO Take by mouth.   Vitamin B-12 1000 MCG Subl Place 1,000 mcg under the tongue. 1000 mcg SL daily   vitamin D3 25 MCG tablet Commonly known as: CHOLECALCIFEROL  Take 1 tablet by mouth 2 (two) times daily.         Follow-up Information     Helaine Llanos, MD. Schedule an appointment as soon as possible for a visit in 1 week(s).   Specialties: Internal Medicine, Pediatrics Contact information: [address] [place] [address] [phone]         [facility] Follow up.   Specialty: Neurology Why: Office will call with date/time, If you dont hear from them,please give them a call Contact information: [address] [place]  [address] [phone]        Zelphia Higashi, MD. Schedule an appointment as soon as possible for a visit in 1 week(s).   Specialty: Cardiothoracic Surgery Contact information: [address] [place] [address] [phone]         Hugh Madura, MD. Schedule an appointment as soon as possible for a visit in 1 week(s).   Specialty: Cardiology Why: aCVA, ? eliquis  Contact information: [address] [place] [address] [phone]                 Major procedures and Radiology Reports - PLEASE review detailed and final reports thoroughly  -         MR CHEST W WO CONTRAST Result Date: 12/16/2023 CLINICAL DATA:  Anterior mediastinal mass. EXAM: MR CHEST WITH AND WITHOUT CONTRAST TECHNIQUE: Multiplanar, multisequence MR imaging of the chest was performed before and after the administration of intravenous contrast. CONTRAST:  8mL GADAVIST GADOBUTROL 1 MMOL/ML IV SOLN COMPARISON:  CT chest 12/15/2023. FINDINGS: Heart/vascular: Normal heart size. No substantial pericardial effusion. Ascending thoracic aorta measures on the order of 3.2 cm diameter. No dissection of the thoracic aorta evident. Arterial aortic arch branch vessel anatomy shows normal enhancement. Patient is status post CABG better demonstrated on recent chest CT. Mediastinum/lymph nodes: As noted on recent CT, there is a 5.7 x 4.6 x 4.6 cm mass lesion in the anterior mediastinum, contiguous with the anterior wall of the ascending aorta and anteromedial wall of the main pulmonary artery. The lesion insinuates posteriorly between the aorta and main pulmonary artery. Intermediate signal intensity noted in the lesion on precontrast T1 imaging with intermediate to high signal on T2 haste imaging. After IV contrast administration, there is no evidence for contrast flow into the lesion on arterial phase MRA imaging. Postcontrast fat suppressed T1 gradient imaging shows heterogeneous irregular peripheral enhancement  with some mural irregularity/nodularity  (see axial T1 postcontrast image 42 of series 33). Cranial margin of the lesion is contiguous with the left brachiocephalic vein is a crosses the midline. No evidence for mediastinal lymphadenopathy. Large hiatal hernia noted. Lungs: Unremarkable MR appearance of the lungs bilaterally. Pertinent negatives include no evidence for pleural effusion or pleural nodularity. Upper abdomen: Unremarkable. Bones: No focal suspicious marrow enhancement within the visualized bony anatomy. IMPRESSION: 1. 5.7 x 4.6 x 4.6 cm mass lesion in the anterior mediastinum, contiguous with the anterior wall of the ascending aorta and anteromedial wall of the main pulmonary artery. The lesion insinuates posteriorly between the aorta and main pulmonary artery. Postcontrast fat suppressed T1 gradient imaging shows heterogeneous irregular peripheral enhancement with some mural irregularity/nodularity inferiorly and to the right. Thymoma/Thymic carcinoma remains a distinct concern. As noted on the CT, the coronary grafts arising from the anterior wall of the ascending thoracic aorta cannot be definitely discriminated as separate from this lesion on the CT or MRI and while there is certainly no contrast flow into the lesion on arterial phase MRI imaging to suggest patent pseudoaneurysm, thrombosed pseudoaneurysm is not completely excluded. Germ cell neoplasm and lymphoma remain within the differential. Complicated pericardial cyst also in the differential although the irregular rim enhancement would suggest potential prior hemorrhage or infection. PET-CT may prove helpful to determine whether not this lesion is hypermetabolic. Gated coronary CTA may provide more definitive characterization of the coronary graft anatomy. 2. Large hiatal hernia. Electronically Signed   By: Donnal Fusi M.D.   On: 12/16/2023 10:12   ECHOCARDIOGRAM COMPLETE Result Date: 12/15/2023    ECHOCARDIOGRAM REPORT   Patient Name:   JOHNGABRIEL VERDE Date of Exam: 12/15/2023  Medical Rec #:  [id number]      Height:       68.0 in Accession #:    [id number]     Weight:       140.0 lb Date of Birth:  04-09-1941      BSA:          1.756 m Patient Age:    82 years       BP:           164/99 mmHg Patient Gender: M              HR:           83 bpm. Exam Location:  Inpatient Procedure: 2D Echo, Cardiac Doppler and Color Doppler (Both Spectral and Color            Flow Doppler were utilized during procedure). Indications:    Stroke I63.9  History:        Patient has prior history of Echocardiogram examinations, most                 recent 05/19/2020. Risk Factors:Hypertension.  Sonographer:    Jeralene Mom Referring Phys: [id number] Tylene Galla M GHIMIRE IMPRESSIONS  1. Left ventricular ejection fraction, by estimation, is 45 to 50%. The left ventricle has mildly decreased function. The left ventricle demonstrates global hypokinesis. Left ventricular diastolic parameters are consistent with Grade I diastolic dysfunction (impaired relaxation). Elevated left atrial pressure.  2. Right ventricular systolic function is normal. The right ventricular size is normal. There is normal pulmonary artery systolic pressure.  3. The mitral valve is normal in structure. No evidence of mitral valve regurgitation. No evidence of mitral stenosis.  4. The tricuspid valve is abnormal.  5. The aortic valve is tricuspid. There is  moderate calcification of the aortic valve. There is moderate thickening of the aortic valve. Aortic valve regurgitation is mild. No aortic stenosis is present.  6. The inferior vena cava is normal in size with greater than 50% respiratory variability, suggesting right atrial pressure of 3 mmHg. FINDINGS  Left Ventricle: Left ventricular ejection fraction, by estimation, is 45 to 50%. The left ventricle has mildly decreased function. The left ventricle demonstrates global hypokinesis. The left ventricular internal cavity size was normal in size. There is  no left ventricular hypertrophy. Left  ventricular diastolic parameters are consistent with Grade I diastolic dysfunction (impaired relaxation). Elevated left atrial pressure. Right Ventricle: The right ventricular size is normal. Right vetricular wall thickness was not well visualized. Right ventricular systolic function is normal. There is normal pulmonary artery systolic pressure. The tricuspid regurgitant velocity is 2.67 m/s, and with an assumed right atrial pressure of 3 mmHg, the estimated right ventricular systolic pressure is 31.5 mmHg. Left Atrium: Left atrial size was normal in size. Right Atrium: Right atrial size was normal in size. Pericardium: There is no evidence of pericardial effusion. Mitral Valve: The mitral valve is normal in structure. There is mild thickening of the mitral valve leaflet(s). There is mild calcification of the mitral valve leaflet(s). Mild mitral annular calcification. No evidence of mitral valve regurgitation. No evidence of mitral valve stenosis. MV peak gradient, 7.5 mmHg. The mean mitral valve gradient is 2.0 mmHg. Tricuspid Valve: The tricuspid valve is abnormal. Tricuspid valve regurgitation is mild . No evidence of tricuspid stenosis. Aortic Valve: The aortic valve is tricuspid. There is moderate calcification of the aortic valve. There is moderate thickening of the aortic valve. There is moderate aortic valve annular calcification. Aortic valve regurgitation is mild. Aortic regurgitation PHT measures 413 msec. No aortic stenosis is present. Aortic valve mean gradient measures 6.5 mmHg. Aortic valve peak gradient measures 12.6 mmHg. Aortic valve area, by VTI measures 1.53 cm. Pulmonic Valve: The pulmonic valve was not well visualized. Pulmonic valve regurgitation is mild. No evidence of pulmonic stenosis. Aorta: The aortic root and ascending aorta are structurally normal, with no evidence of dilitation. Venous: The inferior vena cava is normal in size with greater than 50% respiratory variability, suggesting  right atrial pressure of 3 mmHg. IAS/Shunts: No atrial level shunt detected by color flow Doppler.  LEFT VENTRICLE PLAX 2D LVIDd:         4.20 cm      Diastology LVIDs:         3.30 cm      LV e' medial:    4.46 cm/s LV PW:         0.95 cm      LV E/e' medial:  19.7 LV IVS:        0.95 cm      LV e' lateral:   8.16 cm/s LVOT diam:     2.10 cm      LV E/e' lateral: 10.8 LV SV:         55 LV SV Index:   31 LVOT Area:     3.46 cm  LV Volumes (MOD) LV vol d, MOD A2C: 101.0 ml LV vol d, MOD A4C: 82.7 ml LV vol s, MOD A2C: 60.0 ml LV vol s, MOD A4C: 60.6 ml LV SV MOD A2C:     41.0 ml LV SV MOD A4C:     82.7 ml LV SV MOD BP:      31.0 ml RIGHT VENTRICLE RV Basal diam:  3.60 cm RV Mid diam:    3.10 cm RV S prime:     9.79 cm/s TAPSE (M-mode): 1.6 cm LEFT ATRIUM             Index        RIGHT ATRIUM           Index LA diam:        2.30 cm 1.31 cm/m   RA Area:     12.40 cm LA Vol (A2C):   44.8 ml 25.51 ml/m  RA Volume:   25.50 ml  14.52 ml/m LA Vol (A4C):   40.4 ml 23.00 ml/m LA Biplane Vol: 43.5 ml 24.77 ml/m  AORTIC VALVE                     PULMONIC VALVE AV Area (Vmax):    1.70 cm      PR End Diast Vel: 7.84 msec AV Area (Vmean):   1.75 cm AV Area (VTI):     1.53 cm AV Vmax:           177.50 cm/s AV Vmean:          117.000 cm/s AV VTI:            0.361 m AV Peak Grad:      12.6 mmHg AV Mean Grad:      6.5 mmHg LVOT Vmax:         87.30 cm/s LVOT Vmean:        59.000 cm/s LVOT VTI:          0.159 m LVOT/AV VTI ratio: 0.44 AI PHT:            413 msec  AORTA Ao Root diam: 3.00 cm Ao Asc diam:  3.20 cm MITRAL VALVE                TRICUSPID VALVE MV Area (PHT): 3.53 cm     TR Peak grad:   28.5 mmHg MV Peak grad:  7.5 mmHg     TR Vmax:        267.00 cm/s MV Mean grad:  2.0 mmHg MV Vmax:       1.37 m/s     SHUNTS MV Vmean:      61.1 cm/s    Systemic VTI:  0.16 m MV Decel Time: 215 msec     Systemic Diam: 2.10 cm MV E velocity: 87.80 cm/s MV A velocity: 136.00 cm/s MV E/A ratio:  0.65 Armida Lander MD Electronically  signed by Armida Lander MD Signature Date/Time: 12/15/2023/3:24:52 PM    Final    CT CHEST W CONTRAST Result Date: 12/15/2023 CLINICAL DATA:  Mediastinal mass on neck CTA EXAM: CT CHEST WITH CONTRAST TECHNIQUE: Multidetector CT imaging of the chest was performed during intravenous contrast administration. RADIATION DOSE REDUCTION: This exam was performed according to the departmental dose-optimization program which includes automated exposure control, adjustment of the mA and/or kV according to patient size and/or use of iterative reconstruction technique. CONTRAST:  50mL OMNIPAQUE  IOHEXOL  350 MG/ML SOLN COMPARISON:  Neck CTA 12/14/2023 FINDINGS: Cardiovascular: The heart size is normal. No substantial pericardial effusion. Coronary artery calcification is evident. Moderate atherosclerotic calcification is noted in the wall of the thoracic aorta. Status post CABG. Mediastinum/Nodes: 5.9 x 4.2 x 5.1 cm low-density lesion is identified anterior mediastinum with relatively thick ill-defined circumferential rim enhancement. This lesion is contiguous with the ascending thoracic aorta at the level of the coronary grafts and along the right  lateral aspect of the pulmonary trunk. There is some extension of the lesion into the space between the pulmonary trunk in the ascending thoracic aorta (image 55/3). No mediastinal lymphadenopathy. There is no hilar lymphadenopathy. Large hiatal hernia. The esophagus has normal imaging features. There is no axillary lymphadenopathy. Lungs/Pleura: No suspicious pulmonary nodule or mass. Calcified granuloma noted left upper lobe. No focal airspace consolidation. There is no evidence of pleural effusion. Upper Abdomen: Visualized portion of the upper abdomen shows no acute findings. Musculoskeletal: No worrisome lytic or sclerotic osseous abnormality. IMPRESSION: 1. 5.9 x 4.2 x 5.1 cm low-density lesion in the anterior mediastinum with relatively thick ill-defined circumferential rim  enhancement. This lesion is contiguous with the ascending thoracic aorta at the level of the coronary grafts and along the right lateral aspect of the pulmonary trunk. Thymoma/thymic carcinoma is a concern. Given proximity to the coronary grafts, thrombosed pseudoaneurysm also a consideration. Chronic hematoma or infected pericardial cyst not excluded. Lymphoma also a consideration. Cardiac echo may prove helpful to further evaluate. MRI chest with and without contrast would also be a possible follow-up imaging modality. 2. Large hiatal hernia. 3.  Aortic Atherosclerosis (ICD10-I70.0). Electronically Signed   By: Donnal Fusi M.D.   On: 12/15/2023 13:52   MR Brain Wo Contrast (neuro protocol) Result Date: 12/14/2023 CLINICAL DATA:  Provided history: Neuro deficit, acute, stroke suspected. Additional history provided: Right leg weakness. Dizziness. EXAM: MRI HEAD WITHOUT CONTRAST TECHNIQUE: Multiplanar, multiecho pulse sequences of the brain and surrounding structures were obtained without intravenous contrast. COMPARISON:  Non-contrast head CT and CT angiogram head/neck 12/14/2023. Brain MRI 06/07/2020. FINDINGS: Brain: Mild generalized cerebral atrophy. Small patchy acute cortically-based infarcts within the posterior left frontal lobe and left parietal lobe (ACA vascular territory). Background mild multifocal T2 FLAIR hyperintense signal abnormality within the cerebral white matter, nonspecific but compatible with chronic small vessel ischemic disease. These findings are similar to the prior MRI of 06/07/2020. Left parieto-occipital chronic microhemorrhage, new from the prior MRI. No evidence of an intracranial mass. No extra-axial fluid collection. No midline shift. Vascular: A severe, near-occlusive stenosis is noted within the left anterior cerebral artery A2 segment on the CTA performed earlier today. Please refer to this prior examination for additional vascular findings. Skull and upper cervical spine:  No focal worrisome marrow lesion. Incompletely assessed cervical spondylosis. Sinuses/Orbits: No mass or acute finding within the imaged orbits. No significant paranasal sinus disease. Other: Small atlantooccipital joint effusion on the left. Impression #1 called by telephone at the time of interpretation on 12/14/2023 at 2:00 pm to provider [facility] , who verbally acknowledged these results. IMPRESSION: 1. Small patchy acute cortically-based infarcts within the posterior left frontal lobe and left parietal lobe (ACA vascular territory). A severe, near-occlusive stenosis is noted within the left anterior cerebral artery A2 segment on the CTA performed earlier today. 2. Background parenchymal atrophy and chronic small vessel ischemic disease, as described. Electronically Signed   By: Bascom Lily D.O.   On: 12/14/2023 14:03   CT ANGIO HEAD NECK W WO CM Result Date: 12/14/2023 CLINICAL DATA:  83 year old male neurologic deficit. Right lower extremity weakness, dizziness. EXAM: CT ANGIOGRAPHY HEAD AND NECK WITH AND WITHOUT CONTRAST TECHNIQUE: Multidetector CT imaging of the head and neck was performed using the standard protocol during bolus administration of intravenous contrast. Multiplanar CT image reconstructions and MIPs were obtained to evaluate the vascular anatomy. Carotid stenosis measurements (when applicable) are obtained utilizing NASCET criteria, using the distal internal carotid diameter as the  denominator. RADIATION DOSE REDUCTION: This exam was performed according to the departmental dose-optimization program which includes automated exposure control, adjustment of the mA and/or kV according to patient size and/or use of iterative reconstruction technique. CONTRAST:  75mL OMNIPAQUE  IOHEXOL  350 MG/ML SOLN COMPARISON:  Head CT 02/12/2020.  Brain MRI 06/07/2020. FINDINGS: CT HEAD Brain: Cerebral volume remains normal for age. No midline shift, ventriculomegaly, mass effect, evidence of mass  lesion, intracranial hemorrhage or evidence of cortically based acute infarction. Gray-white matter differentiation is within normal limits throughout the brain. Match that patchy bilateral cerebral white matter hypodensity appears moderate for age, not significantly changed from 2021. Calvarium and skull base: Chronic right TMJ degeneration. Calvarium intact. No acute osseous abnormality identified. Paranasal sinuses: Visualized paranasal sinuses and mastoids are clear. Orbits: Postoperative changes to the right globe. No gaze deviation. Visualized scalp soft tissues are within normal limits. CTA NECK Skeleton: Partially visible sternotomy. Cervical spine degeneration. No acute osseous abnormality identified. Upper chest: Evidence of previous CABG but there is a rounded intermediate density 4.4 cm mass partially visible in the anterior superior mediastinum, abutting the ventral proximal arch on series 7, image 169. No superior mediastinal mass effect is evident. No surrounding lymphadenopathy. Other neck: Nonvascular neck soft tissue spaces are within normal limits. Aortic arch: Calcified aortic atherosclerosis. CABG. Three vessel arch. Right carotid system: Tortuous brachiocephalic artery and right CCA origin with only mild plaque and no stenosis. Mild plaque at the right ICA origin and bulb, tortuosity, no stenosis. Left carotid system: Similar tortuosity. Minimal plaque. No stenosis. Vertebral arteries: Proximal right subclavian artery and right vertebral artery origin are patent with no significant plaque or stenosis. Late entry of the right vertebral into the cervical transverse foramen, and appears non dominant on series 5, image 244. Right vertebral remains patent to the skull base with no extracranial stenosis. See intracranial findings below. Proximal left subclavian artery plaque without stenosis. Calcified plaque at the left vertebral artery origin without stenosis. Dominant left vertebral artery is  tortuous and patent to the skull base with no significant stenosis. CTA HEAD Posterior circulation: Right vertebral V4 segment is occluded distal to the right PICA origin which remains faintly enhancing. This appears similar to the 2021 brain MRI flow void appearance. Dominant left vertebral artery supplies the basilar without stenosis. Left PICA origin is patent. Patent basilar artery without stenosis. Mild basilar artery irregularity. Patent SCA and PCA origins. Tortuous P1 segments. Bilateral PCA branches are patent with no significant stenosis. Anterior circulation: Patent ICA siphons with tortuosity. Mild siphon calcified plaque. No stenosis. Patent carotid termini. Left posterior communicating artery is present, normal. The right posterior communicating is diminutive or absent. Patent carotid termini, MCA and ACA origins. Dominant right A1. Tortuous A1 segments and fenestrated, ectatic anterior communicating artery. But no discrete ACA aneurysm. Bilateral ACA branches are within normal limits., bilateral ACA branches are patent. The right ACA remains dominant. There is mild left A3 stenosis on series 12, image 25. Left MCA M1 segment and bifurcation are tortuous, patent without stenosis. Right MCA M1 segment and bifurcation are patent and tortuous without stenosis. Bilateral MCA branches appear patent with no significant stenosis. Venous sinuses: Early contrast timing, not evaluated. Anatomic variants: Dominant left vertebral artery which supplies the basilar. Dominant right ACA A1. Fetal type left PCA origin. Review of the MIP images confirms the above findings IMPRESSION: 1. 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 Recommend follow-up  CT Chest or CTA to further characterize. Aortic Atherosclerosis (ICD10-I70.0). 2. 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 3. No acute intracranial abnormality by CT. Moderate for age cerebral white matter changes, stable since 2021. Electronically Signed   By: Marlise Simpers M.D.   On: 12/14/2023 11:49    Micro Results     No results found for this or any previous visit (from the past 240 hours).  Today   Subjective    James Braun today has no headache,no chest abdominal pain,no new weakness tingling or numbness, feels much better wants to go home today.     Objective   Blood pressure (!) 177/96, pulse 84, temperature 97.8 F (36.6 C), temperature source Oral, resp. rate 18, height 5\' 8"  (1.727 m), weight 63.5 kg, SpO2 93%.  No intake or output data in the 24 hours ending 12/16/23 1143  Exam  Awake Alert, No new F.N deficits,    [place].AT,PERRAL Supple Neck,   Symmetrical Chest wall movement, Good air movement bilaterally, CTAB RRR,No Gallops,   +ve B.Sounds, Abd Soft, Non tender,  No Cyanosis, Clubbing or edema    Data Review   Recent Labs  Lab 12/14/23 1022  WBC 6.2  HGB 15.3  HCT 48.1  PLT 344  MCV 89.4  MCH 28.4  MCHC 31.8  RDW 15.6*    Recent Labs  Lab 12/14/23 1022 12/15/23 0249  NA 139 142  K 3.9 3.3*  CL 101 106  CO2 27 24  ANIONGAP 11 12  GLUCOSE 228* 103*  BUN 23 24*  CREATININE 1.25* 1.10  AST 14*  --   ALT 14  --   ALKPHOS 45  --   BILITOT 0.7  --   ALBUMIN  3.7  --   HGBA1C  --  7.6*  CALCIUM  9.4 9.1    Total Time in preparing paper work, data evaluation and todays exam - 35 minutes  Signature  -    Lynnwood Sauer M.D on 12/16/2023 at 11:43 AM   -  To page go to [email or web address]

## 2023-12-17 ENCOUNTER — Other Ambulatory Visit: Payer: Self-pay | Admitting: Thoracic Surgery (Cardiothoracic Vascular Surgery)

## 2023-12-17 ENCOUNTER — Telehealth: Payer: Self-pay | Admitting: *Deleted

## 2023-12-17 DIAGNOSIS — J9859 Other diseases of mediastinum, not elsewhere classified: Secondary | ICD-10-CM

## 2023-12-17 NOTE — Telephone Encounter (Signed)
 Called and spoke to pt. The nurse had called and set him up with an OV 12-18-23 because he could not make it in today.

## 2023-12-17 NOTE — Transitions of Care (Post Inpatient/ED Visit) (Signed)
   12/17/2023  Name: James Braun MRN: [id number] DOB: [birth year]  Today's TOC FU Call Status: Today's TOC FU Call Status:: Unsuccessful Call (1st Attempt) Unsuccessful Call (1st Attempt) Date: 12/17/23  Attempted to reach the patient regarding the most recent Inpatient/ED visit.  Follow Up Plan: Additional outreach attempts will be made to reach the patient to complete the Transitions of Care (Post Inpatient/ED visit) call.   Arna Better RN, BSN [place]  [facility] RN Care Manager [phone]

## 2023-12-18 ENCOUNTER — Telehealth: Payer: Self-pay | Admitting: *Deleted

## 2023-12-18 NOTE — Transitions of Care (Post Inpatient/ED Visit) (Signed)
 12/18/2023  Name: James Braun MRN: [id number] DOB: May 04, 1941  Today's TOC FU Call Status: Today's TOC FU Call Status:: Successful TOC FU Call Completed TOC FU Call Complete Date: 12/18/23 Patient's Name and Date of Birth confirmed.  Transition Care Management Follow-up Telephone Call Date of Discharge: 12/16/23 Discharge Facility: [facility]) Type of Discharge: Inpatient Admission Primary Inpatient Discharge Diagnosis:: Acute CVA How have you been since you were released from the hospital?: Better Any questions or concerns?: No  Items Reviewed: Did you receive and understand the discharge instructions provided?: Yes Medications obtained,verified, and reconciled?: Yes (Medications Reviewed) Any new allergies since your discharge?: No Dietary orders reviewed?: Yes Type of Diet Ordered:: Heart Healthy Low Carb Do you have support at home?: Yes People in Home [RPT]: spouse Name of Support/Comfort Primary Source: wife/James Braun  Medications Reviewed Today: Medications Reviewed Today     Reviewed by Aura Leeds, RN (Registered Nurse) on 12/18/23 at 1426  Med List Status: <None>   Medication Order Taking? Sig Documenting Provider Last Dose Status Informant  Acetaminophen  (TYLENOL  8 HOUR ARTHRITIS PAIN PO) [id number] Yes Take by mouth. [provider] Taking Active   alum hydroxide-mag trisilicate (GAVISCON ) 80-20 MG CHEW chewable tablet [id number] Yes Chew 2 tablets by mouth 3 (three) times daily as needed for indigestion or heartburn. [provider] Taking Active Self  aspirin  EC 81 MG tablet [id number] Yes Take 1 tablet (81 mg total) by mouth daily for 20 days. Swallow whole. Cala Castleman, MD Taking Active   clopidogrel  (PLAVIX ) 75 MG tablet [id number] No Take 1 tablet (75 mg total) by mouth daily.  Patient not taking: Reported on 12/18/2023   Singh, Prashant K, MD Not Taking Active            Med Note (Amos Gaber A   Tue Dec 18, 2023  2:21 PM) Patient  will discuss with PCP on 12/19/23  Cyanocobalamin  (VITAMIN B-12) 1000 MCG SUBL [id number] Yes Place 1,000 mcg under the tongue. 1000 mcg SL daily [provider] Taking Active Self  empagliflozin  (JARDIANCE ) 10 MG TABS tablet [id number] Yes Take 1 tablet (10 mg total) by mouth daily before breakfast. Helaine Llanos, MD Taking Active   ezetimibe (ZETIA) 10 MG tablet [id number] Yes Take 1 tablet (10 mg total) by mouth daily. Singh, Prashant K, MD Taking Active   ferrous sulfate 325 (65 FE) MG tablet [id number] Yes Take 325 mg by mouth daily with breakfast. [provider] Taking Active Self  finasteride  (PROSCAR ) 5 MG tablet [id number] Yes Take 1 tablet (5 mg total) by mouth daily. Helaine Llanos, MD Taking Active   LUMIGAN 0.01 % SOLN [id number] Yes Place 1 drop into the left eye daily. [provider] Taking Active   metFORMIN  (GLUCOPHAGE -XR) 500 MG 24 hr tablet [id number] Yes Take 1 tablet (500 mg total) by mouth 2 (two) times daily. Helaine Llanos, MD Taking Active   Multiple Vitamin (MULTIVITAMIN) tablet [id number] Yes Take 1 tablet by mouth daily. [provider] Taking Active Self  Nutritional Supplements (NUTRITIONAL SUPPLEMENT PO) [id number] Yes Take by mouth. Curamin [provider] Taking Active   omeprazole  (PRILOSEC) 20 MG capsule [id number] Yes TAKE 1 CAPSULE BY MOUTH EVERY DAY Helaine Llanos, MD Taking Active   OVER THE COUNTER MEDICATION [id number] Yes HA Joint Formula: Vit C, Magnesium , Collagen, Quercetin, Olive Extract, Boswellia [provider] Taking Active   OVER THE COUNTER MEDICATION [id number]  Joint Supplement [provider]  Active  Potassium Citrate  15 MEQ (1620 MG) TBCR [id number] Yes Take 1 tablet by mouth 3 (three) times daily. Helaine Llanos, MD Taking Active   sacubitril -valsartan  (ENTRESTO ) 24-26 MG [id number] Yes Take 1 tablet by mouth 2 (two) times daily. Marlyse Single T, PA-C Taking Active   sildenafil   (REVATIO ) 20 MG tablet [id number] Yes TAKE 3-5 TABLETS BY MOUTH EVERY DAY AS NEEDED Helaine Llanos, MD Taking Active   SIMBRINZA 1-0.2 % SUSP [id number] Yes Place 1 drop into the left eye 2 (two) times daily. [provider] Taking Active   timolol  (TIMOPTIC ) 0.5 % ophthalmic solution [id number] Yes Place 1 drop into both eyes 2 (two) times daily.  [provider] Taking Active Self  Turmeric, Fermin Hove, (CURCUMIN) POWD [id number] Yes by Does not apply route. [provider] Taking Active   Vitamin D3 (VITAMIN D ) 25 MCG tablet [id number] Yes Take 1 tablet by mouth 2 (two) times daily. [provider] Taking Active Self            Home Care and Equipment/Supplies: Were Home Health Services Ordered?: No Any new equipment or medical supplies ordered?: No  Functional Questionnaire: Do you need assistance with bathing/showering or dressing?: No Do you need assistance with meal preparation?: No Do you need assistance with eating?: No Do you have difficulty maintaining continence: No Do you need assistance with getting out of bed/getting out of a chair/moving?: No Do you have difficulty managing or taking your medications?: No  Follow up appointments reviewed: PCP Follow-up appointment confirmed?: Yes Date of PCP follow-up appointment?: 12/19/23 Follow-up Provider: Dr. Joelle Musca Specialist [facility] Follow-up appointment confirmed?: Yes Date of Specialist follow-up appointment?: 01/02/24 Follow-Up Specialty Provider:: Dr. Luna Salinas Do you need transportation to your follow-up appointment?: No Do you understand care options if your condition(s) worsen?: Yes-patient verbalized understanding  SDOH Interventions Today    Flowsheet Row Most Recent Value  SDOH Interventions   Food Insecurity Interventions Intervention Not Indicated  Housing Interventions Intervention Not Indicated  Transportation Interventions Intervention Not Indicated  Utilities  Interventions Intervention Not Indicated       Arna Better RN, BSN [place]  [facility] RN Care Manager [phone]

## 2023-12-19 ENCOUNTER — Encounter: Payer: Self-pay | Admitting: Internal Medicine

## 2023-12-19 ENCOUNTER — Ambulatory Visit: Payer: Self-pay | Admitting: Internal Medicine

## 2023-12-19 ENCOUNTER — Ambulatory Visit (INDEPENDENT_AMBULATORY_CARE_PROVIDER_SITE_OTHER): Admitting: Internal Medicine

## 2023-12-19 ENCOUNTER — Ambulatory Visit: Attending: Internal Medicine

## 2023-12-19 VITALS — BP 116/86 | HR 73 | Temp 98.0°F | Ht 65.0 in | Wt 147.0 lb

## 2023-12-19 DIAGNOSIS — I5022 Chronic systolic (congestive) heart failure: Secondary | ICD-10-CM | POA: Diagnosis not present

## 2023-12-19 DIAGNOSIS — Z8673 Personal history of transient ischemic attack (TIA), and cerebral infarction without residual deficits: Secondary | ICD-10-CM | POA: Insufficient documentation

## 2023-12-19 DIAGNOSIS — Z7984 Long term (current) use of oral hypoglycemic drugs: Secondary | ICD-10-CM | POA: Diagnosis not present

## 2023-12-19 DIAGNOSIS — E785 Hyperlipidemia, unspecified: Secondary | ICD-10-CM

## 2023-12-19 DIAGNOSIS — N1831 Chronic kidney disease, stage 3a: Secondary | ICD-10-CM

## 2023-12-19 DIAGNOSIS — E1121 Type 2 diabetes mellitus with diabetic nephropathy: Secondary | ICD-10-CM

## 2023-12-19 DIAGNOSIS — D631 Anemia in chronic kidney disease: Secondary | ICD-10-CM | POA: Diagnosis not present

## 2023-12-19 LAB — RENAL FUNCTION PANEL
Albumin: 4.3 g/dL (ref 3.5–5.2)
BUN: 22 mg/dL (ref 6–23)
CO2: 31 meq/L (ref 19–32)
Calcium: 9.3 mg/dL (ref 8.4–10.5)
Chloride: 103 meq/L (ref 96–112)
Creatinine, Ser: 1.15 mg/dL (ref 0.40–1.50)
GFR: 58.74 mL/min — ABNORMAL LOW (ref >60.00)
Glucose, Bld: 147 mg/dL — ABNORMAL HIGH (ref 70–99)
Phosphorus: 3.1 mg/dL (ref 2.3–4.6)
Potassium: 3.8 meq/L (ref 3.5–5.1)
Sodium: 140 meq/L (ref 135–145)

## 2023-12-19 LAB — CBC
HCT: 47 % (ref 39.0–52.0)
Hemoglobin: 15.5 g/dL (ref 13.0–17.0)
MCHC: 32.9 g/dL (ref 30.0–36.0)
MCV: 86.7 fl (ref 78.0–100.0)
Platelets: 329 10*3/uL (ref 150.0–400.0)
RBC: 5.42 Mil/uL (ref 4.22–5.81)
RDW: 16.1 % — ABNORMAL HIGH (ref 11.5–15.5)
WBC: 5.8 10*3/uL (ref 4.0–10.5)

## 2023-12-19 LAB — HM DIABETES EYE EXAM

## 2023-12-19 MED ORDER — PANTOPRAZOLE SODIUM 40 MG PO TBEC: 40.0000 mg | DELAYED_RELEASE_TABLET | Freq: Every day | ORAL | 3 refills | Status: DC

## 2023-12-19 NOTE — Assessment & Plan Note (Signed)
 Compensated Back on his entresto  now

## 2023-12-19 NOTE — Assessment & Plan Note (Signed)
 Fortunately no residual Hadn't started the plavix --urged him to start today (had held his aspirin  before this so it might be related to this) Aspirin  for 3 weeks Will check zio monitor to be sure not atrial fib

## 2023-12-19 NOTE — Progress Notes (Signed)
 Subjective:    Patient ID: James Braun, male    DOB: 31-Mar-1941, 83 y.o.   MRN: [id number]  HPI Here for hospital follow up  Admitted 5/30---with right leg weakness. 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 (not related to acute symptoms) Sent home 6/1 Weakness has resolved and he feels back to normal  Had stopped aspirin  6 weeks ago due to easy bruising and rectal bleeding Didn't start the plavix  due to omeprazole  Rx--discussed  Also had anterior mediastinal mass Seen by cardiothoracic surgey Being scheduled for PET scan by Dr Luna Salinas  Was off the entresto  in hospital--and BP went up Now back on entresto --and BP better No chest pain or SOB  Current Outpatient Medications on File Prior to Visit  Medication Sig Dispense Refill   Acetaminophen  (TYLENOL  8 HOUR ARTHRITIS PAIN PO) Take by mouth.     alum hydroxide-mag trisilicate (GAVISCON ) 80-20 MG CHEW chewable tablet Chew 2 tablets by mouth 3 (three) times daily as needed for indigestion or heartburn.     aspirin  EC 81 MG tablet Take 1 tablet (81 mg total) by mouth daily for 20 days. Swallow whole. 20 tablet 0   clopidogrel  (PLAVIX ) 75 MG tablet Take 1 tablet (75 mg total) by mouth daily. 30 tablet 1   Cyanocobalamin  (VITAMIN B-12) 1000 MCG SUBL Place 1,000 mcg under the tongue. 1000 mcg SL daily     empagliflozin  (JARDIANCE ) 10 MG TABS tablet Take 1 tablet (10 mg total) by mouth daily before breakfast. 90 tablet 3   ezetimibe (ZETIA) 10 MG tablet Take 1 tablet (10 mg total) by mouth daily. 30 tablet 1   ferrous sulfate 325 (65 FE) MG tablet Take 325 mg by mouth daily with breakfast.     finasteride  (PROSCAR ) 5 MG tablet Take 1 tablet (5 mg total) by mouth daily. 90 tablet 3   LUMIGAN 0.01 % SOLN Place 1 drop into the left eye  daily.     metFORMIN  (GLUCOPHAGE -XR) 500 MG 24 hr tablet Take 1 tablet (500 mg total) by mouth 2 (two) times daily. 180 tablet 1   Multiple Vitamin (MULTIVITAMIN) tablet Take 1 tablet by mouth daily.     Nutritional Supplements (NUTRITIONAL SUPPLEMENT PO) Take by mouth. Curamin     omeprazole  (PRILOSEC) 20 MG capsule TAKE 1 CAPSULE BY MOUTH EVERY DAY 90 capsule 2   OVER THE COUNTER MEDICATION HA Joint Formula: Vit C, Magnesium , Collagen, Quercetin, Olive Extract, Boswellia     OVER THE COUNTER MEDICATION Joint Supplement     Potassium Citrate  15 MEQ (1620 MG) TBCR Take 1 tablet by mouth 3 (three) times daily. 270 tablet 3   sacubitril -valsartan  (ENTRESTO ) 24-26 MG Take 1 tablet by mouth 2 (two) times daily. 180 tablet 3   sildenafil  (REVATIO ) 20 MG tablet TAKE 3-5 TABLETS BY MOUTH EVERY DAY AS NEEDED 50 tablet 5   SIMBRINZA 1-0.2 % SUSP Place 1 drop into the left eye 2 (two) times daily.     timolol  (TIMOPTIC ) 0.5 % ophthalmic solution Place 1 drop into both eyes 2 (two) times daily.      Turmeric, Curcuma [place]a, (CURCUMIN) POWD by Does not apply route.     Vitamin D3 (VITAMIN D ) 25 MCG tablet Take 1 tablet by mouth 2 (two) times daily.     No current  facility-administered medications on file prior to visit.    Allergies  Allergen Reactions   Lipitor [Atorvastatin] Other (See Comments)    myalgias   Lisinopril    Morphine And Codeine Other (See Comments)     hypotension   Levofloxacin Rash   Septra [Sulfamethoxazole-Trimethoprim] Rash   Sulfamethoxazole Rash   Sulfamethoxazole-Trimethoprim Rash    Past Medical History:  Diagnosis Date   BPH (benign prostatic hypertrophy)    Cancer (HCC)    melanoma   Coronary artery disease    History of kidney stones    History of melanoma excision OCT 2010   Hyperlipidemia    Hypertension    Left ureteral calculus    Nephrolithiasis    bilateral   Type 2 diabetes mellitus (HCC)     Past Surgical History:  Procedure Laterality Date    CARDIAC CATHETERIZATION     CHOLECYSTECTOMY  01/1999   CORNEAL TRANSPLANT Right 2023   CORONARY ARTERY BYPASS GRAFT N/A 11/28/2019   Procedure: CORONARY ARTERY BYPASS GRAFTING (CABG) x3, using left internal mammary artery and Saphenous vein harvested endoscopically;  Surgeon: Heriberto London, MD;  Location: [facility] OR;  Service: Open Heart Surgery;  Laterality: N/A;   CYSTOSCOPY WITH STENT PLACEMENT Left 11/14/2012   Procedure: CYSTOSCOPY WITH STENT PLACEMENT;  Surgeon: Trent Frizzle, MD;  Location: [facility];  Service: Urology;  Laterality: Left;   CYSTOSCOPY/RETROGRADE/URETEROSCOPY/STONE EXTRACTION WITH BASKET Left 11/14/2012   Procedure: CYSTOSCOPY/RETROGRADE/URETEROSCOPY/STONE EXTRACTION WITH BASKET;  Surgeon: Trent Frizzle, MD;  Location[facility];  Service: Urology;  Laterality: Left;   EXTRACORPOREAL SHOCK WAVE LITHOTRIPSY  01/1999   X2   HOLMIUM LASER APPLICATION Left 11/14/2012   Procedure: HOLMIUM LASER APPLICATION;  Surgeon: Trent Frizzle, MD;  Location: [facility];  Service: Urology;  Laterality: Left;   LEFT HEART CATH AND CORONARY ANGIOGRAPHY N/A 11/27/2019   Procedure: LEFT HEART CATH AND CORONARY ANGIOGRAPHY;  Surgeon: Avanell Leigh, MD;  Location: [facility] INVASIVE [facility] LAB;  Service: Cardiovascular;  Laterality: N/A;   LEFT URETEROSCOPIC STONE EXTRACTION  02/22/2007   NEPHROLITHOTOMY Right 09/28/2010   percutaneous   ORIF CLAVICLE FRACTURE  2006   PROSTATE BIOPSY  1998   neg   SHOULDER ARTHROSCOPY  07/2013   Dr Genevive Ket   TEE WITHOUT CARDIOVERSION N/A 11/28/2019   Procedure: TRANSESOPHAGEAL ECHOCARDIOGRAM (TEE);  Surgeon: Matt Song, Donata Fryer, MD;  Location: [facility] OR;  Service: Open Heart Surgery;  Laterality: N/A;   TONSILLECTOMY      Family History  Problem Relation Age of Onset   Hypertension Mother    Diabetes Father    Hypertension Father    Colon polyps Brother    Coronary artery disease Neg Hx    Cancer Neg Hx     Esophageal cancer Neg Hx    Stomach cancer Neg Hx    Rectal cancer Neg Hx     Social History   Socioeconomic History   Marital status: Married    Spouse name: Not on file   Number of children: 2   Years of education: Not on file   Highest education level: Master's degree (e.g., MA, MS, MEng, MEd, MSW, MBA)  Occupational History   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   Occupation: Buys very distressed houses and rehabilitates   Occupation: Does mediation  Tobacco Use   Smoking status: Never    Passive exposure: Never   Smokeless tobacco: Never  Substance and Sexual Activity   Alcohol use: No   Drug use:  No   Sexual activity: Not on file  Other Topics Concern   Not on file  Social History Narrative   Has living will   Wife is health care POA---alternate is committee of physicians   Would accept resuscitation---wouldn't accept prolonged ventilation   No tube feeds if cognitively unaware   Right handed   Social Drivers of Health   Financial Resource Strain: Low Risk  (08/02/2023)   Overall Financial Resource Strain (CARDIA)    Difficulty of Paying Living Expenses: Not hard at all  Food Insecurity: No Food Insecurity (12/18/2023)   Hunger Vital Sign    Worried About Running Out of Food in the Last Year: Never true    Ran Out of Food in the Last Year: Never true  Transportation Needs: No Transportation Needs (12/18/2023)   PRAPARE - Administrator, Civil Service (Medical): No    Lack of Transportation (Non-Medical): No  Physical Activity: Insufficiently Active (08/02/2023)   Exercise Vital Sign    Days of Exercise per Week: 5 days    Minutes of Exercise per Session: 20 min  Stress: No Stress Concern Present (08/02/2023)   [employer] of Occupational Health - Occupational Stress Questionnaire    Feeling of Stress : Only a little  Social Connections: Socially Integrated (12/14/2023)   Social Connection and Isolation Panel [NHANES]    Frequency of  Communication with Friends and Family: Twice a week    Frequency of Social Gatherings with Friends and Family: Twice a week    Attends Religious Services: More than 4 times per year    Active Member of [employer] or Organizations: Yes    Attends Engineer, structural: More than 4 times per year    Marital Status: Married  Catering manager Violence: Not At Risk (12/18/2023)   Humiliation, Afraid, Rape, and Kick questionnaire    Fear of Current or Ex-Partner: No    Emotionally Abused: No    Physically Abused: No    Sexually Abused: No   Review of Systems Appetite is fine Sleeps okay Upset about the stroke--but not depressed    Objective:   Physical Exam Constitutional:      Appearance: Normal appearance.  Cardiovascular:     Rate and Rhythm: Normal rate and regular rhythm.     Heart sounds: No murmur heard.    No gallop.  Pulmonary:     Effort: Pulmonary effort is normal.     Breath sounds: Normal breath sounds. No wheezing or rales.  Musculoskeletal:     Cervical back: Neck supple.     Right lower leg: No edema.     Left lower leg: No edema.  Lymphadenopathy:     Cervical: No cervical adenopathy.  Neurological:     General: No focal deficit present.     Mental Status: He is alert.  Psychiatric:        Mood and Affect: Mood normal.        Behavior: Behavior normal.            Assessment & Plan:

## 2023-12-19 NOTE — Assessment & Plan Note (Signed)
 Continues on iron orally now Will change PPI to pantoprazole 

## 2023-12-19 NOTE — Assessment & Plan Note (Signed)
 Will refer to lipid clinic--should consider repatha

## 2023-12-19 NOTE — Patient Instructions (Addendum)
 Please start the plavix  today and continue on it indefinitely (unless we find you have atrial fibrillation) Stay on aspirin  till the end of June---then stop it Change the omeprazole  to pantoprazole 

## 2023-12-19 NOTE — Assessment & Plan Note (Signed)
 Lab Results  Component Value Date   HGBA1C 7.6 (H) 12/15/2023   Control is adequate on jardiance  10

## 2023-12-27 ENCOUNTER — Other Ambulatory Visit (HOSPITAL_COMMUNITY)

## 2024-01-01 ENCOUNTER — Encounter: Payer: Self-pay | Admitting: Physician Assistant

## 2024-01-01 ENCOUNTER — Ambulatory Visit: Attending: Physician Assistant | Admitting: Physician Assistant

## 2024-01-01 VITALS — BP 152/84 | HR 88 | Ht 67.0 in | Wt 145.8 lb

## 2024-01-01 DIAGNOSIS — I10 Essential (primary) hypertension: Secondary | ICD-10-CM

## 2024-01-01 DIAGNOSIS — I255 Ischemic cardiomyopathy: Secondary | ICD-10-CM | POA: Diagnosis not present

## 2024-01-01 DIAGNOSIS — R222 Localized swelling, mass and lump, trunk: Secondary | ICD-10-CM

## 2024-01-01 DIAGNOSIS — I480 Paroxysmal atrial fibrillation: Secondary | ICD-10-CM | POA: Diagnosis not present

## 2024-01-01 DIAGNOSIS — I639 Cerebral infarction, unspecified: Secondary | ICD-10-CM | POA: Diagnosis not present

## 2024-01-01 DIAGNOSIS — E785 Hyperlipidemia, unspecified: Secondary | ICD-10-CM

## 2024-01-01 DIAGNOSIS — I25810 Atherosclerosis of coronary artery bypass graft(s) without angina pectoris: Secondary | ICD-10-CM | POA: Diagnosis not present

## 2024-01-01 MED ORDER — ENTRESTO 49-51 MG PO TABS: 1.0000 | ORAL_TABLET | Freq: Two times a day (BID) | ORAL | 3 refills | Status: AC

## 2024-01-01 NOTE — Patient Instructions (Signed)
 Medication Instructions:  INCREASE ENTRESTO  TO 49-51 MG TWICE A DAY *If you need a refill on your cardiac medications before your next appointment, please call your pharmacy*  Lab Work: NO LABS If you have labs (blood work) drawn today and your tests are completely normal, you will receive your results only by: MyChart Message (if you have MyChart) OR A paper copy in the mail If you have any lab test that is abnormal or we need to change your treatment, we will call you to review the results.  Testing/Procedures: NO TESTING  Follow-Up: At [facility], you and your health needs are our priority.  As part of our continuing mission to provide you with exceptional heart care, our providers are all part of one team.  This team includes your primary Cardiologist (physician) and Advanced Practice Providers or APPs (Physician Assistants and Nurse Practitioners) who all work together to provide you with the care you need, when you need it.  Your next appointment:   6 week(s)  Provider:   Marlyse Single PA-C or Ervin Heath, PA-C  Other Instructions You have been referred to PharmD Lipid Clinic

## 2024-01-01 NOTE — Progress Notes (Unsigned)
 Cardiology Office Note   Date:  01/03/2024  ID:  ARLYN BUERKLE, DOB 15-Dec-1940, MRN [id number] PCP: Helaine Llanos, MD  [facility] Providers Cardiologist:  Dorothye Gathers, MD Advanced Heart Failure:  Jules Oar, MD     History of Present Illness EIDEN BAGOT is a 83 y.o. male with PMH of CAD s/p CABG May 2021 by Dr. Matt Song (LIMA-LAD, SVG-Dx, SVG-PL), postop atrial fibrillation, ICM, HTN, HLD, DM II, carotid artery disease, h/o melanoma and chronic dizziness.  During the hospitalization after his bypass surgery, he required amiodarone  and milrinone  for A-fib and shock, he quickly recovered.  Ejection fraction at the time of bypass surgery was 35 to 40%.  EF improved to 45 to 50% by repeat echocardiogram in November 2021.  Patient was most recently seen by Marlyse Single PA-C in April 2025 at which time he was doing well.  Blood pressure was borderline elevated, it was recommended for the patient to increase Entresto  to 49-51 mg twice a day if blood pressure continue to be elevated.  More recently, patient was admitted to the hospital on 12/14/2023 with right leg weakness.  CT of head and neck was positive for chronic appearing occlusion of the distal right vertebral artery V4 segment.  MRI showed small patchy acute cortically based infarcts within the posterior left frontal lobe and the left parietal lobe.  Severe near occlusive stenosis is noted within the left anterior cerebral artery.  Hemoglobin A1c was 7.6.  LDL 124.  Patient was seen by neurology service who recommended dual antiplatelet therapy including aspirin  and Plavix  for 3 months then Plavix  monotherapy afterward.  Neurology also recommended to consider 30-day heart monitor versus loop recorder upon discharge.  Echocardiogram obtained on 12/15/2023 showed EF 45 to 50%, grade 1 DD, mild AI.  CTA of the neck also showed part of anterior mediastinal mass.  Subsequent CT of the chest showed a 5 cm anterior mediastinal mass with  peripheral enhancement.  Differential diagnosis includes thymoma versus thymic carcinoma versus lymphoma.  Radiology also raised the issue of possible thrombosed pseudoaneurysm.  Subsequent MRI is more consistent with neoplasm but cannot completely rule out possibility of thrombosed pseudoaneurysm.  Radiology service recommended PET CT to further evaluate.   Patient presents today for follow-up.  He denies any chest pain or shortness of breath.  He is accompanied by his wife.  Blood pressure remain elevated after he resumed his home medication, I will increase Entresto  to 49-51 mg twice a day.  He has no lower extremity edema, orthopnea or PND.  He is still wearing the ZIO monitor, we will likely get the results of the ZIO monitor in the next few weeks.  He is currently on Zetia  and has a history of intolerance to Crestor  and Lipitor, he is interested in injectable medication to help lower his LDL.  I discussed with him PCSK9 inhibitor such as Praluent and Repatha versus newer medications such as inclisiran.  He is interested in inclisiran, I will refer him to our lipid clinic.  He has upcoming PET/CT imaging in July before follow-up with the Dr. Luna Salinas.  Overall, he is stable from the cardiac perspective.  If the current ZIO monitor does not show any A-fib, we will still have a low threshold of consider loop recorder in the future if he has palpitations, TIA or recurrent CVA.  Otherwise, we will see him back in 6 weeks.   ROS:   He denies chest pain, palpitations, dyspnea, pnd, orthopnea,  n, v, dizziness, syncope, edema, weight gain, or early satiety. All other systems reviewed and are otherwise negative except as noted above.    Studies Reviewed      Cardiac Studies & Procedures   ______________________________________________________________________________________________ CARDIAC CATHETERIZATION  CARDIAC CATHETERIZATION 11/27/2019  Conclusion Images from the original result were not  included.   Prox LAD to Mid LAD lesion is 95% stenosed.  3rd Diag lesion is 90% stenosed.  Prox RCA lesion is 95% stenosed.  Mid RCA lesion is 95% stenosed with 95% stenosed side branch in RV Branch.  Mid RCA to Dist RCA lesion is 95% stenosed.  There is moderate to severe left ventricular systolic dysfunction.  LV end diastolic pressure is mildly elevated.  The left ventricular ejection fraction is 25-35% by visual estimate.  DIN BOOKWALTER is a 83 y.o. male   [id number] LOCATION:  FACILITY: [facility] PHYSICIAN: Lauro Portal, M.D. 1940-10-15   DATE OF PROCEDURE:  11/27/2019  DATE OF DISCHARGE:     CARDIAC CATHETERIZATION    History obtained from chart review.CUAHUTEMOC ATTAR is a 83 y.o.  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  He has a 96% success rate.  He was referred by Dr. Billy Bue for cardiovascular evaluation because of new onset chest pain.  I last saw him in the office 06/17/2019. He does have a history of treated hypertension, diabetes and hyperlipidemia.  He is never had a heart attack or stroke.  He has no other cardiac risk factors.  He had a stressful event in his life 2 and half weeks ago chest pain subsequent to that and daily since.  The pain is not necessarily occur with exertion.  It occurred randomly lasted minutes at a time without radiation.  He does exercise 3 days a week to 30 minutes at a time on the treadmill and does intermittent fasting as well.  I performed nuclear stress testing 01/09/2018 which was low risk and nonischemic and obtain a 2D echocardiogram 01/07/2018 which was essentially unremarkable as well.  He continues to exercise, and is active without limitation.  His only complaint is he does not have the energy that he used to.  He was recently evaluated underwent endoscopy revealing a large hiatal hernia.  Is being considered for laparoscopic  hiatal hernia repair.  He was referred back to me by his gastroenterologist because of an exertional component to his chest pain to rule out an ischemic etiology.  A Myoview  stress test showed inferior scar without ischemia clearly different than his last stress test done 2 years ago.  Because of this I elected to proceed with outpatient diagnostic coronary angiography to define his anatomy.  Impression Mr. Vester has severe two-vessel disease with high-grade proximal LAD disease, ostial second diagonal branch disease and severe high-grade tandem lesions in a tortuous dominant RCA with grade 2-3 left-to-right collaterals.  He does have moderate least severe LV dysfunction with an EF in the 30 to 35% range and more prominent inferior wall motion abnormality.  I suspect his LV dysfunction is ischemically mediated and represents hibernating myocardium.  He will need CABG.  He is on aspirin .  T CTS (Dr. Jeb Miner) has been consulted.  The sheath was removed and a TR band was placed on the right wrist to achieve patent hemostasis.  The patient left the lab in stable condition.  Lauro Portal. MD, [facility] 11/27/2019 3:11 PM  Findings Coronary Findings Diagnostic  Dominance: Right  Left Anterior Descending Prox LAD to Mid LAD lesion is 95% stenosed.  Third Diagonal Branch 3rd Diag lesion is 90% stenosed.  Right Coronary Artery Prox RCA lesion is 95% stenosed. Mid RCA lesion is 95% stenosed with 95% stenosed side branch in RV Branch. Mid RCA to Dist RCA lesion is 95% stenosed.  Right Posterior Atrioventricular Artery Collaterals RPAV filled by collaterals from Dist LAD.  Intervention  No interventions have been documented.   STRESS TESTS  MYOCARDIAL PERFUSION IMAGING 11/19/2019  Narrative  Nuclear stress EF: 37%. The left ventricular ejection fraction is moderately decreased (30-44%).  There was no ST segment deviation noted during stress.  Defect 1: There is a large defect of severe  severity present in the basal inferoseptal, basal inferior, basal inferolateral, mid inferoseptal, mid inferior, mid inferolateral, apical anterior, apical septal, apical inferior, apical lateral and apex location.  Findings consistent with prior myocardial infarction. There is no evidence of ischemia .  This is an intermediate risk study.   ECHOCARDIOGRAM  ECHOCARDIOGRAM COMPLETE 12/15/2023  Narrative ECHOCARDIOGRAM REPORT    Patient Name:   MAXSON ODDO Date of Exam: 12/15/2023 Medical Rec #:  [id number]      Height:       68.0 in Accession #:    [id number]     Weight:       140.0 lb Date of Birth:  1941-05-16      BSA:          1.756 m Patient Age:    82 years       BP:           164/99 mmHg Patient Gender: M              HR:           83 bpm. Exam Location:  Inpatient  Procedure: 2D Echo, Cardiac Doppler and Color Doppler (Both Spectral and Color Flow Doppler were utilized during procedure).  Indications:    Stroke I63.9  History:        Patient has prior history of Echocardiogram examinations, most recent 05/19/2020. Risk Factors:Hypertension.  Sonographer:    Jeralene Mom Referring Phys: [id number] Tylene Galla M GHIMIRE  IMPRESSIONS   1. Left ventricular ejection fraction, by estimation, is 45 to 50%. The left ventricle has mildly decreased function. The left ventricle demonstrates global hypokinesis. Left ventricular diastolic parameters are consistent with Grade I diastolic dysfunction (impaired relaxation). Elevated left atrial pressure. 2. Right ventricular systolic function is normal. The right ventricular size is normal. There is normal pulmonary artery systolic pressure. 3. The mitral valve is normal in structure. No evidence of mitral valve regurgitation. No evidence of mitral stenosis. 4. The tricuspid valve is abnormal. 5. The aortic valve is tricuspid. There is moderate calcification of the aortic valve. There is moderate thickening of the aortic valve. Aortic  valve regurgitation is mild. No aortic stenosis is present. 6. The inferior vena cava is normal in size with greater than 50% respiratory variability, suggesting right atrial pressure of 3 mmHg.  FINDINGS Left Ventricle: Left ventricular ejection fraction, by estimation, is 45 to 50%. The left ventricle has mildly decreased function. The left ventricle demonstrates global hypokinesis. The left ventricular internal cavity size was normal in size. There is no left ventricular hypertrophy. Left ventricular diastolic parameters are consistent with Grade I diastolic dysfunction (impaired relaxation). Elevated left atrial pressure.  Right Ventricle: The right ventricular size is normal. Right vetricular wall thickness  was not well visualized. Right ventricular systolic function is normal. There is normal pulmonary artery systolic pressure. The tricuspid regurgitant velocity is 2.67 m/s, and with an assumed right atrial pressure of 3 mmHg, the estimated right ventricular systolic pressure is 31.5 mmHg.  Left Atrium: Left atrial size was normal in size.  Right Atrium: Right atrial size was normal in size.  Pericardium: There is no evidence of pericardial effusion.  Mitral Valve: The mitral valve is normal in structure. There is mild thickening of the mitral valve leaflet(s). There is mild calcification of the mitral valve leaflet(s). Mild mitral annular calcification. No evidence of mitral valve regurgitation. No evidence of mitral valve stenosis. MV peak gradient, 7.5 mmHg. The mean mitral valve gradient is 2.0 mmHg.  Tricuspid Valve: The tricuspid valve is abnormal. Tricuspid valve regurgitation is mild . No evidence of tricuspid stenosis.  Aortic Valve: The aortic valve is tricuspid. There is moderate calcification of the aortic valve. There is moderate thickening of the aortic valve. There is moderate aortic valve annular calcification. Aortic valve regurgitation is mild. Aortic regurgitation PHT  measures 413 msec. No aortic stenosis is present. Aortic valve mean gradient measures 6.5 mmHg. Aortic valve peak gradient measures 12.6 mmHg. Aortic valve area, by VTI measures 1.53 cm.  Pulmonic Valve: The pulmonic valve was not well visualized. Pulmonic valve regurgitation is mild. No evidence of pulmonic stenosis.  Aorta: The aortic root and ascending aorta are structurally normal, with no evidence of dilitation.  Venous: The inferior vena cava is normal in size with greater than 50% respiratory variability, suggesting right atrial pressure of 3 mmHg.  IAS/Shunts: No atrial level shunt detected by color flow Doppler.   LEFT VENTRICLE PLAX 2D LVIDd:         4.20 cm      Diastology LVIDs:         3.30 cm      LV e' medial:    4.46 cm/s LV PW:         0.95 cm      LV E/e' medial:  19.7 LV IVS:        0.95 cm      LV e' lateral:   8.16 cm/s LVOT diam:     2.10 cm      LV E/e' lateral: 10.8 LV SV:         55 LV SV Index:   31 LVOT Area:     3.46 cm  LV Volumes (MOD) LV vol d, MOD A2C: 101.0 ml LV vol d, MOD A4C: 82.7 ml LV vol s, MOD A2C: 60.0 ml LV vol s, MOD A4C: 60.6 ml LV SV MOD A2C:     41.0 ml LV SV MOD A4C:     82.7 ml LV SV MOD BP:      31.0 ml  RIGHT VENTRICLE RV Basal diam:  3.60 cm RV Mid diam:    3.10 cm RV S prime:     9.79 cm/s TAPSE (M-mode): 1.6 cm  LEFT ATRIUM             Index        RIGHT ATRIUM           Index LA diam:        2.30 cm 1.31 cm/m   RA Area:     12.40 cm LA Vol (A2C):   44.8 ml 25.51 ml/m  RA Volume:   25.50 ml  14.52 ml/m LA Vol (A4C):   40.4 ml  23.00 ml/m LA Biplane Vol: 43.5 ml 24.77 ml/m AORTIC VALVE                     PULMONIC VALVE AV Area (Vmax):    1.70 cm      PR End Diast Vel: 7.84 msec AV Area (Vmean):   1.75 cm AV Area (VTI):     1.53 cm AV Vmax:           177.50 cm/s AV Vmean:          117.000 cm/s AV VTI:            0.361 m AV Peak Grad:      12.6 mmHg AV Mean Grad:      6.5 mmHg LVOT Vmax:         87.30  cm/s LVOT Vmean:        59.000 cm/s LVOT VTI:          0.159 m LVOT/AV VTI ratio: 0.44 AI PHT:            413 msec  AORTA Ao Root diam: 3.00 cm Ao Asc diam:  3.20 cm  MITRAL VALVE                TRICUSPID VALVE MV Area (PHT): 3.53 cm     TR Peak grad:   28.5 mmHg MV Peak grad:  7.5 mmHg     TR Vmax:        267.00 cm/s MV Mean grad:  2.0 mmHg MV Vmax:       1.37 m/s     SHUNTS MV Vmean:      61.1 cm/s    Systemic VTI:  0.16 m MV Decel Time: 215 msec     Systemic Diam: 2.10 cm MV E velocity: 87.80 cm/s MV A velocity: 136.00 cm/s MV E/A ratio:  0.65  Armida Lander MD Electronically signed by Armida Lander MD Signature Date/Time: 12/15/2023/3:24:52 PM    Final   TEE  ECHO INTRAOPERATIVE TEE 11/28/2019  Narrative *INTRAOPERATIVE TRANSESOPHAGEAL REPORT *    Patient Name:   ERRICK SALTS Date of Exam: 11/28/2019 Medical Rec #:  [id number]      Height:       67.0 in Accession #:    [id number]     Weight:       149.7 lb Date of Birth:  03-26-1941      BSA:          1.79 m Patient Age:    78 years       BP:           138/82 mmHg Patient Gender: M              HR:           77 bpm. Exam Location:  Inpatient  Transesophogeal exam was perform intraoperatively during surgical procedure. Patient was closely monitored under general anesthesia during the entirety of examination.  Indications:     I25.110 Atherosclerotic heart disease of native coronary artery with unstable angina pectoris Performing Phys: Raymondo Calin MD Diagnosing Phys: Raymondo Calin MD  Complications: No known complications during this procedure. POST-OP IMPRESSIONS - Left Ventricle: has mildly reduced systolic function, with an ejection fraction of 30%. - Aorta: The aorta appears unchanged from pre-bypass. - Left Atrial Appendage: The left atrial appendage appears unchanged from pre-bypass. - Aortic Valve: The aortic valve appears unchanged from pre-bypass. - Mitral Valve: The mitral valve appears  unchanged from  pre-bypass. - Tricuspid Valve: There is trace regurgitation. - Interatrial Septum: Leftward bowing of the intratrial septum suggesting increased right atrial pressure. - Pericardium: The pericardium appears unchanged from pre-bypass.  PRE-OP FINDINGS Left Ventricle: The left ventricle has mild-moderately reduced systolic function, with an ejection fraction of 40-45%. The cavity size was normal. There is no increase in left ventricular wall thickness.  Right Ventricle: The right ventricle has normal systolic function. The cavity was mildly enlarged. There is mild increase in right ventricular wall thickness.  Left Atrium: Left atrial size was normal in size. The left atrial appendage is well visualized and there is no evidence of thrombus present.  Right Atrium: Right atrial size was normal in size. A Chiari network is visualized.  Interatrial Septum: No atrial level shunt detected by color flow Doppler.  Pericardium: There is no evidence of pericardial effusion.  Mitral Valve: The mitral valve is normal in structure. Mitral valve regurgitation is mild by color flow Doppler. The MR jet is centrally-directed.  Tricuspid Valve: The tricuspid valve was normal in structure. Tricuspid valve regurgitation was not visualized by color flow Doppler.  Aortic Valve: The aortic valve is tricuspid. There is mild thickening and reduced mobility of the non-coronary cusp of the aortic valve. Aortic valve regurgitation was not visualized by color flow Doppler. There is no evidence of aortic valve stenosis.  Pulmonic Valve: The pulmonic valve was normal in structure. Pulmonic valve regurgitation is trivial by color flow Doppler.   +--------------+-------++ LEFT VENTRICLE        +--------------+-------++ PLAX 2D               +--------------+-------++ LVIDd:        4.46 cm +--------------+-------++ LVIDs:        3.86 cm +--------------+-------++ LV SV:        26 ml    +--------------+-------++ LV SV Index:  14.59   +--------------+-------++                       +--------------+-------++  +-------------+-----------++ AORTIC VALVE             +-------------+-----------++ AV Vmax:     124.00 cm/s +-------------+-----------++ AV Vmean:    93.200 cm/s +-------------+-----------++ AV VTI:      0.301 m     +-------------+-----------++ AV Peak Grad:6.2 mmHg    +-------------+-----------++ AV Mean Grad:4.0 mmHg    +-------------+-----------++   Raymondo Calin MD Electronically signed by Raymondo Calin MD Signature Date/Time: 11/28/2019/5:29:15 PM    Final        ______________________________________________________________________________________________      Risk Assessment/Calculations  CHA2DS2-VASc Score = 8   This indicates a 10.8% annual risk of stroke. The patient's score is based upon: CHF History: 1 HTN History: 1 Diabetes History: 1 Stroke History: 2 Vascular Disease History: 1 Age Score: 2 Gender Score: 0           Physical Exam VS:  BP (!) 152/84   Pulse 88   Ht 5' 7 (1.702 m)   Wt 145 lb 12.8 oz (66.1 kg)   SpO2 96%   BMI 22.84 kg/m    Wt Readings from Last 3 Encounters:  01/01/24 145 lb 12.8 oz (66.1 kg)  12/19/23 147 lb (66.7 kg)  12/14/23 140 lb (63.5 kg)    GEN: Well nourished, well developed in no acute distress NECK: No JVD; No carotid bruits CARDIAC: RRR, no murmurs, rubs, gallops RESPIRATORY:  Clear to auscultation without rales, wheezing or rhonchi  ABDOMEN: Soft, non-tender, non-distended EXTREMITIES:  No edema; No deformity   ASSESSMENT AND PLAN  CAD s/p CABG: Denies any recent chest pain  Ischemic cardiomyopathy: Baseline EF 45 to 50%.  Stable on recent echocardiogram.  No evidence of acute heart failure  History of postop atrial fibrillation: No recurrence.  Patient is not on anticoagulation therapy.  Unfortunately he was admitted with acute CVA  recently, he is wearing a heart monitor to assess for recurrence of A-fib.  Hypertension: Blood pressure elevated, will increase Entresto  to 49-51 mg twice a day  Hyperlipidemia: He has statin with tolerance, given history of CAD and recent CVA, we will refer the patient to clinical pharmacist to consider inclisiran.  Recent CVA: Currently on aspirin  and Plavix .  Patient is wearing a heart monitor to assess for recurrence of A-fib.  If he has any recurrence in the future such as TIA or CVA, I think we should proceed with a loop recorder  Anterior mediastinal mass: Upcoming PET CT prior to follow-up with CT surgery.        Dispo: Follow-up in 6 weeks to review heart monitor result.  Signed, Ervin Heath, PA

## 2024-01-02 ENCOUNTER — Ambulatory Visit: Admitting: Thoracic Surgery (Cardiothoracic Vascular Surgery)

## 2024-01-08 ENCOUNTER — Other Ambulatory Visit: Payer: Self-pay | Admitting: Internal Medicine

## 2024-01-08 DIAGNOSIS — I5022 Chronic systolic (congestive) heart failure: Secondary | ICD-10-CM

## 2024-01-08 DIAGNOSIS — E119 Type 2 diabetes mellitus without complications: Secondary | ICD-10-CM

## 2024-01-09 DIAGNOSIS — Z8673 Personal history of transient ischemic attack (TIA), and cerebral infarction without residual deficits: Secondary | ICD-10-CM | POA: Diagnosis not present

## 2024-01-10 ENCOUNTER — Encounter: Payer: Self-pay | Admitting: Podiatry

## 2024-01-10 ENCOUNTER — Ambulatory Visit: Admitting: Podiatry

## 2024-01-10 DIAGNOSIS — Q828 Other specified congenital malformations of skin: Secondary | ICD-10-CM

## 2024-01-10 DIAGNOSIS — B351 Tinea unguium: Secondary | ICD-10-CM | POA: Diagnosis not present

## 2024-01-10 DIAGNOSIS — M79676 Pain in unspecified toe(s): Secondary | ICD-10-CM | POA: Diagnosis not present

## 2024-01-10 DIAGNOSIS — E1142 Type 2 diabetes mellitus with diabetic polyneuropathy: Secondary | ICD-10-CM

## 2024-01-11 DIAGNOSIS — Z8673 Personal history of transient ischemic attack (TIA), and cerebral infarction without residual deficits: Secondary | ICD-10-CM | POA: Diagnosis not present

## 2024-01-13 NOTE — Progress Notes (Signed)
 Subjective:  Patient ID: James Braun, male    DOB: [birth year],  MRN: [id number]  James Braun presents to clinic today for at risk foot care with history of diabetic neuropathy and painful porokeratotic lesions b/l lower extremities left >right submet head 5 b/l. Pain prevent(s) comfortable ambulation. Aggravating factor is weightbearing with and without shoegear.  Chief Complaint  Patient presents with   Nail Problem    Thick painful toenails, 3 month follow up   Callouses    Painful callus lesion left 5th submet   New problem(s): None.   PCP is Jimmy Charlie FERNS, MD.  Allergies  Allergen Reactions   Lipitor [Atorvastatin] Other (See Comments)    myalgias   Lisinopril    Morphine And Codeine Other (See Comments)     hypotension   Levofloxacin Rash   Septra [Sulfamethoxazole-Trimethoprim] Rash   Sulfamethoxazole Rash   Sulfamethoxazole-Trimethoprim Rash    Review of Systems: Negative except as noted in the HPI.  Objective: No changes noted in today's physical examination. There were no vitals filed for this visit. James Braun is a pleasant 83 y.o. male WD, WN in NAD. AAO x 3.  Vascular Examination: Capillary refill time immediate b/l. Palpable pedal pulses. Pedal hair present b/l. No pain with calf compression b/l. Skin temperature gradient WNL b/l. No cyanosis or clubbing b/l. No ischemia or gangrene noted b/l. No edema noted b/l LE.  Neurological Examination: Sensation grossly intact b/l with 10 gram monofilament. Vibratory sensation intact b/l. Pt has subjective symptoms of neuropathy.  Dermatological Examination: Pedal skin with normal turgor, texture and tone b/l.  No open wounds. No interdigital macerations.   Toenails 1-5 b/l thick, discolored, elongated with subungual debris and pain on dorsal palpation.   Porokeratotic lesion(s) submet head 5 b/l. No erythema, no edema, no drainage, no fluctuance.  Musculoskeletal Examination: Muscle strength 5/5 to  all lower extremity muscle groups bilaterally. Plantarflexed metatarsal(s) 5th metatarsal head b/l lower extremities. HAV with bunion bilaterally and hammertoes 2-5 b/l.  Radiographs: None  Last A1c:      Latest Ref Rng & Units 12/15/2023    2:49 AM 08/06/2023   10:40 AM 01/24/2023    7:49 AM  Hemoglobin A1C  Hemoglobin-A1c 4.8 - 5.6 % 7.6  7.4  7.5    Assessment/Plan: 1. Pain due to onychomycosis of toenail   2. Porokeratosis   3. Diabetic polyneuropathy associated with type 2 diabetes mellitus [facility])   Consent given for treatment. Patient examined. All patient's and/or POA's questions/concerns addressed on today's visit. Mycotic toenails 1-5 debrided in length and girth without incident. Porokeratotic lesion(s) submet head 5 b/l pared and enucleated with sharp debridement without incident.Continue daily foot inspections and monitor blood glucose per PCP/Endocrinologist's recommendations. Continue soft, supportive shoe gear daily. Report any pedal injuries to medical professional. Call office if there are any quesitons/concerns. Return in about 9 weeks (around 03/13/2024).  James Braun, DPM      [place] LOCATION: [address] [place] [address]                   Office [phone]   [facility] LOCATION: [address] [place], [place] [address] Office [phone]

## 2024-01-14 ENCOUNTER — Telehealth: Payer: Self-pay | Admitting: Internal Medicine

## 2024-01-14 MED ORDER — APIXABAN 5 MG PO TABS: 5.0000 mg | ORAL_TABLET | Freq: Two times a day (BID) | ORAL | 3 refills | Status: DC

## 2024-01-14 NOTE — Telephone Encounter (Signed)
 Zio monitor results in and did have ~1% atrial fibrillation.  Formal EP recommendation for DOAC.  Discussed with him. He thinks he was on eliquis  in the past and may have had some reaction to it--but not sure. Discussed xarelto--but will try eliquis  5mg  bid for now. Rx sent Stop the plavix .  Will review at OV coming up in a couple of weeks

## 2024-01-15 ENCOUNTER — Encounter (HOSPITAL_COMMUNITY)
Admission: RE | Admit: 2024-01-15 | Discharge: 2024-01-15 | Disposition: A | Discharge status: Home / Self Care | Source: Ambulatory Visit | Attending: Thoracic Surgery (Cardiothoracic Vascular Surgery) | Admitting: Thoracic Surgery (Cardiothoracic Vascular Surgery)

## 2024-01-15 DIAGNOSIS — K449 Diaphragmatic hernia without obstruction or gangrene: Secondary | ICD-10-CM | POA: Diagnosis not present

## 2024-01-15 DIAGNOSIS — K5730 Diverticulosis of large intestine without perforation or abscess without bleeding: Secondary | ICD-10-CM | POA: Diagnosis not present

## 2024-01-15 DIAGNOSIS — J9859 Other diseases of mediastinum, not elsewhere classified: Secondary | ICD-10-CM | POA: Diagnosis not present

## 2024-01-15 DIAGNOSIS — N200 Calculus of kidney: Secondary | ICD-10-CM | POA: Diagnosis not present

## 2024-01-15 DIAGNOSIS — R918 Other nonspecific abnormal finding of lung field: Secondary | ICD-10-CM | POA: Diagnosis not present

## 2024-01-15 LAB — GLUCOSE, CAPILLARY: Glucose-Capillary: 106 mg/dL — ABNORMAL HIGH (ref 70–99)

## 2024-01-15 MED ORDER — FLUDEOXYGLUCOSE F - 18 (FDG) INJECTION
7.5500 | Freq: Once | INTRAVENOUS | Status: AC
  Administered 2024-01-15: 7.55 via INTRAVENOUS

## 2024-01-22 ENCOUNTER — Ambulatory Visit
Attending: Thoracic Surgery (Cardiothoracic Vascular Surgery) | Admitting: Thoracic Surgery (Cardiothoracic Vascular Surgery)

## 2024-01-22 VITALS — BP 170/90 | HR 90 | Resp 20 | Ht 67.0 in | Wt 150.0 lb

## 2024-01-22 DIAGNOSIS — J9859 Other diseases of mediastinum, not elsewhere classified: Secondary | ICD-10-CM

## 2024-01-22 NOTE — Progress Notes (Signed)
 [address] [place] [address]             [phone]    HPI: James Braun returns to discuss his anterior mediastinal mass.  James Braun is an 83 year old man with a past history of hypertension, hyperlipidemia, diabetes, CAD, CABG in 2021, ischemic cardiomyopathy, paroxysmal atrial fibrillation, recent stroke, melanoma, and a newly discovered anterior mediastinal mass.  He presented in May with right leg weakness.  Workup revealed a stroke.  As part of his workup he had a CT angio of the neck which showed a 5 cm anterior mediastinal mass.  He then had a CT of the chest which showed a 5 cm mass with peripheral enhancement.  The radiologist raised concern for possible pseudoaneurysm, although the most likely diagnosis was neoplasm.  An MRI was done with and without contrast and there was no evidence of flow into the lesion at all.  He then had a PET/CT which showed peripherally enhancing mass with central necrosis.  He has not had any further problems with right leg weakness since his then back in May.  Denies any chest pain, pressure, tightness, or shortness of breath.  Feels a bit overwhelmed with all of the medical issues recently.   Past Medical History:  Diagnosis Date   BPH (benign prostatic hypertrophy)    Cancer (HCC)    melanoma   Coronary artery disease    History of kidney stones    History of melanoma excision OCT 2010   Hyperlipidemia    Hypertension    Left ureteral calculus    Nephrolithiasis    bilateral   Type 2 diabetes mellitus (HCC)     Current Outpatient Medications  Medication Sig Dispense Refill   Acetaminophen  (TYLENOL  8 HOUR ARTHRITIS PAIN PO) Take by mouth.     alum hydroxide-mag trisilicate (GAVISCON ) 80-20 MG CHEW chewable tablet Chew 2 tablets by mouth 3 (three) times daily as needed for indigestion or heartburn.     apixaban  (ELIQUIS ) 5 MG TABS tablet Take 1 tablet (5 mg total) by mouth 2 (two) times daily. 60 tablet 3    empagliflozin  (JARDIANCE ) 10 MG TABS tablet Take 1 tablet (10 mg total) by mouth daily before breakfast. 90 tablet 3   ezetimibe  (ZETIA ) 10 MG tablet Take 1 tablet (10 mg total) by mouth daily. 30 tablet 1   ferrous sulfate 325 (65 FE) MG tablet Take 325 mg by mouth daily with breakfast.     finasteride  (PROSCAR ) 5 MG tablet Take 1 tablet (5 mg total) by mouth daily. 90 tablet 3   LUMIGAN 0.01 % SOLN Place 1 drop into the left eye daily.     metFORMIN  (GLUCOPHAGE -XR) 500 MG 24 hr tablet Take 1 tablet (500 mg total) by mouth 2 (two) times daily. 180 tablet 1   Multiple Vitamin (MULTIVITAMIN) tablet Take 1 tablet by mouth daily.     Nutritional Supplements (NUTRITIONAL SUPPLEMENT PO) Take by mouth. Curamin     OVER THE COUNTER MEDICATION HA Joint Formula: Vit C, Magnesium , Collagen, Quercetin, Olive Extract, Boswellia     OVER THE COUNTER MEDICATION Joint Supplement     Potassium Citrate  15 MEQ (1620 MG) TBCR Take 1 tablet by mouth 3 (three) times daily. 270 tablet 3   sacubitril -valsartan  (ENTRESTO ) 49-51 MG Take 1 tablet by mouth 2 (two) times daily. 180 tablet 3   sildenafil  (REVATIO ) 20 MG tablet TAKE 3-5 TABLETS BY MOUTH EVERY DAY AS NEEDED 50  tablet 5   SIMBRINZA 1-0.2 % SUSP Place 1 drop into the left eye 2 (two) times daily.     timolol  (TIMOPTIC ) 0.5 % ophthalmic solution Place 1 drop into both eyes 2 (two) times daily.      Turmeric, Curcuma Longa, (CURCUMIN) POWD by Does not apply route.     Vitamin D3 (VITAMIN D ) 25 MCG tablet Take 1 tablet by mouth 2 (two) times daily.     Cyanocobalamin  (VITAMIN B-12) 1000 MCG SUBL Place 1,000 mcg under the tongue. 1000 mcg SL daily (Patient not taking: Reported on 01/22/2024)     pantoprazole  (PROTONIX ) 40 MG tablet Take 1 tablet (40 mg total) by mouth daily. (Patient not taking: Reported on 01/22/2024) 90 tablet 3   No current facility-administered medications for this visit.    Physical Exam BP (!) 170/90   Pulse 90   Resp 20   Ht 5' 7 (1.702  m)   Wt 150 lb (68 kg)   SpO2 95% Comment: RA  BMI 23.2 kg/m  83 year old man in no acute distress Alert and oriented x 3 with no focal motor deficit No cervical or supraclavicular adenopathy Cardiac regular rate and rhythm Lungs clear bilaterally  Diagnostic Tests: NUCLEAR MEDICINE PET SKULL BASE TO THIGH   TECHNIQUE: 7.55 mCi F-18 FDG was injected intravenously. Full-ring PET imaging was performed from the skull base to thigh after the radiotracer. CT data was obtained and used for attenuation correction and anatomic localization.   Fasting blood glucose: 106 mg/dl   COMPARISON:  MRI 93/98/7974.  Chest CT 12/15/2023.   FINDINGS: Mediastinal blood pool activity: SUV max 2.3   Liver activity: SUV max 3.1   NECK: No specific abnormal uptake identified in the neck including along lymph node chains of the posterior triangle, internal jugular or submandibular regions. There is symmetric uptake of the intracranial compartment.   Incidental CT findings: Paranasal sinuses and mastoid air cells are grossly clear. Streak artifact related to the patient's dental hardware. The parotid glands, submandibular glands and thyroid  glands unremarkable.   CHEST: As on the prior CT scan is a complex cystic lesion along the anterior mediastinum immediately abutting the anterior aspect of the ascending aorta proximal to the arch, just above the surgical material in this location. This also causes slight mass effect along the right side of the main pulmonary artery. Patient is status post median sternotomy. This area previously measured 5.9 x 4.3 cm and today on CT image 67 measures 5.6 by 4.4 cm. The wall of this lesion does have some thickening and soft tissue irregularity. On PET images there is some uptake along the wall with maximum SUV value of up to 5.4.   Otherwise no specific abnormal uptake above blood pool in the axillary region, hilum or mediastinum. No abnormal lung  uptake.   Incidental CT findings: Large hiatal hernia. Patulous esophagus. Scattered vascular calcifications. Heart is nonenlarged. No pericardial effusion. Status post median sternotomy. Bypass grafts. Calcifications along the aortic valve. Breathing motion. No consolidation, pneumothorax or effusion. Some dependent atelectasis.   ABDOMEN/PELVIS: There is physiologic distribution radiotracer along the parenchymal organs, bowel and renal collecting systems. No abnormal nodal uptake identified.   Incidental CT findings: Staghorn like appearing stone in the upper pole of the right kidney with some parenchymal atrophy. Previous cholecystectomy. Grossly the liver, spleen, adrenal glands are unremarkable on this limited examination. Mild pancreatic atrophy. Punctate lower pole left-sided renal stone. No ureteral stones. Underdistended urinary bladder. Large bowel has a normal  course and caliber with scattered stool. Normal appendix. Left-sided colonic diverticula. Small bowel is nondilated. Small bilateral fat containing inguinal hernias. No free air or free fluid. Note is made of left-sided scrotal hydrocele. Please correlate with clinical history. Further ultrasound as clinically appropriate.   SKELETON: No abnormal uptake along the visualized osseous structures.   Incidental CT findings: Curvature of spine. Diffuse degenerative changes.   IMPRESSION: Stable appearance to the complex cystic lesion along the anterior superior mediastinum. Once again this abuts the ascending aorta anteriorly and the pulmonary artery. As seen on CT the wall lesion is slightly thickened and nodular. There is some mild to moderate uptake along the nodular wall with maximum SUV value of 5.4. Again as previously described there is a differential. Patient is status post median sternotomy. This very well could be a complex fluid collection such as evolving hematoma, seroma. A more aggressive lesion still  remains in the differential. With the stability could consider a surveillance CT scan in 3 months at this time. More aggressive approach could be considered as clinically directed.   No other areas of uptake identified.   Hiatal hernia.  Colonic diverticula.  Staghorn right renal stone.     Electronically Signed   By: Ranell Bring M.D.   On: 01/17/2024 11:40  I personally reviewed the CT, MR, and PET/CT images.  Also discussed the by telephone with Dr. Marcey Moan of interventional radiology.  We are comfortable ruling out the possibility of pseudoaneurysm.  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  Impression: James Braun is an 83 year old man with a past history of hypertension, hyperlipidemia, diabetes, CAD, CABG in 2021, ischemic cardiomyopathy, paroxysmal atrial fibrillation, recent stroke, melanoma, and a newly discovered anterior mediastinal mass.  Anterior mediastinal mass-large mass abutting the aorta and pulmonary artery.  In close proximity to the vein graft proximals from his CABG.  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  The mass had been present for years ago at the time of CABG it would have been resected.  There was no mention of any abnormality in the anterior mediastinum in his operative note.  Thymoma is a possibility but more likely thymic carcinoma.  I discussed the seriousness of these findings with James Braun.  I recommended that we biopsy the mass in order to guide treatment.  I do not think this mass would be resectable but it could be treated with chemo and/or radiation.  He is adamant that he does not want to pursue diagnostic workup at this time.  He would not be interested in surgical resection, although I do not think this would be resectable anyway given its location.  He is also not interested in any other aggressive form of therapy, such as radiation or chemotherapy.     He is of sound mind and capable of making decisions.  He repeatedly stated that he does not expect to live much longer and does not want to go through any procedures.  I did emphasize that although he is asymptomatic now that will not remain the case.  Unfortunately by the time symptoms occur it usually too late to do much of anything about it.  He expressed understanding of that, but stands by his decision.  Plan: James Braun will contact me if he wishes to further pursue the anterior mediastinal mass.  Elspeth JAYSON Millers, MD [facility] [phone]

## 2024-01-29 ENCOUNTER — Other Ambulatory Visit: Payer: PPO

## 2024-02-05 ENCOUNTER — Ambulatory Visit (INDEPENDENT_AMBULATORY_CARE_PROVIDER_SITE_OTHER): Payer: PPO | Admitting: Internal Medicine

## 2024-02-05 ENCOUNTER — Encounter: Payer: Self-pay | Admitting: Internal Medicine

## 2024-02-05 VITALS — BP 120/84 | HR 83 | Temp 97.9°F | Ht 66.5 in | Wt 146.0 lb

## 2024-02-05 DIAGNOSIS — D6869 Other thrombophilia: Secondary | ICD-10-CM | POA: Diagnosis not present

## 2024-02-05 DIAGNOSIS — J9859 Other diseases of mediastinum, not elsewhere classified: Secondary | ICD-10-CM | POA: Diagnosis not present

## 2024-02-05 DIAGNOSIS — I5020 Unspecified systolic (congestive) heart failure: Secondary | ICD-10-CM | POA: Diagnosis not present

## 2024-02-05 DIAGNOSIS — K219 Gastro-esophageal reflux disease without esophagitis: Secondary | ICD-10-CM | POA: Diagnosis not present

## 2024-02-05 DIAGNOSIS — Z0000 Encounter for general adult medical examination without abnormal findings: Secondary | ICD-10-CM

## 2024-02-05 DIAGNOSIS — I480 Paroxysmal atrial fibrillation: Secondary | ICD-10-CM | POA: Diagnosis not present

## 2024-02-05 DIAGNOSIS — Z8673 Personal history of transient ischemic attack (TIA), and cerebral infarction without residual deficits: Secondary | ICD-10-CM

## 2024-02-05 DIAGNOSIS — E1121 Type 2 diabetes mellitus with diabetic nephropathy: Secondary | ICD-10-CM

## 2024-02-05 LAB — HM DIABETES FOOT EXAM

## 2024-02-05 NOTE — Assessment & Plan Note (Signed)
 Compensated on the entresto 

## 2024-02-05 NOTE — Assessment & Plan Note (Signed)
 No residual May have been from atrial fib Will be starting repatha

## 2024-02-05 NOTE — Progress Notes (Signed)
 Please advise if accepting patient transfer from Dr Letvak

## 2024-02-05 NOTE — Progress Notes (Signed)
 Hearing Screening - Comments:: Has hearing aids. Wearing them today Vision Screening - Comments:: June 2025

## 2024-02-05 NOTE — Assessment & Plan Note (Signed)
 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

## 2024-02-05 NOTE — Assessment & Plan Note (Signed)
 Ecchymotic areas mostly on arms Related to the eliquis 

## 2024-02-05 NOTE — Assessment & Plan Note (Signed)
 Lab Results  Component Value Date   HGBA1C 7.6 (H) 12/15/2023   Controlled adequately on metformin  500 bid and jardiance  10mg  daily

## 2024-02-05 NOTE — Progress Notes (Signed)
 Subjective:    Patient ID: James Braun, male    DOB: 06/09/1941, 83 y.o.   MRN: [id number]  HPI Here for Medicare wellness visit and follow up of chronic health conditions Reviewed advanced directives Reviewed other doctors---Dr Hendrickson--CT surgery, Dr Medford, Dr Skains--cardiology, Dr Gleda, Dr Darrel, Dr Shona --derm Only hospitalization was in May--CVA. No surgery Tries to walk 20 minutes daily No alcohol or tobacco Some fuzzy vision on right after cataract surgery--so reluctant to do the left Hearing aides help No falls No depression or anhedonia Independent with instrumental ADLs No sig memory issues  No residual symptoms from stroke Now on eliquis  5 bid---due to atrial fib on monitor No palpitations No chest pain or SOB No edema Sleeps flat in bed--no PND  Did see Dr Hendrickson---thinks he told him that biopsy would be dangerous That is not what his note reflected He does confirm that he would not want RT or chemo even if he has cancer  Went to lipid clinic I recommend that he start the repatha  Back on omeprazole  now that he is off plavix  This controls the reflux No dysphagia  Not checking sugars  Current Outpatient Medications on File Prior to Visit  Medication Sig Dispense Refill   Acetaminophen  (TYLENOL  8 HOUR ARTHRITIS PAIN PO) Take by mouth.     alum hydroxide-mag trisilicate (GAVISCON ) 80-20 MG CHEW chewable tablet Chew 2 tablets by mouth 3 (three) times daily as needed for indigestion or heartburn.     apixaban  (ELIQUIS ) 5 MG TABS tablet Take 1 tablet (5 mg total) by mouth 2 (two) times daily. 60 tablet 3   Cyanocobalamin  (VITAMIN B-12) 1000 MCG SUBL Place 1,000 mcg under the tongue. 1000 mcg SL daily     empagliflozin  (JARDIANCE ) 10 MG TABS tablet Take 1 tablet (10 mg total) by mouth daily before breakfast. 90 tablet 3   ezetimibe  (ZETIA ) 10 MG tablet Take 1 tablet (10 mg total) by mouth daily. 30 tablet 1   ferrous  sulfate 325 (65 FE) MG tablet Take 325 mg by mouth daily with breakfast.     finasteride  (PROSCAR ) 5 MG tablet Take 1 tablet (5 mg total) by mouth daily. 90 tablet 3   LUMIGAN 0.01 % SOLN Place 1 drop into the left eye daily.     metFORMIN  (GLUCOPHAGE -XR) 500 MG 24 hr tablet Take 1 tablet (500 mg total) by mouth 2 (two) times daily. 180 tablet 1   Multiple Vitamin (MULTIVITAMIN) tablet Take 1 tablet by mouth daily.     Nutritional Supplements (NUTRITIONAL SUPPLEMENT PO) Take by mouth. Curamin     omeprazole  (PRILOSEC) 10 MG capsule Take 10 mg by mouth daily.     OVER THE COUNTER MEDICATION HA Joint Formula: Vit C, Magnesium , Collagen, Quercetin, Olive Extract, Boswellia     OVER THE COUNTER MEDICATION Joint Supplement     Potassium Citrate  15 MEQ (1620 MG) TBCR Take 1 tablet by mouth 3 (three) times daily. 270 tablet 3   sacubitril -valsartan  (ENTRESTO ) 49-51 MG Take 1 tablet by mouth 2 (two) times daily. 180 tablet 3   sildenafil  (REVATIO ) 20 MG tablet TAKE 3-5 TABLETS BY MOUTH EVERY DAY AS NEEDED 50 tablet 5   SIMBRINZA 1-0.2 % SUSP Place 1 drop into the left eye 2 (two) times daily.     timolol  (TIMOPTIC ) 0.5 % ophthalmic solution Place 1 drop into both eyes 2 (two) times daily.      Turmeric, Curcuma Longa, (CURCUMIN) POWD by Does not apply route.  Vitamin D3 (VITAMIN D ) 25 MCG tablet Take 1 tablet by mouth 2 (two) times daily.     No current facility-administered medications on file prior to visit.    Allergies  Allergen Reactions   Lipitor [Atorvastatin] Other (See Comments)    myalgias   Lisinopril    Morphine And Codeine Other (See Comments)     hypotension   Levofloxacin Rash   Septra [Sulfamethoxazole-Trimethoprim] Rash   Sulfamethoxazole Rash   Sulfamethoxazole-Trimethoprim Rash    Past Medical History:  Diagnosis Date   BPH (benign prostatic hypertrophy)    Cancer (HCC)    melanoma   Coronary artery disease    History of kidney stones    History of melanoma  excision OCT 2010   Hyperlipidemia    Hypertension    Left ureteral calculus    Nephrolithiasis    bilateral   Type 2 diabetes mellitus (HCC)     Past Surgical History:  Procedure Laterality Date   CARDIAC CATHETERIZATION     CHOLECYSTECTOMY  01/1999   CORNEAL TRANSPLANT Right 2023   CORONARY ARTERY BYPASS GRAFT N/A 11/28/2019   Procedure: CORONARY ARTERY BYPASS GRAFTING (CABG) x3, using left internal mammary artery and Saphenous vein harvested endoscopically;  Surgeon: Fleeta Hanford Coy, MD;  Location: [facility] OR;  Service: Open Heart Surgery;  Laterality: N/A;   CYSTOSCOPY WITH STENT PLACEMENT Left 11/14/2012   Procedure: CYSTOSCOPY WITH STENT PLACEMENT;  Surgeon: Garnette Shack, MD;  Location: [facility];  Service: Urology;  Laterality: Left;   CYSTOSCOPY/RETROGRADE/URETEROSCOPY/STONE EXTRACTION WITH BASKET Left 11/14/2012   Procedure: CYSTOSCOPY/RETROGRADE/URETEROSCOPY/STONE EXTRACTION WITH BASKET;  Surgeon: Garnette Shack, MD;  Location[facility];  Service: Urology;  Laterality: Left;   EXTRACORPOREAL SHOCK WAVE LITHOTRIPSY  01/1999   X2   HOLMIUM LASER APPLICATION Left 11/14/2012   Procedure: HOLMIUM LASER APPLICATION;  Surgeon: Garnette Shack, MD;  Location: [facility];  Service: Urology;  Laterality: Left;   LEFT HEART CATH AND CORONARY ANGIOGRAPHY N/A 11/27/2019   Procedure: LEFT HEART CATH AND CORONARY ANGIOGRAPHY;  Surgeon: Court Dorn PARAS, MD;  Location: [facility] INVASIVE [facility] LAB;  Service: Cardiovascular;  Laterality: N/A;   LEFT URETEROSCOPIC STONE EXTRACTION  02/22/2007   NEPHROLITHOTOMY Right 09/28/2010   percutaneous   ORIF CLAVICLE FRACTURE  2006   PROSTATE BIOPSY  1998   neg   SHOULDER ARTHROSCOPY  07/2013   Dr Rubie   TEE WITHOUT CARDIOVERSION N/A 11/28/2019   Procedure: TRANSESOPHAGEAL ECHOCARDIOGRAM (TEE);  Surgeon: Fleeta Hanford, Coy, MD;  Location: [facility] OR;  Service: Open Heart Surgery;  Laterality: N/A;    TONSILLECTOMY      Family History  Problem Relation Age of Onset   Hypertension Mother    Diabetes Father    Hypertension Father    Colon polyps Brother    Coronary artery disease Neg Hx    Cancer Neg Hx    Esophageal cancer Neg Hx    Stomach cancer Neg Hx    Rectal cancer Neg Hx     Social History   Socioeconomic History   Marital status: Married    Spouse name: Not on file   Number of children: 2   Years of education: Not on file   Highest education level: Master's degree (e.g., MA, MS, MEng, MEd, MSW, MBA)  Occupational History   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   Occupation: Buys very distressed houses and rehabilitates   Occupation: Does mediation  Tobacco Use   Smoking status: Never  Passive exposure: Never   Smokeless tobacco: Never  Substance and Sexual Activity   Alcohol use: No   Drug use: No   Sexual activity: Not on file  Other Topics Concern   Not on file  Social History Narrative   Has living will   Wife is health care POA---alternate is committee of physicians   Would accept resuscitation---wouldn't accept prolonged ventilation   No tube feeds if cognitively unaware   Right handed   Social Drivers of Health   Financial Resource Strain: Low Risk  (02/01/2024)   Overall Financial Resource Strain (CARDIA)    Difficulty of Paying Living Expenses: Not hard at all  Food Insecurity: No Food Insecurity (02/01/2024)   Hunger Vital Sign    Worried About Running Out of Food in the Last Year: Never true    Ran Out of Food in the Last Year: Never true  Transportation Needs: No Transportation Needs (02/01/2024)   PRAPARE - Administrator, Civil Service (Medical): No    Lack of Transportation (Non-Medical): No  Physical Activity: Insufficiently Active (02/01/2024)   Exercise Vital Sign    Days of Exercise per Week: 4 days    Minutes of Exercise per Session: 20 min  Stress: No Stress Concern Present (02/01/2024)   [employer] of  Occupational Health - Occupational Stress Questionnaire    Feeling of Stress: Not at all  Social Connections: Socially Integrated (02/01/2024)   Social Connection and Isolation Panel    Frequency of Communication with Friends and Family: More than three times a week    Frequency of Social Gatherings with Friends and Family: Twice a week    Attends Religious Services: More than 4 times per year    Active Member of [employer] or Organizations: Yes    Attends Engineer, structural: More than 4 times per year    Marital Status: Married  Catering manager Violence: Not At Risk (12/18/2023)   Humiliation, Afraid, Rape, and Kick questionnaire    Fear of Current or Ex-Partner: No    Emotionally Abused: No    Physically Abused: No    Sexually Abused: No   Review of Systems Appetite is okay Weight is stable Sleeps well Wears seat belt Teeth are okay---keeps up with dentist Some sinus drainage--occ coughs up phlegm No sig back or joint pains---mild symptoms helped by using OTC joint meds and tylenol  No suspicious skin lesions--does see derm Bowels move okay--with fiber Voids okay---increased frequency with jardiance        Objective:   Physical Exam Constitutional:      Appearance: Normal appearance.  HENT:     Mouth/Throat:     Pharynx: No oropharyngeal exudate or posterior oropharyngeal erythema.  Eyes:     Conjunctiva/sclera: Conjunctivae normal.     Pupils: Pupils are equal, round, and reactive to light.  Cardiovascular:     Rate and Rhythm: Normal rate and regular rhythm.     Heart sounds: No murmur heard.    No gallop.     Comments: Faint pedal pulses Pulmonary:     Effort: Pulmonary effort is normal.     Breath sounds: Normal breath sounds. No wheezing or rales.  Abdominal:     Palpations: Abdomen is soft.     Tenderness: There is no abdominal tenderness.  Musculoskeletal:     Cervical back: Neck supple.     Right lower leg: No edema.     Left lower leg: No edema.   Lymphadenopathy:  Cervical: No cervical adenopathy.  Skin:    Findings: No lesion or rash.     Comments: No foot lesions Scattered ecchymoses--mostly forearms  Neurological:     General: No focal deficit present.     Mental Status: He is alert and oriented to person, place, and time.     Comments: Mini-cog--normal Normal sensation in feet  Psychiatric:        Mood and Affect: Mood normal.        Behavior: Behavior normal.            Assessment & Plan:

## 2024-02-05 NOTE — Progress Notes (Signed)
 Patient would like to transfer care to Dr KANDICE from Dr Jimmy please advise if accepting this patient

## 2024-02-05 NOTE — Assessment & Plan Note (Signed)
 No symptoms On eliquis  5 bid now

## 2024-02-05 NOTE — Assessment & Plan Note (Signed)
 Back on omeprazole --discussed weaning to find the lowest effective dose

## 2024-02-05 NOTE — Assessment & Plan Note (Signed)
 I have personally reviewed the Medicare Annual Wellness questionnaire and have noted 1. The patient's medical and social history 2. Their use of alcohol, tobacco or illicit drugs 3. Their current medications and supplements 4. The patient's functional ability including ADL's, fall risks, home safety risks and hearing or visual             impairment. 5. Diet and physical activities 6. Evidence for depression or mood disorders  The patients weight, height, BMI and visual acuity have been recorded in the chart I have made referrals, counseling and provided education to the patient based review of the above and I have provided the pt with a written personalized care plan for preventive services.  I have provided you with a copy of your personalized plan for preventive services. Please take the time to review along with your updated medication list.  Tries to walk daily No cancer screening due to age Flu vaccine in the fall--he has not been getting COVID vaccines

## 2024-02-12 NOTE — Progress Notes (Unsigned)
 Patient ID: James Braun                 DOB: [birth year]                    MRN: [id number]      HPI: James Braun is a 83 y.o. male patient referred to lipid clinic by Janene Boer, PA. PMH is significant for PAF, HF, HTN, CAD,s/p CABG X3, hx of CVA, T2DM, GERD, statin induced myopathy, CKD  The patient presented to the lipid clinic reporting muscle weakness with both Lipitor and Crestor , noting that Lipitor caused more symptoms. He has never tried low doses of either statin. The patient follows a healthy diet and rarely eats out. We reviewed various options for lowering LDL cholesterol, including low-dose statins, PCSK9 inhibitors, bempedoic acid, and inclisiran, discussing their mechanisms of action, dosing, side effects, expected LDL reductions, and cost considerations including patient assistance programs. Additionally, lifestyle interventions to reduce triglycerides were discussed. The patient expressed interest in focusing on dietary improvements before considering further medication. Current Medications: Zetia  10 mg daily  Intolerances: rosuvastatin  10 mg daily  Risk Factors: HTN, CAD,s/p CABG X3, hx of CVA, T2DM, statin induced myopathy, CKD LDL goal: <55 mg/dl  Last lab : LDL 875, HDL 33, TG 215, TC 200 11/2023 (unsure he fasted for that lab or not)  Diet: healthy home cooked meals - but there is room for improvement    Exercise: 20 min walks per day   Family History:  Relation Problem Comments  Mother (Deceased at age 59) Hypertension     Father (Deceased at age 35) Diabetes   Hypertension     Brother (Deceased) Colon polyps     Social History:  Smoking: none  Alcohol: none  Labs:  Lipid Panel     Component Value Date/Time   CHOL 200 12/15/2023 0249   TRIG 215 (H) 12/15/2023 0249   HDL 33 (L) 12/15/2023 0249   CHOLHDL 6.1 12/15/2023 0249   VLDL 43 (H) 12/15/2023 0249   LDLCALC 124 (H) 12/15/2023 0249   LDLDIRECT 141.5 02/03/2013 0907    Past Medical History:   Diagnosis Date   BPH (benign prostatic hypertrophy)    Cancer (HCC)    melanoma   Coronary artery disease    History of kidney stones    History of melanoma excision OCT 2010   Hyperlipidemia    Hypertension    Left ureteral calculus    Nephrolithiasis    bilateral   Type 2 diabetes mellitus (HCC)     Current Outpatient Medications on File Prior to Visit  Medication Sig Dispense Refill   Acetaminophen  (TYLENOL  8 HOUR ARTHRITIS PAIN PO) Take by mouth.     alum hydroxide-mag trisilicate (GAVISCON ) 80-20 MG CHEW chewable tablet Chew 2 tablets by mouth 3 (three) times daily as needed for indigestion or heartburn.     apixaban  (ELIQUIS ) 5 MG TABS tablet Take 1 tablet (5 mg total) by mouth 2 (two) times daily. 60 tablet 3   Cyanocobalamin  (VITAMIN B-12) 1000 MCG SUBL Place 1,000 mcg under the tongue. 1000 mcg SL daily     empagliflozin  (JARDIANCE ) 10 MG TABS tablet Take 1 tablet (10 mg total) by mouth daily before breakfast. 90 tablet 3   ezetimibe  (ZETIA ) 10 MG tablet Take 1 tablet (10 mg total) by mouth daily. 30 tablet 1   ferrous sulfate 325 (65 FE) MG tablet Take 325 mg by mouth daily with breakfast.  finasteride  (PROSCAR ) 5 MG tablet Take 1 tablet (5 mg total) by mouth daily. 90 tablet 3   LUMIGAN 0.01 % SOLN Place 1 drop into the left eye daily.     metFORMIN  (GLUCOPHAGE -XR) 500 MG 24 hr tablet Take 1 tablet (500 mg total) by mouth 2 (two) times daily. 180 tablet 1   Multiple Vitamin (MULTIVITAMIN) tablet Take 1 tablet by mouth daily.     Nutritional Supplements (NUTRITIONAL SUPPLEMENT PO) Take by mouth. Curamin     omeprazole  (PRILOSEC) 10 MG capsule Take 10 mg by mouth daily.     OVER THE COUNTER MEDICATION HA Joint Formula: Vit C, Magnesium , Collagen, Quercetin, Olive Extract, Boswellia     OVER THE COUNTER MEDICATION Joint Supplement     Potassium Citrate  15 MEQ (1620 MG) TBCR Take 1 tablet by mouth 3 (three) times daily. 270 tablet 3   sacubitril -valsartan  (ENTRESTO ) 49-51  MG Take 1 tablet by mouth 2 (two) times daily. 180 tablet 3   sildenafil  (REVATIO ) 20 MG tablet TAKE 3-5 TABLETS BY MOUTH EVERY DAY AS NEEDED 50 tablet 5   SIMBRINZA 1-0.2 % SUSP Place 1 drop into the left eye 2 (two) times daily.     timolol  (TIMOPTIC ) 0.5 % ophthalmic solution Place 1 drop into both eyes 2 (two) times daily.      Turmeric, Curcuma Longa, (CURCUMIN) POWD by Does not apply route.     Vitamin D3 (VITAMIN D ) 25 MCG tablet Take 1 tablet by mouth 2 (two) times daily.     No current facility-administered medications on file prior to visit.    Allergies  Allergen Reactions   Lipitor [Atorvastatin] Other (See Comments)    myalgias   Lisinopril    Morphine And Codeine Other (See Comments)     hypotension   Levofloxacin Rash   Septra [Sulfamethoxazole-Trimethoprim] Rash   Sulfamethoxazole Rash   Sulfamethoxazole-Trimethoprim Rash    Assessment/Plan:  1. Hyperlipidemia -  Problem  Hyperlipemia   Qualifier: Diagnosis of  By: Jimmy MD, Charlie Scarlet Current Medications: Zetia  10 mg daily  Intolerances: rosuvastatin  10 mg daily  Risk Factors: HTN, CAD,s/p CABG X3, hx of CVA, T2DM, statin induced myopathy, CKD LDL goal: <55 mg/dl  Last lab : LDL 875, HDL 33, TG 215, TC 200 11/2023    Hyperlipemia Assessment:  LDL goal: < 55 mg/dl and TG <849 mg/dl last LDLc  875 mg/dl TG 784 mg/dl (94/7974) Tolerates Zetia  well without any side effects  Intolerance to statins - atorvastatin long time back recently Crestor  10 mg- myalgia and muscle weakness   Discussed next potential options (low dose statins, PCSK-9 inhibitors, bempedoic acid and inclisiran); cost, dosing efficacy, side effects  Healthy diet and regular exercise importance discussed with pt   Plan: Continue taking current medications Zetia  10 mg daily  Start using Repatha  140 mg every 14 days and 1-2 week later try Crestor  5 mg daily if not tolerated well reduce dose to 5 mg every other day or even 5 mg 2 times per  week  Follow up lab due May 12, 2024      Thank you,  Robbi Blanch, [place].D [place] [facility]sp[facility] BIRCH. [facility] [address], [place], [place] [address] Phone: [phone]; Fax: [phone]

## 2024-02-13 ENCOUNTER — Telehealth: Payer: Self-pay | Admitting: Pharmacy Technician

## 2024-02-13 ENCOUNTER — Other Ambulatory Visit (HOSPITAL_COMMUNITY): Payer: Self-pay

## 2024-02-13 ENCOUNTER — Encounter: Payer: Self-pay | Admitting: Hematology

## 2024-02-13 ENCOUNTER — Encounter: Payer: Self-pay | Admitting: Pharmacist

## 2024-02-13 ENCOUNTER — Telehealth: Payer: Self-pay | Admitting: Pharmacist

## 2024-02-13 ENCOUNTER — Ambulatory Visit: Attending: Cardiology | Admitting: Pharmacist

## 2024-02-13 DIAGNOSIS — E7849 Other hyperlipidemia: Secondary | ICD-10-CM | POA: Diagnosis not present

## 2024-02-13 DIAGNOSIS — E785 Hyperlipidemia, unspecified: Secondary | ICD-10-CM

## 2024-02-13 MED ORDER — REPATHA SURECLICK 140 MG/ML ~~LOC~~ SOAJ: 140.0000 mg | SUBCUTANEOUS | 3 refills | Status: AC

## 2024-02-13 MED ORDER — ROSUVASTATIN CALCIUM 5 MG PO TABS: 5.0000 mg | ORAL_TABLET | Freq: Every day | ORAL | 3 refills | Status: AC

## 2024-02-13 NOTE — Patient Instructions (Signed)
 Your Results:             Your most recent labs Goal  Total Cholesterol 200 < 200  Triglycerides 215 < 150  HDL (happy/good cholesterol) 33 > 40  LDL (lousy/bad cholesterol 124 < 55   Medication changes: start taking rosuvastatin  5 mg daily if not tolerated hold it until symptoms resolves and try 5 mg every other day or even 2 times per week. Continue taking ezetimibe  10 gm daily.We will start the process to get PCSK9i ( Repatha  or Praluent) covered by your insurance.  Once the prior authorization is complete, we will call you to let you know and confirm pharmacy information.      Praluent is a cholesterol medication that improved your body's ability to get rid of bad cholesterol known as LDL. It can lower your LDL up to 60%. It is an injection that is given under the skin every 2 weeks. The most common side effects of Praluent include runny nose, symptoms of the common cold, rarely flu or flu-like symptoms, back/muscle pain in about 3-4% of the patients, and redness, pain, or bruising at the injection site.    Repatha  is a cholesterol medication that improved your body's ability to get rid of bad cholesterol known as LDL. It can lower your LDL up to 60%! It is an injection that is given under the skin every 2 weeks. The medication often requires a prior authorization from your insurance company.  The most common side effects of Repatha  include runny nose, symptoms of the common cold, rarely flu or flu-like symptoms, back/muscle pain in about 3-4% of the patients, and redness, pain, or bruising at the injection site.   Lab orders: We want to repeat labs after 2-3 months.  We will send you a lab order to remind you once we get closer to that time.

## 2024-02-13 NOTE — Telephone Encounter (Signed)
 In patient calls    Pharmacy Patient Advocate Encounter   Received notification from Pt Calls Messages that prior authorization for repatha  is required/requested.   Insurance verification completed.   The patient is insured through [facility] ADVANTAGE/RX ADVANCE .   Per test claim: PA required; PA submitted to above mentioned insurance via latent Key/confirmation #/EOC [id number] Status is pending   Pharmacy Patient Advocate Encounter  Received notification from [facility] ADVANTAGE/RX ADVANCE that Prior Authorization for repatha  has been APPROVED from 02/13/24 to 08/11/24. Ran test claim, Copay is $0.00. This test claim was processed through [facility]- copay amounts may vary at other pharmacies due to pharmacy/plan contracts, or as the patient moves through the different stages of their insurance plan.   PA #/Case ID/Reference #: [id number]

## 2024-02-13 NOTE — Assessment & Plan Note (Addendum)
 Assessment:  LDL goal: < 55 mg/dl and TG <849 mg/dl last LDLc  875 mg/dl TG 784 mg/dl (94/7974) Tolerates Zetia  well without any side effects  Intolerance to statins - atorvastatin long time back recently Crestor  10 mg- myalgia and muscle weakness   Discussed next potential options (low dose statins, PCSK-9 inhibitors, bempedoic acid and inclisiran); cost, dosing efficacy, side effects  Healthy diet and regular exercise importance discussed with pt   Plan: Continue taking current medications Zetia  10 mg daily  Start using Repatha  140 mg every 14 days and 1-2 week later try Crestor  5 mg daily if not tolerated well reduce dose to 5 mg every other day or even 5 mg 2 times per week  Follow up lab due May 12, 2024

## 2024-02-15 ENCOUNTER — Ambulatory Visit: Attending: Physician Assistant | Admitting: Physician Assistant

## 2024-02-15 ENCOUNTER — Encounter: Payer: Self-pay | Admitting: Physician Assistant

## 2024-02-15 ENCOUNTER — Other Ambulatory Visit: Payer: Self-pay | Admitting: Physician Assistant

## 2024-02-15 VITALS — BP 175/95 | HR 84 | Ht 67.0 in | Wt 147.6 lb

## 2024-02-15 DIAGNOSIS — E119 Type 2 diabetes mellitus without complications: Secondary | ICD-10-CM

## 2024-02-15 DIAGNOSIS — R222 Localized swelling, mass and lump, trunk: Secondary | ICD-10-CM | POA: Diagnosis not present

## 2024-02-15 DIAGNOSIS — I10 Essential (primary) hypertension: Secondary | ICD-10-CM | POA: Diagnosis not present

## 2024-02-15 DIAGNOSIS — I480 Paroxysmal atrial fibrillation: Secondary | ICD-10-CM | POA: Diagnosis not present

## 2024-02-15 DIAGNOSIS — I25810 Atherosclerosis of coronary artery bypass graft(s) without angina pectoris: Secondary | ICD-10-CM | POA: Diagnosis not present

## 2024-02-15 DIAGNOSIS — E785 Hyperlipidemia, unspecified: Secondary | ICD-10-CM

## 2024-02-15 NOTE — Patient Instructions (Signed)
 Medication Instructions:  NO CHANGES *If you need a refill on your cardiac medications before your next appointment, please call your pharmacy*  Lab Work: NO LABS If you have labs (blood work) drawn today and your tests are completely normal, you will receive your results only by: MyChart Message (if you have MyChart) OR A paper copy in the mail If you have any lab test that is abnormal or we need to change your treatment, we will call you to review the results.  Testing/Procedures: NO TESTING  Follow-Up: At [facility], you and your health needs are our priority.  As part of our continuing mission to provide you with exceptional heart care, our providers are all part of one team.  This team includes your primary Cardiologist (physician) and Advanced Practice Providers or APPs (Physician Assistants and Nurse Practitioners) who all work together to provide you with the care you need, when you need it.  Your next appointment:   4 month(s)  Provider:   Hao Meng, PA-C

## 2024-02-15 NOTE — Progress Notes (Signed)
 Cardiology Office Note   Date:  02/17/2024  ID:  James Braun, DOB December 20, 1940, MRN [id number] PCP: Jimmy Charlie FERNS, MD  [facility] Providers Cardiologist:  Oneil Parchment, MD Advanced Heart Failure:  Toribio Fuel, MD     History of Present Illness James Braun is a 83 y.o. male with PMH of CAD s/p CABG May 2021 by Dr. Fleeta Ochoa (LIMA-LAD, SVG-Dx, SVG-PL), postop atrial fibrillation, ICM, HTN, HLD, DM II, carotid artery disease, h/o melanoma and chronic dizziness.  During the hospitalization after his bypass surgery, he required amiodarone  and milrinone  for A-fib and shock, he quickly recovered.  Ejection fraction at the time of bypass surgery was 35 to 40%.  EF improved to 45 to 50% by repeat echocardiogram in November 2021.  Patient was seen by Glendia Ferrier PA-C in April 2025 at which time he was doing well.  Blood pressure was borderline elevated, it was recommended for the patient to increase Entresto  to 49-51 mg twice a day if blood pressure continue to be elevated.  More recently, patient was admitted to the hospital on 12/14/2023 with right leg weakness.  CT of head and neck was positive for chronic appearing occlusion of the distal right vertebral artery V4 segment.  MRI showed small patchy acute cortically based infarcts within the posterior left frontal lobe and the left parietal lobe.  Severe near occlusive stenosis is noted within the left anterior cerebral artery.  Hemoglobin A1c was 7.6.  LDL 124.  Patient was seen by neurology service who recommended dual antiplatelet therapy including aspirin  and Plavix  for 3 months then Plavix  monotherapy afterward.  Neurology also recommended to consider 30-day heart monitor versus loop recorder upon discharge.  Echocardiogram obtained on 12/15/2023 showed EF 45 to 50%, grade 1 DD, mild AI.  CTA of the neck also showed part of anterior mediastinal mass.  Subsequent CT of the chest showed a 5 cm anterior mediastinal mass with peripheral  enhancement.  Differential diagnosis includes thymoma versus thymic carcinoma versus lymphoma.  Radiology also raised the issue of possible thrombosed pseudoaneurysm.  Subsequent MRI is more consistent with neoplasm but cannot completely rule out possibility of thrombosed pseudoaneurysm.  Radiology service recommended PET CT to further evaluate.   I last saw the patient on 01/01/2024 at which time he denied any chest pain or shortness of breath.  I increased his Entresto  to 4951 mg twice a day.  I also recommended PCSK9 inhibitors versus inclisiran to help control his cholesterol.  He was seen in the lipid clinic on 02/13/2024 and was started on Repatha .  Heart monitor showed heart rate between 56-159, average rate of 89.  2 episode of nonsustained VT, longest lasting 9.8 seconds, 2 nonsustained SVT longest lasting 12 beats.  Less than 1% burden of A-fib, longest episode was 12 minutes.  Frequent supraventricular ectopy, 9.1%. He was started on Eliquis  5 mg twice a day by PCP. PET/CT test obtained on 01/15/2024 showed stable appearance of complex cystic lesion along the anterior superior mediastinum, with stability, could consider surveillance CT scan in 3 months, more aggressive approach could be considered as clinically directed.  Patient presents today for follow-up.  His blood pressure is elevated in the office today, however normally it is pretty well-controlled.  He says he has discussed his PET scan with Dr. Kerrin who felt continued observation in this case is reasonable.  Dr. Kerrin did not recommend any biopsy or additional testing at this time.  He has been doing well since  he started on Eliquis  for evidence of A-fib seen on heart monitor.  He denies any major bleeding issue.  He denies any chest pain or shortness of breath.  I offered to start him on low-dose carvedilol , however he has a prior history of dizziness on carvedilol  and metoprolol .  We ultimately decided to continue observation.  He  is aware to contact cardiology service if his systolic blood pressure is persistently over 140 at home, if so, I we will increase his Entresto  and potentially add the low-dose carvedilol  as a trial.  Overall, he has been doing well and we will follow-up with him in 4 months.    ROS:   He denies chest pain, palpitations, dyspnea, pnd, orthopnea, n, v, dizziness, syncope, edema, weight gain, or early satiety. All other systems reviewed and are otherwise negative except as noted above.    Studies Reviewed      Cardiac Studies & Procedures   ______________________________________________________________________________________________ CARDIAC CATHETERIZATION  CARDIAC CATHETERIZATION 11/27/2019  Conclusion Images from the original result were not included.   Prox LAD to Mid LAD lesion is 95% stenosed.  3rd Diag lesion is 90% stenosed.  Prox RCA lesion is 95% stenosed.  Mid RCA lesion is 95% stenosed with 95% stenosed side branch in RV Branch.  Mid RCA to Dist RCA lesion is 95% stenosed.  There is moderate to severe left ventricular systolic dysfunction.  LV end diastolic pressure is mildly elevated.  The left ventricular ejection fraction is 25-35% by visual estimate.  James Braun is a 83 y.o. male   [id number] LOCATION:  FACILITY: [facility] PHYSICIAN: Dorn Lesches, M.D. 20-Nov-1940   DATE OF PROCEDURE:  11/27/2019  DATE OF DISCHARGE:     CARDIAC CATHETERIZATION    History obtained from chart review.James Braun is a 83 y.o.  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  He has a 96% success rate.  He was referred by Dr. Necia for cardiovascular evaluation because of new onset chest pain.  I last saw him in the office 06/17/2019. He does have a history of treated hypertension, diabetes and hyperlipidemia.  He is never had a heart attack or stroke.  He has no other  cardiac risk factors.  He had a stressful event in his life 2 and half weeks ago chest pain subsequent to that and daily since.  The pain is not necessarily occur with exertion.  It occurred randomly lasted minutes at a time without radiation.  He does exercise 3 days a week to 30 minutes at a time on the treadmill and does intermittent fasting as well.  I performed nuclear stress testing 01/09/2018 which was low risk and nonischemic and obtain a 2D echocardiogram 01/07/2018 which was essentially unremarkable as well.  He continues to exercise, and is active without limitation.  His only complaint is he does not have the energy that he used to.  He was recently evaluated underwent endoscopy revealing a large hiatal hernia.  Is being considered for laparoscopic hiatal hernia repair.  He was referred back to me by his gastroenterologist because of an exertional component to his chest pain to rule out an ischemic etiology.  A Myoview  stress test showed inferior scar without ischemia clearly different than his last stress test done 2 years ago.  Because of this I elected to proceed with outpatient diagnostic coronary angiography to define his anatomy.  Impression  Mr. Rayos has severe two-vessel disease with high-grade proximal LAD disease, ostial second diagonal branch disease and severe high-grade tandem lesions in a tortuous dominant RCA with grade 2-3 left-to-right collaterals.  He does have moderate least severe LV dysfunction with an EF in the 30 to 35% range and more prominent inferior wall motion abnormality.  I suspect his LV dysfunction is ischemically mediated and represents hibernating myocardium.  He will need CABG.  He is on aspirin .  T CTS (Dr. Obadiah) has been consulted.  The sheath was removed and a TR band was placed on the right wrist to achieve patent hemostasis.  The patient left the lab in stable condition.  Dorn Lesches. MD, [facility] 11/27/2019 3:11 PM  Findings Coronary  Findings Diagnostic  Dominance: Right  Left Anterior Descending Prox LAD to Mid LAD lesion is 95% stenosed.  Third Diagonal Branch 3rd Diag lesion is 90% stenosed.  Right Coronary Artery Prox RCA lesion is 95% stenosed. Mid RCA lesion is 95% stenosed with 95% stenosed side branch in RV Branch. Mid RCA to Dist RCA lesion is 95% stenosed.  Right Posterior Atrioventricular Artery Collaterals RPAV filled by collaterals from Dist LAD.  Intervention  No interventions have been documented.   STRESS TESTS  MYOCARDIAL PERFUSION IMAGING 11/19/2019  Interpretation Summary  Nuclear stress EF: 37%. The left ventricular ejection fraction is moderately decreased (30-44%).  There was no ST segment deviation noted during stress.  Defect 1: There is a large defect of severe severity present in the basal inferoseptal, basal inferior, basal inferolateral, mid inferoseptal, mid inferior, mid inferolateral, apical anterior, apical septal, apical inferior, apical lateral and apex location.  Findings consistent with prior myocardial infarction. There is no evidence of ischemia .  This is an intermediate risk study.   ECHOCARDIOGRAM  ECHOCARDIOGRAM COMPLETE 12/15/2023  Narrative ECHOCARDIOGRAM REPORT    Patient Name:   BURNIS HALLING Date of Exam: 12/15/2023 Medical Rec #:  [id number]      Height:       68.0 in Accession #:    [id number]     Weight:       140.0 lb Date of Birth:  October 09, 1940      BSA:          1.756 m Patient Age:    82 years       BP:           164/99 mmHg Patient Gender: M              HR:           83 bpm. Exam Location:  Inpatient  Procedure: 2D Echo, Cardiac Doppler and Color Doppler (Both Spectral and Color Flow Doppler were utilized during procedure).  Indications:    Stroke I63.9  History:        Patient has prior history of Echocardiogram examinations, most recent 05/19/2020. Risk Factors:Hypertension.  Sonographer:    Benard Stallion Referring Phys: [id number]  DONALDA M GHIMIRE  IMPRESSIONS   1. Left ventricular ejection fraction, by estimation, is 45 to 50%. The left ventricle has mildly decreased function. The left ventricle demonstrates global hypokinesis. Left ventricular diastolic parameters are consistent with Grade I diastolic dysfunction (impaired relaxation). Elevated left atrial pressure. 2. Right ventricular systolic function is normal. The right ventricular size is normal. There is normal pulmonary artery systolic pressure. 3. The mitral valve is normal in structure. No evidence of mitral valve regurgitation. No evidence of mitral stenosis. 4. The tricuspid valve is abnormal. 5. The  aortic valve is tricuspid. There is moderate calcification of the aortic valve. There is moderate thickening of the aortic valve. Aortic valve regurgitation is mild. No aortic stenosis is present. 6. The inferior vena cava is normal in size with greater than 50% respiratory variability, suggesting right atrial pressure of 3 mmHg.  FINDINGS Left Ventricle: Left ventricular ejection fraction, by estimation, is 45 to 50%. The left ventricle has mildly decreased function. The left ventricle demonstrates global hypokinesis. The left ventricular internal cavity size was normal in size. There is no left ventricular hypertrophy. Left ventricular diastolic parameters are consistent with Grade I diastolic dysfunction (impaired relaxation). Elevated left atrial pressure.  Right Ventricle: The right ventricular size is normal. Right vetricular wall thickness was not well visualized. Right ventricular systolic function is normal. There is normal pulmonary artery systolic pressure. The tricuspid regurgitant velocity is 2.67 m/s, and with an assumed right atrial pressure of 3 mmHg, the estimated right ventricular systolic pressure is 31.5 mmHg.  Left Atrium: Left atrial size was normal in size.  Right Atrium: Right atrial size was normal in size.  Pericardium: There is no  evidence of pericardial effusion.  Mitral Valve: The mitral valve is normal in structure. There is mild thickening of the mitral valve leaflet(s). There is mild calcification of the mitral valve leaflet(s). Mild mitral annular calcification. No evidence of mitral valve regurgitation. No evidence of mitral valve stenosis. MV peak gradient, 7.5 mmHg. The mean mitral valve gradient is 2.0 mmHg.  Tricuspid Valve: The tricuspid valve is abnormal. Tricuspid valve regurgitation is mild . No evidence of tricuspid stenosis.  Aortic Valve: The aortic valve is tricuspid. There is moderate calcification of the aortic valve. There is moderate thickening of the aortic valve. There is moderate aortic valve annular calcification. Aortic valve regurgitation is mild. Aortic regurgitation PHT measures 413 msec. No aortic stenosis is present. Aortic valve mean gradient measures 6.5 mmHg. Aortic valve peak gradient measures 12.6 mmHg. Aortic valve area, by VTI measures 1.53 cm.  Pulmonic Valve: The pulmonic valve was not well visualized. Pulmonic valve regurgitation is mild. No evidence of pulmonic stenosis.  Aorta: The aortic root and ascending aorta are structurally normal, with no evidence of dilitation.  Venous: The inferior vena cava is normal in size with greater than 50% respiratory variability, suggesting right atrial pressure of 3 mmHg.  IAS/Shunts: No atrial level shunt detected by color flow Doppler.   LEFT VENTRICLE PLAX 2D LVIDd:         4.20 cm      Diastology LVIDs:         3.30 cm      LV e' medial:    4.46 cm/s LV PW:         0.95 cm      LV E/e' medial:  19.7 LV IVS:        0.95 cm      LV e' lateral:   8.16 cm/s LVOT diam:     2.10 cm      LV E/e' lateral: 10.8 LV SV:         55 LV SV Index:   31 LVOT Area:     3.46 cm  LV Volumes (MOD) LV vol d, MOD A2C: 101.0 ml LV vol d, MOD A4C: 82.7 ml LV vol s, MOD A2C: 60.0 ml LV vol s, MOD A4C: 60.6 ml LV SV MOD A2C:     41.0 ml LV SV MOD  A4C:     82.7 ml  LV SV MOD BP:      31.0 ml  RIGHT VENTRICLE RV Basal diam:  3.60 cm RV Mid diam:    3.10 cm RV S prime:     9.79 cm/s TAPSE (M-mode): 1.6 cm  LEFT ATRIUM             Index        RIGHT ATRIUM           Index LA diam:        2.30 cm 1.31 cm/m   RA Area:     12.40 cm LA Vol (A2C):   44.8 ml 25.51 ml/m  RA Volume:   25.50 ml  14.52 ml/m LA Vol (A4C):   40.4 ml 23.00 ml/m LA Biplane Vol: 43.5 ml 24.77 ml/m AORTIC VALVE                     PULMONIC VALVE AV Area (Vmax):    1.70 cm      PR End Diast Vel: 7.84 msec AV Area (Vmean):   1.75 cm AV Area (VTI):     1.53 cm AV Vmax:           177.50 cm/s AV Vmean:          117.000 cm/s AV VTI:            0.361 m AV Peak Grad:      12.6 mmHg AV Mean Grad:      6.5 mmHg LVOT Vmax:         87.30 cm/s LVOT Vmean:        59.000 cm/s LVOT VTI:          0.159 m LVOT/AV VTI ratio: 0.44 AI PHT:            413 msec  AORTA Ao Root diam: 3.00 cm Ao Asc diam:  3.20 cm  MITRAL VALVE                TRICUSPID VALVE MV Area (PHT): 3.53 cm     TR Peak grad:   28.5 mmHg MV Peak grad:  7.5 mmHg     TR Vmax:        267.00 cm/s MV Mean grad:  2.0 mmHg MV Vmax:       1.37 m/s     SHUNTS MV Vmean:      61.1 cm/s    Systemic VTI:  0.16 m MV Decel Time: 215 msec     Systemic Diam: 2.10 cm MV E velocity: 87.80 cm/s MV A velocity: 136.00 cm/s MV E/A ratio:  0.65  Dorn Ross MD Electronically signed by Dorn Ross MD Signature Date/Time: 12/15/2023/3:24:52 PM    Final   TEE  ECHO INTRAOPERATIVE TEE 11/28/2019  Narrative *INTRAOPERATIVE TRANSESOPHAGEAL REPORT *    Patient Name:   AKING KLABUNDE Date of Exam: 11/28/2019 Medical Rec #:  [id number]      Height:       67.0 in Accession #:    [id number]     Weight:       149.7 lb Date of Birth:  09/21/1940      BSA:          1.79 m Patient Age:    78 years       BP:           138/82 mmHg Patient Gender: M              HR:  77 bpm. Exam Location:   Inpatient  Transesophogeal exam was perform intraoperatively during surgical procedure. Patient was closely monitored under general anesthesia during the entirety of examination.  Indications:     I25.110 Atherosclerotic heart disease of native coronary artery with unstable angina pectoris Performing Phys: Bernardino Mango MD Diagnosing Phys: Bernardino Mango MD  Complications: No known complications during this procedure. POST-OP IMPRESSIONS - Left Ventricle: has mildly reduced systolic function, with an ejection fraction of 30%. - Aorta: The aorta appears unchanged from pre-bypass. - Left Atrial Appendage: The left atrial appendage appears unchanged from pre-bypass. - Aortic Valve: The aortic valve appears unchanged from pre-bypass. - Mitral Valve: The mitral valve appears unchanged from pre-bypass. - Tricuspid Valve: There is trace regurgitation. - Interatrial Septum: Leftward bowing of the intratrial septum suggesting increased right atrial pressure. - Pericardium: The pericardium appears unchanged from pre-bypass.  PRE-OP FINDINGS Left Ventricle: The left ventricle has mild-moderately reduced systolic function, with an ejection fraction of 40-45%. The cavity size was normal. There is no increase in left ventricular wall thickness.  Right Ventricle: The right ventricle has normal systolic function. The cavity was mildly enlarged. There is mild increase in right ventricular wall thickness.  Left Atrium: Left atrial size was normal in size. The left atrial appendage is well visualized and there is no evidence of thrombus present.  Right Atrium: Right atrial size was normal in size. A Chiari network is visualized.  Interatrial Septum: No atrial level shunt detected by color flow Doppler.  Pericardium: There is no evidence of pericardial effusion.  Mitral Valve: The mitral valve is normal in structure. Mitral valve regurgitation is mild by color flow Doppler. The MR jet is  centrally-directed.  Tricuspid Valve: The tricuspid valve was normal in structure. Tricuspid valve regurgitation was not visualized by color flow Doppler.  Aortic Valve: The aortic valve is tricuspid. There is mild thickening and reduced mobility of the non-coronary cusp of the aortic valve. Aortic valve regurgitation was not visualized by color flow Doppler. There is no evidence of aortic valve stenosis.  Pulmonic Valve: The pulmonic valve was normal in structure. Pulmonic valve regurgitation is trivial by color flow Doppler.   +--------------+-------++ LEFT VENTRICLE        +--------------+-------++ PLAX 2D               +--------------+-------++ LVIDd:        4.46 cm +--------------+-------++ LVIDs:        3.86 cm +--------------+-------++ LV SV:        26 ml   +--------------+-------++ LV SV Index:  14.59   +--------------+-------++                       +--------------+-------++  +-------------+-----------++ AORTIC VALVE             +-------------+-----------++ AV Vmax:     124.00 cm/s +-------------+-----------++ AV Vmean:    93.200 cm/s +-------------+-----------++ AV VTI:      0.301 m     +-------------+-----------++ AV Peak Grad:6.2 mmHg    +-------------+-----------++ AV Mean Grad:4.0 mmHg    +-------------+-----------++   Bernardino Mango MD Electronically signed by Bernardino Mango MD Signature Date/Time: 11/28/2019/5:29:15 PM    Final  MONITORS  LONG TERM MONITOR (3-14 DAYS) 01/11/2024  Narrative HR 56 to 159, average 89. 2 nonsustained VT, longest 9.8 seconds 9 nonsustained SVT, longest 12 beats <1% burden of atrial fibrillation, longest episode lasting 12 minutes Frequent supraventricular ectopy, 9.1% Rare ventricular ectopy  Patient  established with cardiology. Note sent to cardiologist/PCP re: AF on monitor.  Ole T. Cindie, MD, [facility], [facility] Cardiac Electrophysiology        ______________________________________________________________________________________________      Risk Assessment/Calculations  CHA2DS2-VASc Score = 8   This indicates a 10.8% annual risk of stroke. The patient's score is based upon: CHF History: 1 HTN History: 1 Diabetes History: 1 Stroke History: 2 Vascular Disease History: 1 Age Score: 2 Gender Score: 0           Physical Exam VS:  BP (!) 175/95 (BP Location: Left Arm, Patient Position: Sitting, Cuff Size: Normal)   Pulse 84   Ht 5' 7 (1.702 m)   Wt 147 lb 9.6 oz (67 kg)   SpO2 94%   BMI 23.12 kg/m        Wt Readings from Last 3 Encounters:  02/15/24 147 lb 9.6 oz (67 kg)  02/05/24 146 lb (66.2 kg)  01/22/24 150 lb (68 kg)    GEN: Well nourished, well developed in no acute distress NECK: No JVD; No carotid bruits CARDIAC: RRR, no murmurs, rubs, gallops RESPIRATORY:  Clear to auscultation without rales, wheezing or rhonchi  ABDOMEN: Soft, non-tender, non-distended EXTREMITIES:  No edema; No deformity   ASSESSMENT AND PLAN  Paroxysmal atrial fibrillation: Eliquis  was recently started by PCP after heart monitor showed brief atrial fibrillation.  Patient did not have any significant cardiac awareness.  He has previous intolerance to avoid beta-blocker such as carvedilol  and the metoprolol , I offered to add a low-dose carvedilol  to see if we can reduce the recurrence of A-fib further, he wished to hold off at this time.  CAD: Denies any significant chest discomfort.  On rosuvastatin   Hypertension: Blood pressure quite elevated today, however was normal when he saw his PCP.  We decided to continue monitoring  Hyperlipidemia: On Zetia  and Repatha   DM2: Managed by primary care provider  Intrathoracic mass: Recently underwent PET/CT, according to the patient, he discussed this with Dr. Kerrin of CT surgery who recommended continued observation.         Dispo: Follow-up in 26-month  Signed, Coyt Govoni,  [place]

## 2024-02-19 ENCOUNTER — Telehealth: Payer: Self-pay

## 2024-02-19 NOTE — Telephone Encounter (Signed)
 When reviewing care gaps patient last note from 7/22 patient wanted to see if he could set up TOC with Dr. Rilla for follow up in 6 months. Do not see where that was made. Ok to call and set up with you as requested?

## 2024-02-19 NOTE — Telephone Encounter (Signed)
 Ok to schedule.

## 2024-02-20 ENCOUNTER — Telehealth: Payer: Self-pay

## 2024-02-20 MED ORDER — METFORMIN HCL ER 500 MG PO TB24: 500.0000 mg | ORAL_TABLET | Freq: Two times a day (BID) | ORAL | 1 refills | Status: DC

## 2024-02-20 NOTE — Telephone Encounter (Signed)
 Rx sent electronically.

## 2024-02-21 NOTE — Telephone Encounter (Signed)
 Called  pt and schedule a appt for TOC / 42month f/u

## 2024-03-05 ENCOUNTER — Telehealth: Payer: Self-pay | Admitting: Gastroenterology

## 2024-03-05 NOTE — Telephone Encounter (Signed)
 Attempted to reach patient. No answer, left VM for patient to return call.

## 2024-03-05 NOTE — Telephone Encounter (Signed)
 Patient returning phone call. Please advise, thank you

## 2024-03-05 NOTE — Telephone Encounter (Signed)
 Inbound call from patient stating he has been bleeding from his anus. Requesting a call back to discuss further and recommendations. Please advise, thank you

## 2024-03-05 NOTE — Telephone Encounter (Signed)
 Spoke with patient. States he started Eliquis  about 1 month ago. Reports every time he has a bm, there is BRBPR. States he does have some issues with constipation, however he takes stool softeners so he doesn't have to strain often. Last colonoscopy in 2021 showed only diverticulosis. No hemorrhoids. Patient scheduled for 03/06/2024 at 10:40 AM with Camie.

## 2024-03-05 NOTE — Telephone Encounter (Signed)
 Inbound call from pt returning a call back to brandi. Patient is requesting a call back. Please advise.

## 2024-03-06 ENCOUNTER — Other Ambulatory Visit: Payer: Self-pay | Admitting: Internal Medicine

## 2024-03-06 ENCOUNTER — Encounter: Payer: Self-pay | Admitting: Gastroenterology

## 2024-03-06 ENCOUNTER — Ambulatory Visit: Admitting: Gastroenterology

## 2024-03-06 ENCOUNTER — Other Ambulatory Visit (INDEPENDENT_AMBULATORY_CARE_PROVIDER_SITE_OTHER)

## 2024-03-06 ENCOUNTER — Ambulatory Visit: Payer: Self-pay | Admitting: Gastroenterology

## 2024-03-06 VITALS — BP 130/70 | HR 82 | Ht 66.0 in | Wt 143.0 lb

## 2024-03-06 DIAGNOSIS — I480 Paroxysmal atrial fibrillation: Secondary | ICD-10-CM | POA: Diagnosis not present

## 2024-03-06 DIAGNOSIS — K625 Hemorrhage of anus and rectum: Secondary | ICD-10-CM

## 2024-03-06 DIAGNOSIS — Z7901 Long term (current) use of anticoagulants: Secondary | ICD-10-CM

## 2024-03-06 DIAGNOSIS — K5900 Constipation, unspecified: Secondary | ICD-10-CM

## 2024-03-06 DIAGNOSIS — D509 Iron deficiency anemia, unspecified: Secondary | ICD-10-CM

## 2024-03-06 DIAGNOSIS — K648 Other hemorrhoids: Secondary | ICD-10-CM

## 2024-03-06 DIAGNOSIS — Z8673 Personal history of transient ischemic attack (TIA), and cerebral infarction without residual deficits: Secondary | ICD-10-CM

## 2024-03-06 DIAGNOSIS — E1121 Type 2 diabetes mellitus with diabetic nephropathy: Secondary | ICD-10-CM

## 2024-03-06 LAB — COMPREHENSIVE METABOLIC PANEL WITH GFR
ALT: 10 U/L (ref 0–53)
AST: 10 U/L (ref 0–37)
Albumin: 4.4 g/dL (ref 3.5–5.2)
Alkaline Phosphatase: 53 U/L (ref 39–117)
BUN: 33 mg/dL — ABNORMAL HIGH (ref 6–23)
CO2: 29 meq/L (ref 19–32)
Calcium: 9.3 mg/dL (ref 8.4–10.5)
Chloride: 104 meq/L (ref 96–112)
Creatinine, Ser: 1.51 mg/dL — ABNORMAL HIGH (ref 0.40–1.50)
GFR: 42.3 mL/min — ABNORMAL LOW (ref >60.00)
Glucose, Bld: 140 mg/dL — ABNORMAL HIGH (ref 70–99)
Potassium: 4.2 meq/L (ref 3.5–5.1)
Sodium: 142 meq/L (ref 135–145)
Total Bilirubin: 0.6 mg/dL (ref 0.2–1.2)
Total Protein: 7.2 g/dL (ref 6.0–8.3)

## 2024-03-06 LAB — CBC WITH DIFFERENTIAL/PLATELET
Basophils Absolute: 0 K/uL (ref 0.0–0.1)
Basophils Relative: 0.9 % (ref 0.0–3.0)
Eosinophils Absolute: 0.1 K/uL (ref 0.0–0.7)
Eosinophils Relative: 1.7 % (ref 0.0–5.0)
HCT: 45.7 % (ref 39.0–52.0)
Hemoglobin: 15 g/dL (ref 13.0–17.0)
Lymphocytes Relative: 11.6 % — ABNORMAL LOW (ref 12.0–46.0)
Lymphs Abs: 0.6 K/uL — ABNORMAL LOW (ref 0.7–4.0)
MCHC: 32.9 g/dL (ref 30.0–36.0)
MCV: 87.6 fl (ref 78.0–100.0)
Monocytes Absolute: 0.5 K/uL (ref 0.1–1.0)
Monocytes Relative: 8.9 % (ref 3.0–12.0)
Neutro Abs: 4.1 K/uL (ref 1.4–7.7)
Neutrophils Relative %: 76.9 % (ref 43.0–77.0)
Platelets: 360 K/uL (ref 150.0–400.0)
RBC: 5.22 Mil/uL (ref 4.22–5.81)
RDW: 16 % — ABNORMAL HIGH (ref 11.5–15.5)
WBC: 5.4 K/uL (ref 4.0–10.5)

## 2024-03-06 MED ORDER — HYDROCORTISONE ACETATE 25 MG RE SUPP: 25.0000 mg | Freq: Every day | RECTAL | 0 refills | Status: AC

## 2024-03-06 NOTE — Patient Instructions (Addendum)
 Restart Eliquis .  We have sent the following medications to your pharmacy for you to pick up at your convenience: hydrocortisone  suppositories   Your provider has requested that you go to the basement level for lab work before leaving today. Press B on the elevator. The lab is located at the first door on the left as you exit the elevator.   Pick up Miralax  over the counter at your local drug store. Take Miralax  1 capful daily in 8 ounces of liquid for daily constipation. Adjust dose as needed.   Take dulcolax only as needed.   _______________________________________________________  If your blood pressure at your visit was 140/90 or greater, please contact your primary care physician to follow up on this.  _______________________________________________________  If you are age 84 or older, your body mass index should be between 23-30. Your Body mass index is 23.08 kg/m. If this is out of the aforementioned range listed, please consider follow up with your Primary Care Provider.  If you are age 84 or younger, your body mass index should be between 19-25. Your Body mass index is 23.08 kg/m. If this is out of the aformentioned range listed, please consider follow up with your Primary Care Provider.   ________________________________________________________  The [facility] providers would like to encourage you to use MYCHART to communicate with providers for non-urgent requests or questions.  Due to long hold times on the telephone, sending your provider a message by [facility] may be a faster and more efficient way to get a response.  Please allow 48 business hours for a response.  Please remember that this is for non-urgent requests.  _______________________________________________________  [facility] is using a team-based approach to care.  Your team is made up of your doctor and two to three APPS. Our APPS (Nurse Practitioners and Physician Assistants) work with your  physician to ensure care continuity for you. They are fully qualified to address your health concerns and develop a treatment plan. They communicate directly with your gastroenterologist to care for you. Seeing the Advanced Practice Practitioners on your physician's team can help you by facilitating care more promptly, often allowing for earlier appointments, access to diagnostic testing, procedures, and other specialty referrals.

## 2024-03-06 NOTE — Progress Notes (Signed)
 James Braun

## 2024-03-06 NOTE — Progress Notes (Signed)
 James Braun [id number] [birth year]   Chief Complaint: Rectal bleeding  Referring Provider: Jimmy Charlie FERNS, MD Primary GI MD: Dr. Legrand  HPI: James Braun is a 83 y.o. male with past medical history of melanoma, kidney stones, HLD, HTN, T2DM, cholecystectomy, CABG 2021, ischemic cardiomyopathy, paroxysmal A-fib, recent stroke, newly discovered anterior mediastinal mass.  Dizzy who presents today for a complaint of rectal bleeding and constipation.    Patient last seen in office 11/08/2020 by Dr. Legrand for evaluation of iron deficiency anemia.  He had previously undergone EGD 10/2019 with finding of large hiatal hernia and referred to general surgery to consider repair.  He elected not to have that done.  Underwent colonoscopy 04/2020 with finding of diverticulosis and otherwise unremarkable.  Capsule endoscopy not performed due to concern that it would be retained in his herniated stomach and not sufficiently evaluate the small bowel.  IDA thought due to chronic occult GI blood loss, most likely gastric erosions from large hiatal hernia, or possibly more distal small bowel source such as AVM.  Plan at that time was for close monitoring of hemoglobin and iron levels with hematology and periodic IV iron treatments.  Patient was admitted 12/14/2023 to 12/16/2023 with acute CVA.  Presented to the ED with complaint of 1 week of right leg weakness.  Described a couple of falls due to right leg weakness and had sensation of dizziness and feeling off balance.  Symptoms initially improved but had come back intermittently throughout the week.  Had spoken to his PCPs office and was advised to go to the ER for further evaluation.  Unclear whether stroke was embolic (history of perioperative A-fib post CABG) or thromboembolic from intracranial atherosclerosis.  Symptoms had resolved, patient was symptom-free and wanted to go home.  He was discharged on DAPT per neuro (aspirin  and Plavix ).  01/14/2024 Zio  monitor results showed about 1% atrial fibrillation.  At that time was started on Eliquis  5 mg twice daily.  Plavix  discontinued.  Of note, as part of his workup for stroke he had a CT of the chest which showed a 5 cm mass with peripheral enhancement.  MRI was done with and without contrast and there was no evidence of flow into the lesion at all.  He then had a PET/CT which showed peripherally enhancing mass with central necrosis.  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  Patient has opted not to pursue diagnostic workup.  Patient called 03/05/2024 stating that he has been having BRBPR with every bowel movement since starting Eliquis  about a month ago.  On stool softeners and denied any straining.  Last CBC 12/19/2023 showed hemoglobin 15.5, hematocrit 47, MCV 86.7   Patient states he has a history of intermittent BRBPR ongoing for the last 4 to 5 years.  Typically would just see a spot of blood on the toilet paper every now and then.  Since starting Eliquis , he has noticed increased rectal bleeding.  This does not occur with every bowel movement.  States that he first noticed worsening bleeding when he was showering, and used a digit to clean his anus.  Subsequently had increased BRBPR.  States that he does feel a perianal skin tag.  He has been using Tronolane topical treatment when he gets out of the shower which helps relieve perianal irritation.  Notes that bleeding seems to be coming from the same place each time.  Feels he might be rupturing a tag and causing bleeding.  Had a bowel movement this morning without any blood noted.  He states that he has been dealing with constipation.,  Was using stool softeners but this caused diarrhea.  He has been using Dulcolax as an alternative 2-3 times a week.  Has not tried MiraLAX .  Denies any abdominal pain or rectal pain.  Just has mild irritation which is relieved by topical treatment.  He denies any nausea, vomiting, fever,  chills.  States that ever since he had heart surgery he has occasional dizziness but this is not a new problem and is not worsening.  He denies any syncopal events.  Previous GI Procedures/Imaging   Colonoscopy 04/22/2020 - Diverticulosis in the left colon.  - The examination was otherwise normal on direct and retroflexion views.  - No specimens collected. - IDA most likely from intermittent gastric erosions due to large hiatal hernia seen on EGD earlier this year.  EGD 11/06/2019 - Tortuous esophagus.  - Large hiatal hernia.  - Normal examined duodenum.  - No specimens collected.  Past Medical History:  Diagnosis Date   BPH (benign prostatic hypertrophy)    Cancer (HCC)    melanoma   Coronary artery disease    History of kidney stones    History of melanoma excision OCT 2010   Hyperlipidemia    Hypertension    Left ureteral calculus    Nephrolithiasis    bilateral   Type 2 diabetes mellitus (HCC)     Past Surgical History:  Procedure Laterality Date   CARDIAC CATHETERIZATION     CHOLECYSTECTOMY  01/1999   CORNEAL TRANSPLANT Right 2023   CORONARY ARTERY BYPASS GRAFT N/A 11/28/2019   Procedure: CORONARY ARTERY BYPASS GRAFTING (CABG) x3, using left internal mammary artery and Saphenous vein harvested endoscopically;  Surgeon: Fleeta Hanford Coy, MD;  Location: [facility] OR;  Service: Open Heart Surgery;  Laterality: N/A;   CYSTOSCOPY WITH STENT PLACEMENT Left 11/14/2012   Procedure: CYSTOSCOPY WITH STENT PLACEMENT;  Surgeon: Garnette Shack, MD;  Location: [facility];  Service: Urology;  Laterality: Left;   CYSTOSCOPY/RETROGRADE/URETEROSCOPY/STONE EXTRACTION WITH BASKET Left 11/14/2012   Procedure: CYSTOSCOPY/RETROGRADE/URETEROSCOPY/STONE EXTRACTION WITH BASKET;  Surgeon: Garnette Shack, MD;  Location: [facility];  Service: Urology;  Laterality: Left;   EXTRACORPOREAL SHOCK WAVE LITHOTRIPSY  01/1999   X2   HOLMIUM LASER APPLICATION Left 11/14/2012    Procedure: HOLMIUM LASER APPLICATION;  Surgeon: Garnette Shack, MD;  Location: [facility];  Service: Urology;  Laterality: Left;   LEFT HEART CATH AND CORONARY ANGIOGRAPHY N/A 11/27/2019   Procedure: LEFT HEART CATH AND CORONARY ANGIOGRAPHY;  Surgeon: Court Dorn PARAS, MD;  Location: [facility] INVASIVE CV LAB;  Service: Cardiovascular;  Laterality: N/A;   LEFT URETEROSCOPIC STONE EXTRACTION  02/22/2007   NEPHROLITHOTOMY Right 09/28/2010   percutaneous   ORIF CLAVICLE FRACTURE  2006   PROSTATE BIOPSY  1998   neg   SHOULDER ARTHROSCOPY  07/2013   Dr Rubie   TEE WITHOUT CARDIOVERSION N/A 11/28/2019   Procedure: TRANSESOPHAGEAL ECHOCARDIOGRAM (TEE);  Surgeon: Fleeta Hanford, Coy, MD;  Location: [facility] OR;  Service: Open Heart Surgery;  Laterality: N/A;   TONSILLECTOMY      Current Outpatient Medications  Medication Sig Dispense Refill   Acetaminophen  (TYLENOL  8 HOUR ARTHRITIS PAIN PO) Take by mouth.     alum hydroxide-mag trisilicate (GAVISCON ) 80-20 MG CHEW chewable tablet Chew 2 tablets by mouth 3 (three) times daily as needed for indigestion or heartburn.     apixaban  (ELIQUIS ) 5 MG  TABS tablet Take 1 tablet (5 mg total) by mouth 2 (two) times daily. 60 tablet 3   Cyanocobalamin  (VITAMIN B-12) 1000 MCG SUBL Place 1,000 mcg under the tongue. 1000 mcg SL daily     empagliflozin  (JARDIANCE ) 10 MG TABS tablet Take 1 tablet (10 mg total) by mouth daily before breakfast. 90 tablet 3   Evolocumab  (REPATHA  SURECLICK) 140 MG/ML SOAJ Inject 140 mg into the skin every 14 (fourteen) days. 6 mL 3   ezetimibe  (ZETIA ) 10 MG tablet Take 1 tablet (10 mg total) by mouth daily. 30 tablet 1   ferrous sulfate 325 (65 FE) MG tablet Take 325 mg by mouth daily with breakfast.     finasteride  (PROSCAR ) 5 MG tablet Take 1 tablet (5 mg total) by mouth daily. 90 tablet 3   hydrocortisone  (ANUSOL -HC) 25 MG suppository Place 1 suppository (25 mg total) rectally at bedtime for 10 days. 10 suppository 0    LUMIGAN 0.01 % SOLN Place 1 drop into the left eye daily.     metFORMIN  (GLUCOPHAGE -XR) 500 MG 24 hr tablet Take 1 tablet (500 mg total) by mouth 2 (two) times daily. 180 tablet 1   Multiple Vitamin (MULTIVITAMIN) tablet Take 1 tablet by mouth daily.     Nutritional Supplements (NUTRITIONAL SUPPLEMENT PO) Take by mouth. Curamin     omeprazole  (PRILOSEC) 10 MG capsule Take 10 mg by mouth daily.     OVER THE COUNTER MEDICATION HA Joint Formula: Vit C, Magnesium , Collagen, Quercetin, Olive Extract, [place]ia     OVER THE COUNTER MEDICATION Joint Supplement     Potassium Citrate  15 MEQ (1620 MG) TBCR Take 1 tablet by mouth 3 (three) times daily. 270 tablet 3   rosuvastatin  (CRESTOR ) 5 MG tablet Take 1 tablet (5 mg total) by mouth daily. 90 tablet 3   sacubitril -valsartan  (ENTRESTO ) 49-51 MG Take 1 tablet by mouth 2 (two) times daily. 180 tablet 3   sildenafil  (REVATIO ) 20 MG tablet TAKE 3-5 TABLETS BY MOUTH EVERY DAY AS NEEDED 50 tablet 5   SIMBRINZA 1-0.2 % SUSP Place 1 drop into the left eye 2 (two) times daily.     timolol  (TIMOPTIC ) 0.5 % ophthalmic solution Place 1 drop into both eyes 2 (two) times daily.      Turmeric, Curcuma [place]a, (CURCUMIN) POWD by Does not apply route.     Vitamin D3 (VITAMIN D ) 25 MCG tablet Take 1 tablet by mouth 2 (two) times daily.     No current facility-administered medications for this visit.    Allergies as of 03/06/2024 - Review Complete 03/06/2024  Allergen Reaction Noted   Lipitor [atorvastatin] Other (See Comments) 07/13/2010   Lisinopril  02/15/2007   Morphine and codeine Other (See Comments) 11/12/2012   Levofloxacin Rash 01/11/2010   Septra [sulfamethoxazole-trimethoprim] Rash 11/12/2012   Sulfamethoxazole Rash 04/08/2013   Sulfamethoxazole-trimethoprim Rash 06/21/2020    Family History  Problem Relation Age of Onset   Hypertension Mother    Diabetes Father    Hypertension Father    Colon polyps Brother    Coronary artery disease Neg Hx     Cancer Neg Hx    Esophageal cancer Neg Hx    Stomach cancer Neg Hx    Rectal cancer Neg Hx     Social History   Tobacco Use   Smoking status: Never    Passive exposure: Never   Smokeless tobacco: Never  Substance Use Topics   Alcohol use: No   Drug use: No  Review of Systems:    Constitutional: No fever, chills Cardiovascular: No chest pain Respiratory: No SOB  Gastrointestinal: See HPI and otherwise negative Hematologic: BRBPR    Physical Exam:  Vital signs: BP 130/70   Pulse 82   Ht 5' 6 (1.676 m)   Wt 143 lb (64.9 kg)   BMI 23.08 kg/m   Constitutional: Pleasant elderly male in NAD, alert and cooperative Head:  Normocephalic and atraumatic.  Eyes: No scleral icterus.  Respiratory: Respirations even and unlabored. Lungs clear to auscultation bilaterally.  No wheezes, crackles, or rhonchi.  Cardiovascular:  Regular rate and rhythm. No murmurs. No peripheral edema. Gastrointestinal:  Soft, nondistended, nontender. No rebound or guarding. Normal bowel sounds. No appreciable masses or hepatomegaly. Rectal: Small nonbleeding, nonthrombosed external hemorrhoids.  No anal fissures seen.  Brown stool in rectum.  On anoscopy internal hemorrhoids are seen with evidence of recent bleeding.  Chaperone present for exam. Neurologic:  Alert and oriented x4;  grossly normal neurologically.  Skin:   Dry and intact without significant lesions or rashes. Psychiatric: Oriented to person, place and time. Demonstrates good judgement and reason without abnormal affect or behaviors.   RELEVANT LABS AND IMAGING: CBC    Component Value Date/Time   WBC 5.8 12/19/2023 0959   RBC 5.42 12/19/2023 0959   HGB 15.5 12/19/2023 0959   HGB 10.8 (L) 11/21/2019 1037   HCT 47.0 12/19/2023 0959   HCT 35.0 (L) 11/21/2019 1037   PLT 329.0 12/19/2023 0959   PLT 387 11/21/2019 1037   MCV 86.7 12/19/2023 0959   MCV 74 (L) 11/21/2019 1037   MCH 28.4 12/14/2023 1022   MCHC 32.9 12/19/2023 0959    RDW 16.1 (H) 12/19/2023 0959   RDW 19.1 (H) 11/21/2019 1037   LYMPHSABS 0.9 12/22/2021 1437   MONOABS 0.5 12/22/2021 1437   EOSABS 0.1 12/22/2021 1437   BASOSABS 0.1 12/22/2021 1437    CMP     Component Value Date/Time   NA 140 12/19/2023 0959   NA 139 11/21/2019 1037   K 3.8 12/19/2023 0959   CL 103 12/19/2023 0959   CO2 31 12/19/2023 0959   GLUCOSE 147 (H) 12/19/2023 0959   BUN 22 12/19/2023 0959   BUN 21 11/21/2019 1037   CREATININE 1.15 12/19/2023 0959   CREATININE 1.16 11/08/2020 1352   CALCIUM  9.3 12/19/2023 0959   PROT 6.8 12/14/2023 1022   ALBUMIN  4.3 12/19/2023 0959   AST 14 (L) 12/14/2023 1022   AST 17 11/08/2020 1352   ALT 14 12/14/2023 1022   ALT 21 11/08/2020 1352   ALKPHOS 45 12/14/2023 1022   BILITOT 0.7 12/14/2023 1022   BILITOT 0.3 11/08/2020 1352   GFRNONAA >60 12/15/2023 0249   GFRNONAA >60 11/08/2020 1352   GFRAA 49 (L) 01/06/2020 1001   Echocardiogram 12/15/2023 1. Left ventricular ejection fraction, by estimation, is 45 to 50% . The left ventricle has mildly decreased function. The left ventricle demonstrates global hypokinesis. Left ventricular diastolic parameters are consistent with Grade I diastolic dysfunction ( impaired relaxation) . Elevated left atrial pressure.  2. Right ventricular systolic function is normal. The right ventricular size is normal. There is normal pulmonary artery systolic pressure.  3. The mitral valve is normal in structure. No evidence of mitral valve regurgitation. No evidence of mitral stenosis.  4. The tricuspid valve is abnormal.  5. The aortic valve is tricuspid. There is moderate calcification of the aortic valve. There is moderate thickening of the aortic valve. Aortic valve regurgitation is  mild. No aortic stenosis is present.  6. The inferior vena cava is normal in size with greater than 50% respiratory variability, suggesting right atrial pressure of 3 mmHg.  Assessment/Plan:   Rectal bleeding Internal  hemorrhoids Constipation Iron deficiency anemia History of recent stroke and paroxysmal A-fib, on chronic anticoagulation Patient seen today for evaluation of rectal bleeding.  Has history of recent stroke and initially was on aspirin  and Plavix .  1 to 2 months ago was started on Eliquis .  He reports history of intermittent BRBPR over the last 4 to 5 years, but since starting Eliquis  has noticed increased bleeding per rectum.  On exam he has internal hemorrhoids with evidence of recent bleeding.  Denies any blood in his bowel movement this morning. He endorses constipation for which he is taking Dulcolax 2-3 times a week.  Has previously tried stool softeners which he says caused diarrhea. Last colonoscopy 2021 with finding of diverticulosis and otherwise normal with no recall recommended.  - Check labs today: CBC, CMP - Will send Anusol  suppositories, to use once at bedtime for 7 to 10 days - Start MiraLAX  1 capful daily and adjust dose as needed for management of constipation. - If bleeding persists consider possible banding - Advised patient that if he has significant/worsening rectal bleeding he should go to the ED. - He did not take his Eliquis  today due to having this appointment.  Advised him to resume taking as prescribed.   Camie Furbish, PA-C [facility] 03/06/2024, 11:20 AM  Patient Care Team: Jimmy Charlie FERNS, MD as PCP - General Bensimhon, Toribio SAUNDERS, MD as PCP - Advanced Heart Failure (Cardiology) Jeffrie Oneil BROCKS, MD as PCP - Cardiology (Cardiology) Fate Morna SAILOR, [facility] (Inactive) as Pharmacist (Pharmacist)

## 2024-03-07 ENCOUNTER — Other Ambulatory Visit: Payer: Self-pay | Admitting: Pharmacist

## 2024-03-07 MED ORDER — EZETIMIBE 10 MG PO TABS: 10.0000 mg | ORAL_TABLET | Freq: Every day | ORAL | 3 refills | Status: AC

## 2024-03-08 ENCOUNTER — Other Ambulatory Visit: Payer: Self-pay | Admitting: Internal Medicine

## 2024-03-08 DIAGNOSIS — I5022 Chronic systolic (congestive) heart failure: Secondary | ICD-10-CM

## 2024-03-11 NOTE — Progress Notes (Signed)
 ____________________________________________________________  Attending physician addendum:  Thank you for sending this case to me. I have reviewed the entire note and agree with the plan.   Victory Brand, MD  ____________________________________________________________

## 2024-03-13 ENCOUNTER — Ambulatory Visit: Admitting: Podiatry

## 2024-03-13 ENCOUNTER — Encounter: Payer: Self-pay | Admitting: Podiatry

## 2024-03-13 DIAGNOSIS — Q828 Other specified congenital malformations of skin: Secondary | ICD-10-CM | POA: Diagnosis not present

## 2024-03-13 DIAGNOSIS — E1142 Type 2 diabetes mellitus with diabetic polyneuropathy: Secondary | ICD-10-CM

## 2024-03-13 DIAGNOSIS — B351 Tinea unguium: Secondary | ICD-10-CM

## 2024-03-13 DIAGNOSIS — M79676 Pain in unspecified toe(s): Secondary | ICD-10-CM | POA: Diagnosis not present

## 2024-03-14 NOTE — Telephone Encounter (Signed)
 PA approved.

## 2024-03-15 ENCOUNTER — Encounter: Payer: Self-pay | Admitting: Podiatry

## 2024-03-15 NOTE — Progress Notes (Signed)
  Subjective:  Patient ID: James Braun, male    DOB: 07-Aug-1940,  MRN: [id number]  James Braun presents to clinic today for at risk foot care with history of diabetic neuropathy and painful porokeratotic lesion(s) of both feet and painful mycotic toenails that limit ambulation. Painful toenails interfere with ambulation. Aggravating factors include wearing enclosed shoe gear. Pain is relieved with periodic professional debridement. Painful porokeratotic lesions are aggravated when weightbearing with and without shoegear. Pain is relieved with periodic professional debridement.  Chief Complaint  Patient presents with   [facility]    Rm4 Diabetic foot care/ Dr. Jimmy last visit August 23.2025/ A1C 7.4   New problem(s): None.   PCP is Jimmy Charlie FERNS, MD.  Allergies  Allergen Reactions   Lipitor [Atorvastatin] Other (See Comments)    myalgias   Lisinopril    Morphine And Codeine Other (See Comments)     hypotension   Levofloxacin Rash   Septra [Sulfamethoxazole-Trimethoprim] Rash   Sulfamethoxazole Rash   Sulfamethoxazole-Trimethoprim Rash    Review of Systems: Negative except as noted in the HPI.  Objective: No changes noted in today's physical examination. There were no vitals filed for this visit. James Braun is a pleasant 83 y.o. male WD, WN in NAD. AAO x 3.  Vascular Examination: Capillary refill time immediate b/l. Palpable pedal pulses. Pedal hair present b/l. No pain with calf compression b/l. Skin temperature gradient WNL b/l. No cyanosis or clubbing b/l. No ischemia or gangrene noted b/l. No edema noted b/l LE.  Neurological Examination: Sensation grossly intact b/l with 10 gram monofilament. Vibratory sensation intact b/l. Pt has subjective symptoms of neuropathy.  Dermatological Examination: Pedal skin with normal turgor, texture and tone b/l.  No open wounds. No interdigital macerations.   Toenails 1-5 b/l thick, discolored, elongated with subungual debris and  pain on dorsal palpation.   Porokeratotic lesion(s) submet head 5 b/l. No erythema, no edema, no drainage, no fluctuance.  Musculoskeletal Examination: Muscle strength 5/5 to all lower extremity muscle groups bilaterally. Plantarflexed metatarsal(s) 5th metatarsal head b/l lower extremities. HAV with bunion bilaterally and hammertoes 2-5 b/l.  Radiographs: None Assessment/Plan: 1. Pain due to onychomycosis of toenail   2. Porokeratosis   3. Diabetic polyneuropathy associated with type 2 diabetes mellitus [facility])   Consent given for treatment. Patient examined. All patient's and/or POA's questions/concerns addressed on today's visit. Toenails 1-5 debrided in length and girth without incident. Porokeratotic lesion(s) submet head 5 left foot and submet head 5 right foot pared and enucleated with sharp debridement without incident. Continue foot and shoe inspections daily. Monitor blood glucose per PCP/Endocrinologist's recommendations.Continue soft, supportive shoe gear daily. Report any pedal injuries to medical professional. Call office if there are any questions/concerns. Return in about 3 months (around 06/13/2024).  Delon LITTIE Merlin, DPM      [place] LOCATION: [address], [place] [address]                   Office [phone]   [facility] LOCATION: [address] [place], [place] [address] Office [phone]

## 2024-03-26 ENCOUNTER — Other Ambulatory Visit

## 2024-03-26 DIAGNOSIS — I5022 Chronic systolic (congestive) heart failure: Secondary | ICD-10-CM

## 2024-03-26 LAB — RENAL FUNCTION PANEL
Albumin: 4.5 g/dL (ref 3.5–5.2)
BUN: 24 mg/dL — ABNORMAL HIGH (ref 6–23)
CO2: 28 meq/L (ref 19–32)
Calcium: 9.4 mg/dL (ref 8.4–10.5)
Chloride: 104 meq/L (ref 96–112)
Creatinine, Ser: 1.09 mg/dL (ref 0.40–1.50)
GFR: 62.52 mL/min (ref >60.00)
Glucose, Bld: 161 mg/dL — ABNORMAL HIGH (ref 70–99)
Phosphorus: 3.4 mg/dL (ref 2.3–4.6)
Potassium: 3.6 meq/L (ref 3.5–5.1)
Sodium: 141 meq/L (ref 135–145)

## 2024-04-18 DIAGNOSIS — Z1283 Encounter for screening for malignant neoplasm of skin: Secondary | ICD-10-CM | POA: Diagnosis not present

## 2024-04-18 DIAGNOSIS — L820 Inflamed seborrheic keratosis: Secondary | ICD-10-CM | POA: Diagnosis not present

## 2024-04-18 DIAGNOSIS — Z08 Encounter for follow-up examination after completed treatment for malignant neoplasm: Secondary | ICD-10-CM | POA: Diagnosis not present

## 2024-04-18 DIAGNOSIS — L821 Other seborrheic keratosis: Secondary | ICD-10-CM | POA: Diagnosis not present

## 2024-04-18 DIAGNOSIS — X32XXXD Exposure to sunlight, subsequent encounter: Secondary | ICD-10-CM | POA: Diagnosis not present

## 2024-04-18 DIAGNOSIS — D225 Melanocytic nevi of trunk: Secondary | ICD-10-CM | POA: Diagnosis not present

## 2024-04-18 DIAGNOSIS — Z85820 Personal history of malignant melanoma of skin: Secondary | ICD-10-CM | POA: Diagnosis not present

## 2024-04-18 DIAGNOSIS — L814 Other melanin hyperpigmentation: Secondary | ICD-10-CM | POA: Diagnosis not present

## 2024-04-18 DIAGNOSIS — L570 Actinic keratosis: Secondary | ICD-10-CM | POA: Diagnosis not present

## 2024-04-29 ENCOUNTER — Other Ambulatory Visit: Payer: Self-pay | Admitting: *Deleted

## 2024-04-29 ENCOUNTER — Encounter: Payer: Self-pay | Admitting: Gastroenterology

## 2024-04-29 ENCOUNTER — Ambulatory Visit: Admitting: Gastroenterology

## 2024-04-29 VITALS — BP 136/84 | HR 99 | Ht 66.0 in | Wt 145.0 lb

## 2024-04-29 DIAGNOSIS — K648 Other hemorrhoids: Secondary | ICD-10-CM

## 2024-04-29 DIAGNOSIS — K649 Unspecified hemorrhoids: Secondary | ICD-10-CM | POA: Diagnosis not present

## 2024-04-29 DIAGNOSIS — K5909 Other constipation: Secondary | ICD-10-CM

## 2024-04-29 MED ORDER — APIXABAN 5 MG PO TABS: 5.0000 mg | ORAL_TABLET | Freq: Two times a day (BID) | ORAL | 3 refills | Status: DC

## 2024-04-29 MED ORDER — HYDROCORTISONE ACETATE 25 MG RE SUPP: 25.0000 mg | Freq: Every day | RECTAL | 1 refills | Status: AC | PRN

## 2024-04-29 NOTE — Patient Instructions (Signed)
 We have sent the following medications to your pharmacy for you to pick up at your convenience: Anusol  suppositories   _______________________________________________________  If your blood pressure at your visit was 140/90 or greater, please contact your primary care physician to follow up on this.  _______________________________________________________  If you are age 84 or older, your body mass index should be between 23-30. Your Body mass index is 23.4 kg/m. If this is out of the aforementioned range listed, please consider follow up with your Primary Care Provider.  If you are age 76 or younger, your body mass index should be between 19-25. Your Body mass index is 23.4 kg/m. If this is out of the aformentioned range listed, please consider follow up with your Primary Care Provider.   ________________________________________________________  The [facility] providers would like to encourage you to use MYCHART to communicate with providers for non-urgent requests or questions.  Due to long hold times on the telephone, sending your provider a message by [facility] may be a faster and more efficient way to get a response.  Please allow 48 business hours for a response.  Please remember that this is for non-urgent requests.  _______________________________________________________  [facility] is using a team-based approach to care.  Your team is made up of your doctor and two to three APPS. Our APPS (Nurse Practitioners and Physician Assistants) work with your physician to ensure care continuity for you. They are fully qualified to address your health concerns and develop a treatment plan. They communicate directly with your gastroenterologist to care for you. Seeing the Advanced Practice Practitioners on your physician's team can help you by facilitating care more promptly, often allowing for earlier appointments, access to diagnostic testing, procedures, and other specialty referrals.     Thank you for trusting me with your gastrointestinal care!    Dr. Victory Legrand DOUGLAS [facility]

## 2024-04-29 NOTE — Progress Notes (Signed)
 [facility] Progress Note  Chief Complaint: Constipation and hemorrhoidal bleeding  Summary of GI history:  Prior history outlined in 03/06/2024 APP office note.  On that occasion, was seeing us  for internal hemorrhoidal bleeding that had worsened in the months prior since starting Eliquis  for A-fib discovered after a CVA earlier this year.  Subjective  HPI:  James Braun was glad to report that things have improved since his most recent visit.  With increased water and fiber consumption and elevating his feet while toileting his constipation has improved.  He started using a hydrocortisone  hemorrhoid suppository which worked well for about 2 weeks after just one use, then with some recurrent bleeding he used another.  He feels like at this point he would like to do that every 2 weeks to keep bleeding under control.  ROS: Cardiovascular:  no chest pain Respiratory: no dyspnea  The patient's Past Medical, Family and Social History were reviewed and are on file in the EMR. Past Medical History:  Diagnosis Date   BPH (benign prostatic hypertrophy)    Cancer (HCC)    melanoma   Coronary artery disease    History of kidney stones    History of melanoma excision OCT 2010   Hyperlipidemia    Hypertension    Left ureteral calculus    Nephrolithiasis    bilateral   Type 2 diabetes mellitus (HCC)     Past Surgical History:  Procedure Laterality Date   CARDIAC CATHETERIZATION     CHOLECYSTECTOMY  01/1999   CORNEAL TRANSPLANT Right 2023   CORONARY ARTERY BYPASS GRAFT N/A 11/28/2019   Procedure: CORONARY ARTERY BYPASS GRAFTING (CABG) x3, using left internal mammary artery and Saphenous vein harvested endoscopically;  Surgeon: Fleeta Hanford Coy, MD;  Location: [facility] OR;  Service: Open Heart Surgery;  Laterality: N/A;   CYSTOSCOPY WITH STENT PLACEMENT Left 11/14/2012   Procedure: CYSTOSCOPY WITH STENT PLACEMENT;  Surgeon: Garnette Shack, MD;  Location: [facility];   Service: Urology;  Laterality: Left;   CYSTOSCOPY/RETROGRADE/URETEROSCOPY/STONE EXTRACTION WITH BASKET Left 11/14/2012   Procedure: CYSTOSCOPY/RETROGRADE/URETEROSCOPY/STONE EXTRACTION WITH BASKET;  Surgeon: Garnette Shack, MD;  Location: [facility];  Service: Urology;  Laterality: Left;   EXTRACORPOREAL SHOCK WAVE LITHOTRIPSY  01/1999   X2   HOLMIUM LASER APPLICATION Left 11/14/2012   Procedure: HOLMIUM LASER APPLICATION;  Surgeon: Garnette Shack, MD;  Location: [facility];  Service: Urology;  Laterality: Left;   LEFT HEART CATH AND CORONARY ANGIOGRAPHY N/A 11/27/2019   Procedure: LEFT HEART CATH AND CORONARY ANGIOGRAPHY;  Surgeon: Court Dorn PARAS, MD;  Location: [facility];  Service: Cardiovascular;  Laterality: N/A;   LEFT URETEROSCOPIC STONE EXTRACTION  02/22/2007   NEPHROLITHOTOMY Right 09/28/2010   percutaneous   ORIF CLAVICLE FRACTURE  2006   PROSTATE BIOPSY  1998   neg   SHOULDER ARTHROSCOPY  07/2013   Dr Rubie   TEE WITHOUT CARDIOVERSION N/A 11/28/2019   Procedure: TRANSESOPHAGEAL ECHOCARDIOGRAM (TEE);  Surgeon: Fleeta Hanford, Coy, MD;  Location: [facility] OR;  Service: Open Heart Surgery;  Laterality: N/A;   TONSILLECTOMY      Objective:  Med list reviewed  Current Outpatient Medications:    Acetaminophen  (TYLENOL  8 HOUR ARTHRITIS PAIN PO), Take by mouth., Disp: , Rfl:    alum hydroxide-mag trisilicate (GAVISCON ) 80-20 MG CHEW chewable tablet, Chew 2 tablets by mouth 3 (three) times daily as needed for indigestion or heartburn., Disp: , Rfl:    apixaban  (ELIQUIS ) 5 MG TABS  tablet, Take 1 tablet (5 mg total) by mouth 2 (two) times daily., Disp: 60 tablet, Rfl: 3   Cyanocobalamin  (VITAMIN B-12) 1000 MCG SUBL, Place 1,000 mcg under the tongue. 1000 mcg SL daily, Disp: , Rfl:    empagliflozin  (JARDIANCE ) 10 MG TABS tablet, Take 1 tablet (10 mg total) by mouth daily before breakfast., Disp: 90 tablet, Rfl: 3   Evolocumab  (REPATHA  SURECLICK)  140 MG/ML SOAJ, Inject 140 mg into the skin every 14 (fourteen) days., Disp: 6 mL, Rfl: 3   ezetimibe  (ZETIA ) 10 MG tablet, Take 1 tablet (10 mg total) by mouth daily., Disp: 90 tablet, Rfl: 3   ferrous sulfate 325 (65 FE) MG tablet, Take 325 mg by mouth daily with breakfast., Disp: , Rfl:    finasteride  (PROSCAR ) 5 MG tablet, Take 1 tablet (5 mg total) by mouth daily., Disp: 90 tablet, Rfl: 3   LUMIGAN 0.01 % SOLN, Place 1 drop into the left eye daily., Disp: , Rfl:    metFORMIN  (GLUCOPHAGE -XR) 500 MG 24 hr tablet, Take 1 tablet (500 mg total) by mouth 2 (two) times daily., Disp: 180 tablet, Rfl: 1   Multiple Vitamin (MULTIVITAMIN) tablet, Take 1 tablet by mouth daily., Disp: , Rfl:    Nutritional Supplements (NUTRITIONAL SUPPLEMENT PO), Take by mouth. Curamin, Disp: , Rfl:    omeprazole  (PRILOSEC) 10 MG capsule, Take 10 mg by mouth daily., Disp: , Rfl:    OVER THE COUNTER MEDICATION, HA Joint Formula: Vit C, Magnesium , Collagen, Quercetin, Olive Extract, Boswellia, Disp: , Rfl:    OVER THE COUNTER MEDICATION, Joint Supplement, Disp: , Rfl:    Potassium Citrate  15 MEQ (1620 MG) TBCR, Take 1 tablet by mouth 3 (three) times daily., Disp: 270 tablet, Rfl: 3   prednisoLONE acetate (PRED FORTE) 1 % ophthalmic suspension, Place 1 drop into the right eye daily., Disp: , Rfl:    rosuvastatin  (CRESTOR ) 5 MG tablet, Take 1 tablet (5 mg total) by mouth daily., Disp: 90 tablet, Rfl: 3   sacubitril -valsartan  (ENTRESTO ) 49-51 MG, Take 1 tablet by mouth 2 (two) times daily., Disp: 180 tablet, Rfl: 3   sildenafil  (REVATIO ) 20 MG tablet, TAKE 3-5 TABLETS BY MOUTH EVERY DAY AS NEEDED, Disp: 50 tablet, Rfl: 5   SIMBRINZA 1-0.2 % SUSP, Place 1 drop into the left eye 2 (two) times daily., Disp: , Rfl:    timolol  (TIMOPTIC ) 0.5 % ophthalmic solution, Place 1 drop into both eyes 2 (two) times daily. , Disp: , Rfl:    Turmeric, Curcuma Longa, (CURCUMIN) POWD, by Does not apply route., Disp: , Rfl:    Vitamin D3 (VITAMIN  D) 25 MCG tablet, Take 1 tablet by mouth 2 (two) times daily., Disp: , Rfl:    Vital signs in last 24 hrs: Vitals:   04/29/24 1526  BP: 136/84  Pulse: 99   Wt Readings from Last 3 Encounters:  04/29/24 145 lb (65.8 kg)  03/06/24 143 lb (64.9 kg)  02/15/24 147 lb 9.6 oz (67 kg)    Physical Exam  No additional exam today.  Entire visit spent in review symptoms results and plan  Labs:     Latest Ref Rng & Units 03/06/2024   11:42 AM 12/19/2023    9:59 AM 12/14/2023   10:22 AM  CBC  WBC 4.0 - 10.5 K/uL 5.4  5.8  6.2   Hemoglobin 13.0 - 17.0 g/dL 84.9  84.4  84.6   Hematocrit 39.0 - 52.0 % 45.7  47.0  48.1   Platelets  150.0 - 400.0 K/uL 360.0  329.0  344       Latest Ref Rng & Units 03/26/2024    9:41 AM 03/06/2024   11:42 AM 12/19/2023    9:59 AM  CMP  Glucose 70 - 99 mg/dL 838  859  852   BUN 6 - 23 mg/dL 24  33  22   Creatinine 0.40 - 1.50 mg/dL 8.90  8.48  8.84   Sodium 135 - 145 mEq/L 141  142  140   Potassium 3.5 - 5.1 mEq/L 3.6  4.2  3.8   Chloride 96 - 112 mEq/L 104  104  103   CO2 19 - 32 mEq/L 28  29  31    Calcium  8.4 - 10.5 mg/dL 9.4  9.3  9.3   Total Protein 6.0 - 8.3 g/dL  7.2    Total Bilirubin 0.2 - 1.2 mg/dL  0.6    Alkaline Phos 39 - 117 U/L  53    AST 0 - 37 U/L  10    ALT 0 - 53 U/L  10     After this elevated creatinine was discovered, he increase his fluid intake and had a repeat BMP with primary care showing improvement. ___________________________________________ Radiologic studies:   ____________________________________________ Other:   _____________________________________________ Assessment & Plan  Assessment: Encounter Diagnoses  Name Primary?   Bleeding internal hemorrhoids Yes   Chronic constipation    Improved from both the standpoint of his constipation and hemorrhoidal bleeding.  He would like a refill of the hydrocortisone  suppositories which we will send to his pharmacy.  I told him that if these become cost prohibitive at any  point, an over-the-counter Preparation H phenylephrine  suppository might be equally effective.  If he has persistent bleeding despite maintenance of regularity and use of the suppositories, then hemorrhoidal banding would be an option.  I briefly discussed that and showed of some diagrams of the anatomy.  He is pleased with the status of things now and will call me as needed.  20 minutes were spent on this encounter (including chart review, history/exam, counseling/coordination of care, and documentation) > 50% of that time was spent on counseling and coordination of care.   Victory LITTIE Brand III

## 2024-05-01 DIAGNOSIS — H18513 Endothelial corneal dystrophy, bilateral: Secondary | ICD-10-CM | POA: Diagnosis not present

## 2024-05-01 DIAGNOSIS — H353131 Nonexudative age-related macular degeneration, bilateral, early dry stage: Secondary | ICD-10-CM | POA: Diagnosis not present

## 2024-05-01 DIAGNOSIS — H2512 Age-related nuclear cataract, left eye: Secondary | ICD-10-CM | POA: Diagnosis not present

## 2024-05-01 DIAGNOSIS — H401122 Primary open-angle glaucoma, left eye, moderate stage: Secondary | ICD-10-CM | POA: Diagnosis not present

## 2024-05-01 LAB — LIPID PANEL
Chol/HDL Ratio: 1.4 ratio (ref 0.0–5.0)
Cholesterol, Total: 63 mg/dL — ABNORMAL LOW (ref 100–199)
HDL: 44 mg/dL (ref >39)
LDL Chol Calc (NIH): 3 mg/dL (ref 0–99)
Triglycerides: 69 mg/dL (ref 0–149)
VLDL Cholesterol Cal: 16 mg/dL (ref 5–40)

## 2024-05-02 ENCOUNTER — Ambulatory Visit: Payer: Self-pay | Admitting: Pharmacist

## 2024-05-02 LAB — HEPATIC FUNCTION PANEL
ALT: 11 IU/L (ref 0–44)
AST: 10 IU/L (ref 0–40)
Albumin: 4.4 g/dL (ref 3.7–4.7)
Alkaline Phosphatase: 64 IU/L (ref 48–129)
Bilirubin Total: 0.5 mg/dL (ref 0.0–1.2)
Bilirubin, Direct: 0.25 mg/dL (ref 0.00–0.40)
Total Protein: 6.5 g/dL (ref 6.0–8.5)

## 2024-05-05 ENCOUNTER — Telehealth: Payer: Self-pay | Admitting: Internal Medicine

## 2024-05-05 NOTE — Telephone Encounter (Signed)
 Prescription Request  05/05/2024  LOV: 02/05/2024  What is the name of the medication or equipment? OMEPRazole  DR 20 mg   Have you contacted your pharmacy to request a refill? Yes   Which pharmacy would you like this sent to?  [id number] [place], [place] - [address] [place] [address] Phone: [phone] Fax: [phone]   Patient notified that their request is being sent to the clinical staff for review and that they should receive a response within 2 business days.   Please advise [phone] cell    Patient is out of medication

## 2024-05-12 MED ORDER — POTASSIUM CITRATE ER 15 MEQ (1620 MG) PO TBCR: 1.0000 | EXTENDED_RELEASE_TABLET | Freq: Three times a day (TID) | ORAL | 0 refills | Status: AC

## 2024-05-12 NOTE — Addendum Note (Signed)
 Addended by: KALLIE CLOTILDA SQUIBB on: 05/12/2024 04:43 PM   Modules accepted: Orders

## 2024-05-19 ENCOUNTER — Ambulatory Visit: Admitting: Podiatry

## 2024-05-19 ENCOUNTER — Encounter: Payer: Self-pay | Admitting: Podiatry

## 2024-05-19 DIAGNOSIS — Q828 Other specified congenital malformations of skin: Secondary | ICD-10-CM | POA: Diagnosis not present

## 2024-05-19 DIAGNOSIS — E1142 Type 2 diabetes mellitus with diabetic polyneuropathy: Secondary | ICD-10-CM

## 2024-05-19 DIAGNOSIS — M79676 Pain in unspecified toe(s): Secondary | ICD-10-CM | POA: Diagnosis not present

## 2024-05-19 DIAGNOSIS — B351 Tinea unguium: Secondary | ICD-10-CM | POA: Diagnosis not present

## 2024-05-19 NOTE — Progress Notes (Unsigned)
 Subjective:  Patient ID: James Braun, male    DOB: 01-09-1941,  MRN: [id number]  James Braun presents to clinic today for: at risk foot care with history of diabetic neuropathy and painful porokeratotic lesion(s) b/l feet and painful mycotic toenails that limit ambulation. Painful toenails interfere with ambulation. Aggravating factors include wearing enclosed shoe gear. Pain is relieved with periodic professional debridement. Painful porokeratotic lesions are aggravated when weightbearing with and without shoegear. Pain is relieved with periodic professional debridement. Patient has diabetic shoes. Chief Complaint  Patient presents with   Toe Pain    A1c 7.6. Last visit with Dr. Jimmy was in July but he is retiring.     PCP is Jimmy Charlie FERNS, MD.  Allergies  Allergen Reactions   Lipitor [Atorvastatin] Other (See Comments)    myalgias   Lisinopril    Morphine And Codeine Other (See Comments)     hypotension   Levofloxacin Rash   Septra [Sulfamethoxazole-Trimethoprim] Rash   Sulfamethoxazole Rash   Sulfamethoxazole-Trimethoprim Rash    Review of Systems: Negative except as noted in the HPI.  Objective:  There were no vitals filed for this visit.  James Braun is a pleasant 83 y.o. male WD, WN in NAD. AAO x 3.  Vascular Examination: Capillary refill time <3 seconds b/l LE. Palpable pedal pulses b/l LE. Digital hair present b/l. No pedal edema b/l. Skin temperature gradient WNL b/l. No varicosities b/l. No cyanosis or clubbing. No ischemia or gangrene. .  Dermatological Examination: Pedal skin with normal turgor, texture and tone b/l. No open wounds. No interdigital macerations b/l. Toenails 1-5 b/l thickened, discolored, dystrophic with subungual debris. There is pain on palpation to dorsal aspect of nailplates. No hyperkeratotic nor porokeratotic lesions. No corns, calluses, nor porokeratotic lesions. Porokeratotic lesion(s) submet head 1 left foot and submet head 5  b/l. No erythema, no edema, no drainage, no fluctuance.  Neurological Examination: Protective sensation intact with 10 gram monofilament b/l LE. Vibratory sensation intact b/l LE. Pt has subjective symptoms of neuropathy.  Musculoskeletal Examination: Muscle strength 5/5 to all lower extremity muscle groups bilaterally. Plantarflexed metatarsal(s) 5th metatarsal head b/l feet. HAV with bunion deformity noted b/l LE. Patient ambulates independent of any assistive aids.     Latest Ref Rng & Units 12/15/2023    2:49 AM 08/06/2023   10:40 AM  Hemoglobin A1C  Hemoglobin-A1c 4.8 - 5.6 % 7.6  7.4    Assessment/Plan: 1. Pain due to onychomycosis of toenail   2. Porokeratosis   3. Diabetic polyneuropathy associated with type 2 diabetes mellitus (HCC)   Patient was evaluated and treated. All patient's and/or POA's questions/concerns addressed on today's visit. Mycotic toenails 1-5 b/l debrided in length and girth without incident. Porokeratotic lesion(s) submet head 1 left foot and submet head 5 b/l pared with sharp debridement without incident. Continue daily foot inspections and monitor blood glucose per PCP/Endocrinologist's recommendations. Continue soft, supportive shoe gear daily. Report any pedal injuries to medical professional. Call office if there are any questions/concerns.  Return in about 3 months (around 08/19/2024).  James Braun, DPM      [place] LOCATION: [address], [place] [address]  Office [phone]   [facility] LOCATION: [address] [place], [place] [address] Office [phone]

## 2024-06-16 ENCOUNTER — Ambulatory Visit: Admitting: Physician Assistant

## 2024-06-18 DIAGNOSIS — L304 Erythema intertrigo: Secondary | ICD-10-CM | POA: Diagnosis not present

## 2024-07-14 ENCOUNTER — Other Ambulatory Visit: Payer: Self-pay | Admitting: Family Medicine

## 2024-07-14 NOTE — Telephone Encounter (Unsigned)
 Copied from CRM [id number]. Topic: Clinical - Medication Refill >> Jul 14, 2024  9:52 AM James Braun wrote: Medication: empagliflozin  (JARDIANCE ) 10 MG TABS tablet metFORMIN  (GLUCOPHAGE -XR) 500 MG 24 hr tablet   Has the patient contacted their pharmacy? Yes (Agent: If no, request that the patient contact the pharmacy for the refill. If patient does not wish to contact the pharmacy document the reason why and proceed with request.) (Agent: If yes, when and what did the pharmacy advise?)  This is the patient's preferred pharmacy:  [facility] [employer] [facility], [place] - [id number] [place] [address] [place] [id number] Phone: [phone] Fax: [phone]   Is this the correct pharmacy for this prescription? Yes If no, delete pharmacy and type the correct one.   Has the prescription been filled recently? Yes  Is the patient out of the medication? Yes  Has the patient been seen for an appointment in the last year OR does the patient have an upcoming appointment? Yes  Can we respond through MyChart? Yes  Agent: Please be advised that Rx refills may take up to 3 business days. We ask that you follow-up with your pharmacy.

## 2024-07-16 MED ORDER — EMPAGLIFLOZIN 10 MG PO TABS: 10.0000 mg | ORAL_TABLET | Freq: Every day | ORAL | 1 refills | Status: AC

## 2024-07-16 MED ORDER — METFORMIN HCL ER 500 MG PO TB24: 500.0000 mg | ORAL_TABLET | Freq: Two times a day (BID) | ORAL | 1 refills | Status: AC

## 2024-07-16 NOTE — Telephone Encounter (Signed)
 ERx

## 2024-08-16 ENCOUNTER — Other Ambulatory Visit: Payer: Self-pay | Admitting: Family

## 2024-08-20 NOTE — Progress Notes (Signed)
 James Braun                                          MRN: [id number]   08/20/2024   The [facility] Quality Team Specialist reviewed this patient medical record for the purposes of chart review for care gap closure. The following were reviewed: chart review for care gap closure-kidney health evaluation for diabetes:eGFR  and uACR.    [facility]I Quality Team

## 2024-08-22 ENCOUNTER — Encounter: Payer: Self-pay | Admitting: Hematology

## 2024-08-25 ENCOUNTER — Encounter: Admitting: Family Medicine

## 2024-08-28 ENCOUNTER — Ambulatory Visit: Admitting: Podiatry
